# Patient Record
Sex: Female | Born: 1955
Health system: Southern US, Community
[De-identification: ages and names within clinical notes are randomized; demographics above are authoritative.]

## PROBLEM LIST (undated history)

## (undated) DIAGNOSIS — S93401D Sprain of unspecified ligament of right ankle, subsequent encounter: Secondary | ICD-10-CM

## (undated) DIAGNOSIS — I480 Paroxysmal atrial fibrillation: Secondary | ICD-10-CM

## (undated) DIAGNOSIS — K219 Gastro-esophageal reflux disease without esophagitis: Secondary | ICD-10-CM

## (undated) DIAGNOSIS — M549 Dorsalgia, unspecified: Secondary | ICD-10-CM

## (undated) DIAGNOSIS — E559 Vitamin D deficiency, unspecified: Secondary | ICD-10-CM

## (undated) DIAGNOSIS — R070 Pain in throat: Secondary | ICD-10-CM

## (undated) DIAGNOSIS — J329 Chronic sinusitis, unspecified: Secondary | ICD-10-CM

## (undated) DIAGNOSIS — J9801 Acute bronchospasm: Secondary | ICD-10-CM

## (undated) DIAGNOSIS — R030 Elevated blood-pressure reading, without diagnosis of hypertension: Secondary | ICD-10-CM

## (undated) DIAGNOSIS — R5383 Other fatigue: Secondary | ICD-10-CM

## (undated) DIAGNOSIS — Z780 Asymptomatic menopausal state: Secondary | ICD-10-CM

## (undated) DIAGNOSIS — R059 Cough, unspecified: Secondary | ICD-10-CM

## (undated) DIAGNOSIS — K589 Irritable bowel syndrome without diarrhea: Secondary | ICD-10-CM

## (undated) DIAGNOSIS — R1012 Left upper quadrant pain: Secondary | ICD-10-CM

## (undated) DIAGNOSIS — Z6823 Body mass index (BMI) 23.0-23.9, adult: Secondary | ICD-10-CM

## (undated) DIAGNOSIS — N840 Polyp of corpus uteri: Secondary | ICD-10-CM

## (undated) DIAGNOSIS — R05 Cough: Secondary | ICD-10-CM

## (undated) DIAGNOSIS — F419 Anxiety disorder, unspecified: Secondary | ICD-10-CM

## (undated) DIAGNOSIS — H9209 Otalgia, unspecified ear: Secondary | ICD-10-CM

## (undated) DIAGNOSIS — I499 Cardiac arrhythmia, unspecified: Secondary | ICD-10-CM

## (undated) DIAGNOSIS — R002 Palpitations: Secondary | ICD-10-CM

## (undated) DIAGNOSIS — J189 Pneumonia, unspecified organism: Secondary | ICD-10-CM

## (undated) DIAGNOSIS — M542 Cervicalgia: Secondary | ICD-10-CM

## (undated) DIAGNOSIS — K9049 Malabsorption due to intolerance, not elsewhere classified: Secondary | ICD-10-CM

## (undated) DIAGNOSIS — Z8719 Personal history of other diseases of the digestive system: Secondary | ICD-10-CM

## (undated) DIAGNOSIS — N340 Urethral abscess: Secondary | ICD-10-CM

## (undated) DIAGNOSIS — M940 Chondrocostal junction syndrome [Tietze]: Secondary | ICD-10-CM

## (undated) DIAGNOSIS — H269 Unspecified cataract: Secondary | ICD-10-CM

## (undated) DIAGNOSIS — K635 Polyp of colon: Secondary | ICD-10-CM

## (undated) DIAGNOSIS — R197 Diarrhea, unspecified: Secondary | ICD-10-CM

## (undated) DIAGNOSIS — N390 Urinary tract infection, site not specified: Secondary | ICD-10-CM

## (undated) HISTORY — DX: Polyp of colon: K63.5

## (undated) HISTORY — DX: Sprain of unspecified ligament of right ankle, subsequent encounter: S93.401D

## (undated) HISTORY — DX: Irritable bowel syndrome, unspecified: K58.9

## (undated) HISTORY — DX: Other fatigue: R53.83

## (undated) HISTORY — DX: Unspecified cataract: H26.9

## (undated) HISTORY — DX: Body mass index (BMI) 23.0-23.9, adult: Z68.23

## (undated) HISTORY — DX: Diarrhea, unspecified: R19.7

## (undated) HISTORY — DX: Gastro-esophageal reflux disease without esophagitis: K21.9

## (undated) HISTORY — DX: Polyp of corpus uteri: N84.0

## (undated) HISTORY — DX: Palpitations: R00.2

## (undated) HISTORY — DX: Pain in throat: R07.0

## (undated) HISTORY — DX: Urethral abscess: N34.0

## (undated) HISTORY — DX: Left upper quadrant pain: R10.12

## (undated) HISTORY — DX: Otalgia, unspecified ear: H92.09

## (undated) HISTORY — DX: Cervicalgia: M54.2

## (undated) HISTORY — DX: Urinary tract infection, site not specified: N39.0

## (undated) HISTORY — DX: Chondrocostal junction syndrome (tietze): M94.0

## (undated) HISTORY — DX: Vitamin D deficiency, unspecified: E55.9

## (undated) HISTORY — DX: Dorsalgia, unspecified: M54.9

## (undated) HISTORY — PX: COLONOSCOPY: SHX174

## (undated) HISTORY — DX: Malabsorption due to intolerance, not elsewhere classified: K90.49

## (undated) HISTORY — DX: Paroxysmal atrial fibrillation: I48.0

## (undated) HISTORY — DX: Chronic sinusitis, unspecified: J32.9

## (undated) HISTORY — DX: Asymptomatic menopausal state: Z78.0

## (undated) HISTORY — DX: Cough, unspecified: R05.9

## (undated) HISTORY — DX: Acute bronchospasm: J98.01

## (undated) HISTORY — DX: Elevated blood-pressure reading, without diagnosis of hypertension: R03.0

## (undated) HISTORY — PX: UPPER GI ENDOSCOPY: SHX6162

---
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
## 1978-10-02 HISTORY — PX: GYNECOLOGIC CRYOSURGERY: SHX857

## 2005-10-02 HISTORY — PX: OTHER SURGICAL HISTORY: SHX169

## 2007-02-11 ENCOUNTER — Encounter: Admission: RE | Admit: 2007-02-11 | Discharge: 2007-05-12 | Payer: Self-pay | Admitting: Anesthesiology

## 2007-02-12 ENCOUNTER — Ambulatory Visit: Payer: Self-pay | Admitting: Anesthesiology

## 2007-02-22 ENCOUNTER — Encounter
Admission: RE | Admit: 2007-02-22 | Discharge: 2007-05-23 | Payer: Self-pay | Admitting: Physical Medicine & Rehabilitation

## 2007-02-26 ENCOUNTER — Ambulatory Visit: Payer: Self-pay | Admitting: Physical Medicine & Rehabilitation

## 2007-02-26 ENCOUNTER — Ambulatory Visit (HOSPITAL_COMMUNITY): Admission: RE | Admit: 2007-02-26 | Discharge: 2007-02-26 | Payer: Self-pay | Admitting: Anesthesiology

## 2007-05-03 DIAGNOSIS — N840 Polyp of corpus uteri: Secondary | ICD-10-CM

## 2007-05-03 HISTORY — DX: Polyp of corpus uteri: N84.0

## 2007-05-03 HISTORY — PX: DILATION AND CURETTAGE OF UTERUS: SHX78

## 2007-05-23 ENCOUNTER — Ambulatory Visit (HOSPITAL_COMMUNITY): Admission: RE | Admit: 2007-05-23 | Discharge: 2007-05-23 | Payer: Self-pay | Admitting: Obstetrics and Gynecology

## 2007-05-23 ENCOUNTER — Encounter (INDEPENDENT_AMBULATORY_CARE_PROVIDER_SITE_OTHER): Payer: Self-pay | Admitting: Obstetrics and Gynecology

## 2007-08-02 ENCOUNTER — Emergency Department (HOSPITAL_COMMUNITY): Admission: EM | Admit: 2007-08-02 | Discharge: 2007-08-03 | Payer: Self-pay | Admitting: Emergency Medicine

## 2007-10-03 DIAGNOSIS — N340 Urethral abscess: Secondary | ICD-10-CM

## 2007-10-03 HISTORY — DX: Urethral abscess: N34.0

## 2008-03-04 ENCOUNTER — Emergency Department (HOSPITAL_COMMUNITY): Admission: EM | Admit: 2008-03-04 | Discharge: 2008-03-04 | Payer: Self-pay | Admitting: Family Medicine

## 2008-05-14 ENCOUNTER — Encounter
Admission: RE | Admit: 2008-05-14 | Discharge: 2008-05-18 | Payer: Self-pay | Admitting: Physical Medicine & Rehabilitation

## 2008-05-18 ENCOUNTER — Ambulatory Visit: Payer: Self-pay | Admitting: Physical Medicine & Rehabilitation

## 2008-06-11 ENCOUNTER — Encounter
Admission: RE | Admit: 2008-06-11 | Discharge: 2008-09-09 | Payer: Self-pay | Admitting: Physical Medicine & Rehabilitation

## 2008-07-22 ENCOUNTER — Ambulatory Visit: Payer: Self-pay | Admitting: Family Medicine

## 2008-08-25 ENCOUNTER — Ambulatory Visit: Payer: Self-pay | Admitting: Family Medicine

## 2008-08-25 DIAGNOSIS — E785 Hyperlipidemia, unspecified: Secondary | ICD-10-CM

## 2008-08-25 DIAGNOSIS — F329 Major depressive disorder, single episode, unspecified: Secondary | ICD-10-CM

## 2008-08-25 DIAGNOSIS — F32A Depression, unspecified: Secondary | ICD-10-CM | POA: Insufficient documentation

## 2008-08-25 DIAGNOSIS — F3289 Other specified depressive episodes: Secondary | ICD-10-CM

## 2008-08-25 HISTORY — DX: Hyperlipidemia, unspecified: E78.5

## 2008-08-25 HISTORY — DX: Other specified depressive episodes: F32.89

## 2008-08-25 HISTORY — DX: Major depressive disorder, single episode, unspecified: F32.9

## 2008-08-26 ENCOUNTER — Encounter (INDEPENDENT_AMBULATORY_CARE_PROVIDER_SITE_OTHER): Payer: Self-pay | Admitting: *Deleted

## 2008-08-26 LAB — CONVERTED CEMR LAB
BUN: 16 mg/dL (ref 6–23)
CO2: 30 meq/L (ref 19–32)
Calcium: 9.8 mg/dL (ref 8.4–10.5)
Chloride: 104 meq/L (ref 96–112)
Creatinine, Ser: 0.8 mg/dL (ref 0.4–1.2)
Eosinophils Relative: 2.4 % (ref 0.0–5.0)
GFR calc Af Amer: 96 mL/min
Hemoglobin: 13.5 g/dL (ref 12.0–15.0)
Lymphocytes Relative: 30.8 % (ref 12.0–46.0)
MCHC: 34.3 g/dL (ref 30.0–36.0)
Monocytes Relative: 9.1 % (ref 3.0–12.0)
Neutro Abs: 2.2 10*3/uL (ref 1.4–7.7)
Neutrophils Relative %: 56.9 % (ref 43.0–77.0)
Platelets: 290 10*3/uL (ref 150–400)
RBC: 3.98 M/uL (ref 3.87–5.11)
TSH: 0.73 microintl units/mL (ref 0.35–5.50)

## 2008-09-08 ENCOUNTER — Telehealth (INDEPENDENT_AMBULATORY_CARE_PROVIDER_SITE_OTHER): Payer: Self-pay | Admitting: *Deleted

## 2008-09-22 ENCOUNTER — Encounter (INDEPENDENT_AMBULATORY_CARE_PROVIDER_SITE_OTHER): Payer: Self-pay | Admitting: *Deleted

## 2008-09-22 LAB — CONVERTED CEMR LAB: Pap Smear: NORMAL

## 2008-10-07 ENCOUNTER — Ambulatory Visit: Payer: Self-pay | Admitting: Family Medicine

## 2008-10-07 DIAGNOSIS — D709 Neutropenia, unspecified: Secondary | ICD-10-CM | POA: Insufficient documentation

## 2008-10-07 HISTORY — DX: Neutropenia, unspecified: D70.9

## 2008-10-08 ENCOUNTER — Encounter (INDEPENDENT_AMBULATORY_CARE_PROVIDER_SITE_OTHER): Payer: Self-pay | Admitting: *Deleted

## 2008-10-08 LAB — CONVERTED CEMR LAB
Basophils Absolute: 0 10*3/uL (ref 0.0–0.1)
Eosinophils Absolute: 0.1 10*3/uL (ref 0.0–0.7)
Eosinophils Relative: 1.4 % (ref 0.0–5.0)
HCT: 37.5 % (ref 36.0–46.0)
Lymphocytes Relative: 22.4 % (ref 12.0–46.0)
MCHC: 34.7 g/dL (ref 30.0–36.0)
MCV: 97.8 fL (ref 78.0–100.0)
Monocytes Absolute: 0.4 10*3/uL (ref 0.1–1.0)
RBC: 3.83 M/uL — ABNORMAL LOW (ref 3.87–5.11)
RDW: 12 % (ref 11.5–14.6)
WBC: 6.3 10*3/uL (ref 4.5–10.5)

## 2008-10-21 ENCOUNTER — Ambulatory Visit (HOSPITAL_COMMUNITY): Payer: Self-pay | Admitting: Psychology

## 2009-03-31 ENCOUNTER — Encounter: Admission: RE | Admit: 2009-03-31 | Discharge: 2009-05-03 | Payer: Self-pay | Admitting: Orthopedic Surgery

## 2009-04-12 ENCOUNTER — Ambulatory Visit: Payer: Self-pay | Admitting: Sports Medicine

## 2009-04-12 DIAGNOSIS — M25559 Pain in unspecified hip: Secondary | ICD-10-CM

## 2009-04-12 HISTORY — DX: Pain in unspecified hip: M25.559

## 2009-05-20 ENCOUNTER — Ambulatory Visit: Payer: Self-pay | Admitting: Family Medicine

## 2009-05-20 DIAGNOSIS — J019 Acute sinusitis, unspecified: Secondary | ICD-10-CM

## 2009-05-20 HISTORY — DX: Acute sinusitis, unspecified: J01.90

## 2009-05-28 ENCOUNTER — Telehealth (INDEPENDENT_AMBULATORY_CARE_PROVIDER_SITE_OTHER): Payer: Self-pay | Admitting: *Deleted

## 2009-06-21 ENCOUNTER — Ambulatory Visit: Payer: Self-pay | Admitting: Family Medicine

## 2009-06-21 DIAGNOSIS — J309 Allergic rhinitis, unspecified: Secondary | ICD-10-CM | POA: Insufficient documentation

## 2009-06-21 HISTORY — DX: Allergic rhinitis, unspecified: J30.9

## 2009-10-05 ENCOUNTER — Encounter: Payer: Self-pay | Admitting: Internal Medicine

## 2009-11-11 ENCOUNTER — Ambulatory Visit: Payer: Self-pay | Admitting: Family Medicine

## 2009-11-19 ENCOUNTER — Encounter: Admission: RE | Admit: 2009-11-19 | Discharge: 2009-11-19 | Payer: Self-pay | Admitting: Family Medicine

## 2009-11-19 ENCOUNTER — Ambulatory Visit: Payer: Self-pay | Admitting: Family Medicine

## 2009-11-19 DIAGNOSIS — J984 Other disorders of lung: Secondary | ICD-10-CM | POA: Insufficient documentation

## 2009-11-19 DIAGNOSIS — R930 Abnormal findings on diagnostic imaging of skull and head, not elsewhere classified: Secondary | ICD-10-CM | POA: Insufficient documentation

## 2009-11-19 HISTORY — DX: Abnormal findings on diagnostic imaging of skull and head, not elsewhere classified: R93.0

## 2009-11-19 HISTORY — DX: Other disorders of lung: J98.4

## 2009-11-21 ENCOUNTER — Emergency Department (HOSPITAL_COMMUNITY): Admission: EM | Admit: 2009-11-21 | Discharge: 2009-11-21 | Payer: Self-pay | Admitting: Family Medicine

## 2009-12-06 ENCOUNTER — Ambulatory Visit: Payer: Self-pay | Admitting: Family

## 2009-12-06 LAB — CONVERTED CEMR LAB
ALT: 35 units/L (ref 0–35)
Albumin: 4.5 g/dL (ref 3.5–5.2)
BUN: 12 mg/dL (ref 6–23)
Basophils Relative: 1.3 % (ref 0.0–3.0)
Bilirubin, Direct: 0.1 mg/dL (ref 0.0–0.3)
Cholesterol: 191 mg/dL (ref 0–200)
Creatinine, Ser: 0.9 mg/dL (ref 0.4–1.2)
Eosinophils Relative: 1.2 % (ref 0.0–5.0)
GFR calc non Af Amer: 69.19 mL/min (ref >60)
Glucose, Bld: 84 mg/dL (ref 70–99)
LDL Cholesterol: 76 mg/dL (ref 0–99)
Lymphs Abs: 1.5 10*3/uL (ref 0.7–4.0)
Monocytes Relative: 5.4 % (ref 3.0–12.0)
Neutro Abs: 4.2 10*3/uL (ref 1.4–7.7)
Neutrophils Relative %: 67.7 % (ref 43.0–77.0)
RDW: 12.4 % (ref 11.5–14.6)
TSH: 0.47 microintl units/mL (ref 0.35–5.50)
Total Bilirubin: 0.6 mg/dL (ref 0.3–1.2)
Total Protein: 8 g/dL (ref 6.0–8.3)
Triglycerides: 87 mg/dL (ref 0.0–149.0)
WBC: 6.2 10*3/uL (ref 4.5–10.5)

## 2009-12-07 ENCOUNTER — Telehealth: Payer: Self-pay | Admitting: Family

## 2009-12-07 ENCOUNTER — Encounter: Payer: Self-pay | Admitting: Family Medicine

## 2009-12-07 DIAGNOSIS — R7402 Elevation of levels of lactic acid dehydrogenase (LDH): Secondary | ICD-10-CM | POA: Insufficient documentation

## 2009-12-07 DIAGNOSIS — R7401 Elevation of levels of liver transaminase levels: Secondary | ICD-10-CM | POA: Insufficient documentation

## 2009-12-07 DIAGNOSIS — R740 Nonspecific elevation of levels of transaminase and lactic acid dehydrogenase [LDH]: Secondary | ICD-10-CM

## 2009-12-07 HISTORY — DX: Elevation of levels of liver transaminase levels: R74.01

## 2010-01-11 ENCOUNTER — Ambulatory Visit: Payer: Self-pay | Admitting: Family Medicine

## 2010-07-18 ENCOUNTER — Ambulatory Visit: Payer: Self-pay | Admitting: Emergency Medicine

## 2010-07-27 ENCOUNTER — Ambulatory Visit: Payer: Self-pay | Admitting: Pediatrics

## 2010-08-29 ENCOUNTER — Telehealth (INDEPENDENT_AMBULATORY_CARE_PROVIDER_SITE_OTHER): Payer: Self-pay | Admitting: *Deleted

## 2010-10-31 ENCOUNTER — Encounter: Payer: Self-pay | Admitting: Family Medicine

## 2010-11-01 NOTE — Assessment & Plan Note (Signed)
Summary: FEVER & COUGH/KH   Vital Signs:  Patient Profile:   55 Years Old Female CC:      Cold & URI symptoms Height:     64 inches Weight:      140 pounds O2 Sat:      100 % O2 treatment:    Room Air Temp:     97.0 degrees F oral Pulse rate:   61 / minute Pulse rhythm:   regular Resp:     16 per minute BP sitting:   129 / 77  (right arm) Cuff size:   regular  Pt. in pain?   no  Vitals Entered By: Lita Mains, RN                   Updated Prior Medication List: MULTI FOR HER 50+  TABS (MULTIPLE VITAMINS-MINERALS) 1 qd CALCIUM 500/D 500-200 MG-UNIT TABS (CALCIUM CARBONATE-VITAMIN D) 1 qd B-100 COMPLEX  TABS (VITAMINS-LIPOTROPICS) 1 qd CITALOPRAM HYDROBROMIDE 10 MG TABS (CITALOPRAM HYDROBROMIDE) take one tablet daily CLARITIN 10 MG TABS (LORATADINE) once daily  Current Allergies (reviewed today): No known allergies History of Present Illness Chief Complaint: Cold & URI symptoms History of Present Illness: ONSET MONDAY WITH COLD SYMPTOMS. COUGH WORSE AT NIGHT. FEVER LOW GRADE FEVER/ COUGH IS DRY. TAKING MUCINEX AND TYLENOL. SYMPTOMS SEEM TO BE GETTING WORSE. HAS RUNNY NOSE.   REVIEW OF SYSTEMS Constitutional Symptoms       Complains of chills.     Denies fever, night sweats, weight loss, weight gain, and fatigue.  Eyes       Denies change in vision, eye pain, eye discharge, glasses, contact lenses, and eye surgery. Ear/Nose/Throat/Mouth       Complains of frequent runny nose, sinus problems, and sore throat.      Denies hearing loss/aids, change in hearing, ear pain, ear discharge, dizziness, frequent nose bleeds, hoarseness, and tooth pain or bleeding.      Comments: sinus pressure Respiratory       Complains of productive cough and shortness of breath.      Denies dry cough, wheezing, asthma, bronchitis, and emphysema/COPD.      Comments: yellow, SOB with exertion Cardiovascular       Denies murmurs, chest pain, and tires easily with exhertion.     Gastrointestinal       Denies stomach pain, nausea/vomiting, diarrhea, constipation, blood in bowel movements, and indigestion. Genitourniary       Denies painful urination, kidney stones, and loss of urinary control. Neurological       Denies paralysis, seizures, and fainting/blackouts. Musculoskeletal       Denies muscle pain, joint pain, joint stiffness, decreased range of motion, redness, swelling, muscle weakness, and gout.  Skin       Denies bruising, unusual mles/lumps or sores, and hair/skin or nail changes.  Psych       Denies mood changes, temper/anger issues, anxiety/stress, speech problems, depression, and sleep problems. Other Comments: Taken tylenol and Mucinex   Past History:  Past Medical History: Reviewed history from 08/25/2008 and no changes required. Depression GYN- Dr Arelia Sneddon, annual exams in Dec  Past Surgical History: D&C-2009 Uterine Artery Embolization  Family History: Reviewed history from 07/22/2008 and no changes required. 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 CONGESTED COUGH Heart: regular rate and  rhythm, no murmur Skin: no obvious rashes or lesions Assessment New Problems: UPPER RESPIRATORY INFECTION, ACUTE (ICD-465.9)   Plan New Medications/Changes: TESSALON 200 MG CAPS (BENZONATATE) 1 by mouth Q 12 HRS as needed COUGH  #14 x 0, 11/11/2009, Kimberly Lykins DO CHERATUSSIN AC 100-10 MG/5ML SYRP (GUAIFENESIN-CODEINE)  1-2 TSP by mouth Q 6 HRS as needed COUGH. TAKE MAILY AT NIGHT DUE TO DROWSINESS  #4 OZ x 0, 11/11/2009, Kimberly Lykins DO ZITHROMAX Z-PAK 250 MG TABS (AZITHROMYCIN) TAKE AS DIRECTED  #1 PK x 0, 11/11/2009, Marvis Moeller DO  New Orders: Est. Patient Level III [45409]   Prescriptions: TESSALON 200 MG CAPS (BENZONATATE) 1 by mouth Q 12 HRS as needed COUGH  #14 x 0   Entered and Authorized by:   Marvis Moeller DO   Signed by:   Marvis Moeller DO on 11/11/2009   Method used:   Print then Give to Patient   RxID:   [id number] CHERATUSSIN AC 100-10 MG/5ML SYRP (GUAIFENESIN-CODEINE) 1-2 TSP by mouth Q 6 HRS as needed COUGH. TAKE MAILY AT NIGHT DUE TO DROWSINESS  #4 OZ x 0   Entered and Authorized by:   Marvis Moeller DO   Signed by:   Marvis Moeller DO on 11/11/2009   Method used:   Print then Give to Patient   RxID:   [id number] ZITHROMAX Z-PAK 250 MG TABS (AZITHROMYCIN) TAKE AS DIRECTED  #1 PK x 0   Entered and Authorized by:   Marvis Moeller DO   Signed by:   Marvis Moeller DO on 11/11/2009   Method used:   Print then Give to Patient   RxID:   [id number]   Patient Instructions: 1)  TYLENOL OR MOTRIN AS NEEDED. AVOID CAFFEINE AND MILK PRODUCTS. CONT MUCINEX. FOLLOW UP WITH YOUR PCP OR RETURN IF SYMPTOMS PERIST OR WORSEN

## 2010-11-01 NOTE — Assessment & Plan Note (Signed)
Summary: ? SINUS INFECTION/RH......   Vital Signs:  Patient profile:   55 year old female Weight:      138 pounds Temp:     98.1 degrees F oral Pulse rate:   66 / minute Pulse rhythm:   regular BP sitting:   114 / 70  (left arm) Cuff size:   regular  Vitals Entered By: Army Fossa CMA (January 11, 2010 2:34 PM) CC: Pt here for head, nasal congestion since Sunday. Has not tried anything OTC., URI symptoms   History of Present Illness:       This is a 55 year old woman who presents with URI symptoms.  The symptoms began 3 days ago.  The patient complains of nasal congestion, but denies sore throat, dry cough, productive cough, and earache.  The patient denies fever, low-grade fever (<100.5 degrees), fever of 100.5-103 degrees, fever of 103.1-104 degrees, fever to >104 degrees, stiff neck, dyspnea, wheezing, rash, vomiting, diarrhea, use of an antipyretic, and response to antipyretic.  The patient also reports itchy watery eyes, itchy throat, and sneezing.  The patient denies seasonal symptoms, response to antihistamine, headache, muscle aches, and severe fatigue.  The patient denies the following risk factors for Strep sinusitis: unilateral facial pain, unilateral nasal discharge, poor response to decongestant, double sickening, tooth pain, Strep exposure, tender adenopathy, and absence of cough.    Current Medications (verified): 1)  Multi For Her 50+  Tabs (Multiple Vitamins-Minerals) .Marland Kitchen.. 1 Qd 2)  Calcium 500/d 500-200 Mg-Unit Tabs (Calcium Carbonate-Vitamin D) .Marland Kitchen.. 1 Qd 3)  B-100 Complex  Tabs (Vitamins-Lipotropics) .Marland Kitchen.. 1 Qd 4)  Citalopram Hydrobromide 20 Mg Tabs (Citalopram Hydrobromide) .... One Tablet By Mouth Daily 5)  Claritin 10 Mg Tabs (Loratadine) .... Once Daily 6)  Augmentin 875-125 Mg Tabs (Amoxicillin-Pot Clavulanate) .Marland Kitchen.. 1 By Mouth Two Times A Day  Allergies (verified): No Known Drug Allergies  Past History:  Past medical, surgical, family and social histories  (including risk factors) reviewed for relevance to current acute and chronic problems.  Past Medical History: Reviewed history from 08/25/2008 and no changes required. Depression GYN- Dr Arelia Sneddon, annual exams in Dec  Past Surgical History: Reviewed history from 11/11/2009 and no changes required. D&C-2009 Uterine Artery Embolization  Family History: Reviewed history from 07/22/2008 and no changes required. 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  Otoscopic examination reveals clear canals, tympanic membranes are intact bilaterally without bulging, retraction, inflammation or discharge. Hearing is grossly normal bilaterally. Nose:  external erythema, L frontal sinus tenderness, L maxillary sinus tenderness, R frontal sinus tenderness, and R maxillary sinus tenderness.   Mouth:  Oral mucosa and oropharynx without lesions or exudates.  Teeth in good repair. Neck:  No deformities, masses, or tenderness noted. Lungs:  Normal respiratory effort, chest expands symmetrically. Lungs are clear to auscultation, no crackles or wheezes. Heart:  normal rate and no murmur.   Skin:  Intact without suspicious lesions or rashes Cervical Nodes:  No lymphadenopathy noted Psych:  Cognition and judgment appear intact. Alert and cooperative with normal attention span and concentration. No apparent delusions, illusions, hallucinations   Impression & Recommendations:  Problem # 1:  ACUTE SINUSITIS,  UNSPECIFIED (ICD-461.9)  Her updated medication list for this problem includes:    Augmentin  875-125 Mg Tabs (Amoxicillin-pot clavulanate) .Marland Kitchen... 1 by mouth two times a day  Instructed on treatment. Call if symptoms persist or worsen.   Orders: Admin of Therapeutic Inj  intramuscular or subcutaneous (13086) Depo- Medrol 80mg  (J1040)  Complete Medication List: 1)  Multi For Her 50+ Tabs (Multiple vitamins-minerals) .Marland Kitchen.. 1 qd 2)  Calcium 500/d 500-200 Mg-unit Tabs (Calcium carbonate-vitamin d) .Marland Kitchen.. 1 qd 3)  B-100 Complex Tabs (Vitamins-lipotropics) .Marland Kitchen.. 1 qd 4)  Citalopram Hydrobromide 20 Mg Tabs (Citalopram hydrobromide) .... One tablet by mouth daily 5)  Claritin 10 Mg Tabs (Loratadine) .... Once daily 6)  Augmentin 875-125 Mg Tabs (Amoxicillin-pot clavulanate) .Marland Kitchen.. 1 by mouth two times a day Prescriptions: AUGMENTIN 875-125 MG TABS (AMOXICILLIN-POT CLAVULANATE) 1 by mouth two times a day  #20 x 0   Entered and Authorized by:   Loreen Freud DO   Signed by:   Loreen Freud DO on 01/11/2010   Method used:   Electronically to        [facility]* (retail)       [address]       [address] Shipping/mailing       [place], [place]  [address]       Ph: [phone]       Fax: [phone]   RxID:   [id number]    Medication Administration  Injection # 1:    Medication: Depo- Medrol 80mg     Diagnosis: ACUTE SINUSITIS, UNSPECIFIED (ICD-461.9)    Route: IM    Site: RUOQ gluteus    Exp Date: 08/2010    Lot #: [id number]    Mfr: [email or web address]    Patient tolerated injection without complications    Given by: Army Fossa CMA (January 11, 2010 3:10 PM)  Orders Added: 1)  Est. Patient Level III [87564] 2)  Admin of Therapeutic Inj  intramuscular or subcutaneous [96372] 3)  Depo- Medrol 80mg  [J1040]

## 2010-11-01 NOTE — Letter (Signed)
Summary: [facility]  [facility]   Imported By: Lanelle Bal 11/03/2009 10:11:37  _____________________________________________________________________  External Attachment:    Type:   Image     Comment:   External Document

## 2010-11-01 NOTE — Assessment & Plan Note (Signed)
Summary: URI? NH   Vital Signs:  Patient Profile:   55 Years Old Female CC:      Congestion, runny nose, exhaustion, sinus pressure, ears achy x 1 week Height:     64 inches Weight:      130 pounds O2 Sat:      100 % O2 treatment:    Room Air Temp:     99.1 degrees F oral Pulse rate:   69 / minute Pulse rhythm:   regular Resp:     16 per minute BP sitting:   121 / 80  (left arm) Cuff size:   regular  Vitals Entered By: Emilio Math (July 18, 2010 11:51 AM)                  Current Allergies: No known allergies History of Present Illness History from: patient Chief Complaint: Congestion, runny nose, exhaustion, sinus pressure, ears achy x 1 week History of Present Illness: Patient complains of onset of cold symptoms for 1 week.  They have been using no OTC meds  She was getting a little better, then got worse again with facial pressure.  She works around physicians & patients all day.  She is also asking for Flonase refill No sore throat No cough No pleuritic pain No wheezing + nasal congestion No post-nasal drainage + sinus pain/pressure No itchy/red eyes No earache No hemoptysis No SOB No chills/sweats No fever No nausea No vomiting No abdominal pain No diarrhea No skin rashes No fatigue No myalgias + headache   REVIEW OF SYSTEMS Constitutional Symptoms      Denies fever, chills, night sweats, weight loss, weight gain, and fatigue.  Eyes       Denies change in vision, eye pain, eye discharge, glasses, contact lenses, and eye surgery. Ear/Nose/Throat/Mouth       Complains of ear pain, frequent runny nose, sinus problems, and sore throat.      Denies hearing loss/aids, change in hearing, ear discharge, dizziness, frequent nose bleeds, hoarseness, and tooth pain or bleeding.  Respiratory       Denies dry cough, productive cough, wheezing, shortness of breath, asthma, bronchitis, and emphysema/COPD.  Cardiovascular       Denies murmurs, chest pain, and  tires easily with exhertion.    Gastrointestinal       Denies stomach pain, nausea/vomiting, diarrhea, constipation, blood in bowel movements, and indigestion. Genitourniary       Denies painful urination, kidney stones, and loss of urinary control. Neurological       Complains of headaches.      Denies paralysis, seizures, and fainting/blackouts. Musculoskeletal       Denies muscle pain, joint pain, joint stiffness, decreased range of motion, redness, swelling, muscle weakness, and gout.  Skin       Denies bruising, unusual mles/lumps or sores, and hair/skin or nail changes.  Psych       Denies mood changes, temper/anger issues, anxiety/stress, speech problems, depression, and sleep problems.  Past History:  Past Medical History: Reviewed history from 08/25/2008 and no changes required. Depression GYN- Dr Arelia Sneddon, annual exams in Dec  Past Surgical History: Reviewed history from 11/11/2009 and no changes required. D&C-2009 Uterine Artery Embolization  Family History: Reviewed history from 07/22/2008 and no changes required. 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 Regular exercise-yes Physical Exam General appearance: well developed, well nourished, no acute  distress Head: maxillary & ethmoid sinus tenderness Ears: normal, no lesions or deformities Nasal: mucosa pink, nonedematous, no septal deviation, turbinates normal Oral/Pharynx: tongue normal, posterior pharynx without erythema or exudate Neck: neck supple,  trachea midline, no masses Chest/Lungs: no rales, wheezes, or rhonchi bilateral, breath sounds equal without effort Heart: regular rate and  rhythm, no murmur Skin: no obvious rashes or lesions MSE: oriented to time, place, and person Assessment New Problems: SINUSITIS, ACUTE (ICD-461.9)   Patient  Education: Patient and/or caregiver instructed in the following: rest, fluids, Tylenol prn.  Plan New Medications/Changes: FLONASE 50 MCG/ACT SUSP (FLUTICASONE PROPIONATE) 2 sprays each notril QAM  #1 bottle x 0, 07/18/2010, Hoyt Koch MD AMOXICILLIN 875 MG TABS (AMOXICILLIN) 1 tab by mouth two times a day for 10 days  #20 x 0, 07/18/2010, Hoyt Koch MD  New Orders: Est. Patient Level II [21308] Planning Comments:   1)  Take the prescribed antibiotic as instructed. 2)  Use nasal saline solution (over the counter) at least 3 times a day. 3)  Use over the counter decongestants like Zyrtec-D every 12 hours as needed to help with congestion. 4)  Can take tylenol every 6 hours or motrin every 8 hours for pain or fever. 5)  Follow up with your primary doctor  if no improvement in 5-7 days, sooner if increasing pain, fever, or new symptoms.   The patient and/or caregiver has been counseled thoroughly with regard to medications prescribed including dosage, schedule, interactions, rationale for use, and possible side effects and they verbalize understanding.  Diagnoses and expected course of recovery discussed and will return if not improved as expected or if the condition worsens. Patient and/or caregiver verbalized understanding.  Prescriptions: FLONASE 50 MCG/ACT SUSP (FLUTICASONE PROPIONATE) 2 sprays each notril QAM  #1 bottle x 0   Entered and Authorized by:   Hoyt Koch MD   Signed by:   Hoyt Koch MD on 07/18/2010   Method used:   Print then Give to Patient   RxID:   [id number] AMOXICILLIN 875 MG TABS (AMOXICILLIN) 1 tab by mouth two times a day for 10 days  #20 x 0   Entered and Authorized by:   Hoyt Koch MD   Signed by:   Hoyt Koch MD on 07/18/2010   Method used:   Print then Give to Patient   RxID:   [id number]   Orders Added: 1)  Est. Patient Level II [34742]

## 2010-11-01 NOTE — Assessment & Plan Note (Signed)
Summary: CONGESTIN AND SINUS PRESSURE   Vital Signs:  Patient Profile:   55 Years Old Female CC:      sinus pressure, congestion  Height:     64 inches Weight:      130 pounds O2 Sat:      100 % O2 treatment:    Room Air Temp:     97.9 degrees F oral Pulse rate:   62 / minute Resp:     16 per minute BP sitting:   116 / 80  (left arm) Cuff size:   regular  Pt. in pain?   no  Vitals Entered By: Lajean Saver RN (July 27, 2010 11:39 AM)                   Updated Prior Medication List: MULTI FOR HER 50+  TABS (MULTIPLE VITAMINS-MINERALS) 1 qd CALCIUM 500/D 500-200 MG-UNIT TABS (CALCIUM CARBONATE-VITAMIN D) 1 qd B-100 COMPLEX  TABS (VITAMINS-LIPOTROPICS) 1 qd LEXAPRO 10 MG TABS (ESCITALOPRAM OXALATE)  FLONASE 50 MCG/ACT SUSP (FLUTICASONE PROPIONATE) 2 sprays each notril QAM  Current Allergies: No known allergies History of Present Illness History from: patient Chief Complaint: sinus pressure, congestion  History of Present Illness: Patient complains of onset of cold symptoms for 2-3 weeks.  She was just here last week, given Amox + Flonase and has been taking those.  Not better but not worse.   No sore throat + cough No pleuritic pain No wheezing No nasal congestion + post-nasal drainage + sinus pain/pressure No itchy/red eyes No earache No hemoptysis No SOB No chills/sweats No fever No nausea No vomiting No abdominal pain No diarrhea No skin rashes No fatigue No myalgias No headache   REVIEW OF SYSTEMS Constitutional Symptoms      Denies fever, chills, night sweats, weight loss, weight gain, and fatigue.  Eyes       Denies change in vision, eye pain, eye discharge, glasses, contact lenses, and eye surgery. Ear/Nose/Throat/Mouth       Complains of sinus problems.      Denies hearing loss/aids, change in hearing, ear pain, ear discharge, dizziness, frequent runny nose, frequent nose bleeds, sore throat, hoarseness, and tooth pain or bleeding.       Comments: congestion Respiratory       Complains of shortness of breath.      Denies dry cough, productive cough, wheezing, asthma, bronchitis, and emphysema/COPD.  Cardiovascular       Denies murmurs, chest pain, and tires easily with exhertion.    Gastrointestinal       Denies stomach pain, nausea/vomiting, diarrhea, constipation, blood in bowel movements, and indigestion. Genitourniary       Denies painful urination, kidney stones, and loss of urinary control. Neurological       Complains of headaches.      Denies paralysis, seizures, and fainting/blackouts. Musculoskeletal       Denies muscle pain, joint pain, joint stiffness, decreased range of motion, redness, swelling, muscle weakness, and gout.  Skin       Denies bruising, unusual mles/lumps or sores, and hair/skin or nail changes.  Psych       Denies mood changes, temper/anger issues, anxiety/stress, speech problems, depression, and sleep problems.  Past History:  Past Medical History: Reviewed history from 08/25/2008 and no changes required. Depression GYN- Dr Arelia Sneddon, annual exams in Dec  Past Surgical History: Reviewed history from 11/11/2009 and no changes required. D&C-2009 Uterine Artery Embolization  Family History: Reviewed history from 07/22/2008 and no changes  required. 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 Regular exercise-yes Physical Exam General appearance: well developed, well nourished, no acute distress Ears: normal, no lesions or deformities Nasal: clear discharge Oral/Pharynx: clera PND Chest/Lungs: no rales, wheezes, or rhonchi bilateral, breath sounds equal without effort Heart: regular rate and  rhythm, no murmur Skin: no obvious rashes or lesions MSE: oriented to time, place, and person Assessment New Problems: ALLERGIC  RHINITIS (ICD-477.9) UPPER RESPIRATORY INFECTION, ACUTE (ICD-465.9)  Chronic sinusitis vs Allergic rhinitis vs Continued URI  Patient Education: Patient and/or caregiver instructed in the following: rest, fluids.  Plan New Medications/Changes: PREDNISONE (PAK) 10 MG TABS (PREDNISONE) use as directed (6 day pack)  #1 x 0, 07/27/2010, Hoyt Koch MD AUGMENTIN [phone] MG TABS (AMOXICILLIN-POT CLAVULANATE) 1 tab by mouth two times a day for 10 days  #20 x 0, 07/27/2010, Hoyt Koch MD  New Orders: Est. Patient Level III [phone] Planning Comments:   1)  Take the prescribed antibiotic as instructed. 2)  Use nasal saline solution (over the counter) at least 3 times a day. 3)  Use over the counter decongestants like Zyrtec-D every 12 hours as needed to help with congestion. 4) Can take tylenol every 6 hours or motrin every 8 hours for pain or fever. 5) If not better after this treatment, please see your ENT.  Patient states that she will make an appt with [facility] today.   The patient and/or caregiver has been counseled thoroughly with regard to medications prescribed including dosage, schedule, interactions, rationale for use, and possible side effects and they verbalize understanding.  Diagnoses and expected course of recovery discussed and will return if not improved as expected or if the condition worsens. Patient and/or caregiver verbalized understanding.  Prescriptions: PREDNISONE (PAK) 10 MG TABS (PREDNISONE) use as directed (6 day pack)  #1 x 0   Entered and Authorized by:   Hoyt Koch MD   Signed by:   Hoyt Koch MD on 07/27/2010   Method used:   Print then Give to Patient   RxID:   [id number] AUGMENTIN 875-125 MG TABS (AMOXICILLIN-POT CLAVULANATE) 1 tab by mouth two times a day for 10 days  #20 x 0   Entered and Authorized by:   Hoyt Koch MD   Signed by:   Hoyt Koch MD on 07/27/2010   Method used:   Print then Give to Patient    RxID:   [id number]   Orders Added: 1)  Est. Patient Level III [36644]
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
## 2010-11-01 NOTE — Assessment & Plan Note (Signed)
Summary: cpx//pt will be fasting//lch   Vital Signs:  Patient profile:   55 year old female Weight:      140.25 pounds BMI:     24.16 Temp:     97.1 degrees F oral Pulse rate:   60 / minute Pulse rhythm:   regular Resp:     12 per minute BP sitting:   130 / 80  (left arm) Cuff size:   regular  Vitals Entered By: Mervin Kung CMA (December 06, 2009 9:57 AM) CC: room 16 annual physical Is Patient Diabetic? No   CC:  room 16 annual physical.  History of Present Illness: Kara Warren is a 55 year old female who presents today for a complete physical.  Preventative-  [facility] shed[facility].  Up to date on colo, Pap, immunizations.  Was recently treated for influenza.  Now starting to feel better.  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  Eats a healthy diet.  Depression-  uses citalopram for night sweats per gyn. Denies symptoms of depression  Preventive Screening-Counseling & Management  Alcohol-Tobacco     Smoking Status: never  Caffeine-Diet-Exercise     Caffeine use/day: 2 cups coffee daily     Does Patient Exercise: yes     Type of exercise: run/walk, pilates     Exercise (avg: min/session): 30-60     Times/week: <3  Allergies (verified): No Known Drug Allergies  Past History:  Past Medical History: Last updated: 08/25/2008 Depression GYN- Dr Arelia Sneddon, annual exams in Dec  Past Surgical History: Last updated: 11/11/2009 D&C-2009 Uterine Artery Embolization  Family History: Last updated: 07/22/2008 Mother, Alive, Healthy Father, Alive, Heart Disease  Social History: Last updated: 07/22/2008 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 Resp: Denies cough CV:  Denies Chest Pain or SOB GI:  Denies nausea or vomitting GU: Denies dysuria Lymphatic: Denies lymphadenopathy Musculoskeletal:  Denies muscle/joint pain Skin:  Denies Rashes- sees derm Q17mos for screening Psychiatric: Denies depression symptoms or anxiety Neuro: Denies numbness     Physical Exam  General:  Well-developed,well-nourished,in no acute distress; alert,appropriate and cooperative throughout examination Head:  Normocephalic and atraumatic without obvious abnormalities. No apparent alopecia or balding. Eyes:  PERRLA Ears:  External ear exam shows no significant lesions or deformities.  Otoscopic examination reveals clear canals, tympanic membranes are intact bilaterally without bulging, retraction, inflammation or discharge. Hearing is grossly normal bilaterally. Mouth:  Oral mucosa and oropharynx without lesions or exudates.  Teeth in good repair. Neck:  No deformities, masses, or tenderness noted. Breasts:  deferred to GYN Lungs:  Normal respiratory effort, chest expands symmetrically. Lungs are clear to auscultation, no crackles or wheezes. Heart:  Normal rate and regular rhythm. S1 and S2 normal without gallop, murmur, click, rub or other extra sounds. Abdomen:  Bowel sounds positive,abdomen soft and non-tender without masses, organomegaly or hernias noted. Genitalia:  deferred to GYN Msk:  No deformity or scoliosis noted of thoracic or lumbar spine.   Extremities:  No clubbing, cyanosis, edema, or deformity noted with normal full range of motion of all joints.   Neurologic:  alert & oriented X3, cranial nerves II-XII intact,  strength normal in all extremities, gait normal, and DTRs symmetrical and normal.     Impression & Recommendations:  Problem # 1:  Preventive Health Care (ICD-V70.0) Assessment Comment Only Patient exercises  regularly and eats healthy.  Pt to schedule mammo- otherwise up to date on healthcare maintenence.  Immunizations reviewed and up to date.  Will check basic lab work.  EKG sinus brady.    Problem # 2:  DEPRESSION (ICD-311) Assessment: Improved Stable- recent increase in citalopram by GYN for night sweats.   Her updated medication list for this problem includes:    Citalopram Hydrobromide 20 Mg Tabs (Citalopram hydrobromide) ..... One tablet by mouth daily  Complete Medication List: 1)  Multi For Her 50+ Tabs (Multiple vitamins-minerals) .Marland Kitchen.. 1 qd 2)  Calcium 500/d 500-200 Mg-unit Tabs (Calcium carbonate-vitamin d) .Marland Kitchen.. 1 qd 3)  B-100 Complex Tabs (Vitamins-lipotropics) .Marland Kitchen.. 1 qd 4)  Citalopram Hydrobromide 20 Mg Tabs (Citalopram hydrobromide) .... One tablet by mouth daily 5)  Claritin 10 Mg Tabs (Loratadine) .... Once daily  Other Orders: Venipuncture (60454) TLB-Lipid Panel (80061-LIPID) TLB-BMP (Basic Metabolic Panel-BMET) (80048-METABOL) TLB-CBC Platelet - w/Differential (85025-CBCD) TLB-Hepatic/Liver Function Pnl (80076-HEPATIC) TLB-TSH (Thyroid Stimulating Hormone) (09811-BJY)  Patient Instructions: 1)  Please schedule a follow-up appointment in 1 year. 2)  Sooner if problems or concerns.   Preventive Care Screening  Bone Density:    Date:  11/02/2009    Results:  normal std dev  Pap Smear:    Date:  11/02/2009    Results:  normal     Immunization History:  Influenza Immunization History:    Influenza:  historical (07/22/2009)   Current Allergies (reviewed today): No known allergies

## 2010-11-01 NOTE — Progress Notes (Signed)
Summary: lab abnormal  Phone Note Call from Patient Call back at Work Phone [phone]   Caller: Patient Summary of Call: PT states that on recent health screen her values were  Lymphocyte 46, granulocytes  42. Pt would like to know if she needs to have values repeated and if she needs to be worried....Marland KitchenMarland KitchenFelecia Deloach CMA  August 29, 2010 3:49 PM   Follow-up for Phone Call        this is nothing to worry about.  at [facility] the normal reference range for lymphocytes includes 46- so that is fine.  the slightly low granulocytes and upper end of lymphocytes likely represents a mild viral illness.  no follow up is required. Follow-up by: Neena Rhymes MD,  August 30, 2010 9:01 AM  Additional Follow-up for Phone Call Additional follow up Details #1::        left message to call office..............Marland KitchenFelecia Deloach CMA  August 30, 2010 9:43 AM   Pt aware....................Marland KitchenFelecia Deloach CMA  August 30, 2010 11:51 AM

## 2010-11-01 NOTE — Progress Notes (Signed)
Summary: lab result, needs appt.--lm  Phone Note Outgoing Call   Summary of Call: Please arrange a follow up lab draw- fasting AST/ALT in 12 weeks. Her AST is slightly elevated. Initial call taken by: Lemont Fillers FNP,  December 07, 2009 9:31 AM  Follow-up for Phone Call        Left message on home and wrk phones to return call. Follow-up by: Mervin Kung CMA,  December 07, 2009 2:46 PM  Additional Follow-up for Phone Call Additional follow up Details #1::        Pt notified of labs.  Fasting appt. scheduled for AST/ALT @ [facility] for 03/04/10 @ 8 a.m.  Pt voices understanding. Additional Follow-up by: Mervin Kung CMA,  December 07, 2009 3:14 PM  New Problems: TRANSAMINASES, SERUM, ELEVATED (ICD-790.4)   New Problems: TRANSAMINASES, SERUM, ELEVATED (ICD-790.4)

## 2010-11-01 NOTE — Letter (Signed)
   [facility] [address] [place], [place] [address] [phone]    December 06, 2009   Kinslea Crampton [address] [place], [place] [address]  RE:  LAB RESULTS  Dear  Ms. Ammons,  The following is an interpretation of your most recent lab tests.  Please take note of any instructions provided or changes to medications that have resulted from your lab work.  ELECTROLYTES:  Good - no changes needed  KIDNEY FUNCTION TESTS:  Good - no changes needed  LIVER FUNCTION TESTS:  Stable - no changes needed  LIPID PANEL:  Good - no changes needed Triglyceride: 87.0   Cholesterol: 191   LDL: 76   HDL: 97.90   Chol/HDL%:  2  THYROID STUDIES:  Thyroid studies normal TSH: 0.47     DIABETIC STUDIES:  Excellent - no changes needed Blood Glucose: 84    CBC:  Good - no changes needed One of your liver tests (AST) was ever so slightly elevated.  This should be repeated in 2-3 months to make sure that it normalizes.     Sincerely Yours,    Lemont Fillers FNP

## 2010-11-02 ENCOUNTER — Institutional Professional Consult (permissible substitution) (INDEPENDENT_AMBULATORY_CARE_PROVIDER_SITE_OTHER): Payer: Commercial Managed Care - PPO | Admitting: Internal Medicine

## 2010-11-02 ENCOUNTER — Encounter: Payer: Self-pay | Admitting: Family Medicine

## 2010-11-02 ENCOUNTER — Ambulatory Visit: Admit: 2010-11-02 | Payer: Self-pay | Admitting: Internal Medicine

## 2010-11-02 DIAGNOSIS — N951 Menopausal and female climacteric states: Secondary | ICD-10-CM

## 2010-11-02 DIAGNOSIS — R51 Headache: Secondary | ICD-10-CM

## 2010-11-09 NOTE — Miscellaneous (Signed)
  Clinical Lists Changes  Orders: Added new Referral order of Misc. Referral (Misc. Ref) - Signed 

## 2010-11-29 ENCOUNTER — Encounter (INDEPENDENT_AMBULATORY_CARE_PROVIDER_SITE_OTHER): Payer: Self-pay | Admitting: *Deleted

## 2010-11-29 NOTE — Consult Note (Signed)
Summary: Raechel Chute, M.D.  Raechel Chute, M.D.   Imported By: Maryln Gottron 11/25/2010 11:27:06  _____________________________________________________________________  External Attachment:    Type:   Image     Comment:   External Document

## 2010-12-01 ENCOUNTER — Ambulatory Visit (INDEPENDENT_AMBULATORY_CARE_PROVIDER_SITE_OTHER): Payer: Commercial Managed Care - PPO

## 2010-12-01 ENCOUNTER — Encounter: Payer: Self-pay | Admitting: Family Medicine

## 2010-12-01 DIAGNOSIS — Z23 Encounter for immunization: Secondary | ICD-10-CM

## 2010-12-01 DIAGNOSIS — Z2911 Encounter for prophylactic immunotherapy for respiratory syncytial virus (RSV): Secondary | ICD-10-CM

## 2010-12-07 ENCOUNTER — Encounter: Payer: Self-pay | Admitting: *Deleted

## 2010-12-08 NOTE — Assessment & Plan Note (Signed)
Summary: shingle shot///sph  Nurse Visit   Allergies: No Known Drug Allergies  Immunizations Administered:  Zostavax # 1:    Vaccine Type: Zostavax    Site: left SQ    Mfr: Merck    Dose: 0.70ml    Route: IM    Given by: Jeremy Johann CMA    Exp. Date: 10/15/2011    Lot #: [id number]    VIS given: 07/14/05 given December 01, 2010.  Orders Added: 1)  Zoster (Shingles) Vaccine Live [90736] 2)  Admin 1st Vaccine [phone]

## 2010-12-08 NOTE — Letter (Signed)
Summary: Pre Visit Letter Revised  [facility]  [address] [place], [place] [address]   Phone: [phone]  Fax: [phone]        11/29/2010 MRN: [id number] Kara Warren [address] [place], [place]  [address]             Procedure Date:  01/02/2011 @ 9:00   Direct colon-Dr. Christella Hartigan  Welcome to the Gastroenterology Division at [facility]    You are scheduled to see a nurse for your pre-procedure visit on 12/19/2010 at 10:30 on the 3rd floor at [facility], [address]  We ask that you try to arrive at our office 15 minutes prior to your appointment time to allow for check-in.  Please take a minute to review the attached form.  If you answer "Yes" to one or more of the questions on the first page, we ask that you call the person listed at your earliest opportunity.  If you answer "No" to all of the questions, please complete the rest of the form and bring it to your appointment.    Your nurse visit will consist of discussing your medical and surgical history, your immediate family medical history, and your medications.   If you are unable to list all of your medications on the form, please bring the medication bottles to your appointment and we will list them.  We will need to be aware of both prescribed and over the counter drugs.  We will need to know exact dosage information as well.    Please be prepared to read and sign documents such as consent forms, a financial agreement, and acknowledgement forms.  If necessary, and with your consent, a friend or relative is welcome to sit-in on the nurse visit with you.  Please bring your insurance card so that we may make a copy of it.  If your insurance requires a referral to see a specialist, please bring your referral form from your primary care physician.  No co-pay is required for this nurse visit.     If you cannot keep your appointment, please call [phone] to cancel or reschedule prior  to your appointment date.  This allows [place] the opportunity to schedule an appointment for another patient in need of care.    Thank you for choosing [facility] for your medical needs.  We appreciate the opportunity to care for you.  Please visit [place] at our website  to learn more about our practice.  Sincerely, The Gastroenterology Division

## 2010-12-16 ENCOUNTER — Encounter: Payer: Self-pay | Admitting: *Deleted

## 2010-12-19 ENCOUNTER — Encounter: Payer: Self-pay | Admitting: Gastroenterology

## 2010-12-21 ENCOUNTER — Telehealth: Payer: Self-pay | Admitting: Gastroenterology

## 2010-12-22 NOTE — Telephone Encounter (Signed)
Pt has direct colon in April there is a Colon from her previous GI on your desk for review

## 2010-12-22 NOTE — Telephone Encounter (Signed)
Not sure i know what this is about?

## 2010-12-23 ENCOUNTER — Telehealth: Payer: Self-pay

## 2010-12-23 NOTE — Telephone Encounter (Signed)
Left message on machine to call back pt needs to have office visit before colon, colon will be cx.

## 2010-12-26 NOTE — Telephone Encounter (Signed)
Pt aware colon cx and New pt appt made

## 2010-12-29 NOTE — Miscellaneous (Signed)
Summary: [facility]  Clinical Lists Changes  Medications: Added new medication of MOVIPREP 100 GM  SOLR (PEG-KCL-NACL-NASULF-NA ASC-C) As per prep instructions. - Signed Rx of MOVIPREP 100 GM  SOLR (PEG-KCL-NACL-NASULF-NA ASC-C) As per prep instructions.;  #1 x 0;  Signed;  Entered by: Ezra Sites RN;  Authorized by: Rachael Fee MD;  Method used: Electronically to [facility]*, [address], [address], [place], [place]  [address], Ph: [phone], Fax: [phone] Observations: Added new observation of NKA: T (12/19/2010 10:29)    Prescriptions: MOVIPREP 100 GM  SOLR (PEG-KCL-NACL-NASULF-NA ASC-C) As per prep instructions.  #1 x 0   Entered by:   Ezra Sites RN   Authorized by:   Rachael Fee MD   Signed by:   Ezra Sites RN on 12/19/2010   Method used:   Electronically to        [facility]* (retail)       [address]       [address]       [place], [place]  [address]       Ph: [phone]       Fax: [phone]   RxID:   [id number]

## 2010-12-29 NOTE — Letter (Signed)
Summary: [facility] Instructions  [facility]  [address] [place], [place] [address]   Phone: [phone]  Fax: [phone]       Kara Warren    1956/06/27    MRN: [id number]        Procedure Day /Date:  Monday 01/02/2011     Arrival Time: 8:00 am     Procedure Time: 9:00 am     Location of Procedure:                    _x _  [facility] (4th Floor)                        PREPARATION FOR COLONOSCOPY WITH MOVIPREP   Starting 5 days prior to your procedure Wednesday 3/28 do not eat nuts, seeds, popcorn, corn, beans, peas,  salads, or any raw vegetables.  Do not take any fiber supplements (e.g. Metamucil, Citrucel, and Benefiber).  THE DAY BEFORE YOUR PROCEDURE         DATE: Sunday 4/1  1.  Drink clear liquids the entire day-NO SOLID FOOD  2.  Do not drink anything colored red or purple.  Avoid juices with pulp.  No orange juice.  3.  Drink at least 64 oz. (8 glasses) of fluid/clear liquids during the day to prevent dehydration and help the prep work efficiently.  CLEAR LIQUIDS INCLUDE: Water Jello Ice Popsicles Tea (sugar ok, no milk/cream) Powdered fruit flavored drinks Coffee (sugar ok, no milk/cream) Gatorade Juice: apple, white grape, white cranberry  Lemonade Clear bullion, consomm, broth Carbonated beverages (any kind) Strained chicken noodle soup Hard Candy                             4.  In the morning, mix first dose of MoviPrep solution:    Empty 1 Pouch A and 1 Pouch B into the disposable container    Add lukewarm drinking water to the top line of the container. Mix to dissolve    Refrigerate (mixed solution should be used within 24 hrs)  5.  Begin drinking the prep at 5:00 p.m. The MoviPrep container is divided by 4 marks.   Every 15 minutes drink the solution down to the next mark (approximately 8 oz) until the full liter is complete.   6.  Follow completed prep with 16 oz of clear liquid of your choice (Nothing  red or purple).  Continue to drink clear liquids until bedtime.  7.  Before going to bed, mix second dose of MoviPrep solution:    Empty 1 Pouch A and 1 Pouch B into the disposable container    Add lukewarm drinking water to the top line of the container. Mix to dissolve    Refrigerate  THE DAY OF YOUR PROCEDURE      DATE: Monday 4/2  Beginning at 4:00 a.m. (5 hours before procedure):         1. Every 15 minutes, drink the solution down to the next mark (approx 8 oz) until the full liter is complete.  2. Follow completed prep with 16 oz. of clear liquid of your choice.    3. You may drink clear liquids until 7:00 am (2 HOURS BEFORE PROCEDURE).   MEDICATION INSTRUCTIONS  Unless otherwise instructed, you should take regular prescription medications with a small sip of water   as early as possible the morning of your  procedure.           OTHER INSTRUCTIONS  You will need a responsible adult at least 56 years of age to accompany you and drive you home.   This person must remain in the waiting room during your procedure.  Wear loose fitting clothing that is easily removed.  Leave jewelry and other valuables at home.  However, you may wish to bring a book to read or  an iPod/MP3 player to listen to music as you wait for your procedure to start.  Remove all body piercing jewelry and leave at home.  Total time from sign-in until discharge is approximately 2-3 hours.  You should go home directly after your procedure and rest.  You can resume normal activities the  day after your procedure.  The day of your procedure you should not:   Drive   Make legal decisions   Operate machinery   Drink alcohol   Return to work  You will receive specific instructions about eating, activities and medications before you leave.    The above instructions have been reviewed and explained to me by   Ezra Sites RN  December 19, 2010 11:16 AM     I fully understand and can  verbalize these instructions _____________________________ Date _________

## 2011-01-02 ENCOUNTER — Other Ambulatory Visit: Payer: Commercial Managed Care - PPO | Admitting: Gastroenterology

## 2011-01-05 ENCOUNTER — Encounter: Payer: Self-pay | Admitting: Gastroenterology

## 2011-02-07 ENCOUNTER — Ambulatory Visit (INDEPENDENT_AMBULATORY_CARE_PROVIDER_SITE_OTHER): Payer: Commercial Managed Care - PPO | Admitting: Gastroenterology

## 2011-02-07 ENCOUNTER — Encounter: Payer: Self-pay | Admitting: Gastroenterology

## 2011-02-07 VITALS — BP 108/66 | HR 68 | Ht 65.0 in | Wt 144.8 lb

## 2011-02-07 DIAGNOSIS — R933 Abnormal findings on diagnostic imaging of other parts of digestive tract: Secondary | ICD-10-CM

## 2011-02-07 NOTE — Patient Instructions (Addendum)
We will get in touch with Dr. Willaim Bane office in [place], to get your colonoscopy report from 10-12 years ago (also to have pathology report from any biopsies done during that colonoscopy). We already have the 2007 colonoscopy report. We will decide on timing of next colonoscopy based on review of those meds.  If no information is available about the older colonoscopy, we will repeat colonoscopy now. A copy of this information will be made available to Dr. Beverely Low.

## 2011-02-07 NOTE — Progress Notes (Signed)
HPI: This is a  very pleasant 55 yo woman   She had colonoscopy 08/2006 in [place], indications on report state for "family history of colon cancer."  She had colonoscopy done 5-6 years prior, believes that polyps were removed.  We do not have those results.  Her father has ulcerative colitis, but never colon cancer Her mother never had colon cancer Her mothers father had colon cancer in his 50s or so.  She has had no overt GI bleeding, no significant changes in her bowels. Her weight has been relatively stable.    Review of systems: Pertinent positive and negative review of systems were noted in the above HPI section.  All other review of systems was otherwise negative.   Past Medical History, Past Surgical History, Family History, Social History, Current Medications, Allergies were all reviewed with the patient via [facility] electronic medical record system.   Physical Exam: BP 108/66  Pulse 68  Ht 5\' 5"  (1.651 m)  Wt 144 lb 12.8 oz (65.681 kg)  BMI 24.10 kg/m2 Constitutional: generally well-appearing Psychiatric: alert and oriented x3 Eyes: extraocular movements intact Mouth: oral pharynx moist, no lesions Neck: supple no lymphadenopathy Cardiovascular: heart regular rate and rhythm Lungs: clear to auscultation bilaterally Abdomen: soft, nontender, nondistended, no obvious ascites, no peritoneal signs, normal bowel sounds Extremities: no lower extremity edema bilaterally Skin: no lesions on visible extremities    Assessment and plan: 55 y.o. female with previous colonoscopy around 2002, she believes that polyps were removed  We will try to track down the results from her colonoscopy 10-12 years ago, biopsy reports as well. If it is found that she had adenomatous polyps removed then we will repeat her colonoscopy at proper interval. If we cannot find the data from that colonoscopy but unlikely assuming she had a previous adenoma and at least repeat her  colonoscopy around now. Going forward however I would probably put her back into a routine risk category if no adenomatous polyps are proven.

## 2011-02-14 NOTE — Group Therapy Note (Signed)
REASON FOR CONSULTATION:  Evaluation for acupuncture and/or physical  modalities for neck pain right greater than left side going to the  shoulder.   The patient is a 55 year old female who had gradual onset of neck pain  starting a couple years ago.  She was evaluated in [place], [place] by both  orthopedist and neurosurgeon, no MRIs, plain films only.  Seen by Dr.  Stevphen Rochester Feb 12, 2007, ordered an MRI which was completed today but  results not available at this time.  She states she does not like taking  medications and is looking for other ways to manage her neck pain.  She  has had cervical trigger point injections per Dr. Stevphen Rochester Feb 12, 2007  which were helpful.  He is planning medial branch block after reviewing  the MRI.  She has tried some Herbalist which she really did not want to take  much, she took maybe 1/2 tablet once.  She really has not used the  Tramadol either.   She has had physical therapy in the past and has focused on doing  cervicothoracic stabilization exercises.  She is a part-time Hospital doctor.   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   LEISURE TIME ACTIVITIES:  Plays racquetball which caused some  exacerbation of her neck pain, considering tennis; occasional golf which  usually does not bother her neck.   CURRENT MEDICATIONS:  As noted above, has been prescribed a few  medications but really has not been taking anything on a regular basis.   Her pain is 2-3 out of 10, sleep is good.  She has no limitations in  terms of walking tolerance.  She climbs steps, she drives.   SOCIAL HISTORY:  She lives alone in [place].  No smoking or alcohol  use.   EXAMINATION:  Blood pressure 100/55, pulse 61, respirations 16, O2 sat  is 97% room air.  GENERAL:  In no acute distress, mood and affect are appropriate.  NECK:  Range of motion is good in terms of rotation and had 75% in the  lateral bending and  50% extension, and 75% forward flexion.  She has no tenderness to palpitation along the cervical paraspinals for  the parascapular musculature.  She has 5/5 strength in bilateral infraspinatus, deltoid, biceps/triceps  grip as well as hip flexor and the extensor, ankle dorsiflexor sensation  is normal, bilateral upper extremities.  Deep tendon reflexes are 3+ but  symmetric bilateral biceps/triceps, brachioradialis, knee, and ankle.  Her ambulation is normal.  She is able to heel walk and toe walk.   IMPRESSION:  Cervical spondylosis, probable syndrome with little in  terms of myofascial element at this time.  Her history sounds to be  marked by exacerbations and improvements over time.  She is looking more  towards physical modalities rather than oral medication to manage her  pain complaints.   PLAN:  1. We discussed types of activities that more likely exacerbated her      pain such as looking over head, and to that effect would not      recommend her pursuing tennis and may need to also be careful in      terms of racquetball.  Her other activities such as Pilates and      running should not have any significant effect.  2. Think she cam benefit from a trial of acupuncture.  We went over      usual  trial of 4-6 treatments spaced no longer than one week apart.      She would like to proceed with this.   I will see her back for the acupuncture treatments and will do a trial  treatment today.   ADDENDUM   Acupuncture treatment included points placed bilateral KI3, SP6, HT3;  electrostimulation between KI3 and SP6 bilaterally.  Patient tolerated  procedure well.  Two hertz, Stim time is 20 minutes.  See her back next  week for a more extensive treatment.      Erick Colace, M.D.  Electronically Signed     AEK/MedQ  D:  02/26/2007 15:47:26  T:  02/26/2007 17:07:14  Job #:  [id number]   cc:   Celene Kras, MD  Fax: [phone]

## 2011-02-14 NOTE — Op Note (Signed)
NAMEJAVEAH, Kara Warren NO.:  [id number]   MEDICAL RECORD NO.:  [id number]          PATIENT TYPE:  AMB   LOCATION:  SDC                           FACILITY:  [facility]   PHYSICIAN:  Juluis Mire, M.D.   DATE OF BIRTH:  Oct 10, 1955   DATE OF PROCEDURE:  05/23/2007  DATE OF DISCHARGE:                               OPERATIVE REPORT   PREOPERATIVE DIAGNOSIS:  Postmenopausal bleeding with endometrial polyp.   POSTOPERATIVE DIAGNOSIS:  Postmenopausal bleeding with endometrial  polyp.   PROCEDURE:  Was paracervical block.  Cervical dilation.  Hysteroscopy,  resection of polyps and multiple endometrial biopsies.  Endometrial  curettings.   SURGEON:  Juluis Mire, M.D.   ANESTHESIA:  Was general.   ESTIMATED BLOOD LOSS:  Minimal.   PACKS AND DRAINS:  None.   BLOOD REPLACED:  None.   COMPLICATIONS:  None.   INDICATIONS:  Noted in history and physical.   DESCRIPTION OF PROCEDURE:  The patient was taken to OR, placed supine  position.  After satisfactory level of general anesthesia obtained, the  patient was placed in dorsal lithotomy position using the Allen  stirrups.  The perineum and vagina prepped out with Betadine and the  patient was draped in sterile field. A speculum was placed in the  vaginal vault.  Cervix was grasped with a single tooth tenaculum.  It  was noted that there was a fullness to the right side of cervix.  It did  drain a fair amount of purulent material. We opened it up and this  looked like a large Bartholin cyst that we were draining.  We drained  that completely, we then instituted paracervical block using 1%  Nesacaine. Uterus sounded to 8 cm.  Cervix serially dilated to size 35  Pratt dilator.  Operative hysteroscope was introduced and intrauterine  cavity was distended using sorbitol.  Visualization revealed a polyp in  the anterior left uterine wall.  This was resected in total and sent for  pathological review.  We then did multiple  endometrial biopsies. At this  point the endometrial cavity was clear and otherwise unremarkable.  Hysteroscope was removed.  We did endometrial curettings.  Deficit was  minimal.  There was no signs of perforation or complications.  No  active bleeding. At this point time the single-tooth tenaculum removed.  The patient taken out of dorsal lithotomy position.  Once alert,  extubated, transferred to good condition.  Sponge, instrument and needle  count was correct by circulating nurse.      Juluis Mire, M.D.  Electronically Signed     JSM/MEDQ  D:  05/23/2007  T:  05/23/2007  Job:  [id number]

## 2011-02-14 NOTE — Procedures (Signed)
NAME:  Kara Warren, Kara Warren NO.:  [id number]   MEDICAL RECORD NO.:  [id number]          PATIENT TYPE:  REC   LOCATION:  TPC                          FACILITY:  [facility]   PHYSICIAN:  Celene Kras, MD        DATE OF BIRTH:  [birth year]   DATE OF PROCEDURE:  02/12/2007  DATE OF DISCHARGE:                               OPERATIVE REPORT   Kara Warren is a pleasant 55 year old who comes to [place] today for evaluation and  management of right neck pain.  Her pain extends in the paracervical  region, the suprascapular and levator scapular region.  No obvious  trauma, nothing we would consider as a precipitating event, this has  been a progressive and prolonged issue for her.  She was evaluated in  [place], [place], by orthopedists and neurosurgeons, but interestingly  has not had an MRI.  She brings some plain films which I review, modest  degenerative changes, but does seem to have a facetal arthritic  component.  She describes a modest myelopathy at 3, 6, 7, sometimes C5,  but it is not present today.  She does not have trouble with grip  strength or holding things this.  This is more aggravated with movement,  and particularly at sleep where she has use a cervical collar.  She has  done this for some time.  Very active, she like Pilates and she was a  runner, very fit, with no particular risk factors identified such as  cigarettes.  She has had difficulty with endurance and range of motion  activities.  We are awaiting an MRI which we had ordered.  She is a  transplant recently, and has not established.  She does not like taking  medications, and has tried typical NSAIDs and the like.  She has failed  these.   PAST MEDICAL HISTORY:  Remarkable for an otherwise healthy individual,  she states that she has had some recent female surgery, it sounds like  an interventional radiologist had treated her uterus for uterine  fibroids successfully.   FAMILY HISTORY:  Otherwise  noncontributory.   She states she is worse with most activities, particularly  flexion/extension, improved with sitting and rest.  A 14 point review of  systems, health and history form.  Medications attached to chart as are  allergies.  Reviewed.   SOCIAL HISTORY:  Nonsmoker, nondrinker.   Review of systems, family and social history otherwise noncontributory  to the pain problem.   PHYSICAL EXAM:  GENERAL APPEARANCE:  A pleasant engaging individual,  chaperoned, at no time physician left unattended.  HEENT:  Unremarkable.  CHEST:  Clear to auscultation and percussion.  CARDIAC:  Regular rate and rhythm without rub, murmur, gallop.  ABDOMEN:  Soft, nontender.  NEUROLOGIC:  She has diffuse suprascapular paracervical myofascial  comfort with positive cervical facetal compression test, right greater  than left.  This extends to the levator scapular region, lateral  rotational capacity increases pain.  She has some no obvious grip  strength deficit, intact neurologically otherwise.   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   PLAN:  1. Await MRI.  2. Trigger point injection.  She has some myofascial extension and I      think will response.  She has tried Medrol Dosepak in the past and      has had some response and this is probably going to be a more      focused intervention.  3. Soma at bedtime.  I think she is probably pretty reliance on her      cervical collar which will potentially decrease the cervical      support structures.  Will trial Soma for help with restorative      sleep capacity and the myofascial pain at bedtime.  Will also add      tramadol for breakthrough pain, not commonly relieved by Tylenol      and the like.  We reviewed this medication.  4. Will go ahead and also order a muscle stimulator.  This will be for      reeducation.  5. Lidoderm samples are given.  6. I would  like her to see Dr. Wynn Banker for possible acupuncture.      This will minimize potential interventions, and probably enhance      outcome.  7. I am anticipating that she probably has a mechanical component,      will probably intervene at the facet.  This will be at the medial      branch with RF as an option down the road.  Will see the MRI shows      and of course enlist our surgical colleagues if necessary.   Reviewed with her extensive consultation, discharge instructions given.  She is consented for trigger point injection.   Skin is prepped using 40 mg Aristocort and 5 mL lidocaine in divided  dose.  Paracervical, suprascapular and levator scapular, cautions as to  depth, to avoid dimpling.   Tolerated, no complications from our procedure.  Will see her back in a  couple of weeks.           ______________________________  Celene Kras, MD     HH/MEDQ  D:  02/12/2007 10:49:29  T:  02/12/2007 11:50:58  Job:  [id number]

## 2011-02-14 NOTE — H&P (Signed)
NAME:  Kara Warren, Kara Warren NO.:  [id number]   MEDICAL RECORD NO.:  [id number]          PATIENT TYPE:  AMB   LOCATION:  SDC                           [facility]   PHYSICIAN:  Juluis Mire, M.D.   DATE OF BIRTH:  06/17/1956   DATE OF ADMISSION:  05/23/2007  DATE OF DISCHARGE:                              HISTORY & PHYSICAL   The patient is a 55 year old nulligravida postmenopausal patient who  presents for hysteroscopy and D&C.  The patient presented to the office  in February for routine evaluation.  No abnormalities were specifically  noted at that time.  Subsequently she has had episodes of postmenopausal  bleeding, underwent saline infusion ultrasound with finding of  endometrial polyp like out-growth.  In view of this she now presents for  hysteroscopic evaluation with D&C.   ALLERGIES:  She is allergic ERYTHROMYCIN.   MEDICATION:  Include Lexapro and Zomig.   PAST MEDICAL HISTORY:  She has had usual childhood diseases without any  significant sequelae.   PAST SURGICAL EVALUATION:  She had examine under anesthesia in October  2007 finding a fibroid tumor.   FAMILY HISTORY:  Paternal grandmother with breast cancer.  Father had a  history of a stroke.  Father also has a history of ulcerated colitis.   SOCIAL HISTORY:  Reveals no tobacco or alcohol use.   REVIEW OF SYSTEMS:  Is noncontributory.   PHYSICAL EXAMINATION:  VITAL SIGNS:  The patient afebrile, stable vital  signs.  HEENT: The patient is normocephalic.  Pupils equal and react to light  and accommodation.  Extraocular were intact.  Sclerae are clear illness.  Pharynx clear.  NECK:  Without thyromegaly.  BREASTS:  Not examined.  LUNGS:  Clear.  HEART:  Regular rate without murmurs or gallops.  ABDOMEN:  Benign.  No mass, organomegaly or tenderness.  PELVIC:  Normal external genitalia.  Vaginal mucosa is clear.  Cervix  unremarkable.  Uterus normal size, shape and contour.  Adnexa free of  masses or tenderness.  EXTREMITIES:  Trace edema.  NEUROLOGY:  Grossly within normal limits.   IMPRESSION:  Postmenopausal bleeding with endometrial polyp.   PLAN:  The patient will undergo hysteroscopic evaluation with D&C.  Risks were discussed with the risk of infection.  The risk of hemorrhage  that could require transfusion with the risk of AIDS or hepatitis.  The  risk of injury to adjacent organs including bladder, bowel, ureters that  could require further exploratory surgery.  Risk of deep venous  thrombosis and pulmonary emboli.  The patient does understand  indications and risks.      Juluis Mire, M.D.  Electronically Signed     JSM/MEDQ  D:  05/23/2007  T:  05/23/2007  Job:  [id number]

## 2011-02-14 NOTE — Assessment & Plan Note (Signed)
Mr. Stambaugh returns today.  She is a 55 year old female who I saw in  initial consultation on Feb 27, 2007.  She had one acupuncture visit,  but elected not to complete.  She had a cervical trigger point injection  performed on Feb 12, 2007, which was helpful.  She had a prior cervical  MRI demonstrating cervical spondylosis causing foraminal stenosis at C5-  6.  She does note intermittent pain down through her right thumb, this  is really when her neck gets flared up.  Extension activities such as  tennis flare her pain up.  She occasionally has a catching feeling in  her neck.  Her pain does wake her up at night.  She does have some  radiation to the temporal aspect, retrobulbar.   Her average pain is about 5-6, currently 4, intermittent.  Her pain does  interfere with activity at a moderate level.  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  She does running, cycling, and Pilates.  The pain interferes with  general activity at a moderate level, enjoyment of life at a moderate  level.  Sleep is fair.  Improves with rest and pacing.  There is some  tingling in the thumb as noted above.  She has some night sweats related  to menopause.   SOCIAL HISTORY:  Divorced.  Alcohol use; occasional glass of wine.   PHYSICAL EXAMINATION:  VITAL SIGNS:  Blood pressure 115/58, pulse 67,  respiratory rate 18, and O2 sat 100% on room air.  GENERAL:  Well-developed, well-nourished female in no acute distress.  Orientation x3.  Affect is bright and alert.  Gait is normal.  MUSCULOSKELETAL:  Her neck has some 50% range with extension, 75%  flexion, 50% range with lateral bending, 50% rotation to the right, and  75-200 rotation to the left.  Motor strength 5/5 bilateral deltoid,  biceps, triceps, grip, as well as hip flexion, knee extension, and ankle  dorsiflexion.  Sensation is normal, bilateral upper and lower  extremities.  Range of motion is  normal, bilateral upper and lower  extremities.  Gait is normal.  She is able to toe walk and heel walk.  EXTREMITIES:  Without edema.  Pulses are normal.   IMPRESSION:  Cervical spondylosis.  She has a combination of cervical  facet syndrome with intermittent radicular symptomatology.  We discussed  treatment options.  She would like to stick with nonmedication  treatments.  We discussed cervical retraction as a potential treatment  modality.  I referred her to posture practice program at [facility].   I will see her back in about a month.  She will continue Tylenol or  Advil on an intermittent basis on her bad days.  She is advised to  continue her exercise, but avoid extension to the neck and she agrees  with this plan.  Over 1/2 of the 30 min visit was spent counseling and coordination of  care.      Erick Colace, M.D.  Electronically Signed     AEK/MedQ  D:  05/18/2008 17:31:16  T:  05/19/2008 07:52:29  Job #:  [id number]   cc:   Elouise Munroe  [facility]

## 2011-02-21 ENCOUNTER — Telehealth: Payer: Self-pay | Admitting: Gastroenterology

## 2011-02-21 NOTE — Telephone Encounter (Signed)
Dr Christella Hartigan the pt is having the records faxed to [place] she found the procedures dated 1990 and 2001 with the path reports.  She wants you to review those before we schedule.  I will put them on your desk as soon as they come over.

## 2011-02-21 NOTE — Telephone Encounter (Signed)
Left message on machine to call back  

## 2011-02-21 NOTE — Telephone Encounter (Signed)
Colonoscopy 08/2006 by Dr. Charlsie Quest in [place].  "family history of colorectal cancer(screening only)".  No polyps found. +hems, +divericulosis.    Please call patient, we do not have records from a colonoscopy that she thinks she had 10-12 years ago, is she certain she had one prior to her 2007 colonoscopy.  If so, let's schedule her for repeat colonoscopy now (we tried to get records from previous one where she believes she had polyp(s) removed).

## 2011-02-22 NOTE — Telephone Encounter (Signed)
Ok, great.

## 2011-02-24 ENCOUNTER — Telehealth: Payer: Self-pay

## 2011-02-24 ENCOUNTER — Telehealth: Payer: Self-pay | Admitting: Gastroenterology

## 2011-02-24 NOTE — Telephone Encounter (Signed)
Colonoscopy 07/2000 Dr. Charlsie Quest in [place]; done for FH of CRC, cramping, constipation; "melanosis and hemorrhoids... No polyps" Colonoscopy 1990 Dr/ Brand in [place]; for FOBT +stools; "small cecal polyp, hemorrhoids."  Pathology not sent   Patty can you please let her know that She does not need repeat colonoscopy (put in recall) until 08/2016 for routine screening reasons. I reviewed outside reports from 2001 and 1990 colonoscopies.   thanks

## 2011-02-24 NOTE — Telephone Encounter (Signed)
Rob Bunting, MD 02/24/2011 12:59 PM Signed  Colonoscopy 07/2000 Dr. Charlsie Quest in [place]; done for FH of CRC, cramping, constipation; "melanosis and hemorrhoids... No polyps"  Colonoscopy 1990 Dr/ Brand in [place]; for FOBT +stools; "small cecal polyp, hemorrhoids." Pathology not sent  Areyanna Figeroa can you please let her know that She does not need repeat colonoscopy (put in recall) until 08/2016 for routine screening reasons. I reviewed outside reports from 2001 and 1990 colonoscopies.  thanks  Pt aware recall in EPIC

## 2011-04-19 ENCOUNTER — Encounter: Payer: Self-pay | Admitting: Internal Medicine

## 2011-04-19 ENCOUNTER — Encounter: Payer: Self-pay | Admitting: Emergency Medicine

## 2011-04-19 ENCOUNTER — Other Ambulatory Visit: Payer: Self-pay | Admitting: Internal Medicine

## 2011-04-19 ENCOUNTER — Ambulatory Visit (INDEPENDENT_AMBULATORY_CARE_PROVIDER_SITE_OTHER): Payer: Commercial Managed Care - PPO | Admitting: Internal Medicine

## 2011-04-19 VITALS — BP 108/70 | HR 58 | Temp 97.1°F | Resp 12 | Ht 64.5 in | Wt 142.0 lb

## 2011-04-19 DIAGNOSIS — J329 Chronic sinusitis, unspecified: Secondary | ICD-10-CM

## 2011-04-19 DIAGNOSIS — J310 Chronic rhinitis: Secondary | ICD-10-CM

## 2011-04-19 LAB — CBC WITH DIFFERENTIAL/PLATELET
Hemoglobin: 13 g/dL (ref 12.0–15.0)
Lymphs Abs: 2.9 10*3/uL (ref 0.7–4.0)
MCHC: 32.7 g/dL (ref 30.0–36.0)
Monocytes Absolute: 0.4 10*3/uL (ref 0.1–1.0)
Platelets: 292 10*3/uL (ref 150–400)

## 2011-04-19 MED ORDER — AMOXICILLIN-POT CLAVULANATE 500-125 MG PO TABS: 1.0000 | ORAL_TABLET | Freq: Two times a day (BID) | ORAL | Status: AC

## 2011-04-19 MED ORDER — FLUTICASONE PROPIONATE 50 MCG/ACT NA SUSP: 2.0000 | Freq: Every day | NASAL | Status: DC

## 2011-04-20 ENCOUNTER — Encounter: Payer: Self-pay | Admitting: Internal Medicine

## 2011-04-20 DIAGNOSIS — K635 Polyp of colon: Secondary | ICD-10-CM | POA: Insufficient documentation

## 2011-04-20 DIAGNOSIS — Z780 Asymptomatic menopausal state: Secondary | ICD-10-CM | POA: Insufficient documentation

## 2011-04-20 DIAGNOSIS — N340 Urethral abscess: Secondary | ICD-10-CM | POA: Insufficient documentation

## 2011-04-20 DIAGNOSIS — G43909 Migraine, unspecified, not intractable, without status migrainosus: Secondary | ICD-10-CM | POA: Insufficient documentation

## 2011-04-20 DIAGNOSIS — N840 Polyp of corpus uteri: Secondary | ICD-10-CM | POA: Insufficient documentation

## 2011-04-20 DIAGNOSIS — J329 Chronic sinusitis, unspecified: Secondary | ICD-10-CM | POA: Insufficient documentation

## 2011-04-20 NOTE — Progress Notes (Signed)
Subjective:    Patient ID: Kara Warren, female    DOB: [birth year], 55 y.o.   MRN: [id number]  HPI  Kara Warren presents today with concern for sinus infection.  She has a history of repeated infections and has seen Dr. Allegra Grana in the past.  To her memory she was told that she has a deviated septum but no polyps.  She uses Netti pott and sinus rinses on an as needed basis.  She has been congested for a few weeks, no cough or nasal discharge but "has not felt right in the past few weeks.  There is some sinus tenderness in maxillary area and some posterior throat drainage.  No fever, no headache  No Known Allergies Past Medical History  Diagnosis Date  . Colon polyp   . Menopause   . Migraine   . Endometrial polyp 8/08    benign  . Skene's gland abscess 2009  . Sinusitis, chronic    Past Surgical History  Procedure Date  . [place] 2007  . D&c 2009  . Dilation and curettage of uterus 8/08   History   Social History  . Marital Status: Divorced    Spouse Name: N/A    Number of Children: N/A  . Years of Education: N/A   Occupational History  . RN [employer]   Social History Main Topics  . Smoking status: Former Smoker    Quit date: 10/02/1978  . Smokeless tobacco: Never Used  . Alcohol Use: 3.0 oz/week    5 Glasses of wine per week  . Drug Use: No  . Sexually Active: Not on file   Other Topics Concern  . Not on file   Social History Narrative  . No narrative on file   Family History  Problem Relation Age of Onset  . Breast cancer Paternal Grandmother   . Colon cancer Maternal Grandfather   . Colon polyps Father   . Colon polyps Mother   . Ulcerative colitis Father   . Osteoporosis Mother   . Thyroid disease Mother     hypo  . Glaucoma Mother   . Heart disease Father   . Glaucoma Father    Patient Active Problem List  Diagnoses  . HYPERLIPIDEMIA  . NEUTROPENIA UNSPECIFIED  . DEPRESSION  . Acute sinusitis, unspecified  . RHINITIS  . LUNG NODULE  . HIP  PAIN, RIGHT  . TRANSAMINASES, SERUM, ELEVATED  . Nonspecific (abnormal) findings on radiological and other examination of body structure  . NONSPCIFC ABN FINDING RAD & OTH EXAM LUNG FIELD  . Sinusitis, chronic  . Colon polyp  . Endometrial polyp  . Menopause  . Skene's gland abscess  . Migraine       Review of Systems  See HPI     Objective:   Physical Exam  Constitutional: She appears well-developed and well-nourished.  HENT:  Head: Normocephalic and atraumatic.  Right Ear: A middle ear effusion is present.  Left Ear: A middle ear effusion is present.  Nose: Mucosal edema present. Right sinus exhibits maxillary sinus tenderness. Left sinus exhibits maxillary sinus tenderness.  Mouth/Throat: Mucous membranes are normal. No oropharyngeal exudate or posterior oropharyngeal erythema.       Serous eff. bilaterally  Cardiovascular: Normal heart sounds.   Pulmonary/Chest: Breath sounds normal. She has no wheezes. She has no rales. She exhibits no tenderness.  Skin: Skin is warm and dry. No rash noted.          Assessment & Plan:  1)  Sinusitis:  Will give 10 day course of Augmentin 500 mg.  If no improvement consider imaging or re-evaluation with Dr. Darlyn Read.  Will also check cbc today  2) Rhinitis:  Empiric trial of flonase 2 sprays daily   Recheck prn if not improved, schedule CPE

## 2011-06-13 ENCOUNTER — Encounter: Payer: Self-pay | Admitting: Internal Medicine

## 2011-06-23 ENCOUNTER — Inpatient Hospital Stay (INDEPENDENT_AMBULATORY_CARE_PROVIDER_SITE_OTHER)
Admission: RE | Admit: 2011-06-23 | Discharge: 2011-06-23 | Disposition: A | Discharge status: Home / Self Care | Payer: Commercial Managed Care - PPO | Source: Ambulatory Visit | Attending: Emergency Medicine | Admitting: Emergency Medicine

## 2011-06-23 ENCOUNTER — Encounter: Payer: Self-pay | Admitting: Emergency Medicine

## 2011-06-23 DIAGNOSIS — J069 Acute upper respiratory infection, unspecified: Secondary | ICD-10-CM

## 2011-06-23 DIAGNOSIS — R059 Cough, unspecified: Secondary | ICD-10-CM

## 2011-06-23 DIAGNOSIS — R05 Cough: Secondary | ICD-10-CM

## 2011-06-23 LAB — CONVERTED CEMR LAB: Rapid Strep: NEGATIVE

## 2011-06-29 LAB — GC/CHLAMYDIA PROBE AMP, GENITAL
Chlamydia, DNA Probe: NEGATIVE
GC Probe Amp, Genital: NEGATIVE

## 2011-06-29 LAB — POCT URINALYSIS DIP (DEVICE)
Specific Gravity, Urine: 1.015
Urobilinogen, UA: 0.2
pH: 5.5

## 2011-06-29 LAB — URINALYSIS, MICROSCOPIC ONLY
Bilirubin Urine: NEGATIVE
Glucose, UA: NEGATIVE
Ketones, ur: NEGATIVE
pH: 5.5

## 2011-06-29 LAB — URINE CULTURE

## 2011-06-29 LAB — WET PREP, GENITAL
Clue Cells Wet Prep HPF POC: NONE SEEN
Trich, Wet Prep: NONE SEEN

## 2011-07-14 LAB — CBC
Hemoglobin: 13.1
MCHC: 34.8
MCV: 97.3
RBC: 3.85 — ABNORMAL LOW

## 2011-08-09 ENCOUNTER — Ambulatory Visit (INDEPENDENT_AMBULATORY_CARE_PROVIDER_SITE_OTHER): Payer: Commercial Managed Care - PPO | Admitting: Family Medicine

## 2011-08-09 ENCOUNTER — Encounter: Payer: Self-pay | Admitting: Family Medicine

## 2011-08-09 VITALS — BP 114/75 | HR 59 | Temp 97.7°F | Ht 65.0 in | Wt 130.0 lb

## 2011-08-09 DIAGNOSIS — M79671 Pain in right foot: Secondary | ICD-10-CM | POA: Insufficient documentation

## 2011-08-09 DIAGNOSIS — M79609 Pain in unspecified limb: Secondary | ICD-10-CM

## 2011-08-09 HISTORY — DX: Pain in right foot: M79.671

## 2011-08-09 NOTE — Assessment & Plan Note (Signed)
Consistent with plantar fasciitis and posterior tibialis tendinopathy.  Start home exercise program, icing, continue orthotics, use scaphoid pads in dress shoes.  Tylenol/aleve as needed for pain.  See instructions for further.  Not consistent with stress fracture based on location, exam.

## 2011-08-09 NOTE — Progress Notes (Signed)
Subjective:    Patient ID: Kara Warren, female    DOB: November 14, 1955, 55 y.o.   MRN: [id number]  PCP: Dr. Constance Goltz  HPI 55 yo F here for right foot pain.  Patient denies acute injury. Typically runs about 3 miles at a time 5-6 times a week. Also walks 30 minutes each evening. States about 6 weeks ago started to have medial plantar right foot pain. Maybe a little swelling. Has history of plantar fasciitis. No bruising. Has orthotics and uses these with running. Has not changed workouts or increased speed/distance recently (actually decreased her workouts). No prior history of stress fractures. No dorsal foot pain.  Past Medical History  Diagnosis Date  . Colon polyp   . Menopause   . Migraine   . Endometrial polyp 8/08    benign  . Skene's gland abscess 2009  . Sinusitis, chronic     Current Outpatient Prescriptions on File Prior to Visit  Medication Sig Dispense Refill  . calcium-vitamin D (CALCIUM 500+D) 500-200 MG-UNIT per tablet Take 1 tablet by mouth daily.        Marland Kitchen escitalopram (LEXAPRO) 10 MG tablet Take 5 mg by mouth daily.       Marland Kitchen loratadine (CLARITIN) 10 MG tablet Take 10 mg by mouth daily.        Marland Kitchen Lysine 500 MG TABS Take 1 tablet by mouth daily.        . Multiple Vitamins-Minerals (MULTI FOR HER 50+) TABS Take 1 tablet by mouth daily.        . Probiotic Product (PROBIOTIC PO) Take 1 capsule by mouth daily.        . Psyllium-Calcium (METAMUCIL PLUS CALCIUM) CAPS Take 1 capsule by mouth daily.          Past Surgical History  Procedure Date  . [place] 2007  . D&c 2009  . Dilation and curettage of uterus 8/08    No Known Allergies  History   Social History  . Marital Status: Divorced    Spouse Name: N/A    Number of Children: N/A  . Years of Education: N/A   Occupational History  . RN [employer]   Social History Main Topics  . Smoking status: Former Smoker    Quit date: 10/02/1978  . Smokeless tobacco: Never Used  . Alcohol Use: 3.0 oz/week      5 Glasses of wine per week  . Drug Use: No  . Sexually Active: Not on file   Other Topics Concern  . Not on file   Social History Narrative  . No narrative on file    Family History  Problem Relation Age of Onset  . Breast cancer Paternal Grandmother   . Colon cancer Maternal Grandfather   . Colon polyps Father   . Ulcerative colitis Father   . Heart disease Father   . Glaucoma Father   . Hypertension Father   . Colon polyps Mother   . Osteoporosis Mother   . Thyroid disease Mother     hypo  . Glaucoma Mother   . Sudden death Neg Hx   . Hyperlipidemia Neg Hx   . Heart attack Neg Hx   . Diabetes Neg Hx     BP 114/75  Pulse 59  Temp(Src) 97.7 F (36.5 C) (Oral)  Ht 5\' 5"  (1.651 m)  Wt 130 lb (58.968 kg)  BMI 21.63 kg/m2  Review of Systems See HPI above.    Objective:   Physical Exam Gen: NAD  R foot: Mild overpronation - otherwise well preserved arches.  Corn right plantar foot.  No other deformity, swelling, bruising. TTP proximal plantar fascia and distal aspect posterior tibialis tendon.  No TTP fibular head, malleoli, navicular, base 5th, achilles, or other areas about foot/ankle. FROM ankle with 5/5 strength - pain on resisted internal rotation.  No other pain on resisted motions. Negative ant drawer and talar tilt. Negative thompsons.     Assessment & Plan:  1. Right foot pain - Consistent with plantar fasciitis and posterior tibialis tendinopathy.  Start home exercise program, icing, continue orthotics, use scaphoid pads in dress shoes.  Tylenol/aleve as needed for pain.  See instructions for further.  Not consistent with stress fracture based on location, exam.

## 2011-08-09 NOTE — Patient Instructions (Signed)
You have plantar fasciitis and posterior tibialis tendinopathy. Take tylenol or aleve as needed for pain  Plantar fascia stretch for 20-30 seconds (do 3 of these) in morning Lowering/raise on a step exercises 3 x 10 once or twice a day - this is very important for long term recovery. Theraband internal rotation strengthening exercise 3 sets of 10 once a day. Can add heel walks, toe walks forward and backward as well Ice bucket 10-15 minutes at end of day Avoid flat shoes/barefoot walking as much as possible. Continue using your orthotics.  Use the half moon scaphoid pads in other shoes as much as you can. Steroid injection is a consideration for short term pain relief if you are struggling. Physical therapy is also an option. Follow up with me in 1 month for a recheck.

## 2011-09-04 NOTE — Progress Notes (Signed)
Summary: Severe sore throat/left ear pain/chest congestion room 5   Vital Signs:  Patient Profile:   55 Years Old Female CC:      Sore throat, left ear pain, cough x 1 day Height:     64 inches Weight:      143 pounds O2 Sat:      100 % O2 treatment:    Room Air Temp:     98.6 degrees F oral Pulse rate:   62 / minute Pulse rhythm:   regular Resp:     16 per minute BP sitting:   127 / 82  (left arm) Cuff size:   regular  Vitals Entered By: Emilio Math (June 23, 2011 10:31 AM)                  Current Allergies: No known allergies History of Present Illness Chief Complaint: Sore throat, left ear pain, cough x 1 day History of Present Illness: 55 Years Old Female complains of onset of cold symptoms for 1 days.  Kara Warren has been using no OTC meds. + sore throat + cough No pleuritic pain No wheezing + nasal congestion + post-nasal drainage + sinus pain/pressure No chest congestion No itchy/red eyes No earache No hemoptysis No SOB No chills/sweats No fever No nausea No vomiting No abdominal pain No diarrhea No skin rashes + fatigue No myalgias No headache   Current Meds MULTI FOR HER 50+  TABS (MULTIPLE VITAMINS-MINERALS) 1 qd CALCIUM 500/D 500-200 MG-UNIT TABS (CALCIUM CARBONATE-VITAMIN D) 1 qd B-100 COMPLEX  TABS (VITAMINS-LIPOTROPICS) 1 qd LEXAPRO 10 MG TABS (ESCITALOPRAM OXALATE)  PREDNISONE (PAK) 10 MG TABS (PREDNISONE) 6 day pack, use as directed  REVIEW OF SYSTEMS Constitutional Symptoms      Denies fever, chills, night sweats, weight loss, weight gain, and fatigue.  Eyes       Denies change in vision, eye pain, eye discharge, glasses, contact lenses, and eye surgery. Ear/Nose/Throat/Mouth       Complains of ear pain, sore throat, and hoarseness.      Denies hearing loss/aids, change in hearing, ear discharge, dizziness, frequent runny nose, frequent nose bleeds, sinus problems, and tooth pain or bleeding.  Respiratory       Complains of dry  cough.      Denies productive cough, wheezing, shortness of breath, asthma, bronchitis, and emphysema/COPD.  Cardiovascular       Denies murmurs, chest pain, and tires easily with exhertion.    Gastrointestinal       Denies stomach pain, nausea/vomiting, diarrhea, constipation, blood in bowel movements, and indigestion. Genitourniary       Denies painful urination, kidney stones, and loss of urinary control. Neurological       Denies paralysis, seizures, and fainting/blackouts. Musculoskeletal       Denies muscle pain, joint pain, joint stiffness, decreased range of motion, redness, swelling, muscle weakness, and gout.  Skin       Denies bruising, unusual mles/lumps or sores, and hair/skin or nail changes.  Psych       Denies mood changes, temper/anger issues, anxiety/stress, speech problems, depression, and sleep problems.  Past History:  Past Medical History: Reviewed history from 08/25/2008 and no changes required. Depression GYN- Dr Arelia Sneddon, annual exams in Dec  Past Surgical History: Reviewed history from 11/11/2009 and no changes required. D&C-2009 Uterine Artery Embolization  Family History: Reviewed history from 07/22/2008 and no changes required. Mother, Alive, Healthy Father, Alive, Heart Disease  Social History: Reviewed history from 07/22/2008 and no  changes required. 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 Regular exercise-yes Physical Exam General appearance: well developed, well nourished, no acute distress Ears: normal, no lesions or deformities Nasal: clear discharge Oral/Pharynx: tongue normal, posterior pharynx without erythema or exudate Neck: ant cerv tender Chest/Lungs: no rales, wheezes, or rhonchi bilateral, breath sounds equal without effort Heart: regular rate and  rhythm, no murmur MSE: oriented to time, place, and person Assessment New Problems: COUGH (ICD-786.2) UPPER RESPIRATORY INFECTION, ACUTE  (ICD-465.9)   Plan New Medications/Changes: PREDNISONE (PAK) 10 MG TABS (PREDNISONE) 6 day pack, use as directed  #1 x 0, 06/23/2011, Hoyt Koch MD  New Orders: Est. Patient Level III [99213] T-Culture, Throat [96295-28413] Rapid Strep [24401] Pulse Oximetry [94760] Planning Comments:   1)  No antibiotic given today, likely viral and just started yesterday.  If worsening, consider calling in cough meds or ABX.  Will start prednisone today.  Rapid strep neg, TCx pending. 2)  Use nasal saline solution (over the counter) at least 3 times a day. 3)  Use over the counter decongestants like Zyrtec-D every 12 hours as needed to help with congestion. 4)  Can take tylenol every 6 hours or motrin every 8 hours for pain or fever. 5)  Follow up with your primary doctor  if no improvement in 5-7 days, sooner if increasing pain, fever, or new symptoms.    The patient and/or caregiver has been counseled thoroughly with regard to medications prescribed including dosage, schedule, interactions, rationale for use, and possible side effects and they verbalize understanding.  Diagnoses and expected course of recovery discussed and will return if not improved as expected or if the condition worsens. Patient and/or caregiver verbalized understanding.  Prescriptions: PREDNISONE (PAK) 10 MG TABS (PREDNISONE) 6 day pack, use as directed  #1 x 0   Entered and Authorized by:   Hoyt Koch MD   Signed by:   Hoyt Koch MD on 06/23/2011   Method used:   Print then Give to Patient   RxID:   [id number]   Orders Added: 1)  Est. Patient Level III [59563] 2)  T-Culture, Throat [[id number]] 3)  Rapid Strep [18841] 4)  Pulse Oximetry [94760]    Laboratory Results  Date/Time Received: June 23, 2011 11:02 AM  Date/Time Reported: June 23, 2011 11:02 AM   Other Tests  Rapid Strep: negative

## 2011-12-20 ENCOUNTER — Encounter: Payer: Self-pay | Admitting: Internal Medicine

## 2011-12-20 ENCOUNTER — Ambulatory Visit (INDEPENDENT_AMBULATORY_CARE_PROVIDER_SITE_OTHER): Payer: Commercial Managed Care - PPO | Admitting: Internal Medicine

## 2011-12-20 ENCOUNTER — Other Ambulatory Visit (HOSPITAL_COMMUNITY): Admission: RE | Admit: 2011-12-20 | Payer: 59 | Source: Ambulatory Visit | Admitting: Internal Medicine

## 2011-12-20 VITALS — BP 110/70 | HR 59 | Temp 97.6°F | Ht 64.5 in | Wt 143.1 lb

## 2011-12-20 DIAGNOSIS — Z124 Encounter for screening for malignant neoplasm of cervix: Secondary | ICD-10-CM

## 2011-12-20 DIAGNOSIS — D126 Benign neoplasm of colon, unspecified: Secondary | ICD-10-CM

## 2011-12-20 DIAGNOSIS — Z1151 Encounter for screening for human papillomavirus (HPV): Secondary | ICD-10-CM

## 2011-12-20 DIAGNOSIS — R109 Unspecified abdominal pain: Secondary | ICD-10-CM

## 2011-12-20 DIAGNOSIS — Z780 Asymptomatic menopausal state: Secondary | ICD-10-CM

## 2011-12-20 DIAGNOSIS — N951 Menopausal and female climacteric states: Secondary | ICD-10-CM

## 2011-12-20 DIAGNOSIS — Z01419 Encounter for gynecological examination (general) (routine) without abnormal findings: Secondary | ICD-10-CM

## 2011-12-20 DIAGNOSIS — K635 Polyp of colon: Secondary | ICD-10-CM

## 2011-12-20 DIAGNOSIS — Z139 Encounter for screening, unspecified: Secondary | ICD-10-CM

## 2011-12-20 LAB — POCT URINALYSIS DIPSTICK
Bilirubin, UA: NEGATIVE
Blood, UA: NEGATIVE
Ketones, UA: NEGATIVE
Spec Grav, UA: <=1.005
pH, UA: 7

## 2011-12-20 NOTE — Patient Instructions (Signed)
Labs will be mailed to you  Lorenza Evangelist is ordered.  Schedule at first convienience

## 2011-12-20 NOTE — Progress Notes (Signed)
**Note De-Identified Kara Warren Obfuscation** Subjective:    Patient ID: Kara Warren, female    DOB: [birth year], 56 y.o.   MRN: [id number]  HPI Kara Warren is here for comprehensive eval and has a new problem of RLQ and pelvic pain for the past 2 months.    Pain described as 'achy"  Comes intermittantly.  She has a history of colon polyps.  No N/V/D no vaginal discharge.  Is not sexually active now.  No dysuria.   FH pos for colon CA in [facility].  No blood or change in color of stool  No Known Allergies Past Medical History  Diagnosis Date  . Colon polyp   . Menopause   . Migraine   . Endometrial polyp 8/08    benign  . Skene's gland abscess 2009  . Sinusitis, chronic    Past Surgical History  Procedure Date  . [place] 2007  . D&c 2009  . Dilation and curettage of uterus 8/08   History   Social History  . Marital Status: Divorced    Spouse Name: N/A    Number of Children: N/A  . 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 Smoking status: Former Smoker    Quit date: 10/02/1978  . Smokeless tobacco: Never Used  . Alcohol Use: 3.0 oz/week    5 Glasses of wine per week  . Drug Use: No  . Sexually Active: Not on file   Other Topics Concern  . Not on file   Social History Narrative  . No narrative on file   Family History  Problem Relation Age of Onset  . Breast cancer Paternal Grandmother   . Colon cancer Maternal Grandfather   . Colon polyps Father   . Ulcerative colitis Father   . Heart disease Father   . Glaucoma Father   . Hypertension Father   . Colitis Father   . Colon polyps Mother   . Osteoporosis Mother   . Thyroid disease Mother     hypo  . Glaucoma Mother   . Diverticulosis Mother   . Sudden death Neg Hx   . Hyperlipidemia Neg Hx   . Heart attack Neg Hx   . Diabetes Neg Hx    Patient Active Problem List  Diagnoses  . HYPERLIPIDEMIA  . NEUTROPENIA UNSPECIFIED  . DEPRESSION  . Acute sinusitis, unspecified  . RHINITIS  . LUNG NODULE    . HIP PAIN, RIGHT  . TRANSAMINASES, SERUM, ELEVATED  . Nonspecific (abnormal) findings on radiological and other examination of body structure  . NONSPCIFC ABN FINDING RAD & OTH EXAM LUNG FIELD  . Sinusitis, chronic  . Colon polyp  . Endometrial polyp  . Menopause  . Skene's gland abscess  . Migraine  . Right foot pain   Current Outpatient Prescriptions on File Prior to Visit  Medication Sig Dispense Refill  . calcium-vitamin D (CALCIUM 500+D) 500-200 MG-UNIT per tablet Take 1 tablet by mouth daily.        Marland Kitchen escitalopram (LEXAPRO) 10 MG tablet Take 5 mg by mouth daily.       Marland Kitchen loratadine (CLARITIN) 10 MG tablet Take 10 mg by mouth daily.        Marland Kitchen Lysine 500 MG TABS Take 1 tablet by mouth daily.        . Multiple Vitamins-Minerals (MULTI FOR HER 50+) TABS Take 1 tablet by mouth daily.        . Probiotic Product (PROBIOTIC  PO) Take 1 capsule by mouth daily.        . Psyllium-Calcium (METAMUCIL PLUS CALCIUM) CAPS Take 1 capsule by mouth daily.             Review of Systems  Constitutional: Negative.   HENT: Negative.   Eyes: Negative.   Respiratory: Negative.   Cardiovascular: Negative.   Gastrointestinal: Positive for abdominal pain. Negative for blood in stool and anal bleeding.  Genitourinary: Positive for pelvic pain.  Musculoskeletal: Negative.   Skin: Negative.   Neurological: Negative.        Objective:   Physical Exam Physical Exam  Vital signs and nursing note reviewed  Constitutional: She is oriented to person, place, and time. She appears well-developed and well-nourished. She is cooperative.  HENT:  Head: Normocephalic and atraumatic.  Right Ear: Tympanic membrane normal.  Left Ear: Tympanic membrane normal.  Nose: Nose normal.  Mouth/Throat: Oropharynx is clear and moist and mucous membranes are normal. No oropharyngeal exudate or posterior oropharyngeal erythema.  Eyes: Conjunctivae and EOM are normal. Pupils are equal, round, and reactive to light.   Neck: Neck supple. No JVD present. Carotid bruit is not present. No mass and no thyromegaly present.  Cardiovascular: Regular rhythm, normal heart sounds, intact distal pulses and normal pulses.  Exam reveals no gallop and no friction rub.   No murmur heard. Pulses:      Dorsalis pedis pulses are 2+ on the right side, and 2+ on the left side.  Pulmonary/Chest: Breath sounds normal. She has no wheezes. She has no rhonchi. She has no rales. Right breast exhibits no mass, no nipple discharge and no skin change. Left breast exhibits no mass, no nipple discharge and no skin change.  Abdominal: Soft. Bowel sounds are normal. She exhibits no distension and no mass. There is no hepatosplenomegaly. There is no tenderness. There is no CVA tenderness.  Genitourinary: Rectum normal, vagina normal and uterus normal. Rectal exam shows no mass. Guaiac negative stool. No labial fusion. There is no lesion on the right labia. There is no lesion on the left labia. Cervix exhibits no motion tenderness. Right adnexum displays no mass, no tenderness and no fullness. Left adnexum displays no mass, no tenderness and no fullness. No erythema around the vagina.  Musculoskeletal:       No active synovitis to any joint.    Lymphadenopathy:       Right cervical: No superficial cervical adenopathy present.      Left cervical: No superficial cervical adenopathy present.       Right axillary: No pectoral and no lateral adenopathy present.       Left axillary: No pectoral and no lateral adenopathy present.      Right: No inguinal adenopathy present.       Left: No inguinal adenopathy present.  Neurological: She is alert and oriented to person, place, and time. She has normal strength and normal reflexes. No cranial nerve deficit or sensory deficit. She displays a negative Romberg sign. Coordination and gait normal.  Skin: Skin is warm and dry. No abrasion, no bruising, no ecchymosis and no rash noted. No cyanosis. Nails show no  clubbing.  Psychiatric: She has a normal mood and affect. Her speech is normal and behavior is normal.          Assessment & Plan:  1)  Lower abd/pelvic pain  Will get pelvic/tvus .  Etiology unclear at this point.   2)  Health Maintenance  Will get complete labs  today.  REfer to GI for colonsoscopy 3)  MIgraine  Inactive problem now 4) Menopause:   5)  Mild hyperlipidemia  Will check today 7)  History of endometrial polyp.  S/P  ablation        Assessment & Plan:

## 2011-12-21 LAB — CBC WITH DIFFERENTIAL/PLATELET
Basophils Relative: 0 % (ref 0–1)
Eosinophils Absolute: 0.1 10*3/uL (ref 0.0–0.7)
HCT: 39.8 % (ref 36.0–46.0)
Hemoglobin: 13.2 g/dL (ref 12.0–15.0)
MCH: 32 pg (ref 26.0–34.0)
MCHC: 33.2 g/dL (ref 30.0–36.0)
MCV: 96.6 fL (ref 78.0–100.0)
Monocytes Absolute: 0.4 10*3/uL (ref 0.1–1.0)
Monocytes Relative: 8 % (ref 3–12)

## 2011-12-21 LAB — COMPREHENSIVE METABOLIC PANEL
Albumin: 4.8 g/dL (ref 3.5–5.2)
Alkaline Phosphatase: 51 U/L (ref 39–117)
BUN: 19 mg/dL (ref 6–23)
CO2: 30 mEq/L (ref 19–32)
Glucose, Bld: 90 mg/dL (ref 70–99)
Total Bilirubin: 0.6 mg/dL (ref 0.3–1.2)

## 2011-12-21 LAB — LIPID PANEL
Cholesterol: 196 mg/dL (ref 0–200)
HDL: 93 mg/dL (ref >39)
LDL Cholesterol: 86 mg/dL (ref 0–99)
Triglycerides: 85 mg/dL (ref <150)

## 2011-12-25 ENCOUNTER — Encounter: Payer: Self-pay | Admitting: Emergency Medicine

## 2011-12-26 ENCOUNTER — Encounter: Payer: Self-pay | Admitting: Emergency Medicine

## 2011-12-27 ENCOUNTER — Other Ambulatory Visit: Payer: 59 | Admitting: Emergency Medicine

## 2011-12-27 ENCOUNTER — Ambulatory Visit (HOSPITAL_BASED_OUTPATIENT_CLINIC_OR_DEPARTMENT_OTHER)
Admission: RE | Admit: 2011-12-27 | Discharge: 2011-12-27 | Disposition: A | Discharge status: Home / Self Care | Payer: 59 | Source: Ambulatory Visit | Attending: Internal Medicine | Admitting: Internal Medicine

## 2011-12-27 ENCOUNTER — Ambulatory Visit (INDEPENDENT_AMBULATORY_CARE_PROVIDER_SITE_OTHER)
Admission: RE | Admit: 2011-12-27 | Discharge: 2011-12-27 | Disposition: A | Discharge status: Home / Self Care | Payer: 59 | Source: Ambulatory Visit | Attending: Internal Medicine | Admitting: Internal Medicine

## 2011-12-27 DIAGNOSIS — N949 Unspecified condition associated with female genital organs and menstrual cycle: Secondary | ICD-10-CM

## 2011-12-27 DIAGNOSIS — R109 Unspecified abdominal pain: Secondary | ICD-10-CM | POA: Insufficient documentation

## 2011-12-27 DIAGNOSIS — Z139 Encounter for screening, unspecified: Secondary | ICD-10-CM

## 2011-12-28 ENCOUNTER — Encounter: Payer: Self-pay | Admitting: Internal Medicine

## 2011-12-28 ENCOUNTER — Telehealth: Payer: Self-pay | Admitting: Internal Medicine

## 2011-12-28 DIAGNOSIS — Z8262 Family history of osteoporosis: Secondary | ICD-10-CM | POA: Insufficient documentation

## 2011-12-28 HISTORY — DX: Family history of osteoporosis: Z82.62

## 2011-12-28 NOTE — Telephone Encounter (Signed)
Spoke with pt. And informed of U/S results.  RLQ pain comes and goes not constant and not increasing in intensity.  Will moniter for now  Counseled to call if any worsening

## 2012-01-02 ENCOUNTER — Telehealth: Payer: Self-pay | Admitting: Internal Medicine

## 2012-01-02 DIAGNOSIS — R109 Unspecified abdominal pain: Secondary | ICD-10-CM

## 2012-01-02 NOTE — Telephone Encounter (Signed)
Spoke with Kara Warren.  She states that she is concerned about continued right sided pain.  She continues to have "sticking" pain.  Pain is in the same place- to the right of her belly button, next to her hip.  Pain characteristic has not changed, but it is there everyday.  She is no better, no worse, but she is daily aware of the pain.  Nothing makes the pain better or worse- movement, eating, bowel movements, urination, ect.  She has an appointment to see Dr. Loreta Ave for colonoscopy, but she would like to know if she should consider any additional imaging before the colonoscopy such as a CT scan, ect?  She has a fear or concern of "it popping".  She states she finds herself holding her side throughout the day.  Pain has been consistently present for 2 months or so.  Would like to know what is causing the pain.  Aware I will speak to DDS and call her back with recommendations.

## 2012-01-02 NOTE — Telephone Encounter (Signed)
Left message on voice mail for Kara Warren to return call to the office to discuss symptoms further

## 2012-01-02 NOTE — Telephone Encounter (Signed)
Pt called still having right side pain.  Would like to have a CT Scan schedule. Please advise.  Pt call back phone number [phone].

## 2012-01-03 NOTE — Telephone Encounter (Signed)
Spoke with Kara Warren.  Transferred her to [facility] in imaging scheduling to schedule CT at her convenience

## 2012-01-03 NOTE — Telephone Encounter (Signed)
Per VO DDS, will order CT abd and pelvis with contrast.  LMOVM for Ethyle to call office to schedule appt

## 2012-01-05 ENCOUNTER — Ambulatory Visit (HOSPITAL_BASED_OUTPATIENT_CLINIC_OR_DEPARTMENT_OTHER)
Admission: RE | Admit: 2012-01-05 | Discharge: 2012-01-05 | Disposition: A | Discharge status: Home / Self Care | Payer: 59 | Source: Ambulatory Visit | Attending: Internal Medicine | Admitting: Internal Medicine

## 2012-01-05 DIAGNOSIS — R1031 Right lower quadrant pain: Secondary | ICD-10-CM | POA: Insufficient documentation

## 2012-01-05 DIAGNOSIS — R109 Unspecified abdominal pain: Secondary | ICD-10-CM

## 2012-01-05 MED ORDER — IOHEXOL 300 MG/ML  SOLN
100.0000 mL | Freq: Once | INTRAMUSCULAR | Status: AC | PRN
  Administered 2012-01-05: 100 mL via INTRAVENOUS

## 2012-01-09 ENCOUNTER — Encounter: Payer: Self-pay | Admitting: Internal Medicine

## 2012-01-09 ENCOUNTER — Telehealth: Payer: Self-pay | Admitting: Internal Medicine

## 2012-01-09 NOTE — Telephone Encounter (Signed)
Left message on cell phone x2 regarding CT results.  Left message that CT is normal and she can schedule OV with me if pain persists.    She did  See Dr. Loreta Ave

## 2012-01-09 NOTE — Telephone Encounter (Signed)
Left message on pt's cell phone to return call  Regarding CT results

## 2012-01-10 ENCOUNTER — Ambulatory Visit (HOSPITAL_BASED_OUTPATIENT_CLINIC_OR_DEPARTMENT_OTHER)
Admission: RE | Admit: 2012-01-10 | Discharge: 2012-01-10 | Disposition: A | Discharge status: Home / Self Care | Payer: 59 | Source: Ambulatory Visit | Attending: Internal Medicine | Admitting: Internal Medicine

## 2012-01-10 DIAGNOSIS — Z139 Encounter for screening, unspecified: Secondary | ICD-10-CM

## 2012-01-10 DIAGNOSIS — Z1231 Encounter for screening mammogram for malignant neoplasm of breast: Secondary | ICD-10-CM | POA: Insufficient documentation

## 2012-07-27 ENCOUNTER — Encounter: Payer: Self-pay | Admitting: Internal Medicine

## 2012-07-27 DIAGNOSIS — K5790 Diverticulosis of intestine, part unspecified, without perforation or abscess without bleeding: Secondary | ICD-10-CM | POA: Insufficient documentation

## 2012-07-27 HISTORY — DX: Diverticulosis of intestine, part unspecified, without perforation or abscess without bleeding: K57.90

## 2012-08-21 ENCOUNTER — Encounter: Payer: Self-pay | Admitting: *Deleted

## 2012-08-23 ENCOUNTER — Encounter: Payer: Self-pay | Admitting: *Deleted

## 2012-09-15 ENCOUNTER — Emergency Department
Admission: EM | Admit: 2012-09-15 | Discharge: 2012-09-15 | Disposition: A | Discharge status: Home / Self Care | Payer: 59 | Source: Home / Self Care | Attending: Emergency Medicine | Admitting: Emergency Medicine

## 2012-09-15 ENCOUNTER — Encounter: Payer: Self-pay | Admitting: *Deleted

## 2012-09-15 DIAGNOSIS — L03119 Cellulitis of unspecified part of limb: Secondary | ICD-10-CM

## 2012-09-15 DIAGNOSIS — L02519 Cutaneous abscess of unspecified hand: Secondary | ICD-10-CM

## 2012-09-15 DIAGNOSIS — S61409A Unspecified open wound of unspecified hand, initial encounter: Secondary | ICD-10-CM

## 2012-09-15 DIAGNOSIS — Z23 Encounter for immunization: Secondary | ICD-10-CM

## 2012-09-15 DIAGNOSIS — IMO0001 Reserved for inherently not codable concepts without codable children: Secondary | ICD-10-CM

## 2012-09-15 DIAGNOSIS — W5501XA Bitten by cat, initial encounter: Secondary | ICD-10-CM

## 2012-09-15 MED ORDER — DOXYCYCLINE HYCLATE 100 MG PO CAPS: 100.0000 mg | ORAL_CAPSULE | Freq: Two times a day (BID) | ORAL | Status: DC

## 2012-09-15 MED ORDER — TETANUS-DIPHTH-ACELL PERTUSSIS 5-2.5-18.5 LF-MCG/0.5 IM SUSP
0.5000 mL | Freq: Once | INTRAMUSCULAR | Status: AC
  Administered 2012-09-15: 0.5 mL via INTRAMUSCULAR

## 2012-09-15 NOTE — ED Provider Notes (Signed)
History     CSN: [id number]  Arrival date & time 09/15/12  1314   First MD Initiated Contact with Patient 09/15/12 1344      Chief Complaint  Patient presents with  . Animal Bite    (Consider location/radiation/quality/duration/timing/severity/associated sxs/prior treatment) HPI She was bit by her cat last night. Sleeping with cat and doesn't know if she rolled over and hit the cat or what, but was bit.  She got up and cleaned the wound.  She last had tetanus shot 6 years ago and came for an update in the shot.  No F/C, N/V.  Mild swelling and redness.  Feels constant throbbing and aggravated.  Not using any meds or modalities yet.  Past Medical History  Diagnosis Date  . Colon polyp   . Menopause   . Migraine   . Endometrial polyp 8/08    benign  . Skene's gland abscess 2009  . Sinusitis, chronic     Past Surgical History  Procedure Date  . [place] 2007  . D&c 2009  . Dilation and curettage of uterus 8/08    Family History  Problem Relation Age of Onset  . Breast cancer Paternal Grandmother   . Colon cancer Maternal Grandfather   . Colon polyps Father   . Ulcerative colitis Father   . Heart disease Father   . Glaucoma Father   . Hypertension Father   . Colitis Father   . Colon polyps Mother   . Osteoporosis Mother   . Thyroid disease Mother     hypo  . Glaucoma Mother   . Diverticulosis Mother   . Sudden death Neg Hx   . Hyperlipidemia Neg Hx   . Heart attack Neg Hx   . Diabetes Neg Hx     History  Substance Use Topics  . Smoking status: Former Smoker    Quit date: 10/02/1978  . Smokeless tobacco: Never Used  . Alcohol Use: 3.0 oz/week    5 Glasses of wine per week    OB History    Grav Para Term Preterm Abortions TAB SAB Ect Mult Living                  Review of Systems  All other systems reviewed and are negative.    Allergies  Review of patient's allergies indicates no known allergies.  Home Medications   Current Outpatient Rx    Name  Route  Sig  Dispense  Refill  . CALCIUM CARBONATE-VITAMIN D 500-200 MG-UNIT PO TABS   Oral   Take 1 tablet by mouth daily.           Marland Kitchen DOXYCYCLINE HYCLATE 100 MG PO CAPS   Oral   Take 1 capsule (100 mg total) by mouth 2 (two) times daily.   14 capsule   0   . ESCITALOPRAM OXALATE 10 MG PO TABS   Oral   Take 5 mg by mouth daily.          Marland Kitchen LORATADINE 10 MG PO TABS   Oral   Take 10 mg by mouth daily.           Marland Kitchen LYSINE 500 MG PO TABS   Oral   Take 1 tablet by mouth daily.           Malachy Mood FOR HER 50+ PO TABS   Oral   Take 1 tablet by mouth daily.           Marland Kitchen PROBIOTIC PO  Oral   Take 1 capsule by mouth daily.           Marland Kitchen METAMUCIL PLUS CALCIUM PO CAPS   Oral   Take 1 capsule by mouth daily.             BP 119/76  Pulse 67  Temp 98.2 F (36.8 C) (Oral)  Resp 16  Ht 5\' 5"  (1.651 m)  Wt 145 lb (65.772 kg)  BMI 24.13 kg/m2  SpO2 100%  Physical Exam  Nursing note and vitals reviewed. Constitutional: She is oriented to person, place, and time. She appears well-developed and well-nourished.  HENT:  Head: Normocephalic and atraumatic.  Eyes: No scleral icterus.  Neck: Neck supple.  Cardiovascular: Regular rhythm and normal heart sounds.   Pulmonary/Chest: Effort normal and breath sounds normal. No respiratory distress.  Neurological: She is alert and oriented to person, place, and time.  Skin: Skin is warm and dry.     Psychiatric: She has a normal mood and affect. Her speech is normal.    ED Course  Procedures (including critical care time)  Labs Reviewed - No data to display No results found.   1. Cat bite       MDM  Cat bite with mild cellulitis.  Will place her on Doxy x1 week and give her an updated Tetanus shot.  Can use cool compresses PRN.  If worsening symptoms, fever, pus, should follow up with PCP or here in UC.    Marlaine Hind, MD 09/15/12 [phone]

## 2012-09-15 NOTE — ED Notes (Signed)
Patient c/o cat bite occurred 3:30am this is her cat and is up to date on vaccines. Patient tetanus 6 years ago.

## 2012-11-22 ENCOUNTER — Encounter: Payer: Self-pay | Admitting: *Deleted

## 2012-11-22 ENCOUNTER — Emergency Department
Admission: EM | Admit: 2012-11-22 | Discharge: 2012-11-22 | Disposition: A | Discharge status: Home / Self Care | Payer: 59 | Source: Home / Self Care | Attending: Family Medicine | Admitting: Family Medicine

## 2012-11-22 DIAGNOSIS — J069 Acute upper respiratory infection, unspecified: Secondary | ICD-10-CM

## 2012-11-22 DIAGNOSIS — M940 Chondrocostal junction syndrome [Tietze]: Secondary | ICD-10-CM

## 2012-11-22 MED ORDER — FLUTICASONE PROPIONATE 50 MCG/ACT NA SUSP: 2.0000 | Freq: Every day | NASAL | Status: DC

## 2012-11-22 MED ORDER — BENZONATATE 100 MG PO CAPS: ORAL_CAPSULE | ORAL | Status: DC

## 2012-11-22 MED ORDER — AMOXICILLIN 875 MG PO TABS: 875.0000 mg | ORAL_TABLET | Freq: Two times a day (BID) | ORAL | Status: DC

## 2012-11-22 MED ORDER — PREDNISONE 20 MG PO TABS: 20.0000 mg | ORAL_TABLET | Freq: Two times a day (BID) | ORAL | Status: DC

## 2012-11-22 NOTE — ED Provider Notes (Signed)
History     CSN: [id number]  Arrival date & time 11/22/12  1056   First MD Initiated Contact with Patient 11/22/12 1114      Chief Complaint  Patient presents with  . Sinus Problem  . Cough      HPI Comments: Three days ago patient developed fatigue, myalgias, sinus congestion, and eye redness but no sore throat.  Last night she developed a cough and anterior chest tightness.  No fevers, chills, and sweats.  She has a past history of pneumonia.  The history is provided by the patient.    Past Medical History  Diagnosis Date  . Colon polyp   . Menopause   . Migraine   . Endometrial polyp 8/08    benign  . Skene's gland abscess 2009  . Sinusitis, chronic     Past Surgical History  Procedure Laterality Date  . [place]  2007  . D&c  2009  . Dilation and curettage of uterus  8/08    Family History  Problem Relation Age of Onset  . Breast cancer Paternal Grandmother   . Colon cancer Maternal Grandfather   . Colon polyps Father   . Ulcerative colitis Father   . Heart disease Father   . Glaucoma Father   . Hypertension Father   . Colitis Father   . Colon polyps Mother   . Osteoporosis Mother   . Thyroid disease Mother     hypo  . Glaucoma Mother   . Diverticulosis Mother   . Sudden death Neg Hx   . Hyperlipidemia Neg Hx   . Heart attack Neg Hx   . Diabetes Neg Hx     History  Substance Use Topics  . Smoking status: Former Smoker    Quit date: 10/02/1978  . Smokeless tobacco: Never Used  . Alcohol Use: 3.0 oz/week    5 Glasses of wine per week     Comment: per week    OB History   Grav Para Term Preterm Abortions TAB SAB Ect Mult Living                  Review of Systems No sore throat + cough No pleuritic pain No wheezing + nasal congestion + post-nasal drainage No sinus pain/pressure No itchy/red eyes No earache No hemoptysis No SOB No fever/chills No nausea No vomiting No abdominal pain No diarrhea No urinary symptoms No skin  rashes + fatigue + myalgias + headache Used OTC meds without relief  Allergies  Review of patient's allergies indicates no known allergies.  Home Medications   Current Outpatient Rx  Name  Route  Sig  Dispense  Refill  . amoxicillin (AMOXIL) 875 MG tablet   Oral   Take 1 tablet (875 mg total) by mouth 2 (two) times daily. (Rx void after 11/30/12)   20 tablet   0   . benzonatate (TESSALON) 100 MG capsule      Take one cap at bedtime as necessary for cough   12 capsule   0   . calcium-vitamin D (CALCIUM 500+D) 500-200 MG-UNIT per tablet   Oral   Take 1 tablet by mouth daily.           Marland Kitchen escitalopram (LEXAPRO) 10 MG tablet   Oral   Take 5 mg by mouth daily.          Marland Kitchen loratadine (CLARITIN) 10 MG tablet   Oral   Take 10 mg by mouth daily.           Marland Kitchen  Lysine 500 MG TABS   Oral   Take 1 tablet by mouth daily.           . Multiple Vitamins-Minerals (MULTI FOR HER 50+) TABS   Oral   Take 1 tablet by mouth daily.           . predniSONE (DELTASONE) 20 MG tablet   Oral   Take 1 tablet (20 mg total) by mouth 2 (two) times daily. Take with food.   10 tablet   0   . Probiotic Product (PROBIOTIC PO)   Oral   Take 1 capsule by mouth daily.           . Psyllium-Calcium (METAMUCIL PLUS CALCIUM) CAPS   Oral   Take 1 capsule by mouth daily.             BP 114/78  Pulse 65  Temp(Src) 98.2 F (36.8 C) (Oral)  Wt 148 lb (67.132 kg)  BMI 24.63 kg/m2  SpO2 100%  Physical Exam Nursing notes and Vital Signs reviewed. Appearance:  Patient appears healthy, stated age, and in no acute distress Eyes:  Pupils are equal, round, and reactive to light and accomodation.  Extraocular movement is intact.  Conjunctivae are not inflamed  Ears:  Canals normal.  Tympanic membranes normal.  Nose:  Mildly congested turbinates.  No sinus tenderness.  Pharynx:  Normal Neck:  Supple.  Slightly tender shotty posterior nodes are palpated bilaterally  Lungs:  Clear to  auscultation.  Breath sounds are equal. Chest:  Distinct tenderness to palpation over the mid-sternum.   Heart:  Regular rate and rhythm without murmurs, rubs, or gallops.  Abdomen:  Nontender without masses or hepatosplenomegaly.  Bowel sounds are present.  No CVA or flank tenderness.  Extremities:  No edema.  No calf tenderness Skin:  No rash present.   ED Course  Procedures  none      1. Acute upper respiratory infections of unspecified site; suspect early viral URI   2. Costochondritis, acute       MDM  There is no evidence of bacterial infection today.   Treat symptomatically for now:  Prescription written for Benzonatate [facility]) to take at bedtime for night-time cough.  Prednisone burst.  Begin fluticasone nasal spray. Take plain Mucinex (guaifenesin) twice daily for cough and congestion.  Increase fluid intake, rest. May use Afrin nasal spray (or generic oxymetazoline) twice daily for about 5 days.  Also recommend using saline nasal spray several times daily and saline nasal irrigation (AYR is a common brand).  Use fluticasone spray after Afrin and saline irrigation Stop all antihistamines for now, and other non-prescription cough/cold preparations. Begin Amoxicillin if not improving about 5 days or if persistent fever develops (Given a prescription to hold, with an expiration date)  Follow-up with family doctor if not improving 7 to 10 days.          Lattie Haw, MD 11/23/12 [phone]

## 2012-11-22 NOTE — ED Notes (Signed)
Ebelin c/o sinus pain and drainage with dry cough x 3 days. Taken tylenol and Mucinex OTC. Last tylenol dose @ 7am

## 2012-12-09 ENCOUNTER — Other Ambulatory Visit: Payer: Self-pay | Admitting: *Deleted

## 2012-12-09 ENCOUNTER — Other Ambulatory Visit: Payer: Self-pay | Admitting: Internal Medicine

## 2012-12-09 MED ORDER — ESCITALOPRAM OXALATE 10 MG PO TABS: 5.0000 mg | ORAL_TABLET | Freq: Every day | ORAL | Status: DC

## 2012-12-09 NOTE — Telephone Encounter (Signed)
Refill request

## 2012-12-09 NOTE — Telephone Encounter (Signed)
Pt requesting that rx now go to [facility]. Will call in RX

## 2012-12-09 NOTE — Telephone Encounter (Signed)
Pt needs refill on escitalopram (LEXAPRO) 10 MG tablet  sent down stairs [facility]... She only takes 5mg  a day... If there any questions pls call pt at [phone]

## 2013-02-04 ENCOUNTER — Other Ambulatory Visit: Payer: Self-pay | Admitting: *Deleted

## 2013-02-04 DIAGNOSIS — Z780 Asymptomatic menopausal state: Secondary | ICD-10-CM

## 2013-02-04 DIAGNOSIS — E785 Hyperlipidemia, unspecified: Secondary | ICD-10-CM

## 2013-02-04 DIAGNOSIS — J984 Other disorders of lung: Secondary | ICD-10-CM

## 2013-02-04 DIAGNOSIS — D709 Neutropenia, unspecified: Secondary | ICD-10-CM

## 2013-02-04 DIAGNOSIS — Z8262 Family history of osteoporosis: Secondary | ICD-10-CM

## 2013-02-04 DIAGNOSIS — Z139 Encounter for screening, unspecified: Secondary | ICD-10-CM

## 2013-02-12 ENCOUNTER — Ambulatory Visit (HOSPITAL_BASED_OUTPATIENT_CLINIC_OR_DEPARTMENT_OTHER)
Admission: RE | Admit: 2013-02-12 | Discharge: 2013-02-12 | Disposition: A | Discharge status: Home / Self Care | Payer: 59 | Source: Ambulatory Visit | Attending: Internal Medicine | Admitting: Internal Medicine

## 2013-02-12 DIAGNOSIS — Z1231 Encounter for screening mammogram for malignant neoplasm of breast: Secondary | ICD-10-CM | POA: Insufficient documentation

## 2013-02-13 ENCOUNTER — Telehealth: Payer: Self-pay | Admitting: *Deleted

## 2013-02-13 NOTE — Telephone Encounter (Signed)
Message copied by Gerome Apley on Thu Feb 13, 2013 12:57 PM ------      Message from: Raechel Chute D      Created: Thu Feb 13, 2013 10:26 AM       Bonita Quin              Call [place] and let her know that her Roderick Pee is negative ------

## 2013-02-13 NOTE — Telephone Encounter (Signed)
Called Kara [facility] and left a message we are calling with some information , please call clinic tomorrow

## 2013-02-17 ENCOUNTER — Telehealth: Payer: Self-pay | Admitting: *Deleted

## 2013-02-17 NOTE — Telephone Encounter (Signed)
Patient returned call; and I gave her results of her negative mammogram.

## 2013-02-18 ENCOUNTER — Encounter: Payer: 59 | Admitting: Internal Medicine

## 2013-02-26 ENCOUNTER — Encounter (HOSPITAL_COMMUNITY): Payer: Self-pay | Admitting: Emergency Medicine

## 2013-02-26 ENCOUNTER — Encounter (HOSPITAL_COMMUNITY): Payer: Self-pay | Admitting: *Deleted

## 2013-02-26 ENCOUNTER — Emergency Department (HOSPITAL_COMMUNITY)
Admission: EM | Admit: 2013-02-26 | Discharge: 2013-02-26 | Disposition: A | Discharge status: Nursing Home (Includes ICF) | Payer: 59 | Source: Home / Self Care

## 2013-02-26 ENCOUNTER — Emergency Department (HOSPITAL_COMMUNITY)
Admission: EM | Admit: 2013-02-26 | Discharge: 2013-02-26 | Disposition: A | Discharge status: Home / Self Care | Payer: 59 | Attending: Emergency Medicine | Admitting: Emergency Medicine

## 2013-02-26 ENCOUNTER — Emergency Department (HOSPITAL_COMMUNITY): Payer: 59

## 2013-02-26 DIAGNOSIS — Z8742 Personal history of other diseases of the female genital tract: Secondary | ICD-10-CM | POA: Insufficient documentation

## 2013-02-26 DIAGNOSIS — Z8719 Personal history of other diseases of the digestive system: Secondary | ICD-10-CM | POA: Insufficient documentation

## 2013-02-26 DIAGNOSIS — J329 Chronic sinusitis, unspecified: Secondary | ICD-10-CM | POA: Insufficient documentation

## 2013-02-26 DIAGNOSIS — Z79899 Other long term (current) drug therapy: Secondary | ICD-10-CM | POA: Insufficient documentation

## 2013-02-26 DIAGNOSIS — R079 Chest pain, unspecified: Secondary | ICD-10-CM | POA: Insufficient documentation

## 2013-02-26 DIAGNOSIS — N951 Menopausal and female climacteric states: Secondary | ICD-10-CM | POA: Insufficient documentation

## 2013-02-26 DIAGNOSIS — Z87891 Personal history of nicotine dependence: Secondary | ICD-10-CM | POA: Insufficient documentation

## 2013-02-26 LAB — URINALYSIS, ROUTINE W REFLEX MICROSCOPIC
Bilirubin Urine: NEGATIVE
Glucose, UA: NEGATIVE mg/dL
Hgb urine dipstick: NEGATIVE
Ketones, ur: NEGATIVE mg/dL
Leukocytes, UA: NEGATIVE
Nitrite: NEGATIVE
Protein, ur: NEGATIVE mg/dL
Specific Gravity, Urine: 1.005 (ref 1.005–1.030)
Urobilinogen, UA: 0.2 mg/dL (ref 0.0–1.0)
pH: 6 (ref 5.0–8.0)

## 2013-02-26 LAB — POCT I-STAT TROPONIN I
Troponin i, poc: 0 ng/mL (ref 0.00–0.08)
Troponin i, poc: 0 ng/mL (ref 0.00–0.08)

## 2013-02-26 MED ORDER — GI COCKTAIL ~~LOC~~
30.0000 mL | Freq: Once | ORAL | Status: AC
  Administered 2013-02-26: 30 mL via ORAL
  Filled 2013-02-26: qty 30

## 2013-02-26 MED ORDER — ASPIRIN 81 MG PO CHEW
324.0000 mg | CHEWABLE_TABLET | Freq: Once | ORAL | Status: AC
  Administered 2013-02-26: 324 mg via ORAL
  Filled 2013-02-26: qty 4

## 2013-02-26 NOTE — ED Provider Notes (Signed)
History     CSN: [id number]  Arrival date & time 02/26/13  1454   None     Chief Complaint  Patient presents with  . Chest Pain  . GI Problem    (Consider location/radiation/quality/duration/timing/severity/associated sxs/prior treatment) HPI Comments: 57 year old female nondiabetic, here complaining of left-sided chest pain intermittently since noon time today. Pain was associated with abdominal cramping and "indigestion-like" symptoms. Denies vomiting or diarrhea. No diaphoresis. Pain is intermittent but persistent. Denies shortness of breath or dizziness.    Past Medical History  Diagnosis Date  . Colon polyp   . Menopause   . Migraine   . Endometrial polyp 8/08    benign  . Skene's gland abscess 2009  . Sinusitis, chronic     Past Surgical History  Procedure Laterality Date  . [place]  2007  . D&c  2009  . Dilation and curettage of uterus  8/08    Family History  Problem Relation Age of Onset  . Breast cancer Paternal Grandmother   . Colon cancer Maternal Grandfather   . Colon polyps Father   . Ulcerative colitis Father   . Heart disease Father   . Glaucoma Father   . Hypertension Father   . Colitis Father   . Colon polyps Mother   . Osteoporosis Mother   . Thyroid disease Mother     hypo  . Glaucoma Mother   . Diverticulosis Mother   . Sudden death Neg Hx   . Hyperlipidemia Neg Hx   . Heart attack Neg Hx   . Diabetes Neg Hx     History  Substance Use Topics  . Smoking status: Former Smoker    Quit date: 10/02/1978  . Smokeless tobacco: Never Used  . Alcohol Use: 4.2 oz/week    7 Glasses of wine per week     Comment: per week    OB History   Grav Para Term Preterm Abortions TAB SAB Ect Mult Living                  Review of Systems  Constitutional: Negative for fever, chills and diaphoresis.  Cardiovascular: Positive for chest pain.  Gastrointestinal: Negative for nausea, vomiting and diarrhea.  Neurological: Negative for dizziness.  All  other systems reviewed and are negative.    Allergies  Review of patient's allergies indicates no known allergies.  Home Medications   Current Outpatient Rx  Name  Route  Sig  Dispense  Refill  . escitalopram (LEXAPRO) 10 MG tablet   Oral   Take 0.5 tablets (5 mg total) by mouth daily.   90 tablet   1   . predniSONE (DELTASONE) 20 MG tablet   Oral   Take 1 tablet (20 mg total) by mouth 2 (two) times daily. Take with food.   10 tablet   0   . Psyllium-Calcium (METAMUCIL PLUS CALCIUM) CAPS   Oral   Take 1 capsule by mouth daily.           Marland Kitchen amoxicillin (AMOXIL) 875 MG tablet   Oral   Take 1 tablet (875 mg total) by mouth 2 (two) times daily. (Rx void after 11/30/12)   20 tablet   0   . benzonatate (TESSALON) 100 MG capsule      Take one cap at bedtime as necessary for cough   12 capsule   0   . calcium-vitamin D (CALCIUM 500+D) 500-200 MG-UNIT per tablet   Oral   Take 1 tablet by mouth daily.           Marland Kitchen  fluticasone (FLONASE) 50 MCG/ACT nasal spray   Nasal   Place 2 sprays into the nose daily.   16 g   0   . loratadine (CLARITIN) 10 MG tablet   Oral   Take 10 mg by mouth daily.           Marland Kitchen Lysine 500 MG TABS   Oral   Take 1 tablet by mouth daily.           . Multiple Vitamins-Minerals (MULTI FOR HER 50+) TABS   Oral   Take 1 tablet by mouth daily.           . Probiotic Product (PROBIOTIC PO)   Oral   Take 1 capsule by mouth daily.             BP 149/92  Pulse 59  Temp(Src) 98.4 F (36.9 C) (Oral)  Resp 18  SpO2 98%  Physical Exam  Nursing note and vitals reviewed. Constitutional: She is oriented to person, place, and time. She appears well-developed and well-nourished. No distress.  Appears calmed  HENT:  Mouth/Throat: Oropharynx is clear and moist.  Eyes: No scleral icterus.  Neck: No JVD present. No thyromegaly present.  Cardiovascular: Normal rate, regular rhythm and normal heart sounds.  Exam reveals no gallop and no  friction rub.   No murmur heard. Mild bradycardia  No LEE  Pulmonary/Chest: Effort normal and breath sounds normal. No respiratory distress. She has no wheezes. She has no rales. She exhibits no tenderness.  Abdominal: Soft. She exhibits no mass. There is no rebound and no guarding.  Neurological: She is alert and oriented to person, place, and time.  Skin: She is not diaphoretic.    ED Course  Procedures (including critical care time)  Labs Reviewed - No data to display No results found.   1. Chest pain     EKG: Sinus bradycardia with a ventricular rate at 55 beats a minute no ST or other acute ischemic changes.    MDM  57 year old female nondiabetic, here c/o intermitent left-sided chest pain since noon time today.  On exam: Vital signs stable with mild bradycardia with a heart rate at 55. Blood pressure 149/92 O2 sat 98%. Patient alert and oriented. Normal cardiovascular examination, clear lungs. Abdomen is soft. Decided to transfer to the emergency department for further evaluation and management.         Sharin Grave, MD 02/26/13 1538

## 2013-02-26 NOTE — ED Provider Notes (Signed)
Presents with left-sided chest pain anterior immediately under left breast onset this afternoon while eating lunch. Pain lasts a few seconds at a time. Consult times per hour. Presently asymptomatic. Patient exercises regularly does aerobic exercise several times per week without chest discomfort. On exam no distress lungs clear auscultation heart regular rate and rhythm abdomen nondistended nontender extremities without edema  Doug Sou, MD 02/27/13 4540

## 2013-02-26 NOTE — ED Notes (Signed)
Reports intermittent midsternal slightly to left side pain onset this afternoon. Pain non radiating, non exertional. Denies SOB, n/v, diaphoresis, fever, cold, cough. States episodes of CP last only a few seconds at a time. Pt reports exercises regularly & had recently done a class at gym which required push ups. Presently denies CP. Resp e/u, no distress

## 2013-02-26 NOTE — ED Notes (Signed)
Pt c/o she has not felt well this PM, pain mid chest, upset stomach; wants to be checked out to be sure this is not her heart

## 2013-02-26 NOTE — ED Notes (Signed)
From [facility] - c/o intermittent SSCP onset this afternoon. Denies SOB, n/v, diaphoresis. IV established

## 2013-02-26 NOTE — ED Notes (Signed)
The patient is AOx4 and comfortable with her discharge instructions. 

## 2013-02-26 NOTE — ED Notes (Signed)
Spoke to first nurse at [facility], [place] informed of pts condition

## 2013-02-26 NOTE — ED Notes (Signed)
Pt c/o chest pain onset this afternoon. Thought it was indigestion due to GI symptoms. Feels a tightness and pressure in the left side of the chest. No hx of heart problems. Patient is alert and oriented.

## 2013-02-26 NOTE — ED Provider Notes (Signed)
History     CSN: [id number]  Arrival date & time 02/26/13  1609   First MD Initiated Contact with Patient 02/26/13 1619      Chief Complaint  Patient presents with  . Chest Pain    HPI 57 year old female who doesn't get vasculature presents complaining of chest pain. Onset was approximately 4 hours ago while eating lunch. She states she is eating salad when she developed chest pains her chest and radiated to the left side of her chest. She describes it as a sharp pain that is intermittent. It comes on lasts for less than 1 minute, and then it goes away completely and, but it recurs frequently, several times over a five-minute period. She denies any shortness of breath, nausea, vomiting, diaphoresis, back pain, abdominal pain, or any other significant symptoms. She stretch her pain is moderate in severity, aggravated by nothing. Is not aggravated by breathing, cough, positions, palpation. It is also been relieved by nothing. During treatment she tried his Zantac.  She was a cigarette smoker remotely, less than 5-pack-year smoking history. Her elderly father had coronary artery disease diagnosed in his 30s, however she has no other family history of heart disease. She is an avid runner and is very active, works out daily both cardiovascular and lifting weights. She states she was able to complete her normal workout in a gym yesterday and she was also able to run this morning with no discomfort whatsoever.  She went to urgent care prior to evaluation, and EKG at urgent care was reportedly nonischemic. No further workup was conducted and she was sent here for further evaluation.   Past Medical History  Diagnosis Date  . Colon polyp   . Menopause   . Migraine   . Endometrial polyp 8/08    benign  . Skene's gland abscess 2009  . Sinusitis, chronic     Past Surgical History  Procedure Laterality Date  . [place]  2007  . D&c  2009  . Dilation and curettage of uterus  8/08    Family History   Problem Relation Age of Onset  . Breast cancer Paternal Grandmother   . Colon cancer Maternal Grandfather   . Colon polyps Father   . Ulcerative colitis Father   . Heart disease Father   . Glaucoma Father   . Hypertension Father   . Colitis Father   . Colon polyps Mother   . Osteoporosis Mother   . Thyroid disease Mother     hypo  . Glaucoma Mother   . Diverticulosis Mother   . Sudden death Neg Hx   . Hyperlipidemia Neg Hx   . Heart attack Neg Hx   . Diabetes Neg Hx     History  Substance Use Topics  . Smoking status: Former Smoker    Quit date: 10/02/1978  . Smokeless tobacco: Never Used  . Alcohol Use: 4.2 oz/week    7 Glasses of wine per week     Comment: per week    OB History   Grav Para Term Preterm Abortions TAB SAB Ect Mult Living                  Review of Systems  Constitutional: Negative for fever, chills and diaphoresis.  HENT: Negative for congestion, rhinorrhea, neck pain and neck stiffness.   Respiratory: Negative for cough, shortness of breath and wheezing.   Cardiovascular: Positive for chest pain. Negative for leg swelling.  Gastrointestinal: Negative for nausea, vomiting, abdominal pain and  diarrhea.  Genitourinary: Negative for dysuria, urgency, frequency, flank pain, vaginal bleeding, vaginal discharge and difficulty urinating.  Skin: Negative for rash.  Neurological: Negative for weakness, numbness and headaches.  All other systems reviewed and are negative.    Allergies  Review of patient's allergies indicates no known allergies.  Home Medications   Current Outpatient Rx  Name  Route  Sig  Dispense  Refill  . BLACK COHOSH EXTRACT PO   Oral   Take 1 tablet by mouth.         . calcium-vitamin D (CALCIUM 500+D) 500-200 MG-UNIT per tablet   Oral   Take 1 tablet by mouth daily.           Marland Kitchen escitalopram (LEXAPRO) 10 MG tablet   Oral   Take 0.5 tablets (5 mg total) by mouth daily.   90 tablet   1   . loratadine (CLARITIN) 10  MG tablet   Oral   Take 10 mg by mouth daily.           Marland Kitchen Lysine 500 MG TABS   Oral   Take 1 tablet by mouth daily.           . Multiple Vitamins-Minerals (MULTI FOR HER 50+) TABS   Oral   Take 1 tablet by mouth daily.           . Probiotic Product (PROBIOTIC PO)   Oral   Take 1 capsule by mouth daily. Called ultra spectrum         . Psyllium-Calcium (METAMUCIL PLUS CALCIUM) CAPS   Oral   Take 1 capsule by mouth daily.             BP 130/78  Pulse 57  Temp(Src) 98.2 F (36.8 C) (Oral)  Resp 20  SpO2 97%  Physical Exam  Nursing note and vitals reviewed. Constitutional: She is oriented to person, place, and time. She appears well-developed and well-nourished. No distress.  HENT:  Head: Normocephalic and atraumatic.  Mouth/Throat: Oropharynx is clear and moist.  Eyes: Conjunctivae and EOM are normal. Pupils are equal, round, and reactive to light. No scleral icterus.  Neck: Normal range of motion. Neck supple. No JVD present.  Cardiovascular: Normal rate, regular rhythm, normal heart sounds and intact distal pulses.  Exam reveals no gallop and no friction rub.   No murmur heard. Pulmonary/Chest: Effort normal and breath sounds normal. No respiratory distress. She has no wheezes. She has no rales.  Abdominal: Soft. Bowel sounds are normal. She exhibits no distension. There is no tenderness. There is no rebound and no guarding.  Musculoskeletal: She exhibits no edema.  Neurological: She is alert and oriented to person, place, and time. No cranial nerve deficit. She exhibits normal muscle tone. Coordination normal.  Skin: Skin is warm and dry. She is not diaphoretic.    ED Course  Procedures (including critical care time)  Labs Reviewed  URINALYSIS, ROUTINE W REFLEX MICROSCOPIC  POCT I-STAT TROPONIN I  POCT I-STAT TROPONIN I   Dg Chest 2 View  02/26/2013   *RADIOLOGY REPORT*  Clinical Data: Left-sided chest tightness , cough  CHEST - 2 VIEW  Comparison:  Chest x-ray of 11/19/2009 and CT chest of the same date  Findings: No active infiltrate or effusion is seen.  Mediastinal contours are stable.  The heart is borderline enlarged.  No bony abnormality is seen.  IMPRESSION: Stable chest x-ray.  No active lung disease.   Original Report Authenticated By: Dwyane Dee, M.D.  Date: 02/27/2013  Rate: 63  Rhythm: normal sinus rhythm  QRS Axis: normal  Intervals: normal  ST/T Wave abnormalities: nonspecific T wave changes  Conduction Disutrbances:none  Narrative Interpretation:   Old EKG Reviewed: unchanged    1. Chest pain       MDM  57 year old female with no significant past medical history, no risk factors for coronary disease presents with highly atypical chest pain. She has a nonischemic EKG. She has no risk factors for PE. No family history for early coronary disease.  My impression is that this is very atypical chest pain. I do not suspect a pulmonary embolus, aortic dissection, or any other emergent cause of her chest pain.  I discussed with the patient that given her symptoms I think she is very low risk for coronary artery disease as being the cause of her chest pain. However I told her that I could not definitively rule this out with absolute certainty. Offered her discharge with close followup versus serial troponin testing to further risk stratify. She elected to undergo serial troponins X 2 after full discussion.  She understands that with 2 negative troponins, a Heart score less than 3, she is risk stratified to very low risk category for ACS.  Obtained a stat troponin and repeat troponin at 3 hours and both are undetectable.  I referred her to cardiology for further evaluation. Gave return precautions. She is to follow with her primary care physician in the interim or return here for worrisome symptoms. this chest pain is likely musculoskeletal and I advised Motrin for symptom control.         Toney Sang, MD 02/27/13  [phone]

## 2013-02-26 NOTE — ED Notes (Signed)
Patient transported to X-ray 

## 2013-02-27 ENCOUNTER — Telehealth: Payer: Self-pay | Admitting: Physician Assistant

## 2013-02-27 NOTE — Telephone Encounter (Signed)
Overnight cardiology fellow left msg on book requesting f/u for [place] for this patient for atypical CP. I left msg on our scheduler's voicemail to call pt with appt. Destan Franchini PA-C

## 2013-02-27 NOTE — ED Provider Notes (Signed)
I have personally seen and examined the patient.  I have discussed the plan of care with the resident.  I have reviewed the documentation on PMH/FH/Soc. History.  I have reviewed the documentation of the resident and agree.  Doug Sou, MD 02/27/13 1108

## 2013-02-28 ENCOUNTER — Encounter: Payer: Self-pay | Admitting: Cardiovascular Disease

## 2013-02-28 ENCOUNTER — Ambulatory Visit (INDEPENDENT_AMBULATORY_CARE_PROVIDER_SITE_OTHER): Payer: 59 | Admitting: Cardiovascular Disease

## 2013-02-28 VITALS — BP 118/78 | HR 61 | Ht 65.0 in | Wt 147.1 lb

## 2013-02-28 DIAGNOSIS — R079 Chest pain, unspecified: Secondary | ICD-10-CM

## 2013-02-28 MED ORDER — PANTOPRAZOLE SODIUM 40 MG PO TBEC: 40.0000 mg | DELAYED_RELEASE_TABLET | Freq: Every day | ORAL | Status: DC

## 2013-02-28 NOTE — Progress Notes (Signed)
History of Present Illness: 57 yo female with history of migraine headaches here today as a new patient for cardiac evaluation. She has no prior cardiac issues. She was seen in the [facility] ED 02/26/13 with c/o constant left sided chest pain. Troponin negative x 2. EKG without ischemic changes. She was discharged home with plans for close cardiology f/u. She is a runner and exercises frequently. She has had no exercise induced chest pain. Her pain in 02/26/13 started after a meal, persisted for hours, not worsened with exertion. This felt like a "spasm" in the center of her chest with pressure. She felt slight nausea. She does have a history of GERD with prior endoscopy with small hiatal hernia. She took a Zantac with no resolution of pain. Her pain continued for 24 hours. She has no chest pain today.   Primary Care Physician: Schoenhoff  Last Lipid Profile:Lipid Panel     Component Value Date/Time   CHOL 196 12/20/2011 1435   TRIG 85 12/20/2011 1435   HDL 93 12/20/2011 1435   CHOLHDL 2.1 12/20/2011 1435   VLDL 17 12/20/2011 1435   LDLCALC 86 12/20/2011 1435     Past Medical History  Diagnosis Date  . Colon polyp   . Menopause   . Migraine   . Endometrial polyp 8/08    benign  . Skene's gland abscess 2009  . Sinusitis, chronic     Past Surgical History  Procedure Laterality Date  . Uterine ablation  2007  . Dilation and curettage of uterus  8/08    Current Outpatient Prescriptions  Medication Sig Dispense Refill  . BLACK COHOSH EXTRACT PO Take 1 tablet by mouth.      . calcium-vitamin D (CALCIUM 500+D) 500-200 MG-UNIT per tablet Take 1 tablet by mouth daily.        Marland Kitchen escitalopram (LEXAPRO) 10 MG tablet Take 0.5 tablets (5 mg total) by mouth daily.  90 tablet  1  . loratadine (CLARITIN) 10 MG tablet Take 10 mg by mouth as needed.       Marland Kitchen Lysine 500 MG TABS Take 1 tablet by mouth daily.        . Multiple Vitamins-Minerals (MULTI FOR HER 50+) TABS Take 1 tablet by mouth daily.        .  Probiotic Product (PROBIOTIC PO) Take 1 capsule by mouth daily. Called ultra spectrum      . Psyllium-Calcium (METAMUCIL PLUS CALCIUM) CAPS Take 1 capsule by mouth daily.         No current facility-administered medications for this visit.    No Known Allergies  History   Social History  . Marital Status: Divorced    Spouse Name: N/A    Number of Children: 0  . Years of Education: N/A   Occupational History  . RN [employer]   Social History Main Topics  . Smoking status: Former Smoker -- 0.10 packs/day for 3 years    Types: Cigarettes    Quit date: 10/02/1978  . Smokeless tobacco: Never Used  . Alcohol Use: 4.2 oz/week    7 Glasses of wine per week     Comment: per week  . Drug Use: No  . Sexually Active: Not on file   Other Topics Concern  . Not on file   Social History Narrative  . No narrative on file    Family History  Problem Relation Age of Onset  . Breast cancer Paternal Grandmother   . Colon cancer Maternal Grandfather   .  Colon polyps Father   . Ulcerative colitis Father   . CAD Father     Stent placement at age 38  . Glaucoma Father   . Hypertension Father   . Colitis Father   . Colon polyps Mother   . Osteoporosis Mother   . Thyroid disease Mother     hypo  . Glaucoma Mother   . Diverticulosis Mother   . Sudden death Neg Hx   . Hyperlipidemia Neg Hx   . Heart attack Neg Hx   . Diabetes Neg Hx     Review of Systems:  As stated in the HPI and otherwise negative.   BP 118/78  Pulse 61  Ht 5\' 5"  (1.651 m)  Wt 147 lb 1.9 oz (66.733 kg)  BMI 24.48 kg/m2  SpO2 99%  Physical Examination: General: Well developed, well nourished, NAD HEENT: OP clear, mucus membranes moist SKIN: warm, dry. No rashes. Neuro: No focal deficits Musculoskeletal: Muscle strength 5/5 all ext Psychiatric: Mood and affect normal Neck: No JVD, no carotid bruits, no thyromegaly, no lymphadenopathy. Lungs:Clear bilaterally, no wheezes, rhonci,  crackles Cardiovascular: Regular rate and rhythm. No murmurs, gallops or rubs. Abdomen:Soft. Bowel sounds present. Non-tender.  Extremities: No lower extremity edema. Pulses are 2 + in the bilateral DP/PT.  EKG: NSR, no ischemia. No evidence of prior infarct.   Assessment and Plan:   1. Chest pain: She has atypical chest pain. Most likely related to GERD. Will arrange echo to exclude structural heart disease. Will arrange exercise treadmill stress test to exclude ischemia. Will start Protonix 40 mg po QDaily.

## 2013-02-28 NOTE — Patient Instructions (Addendum)
Your physician recommends that you schedule a follow-up appointment as needed with Dr.McAlhany  Your physician has requested that you have an exercise tolerance test. Can be done with NP or PA.  For further information please visit [email or web address] Please also follow instruction sheet, as given.   Your physician has requested that you have an echocardiogram. Echocardiography is a painless test that uses sound waves to create images of your heart. It provides your doctor with information about the size and shape of your heart and how well your heart's chambers and valves are working. This procedure takes approximately one hour. There are no restrictions for this procedure.   Your physician has recommended you make the following change in your medication:  Start Protonix 40 mg by mouth daily

## 2013-03-04 ENCOUNTER — Ambulatory Visit (HOSPITAL_COMMUNITY)
Admission: RE | Admit: 2013-03-04 | Discharge: 2013-03-04 | Disposition: A | Discharge status: Home / Self Care | Payer: 59 | Source: Ambulatory Visit | Attending: Cardiovascular Disease | Admitting: Cardiovascular Disease

## 2013-03-04 DIAGNOSIS — E785 Hyperlipidemia, unspecified: Secondary | ICD-10-CM | POA: Insufficient documentation

## 2013-03-04 DIAGNOSIS — R072 Precordial pain: Secondary | ICD-10-CM | POA: Insufficient documentation

## 2013-03-04 DIAGNOSIS — R079 Chest pain, unspecified: Secondary | ICD-10-CM

## 2013-03-04 DIAGNOSIS — I079 Rheumatic tricuspid valve disease, unspecified: Secondary | ICD-10-CM | POA: Insufficient documentation

## 2013-03-04 NOTE — Progress Notes (Signed)
[facility]   2D echo completed 03/04/2013.   Cindy Marlyne Totaro, RDCS  

## 2013-03-05 ENCOUNTER — Encounter: Payer: Self-pay | Admitting: Internal Medicine

## 2013-03-05 ENCOUNTER — Ambulatory Visit (INDEPENDENT_AMBULATORY_CARE_PROVIDER_SITE_OTHER): Payer: 59 | Admitting: Internal Medicine

## 2013-03-05 VITALS — BP 110/70 | HR 57 | Temp 97.4°F | Resp 18 | Wt 149.0 lb

## 2013-03-05 DIAGNOSIS — R0789 Other chest pain: Secondary | ICD-10-CM

## 2013-03-05 HISTORY — DX: Other chest pain: R07.89

## 2013-03-05 NOTE — Progress Notes (Signed)
Subjective:    Patient ID: Kara Warren, female    DOB: 06/10/1956, 57 y.o.   MRN: [id number]  HPI  Artelia is here for ER follow up.  She describes episode of L sided chest pain described as pressure no SOB, no radiation, non exertional.  Troponin's neg.  EKG no acute changes.  Maite ran 3 miles earlier today with no reproducible symptoms.  She is on Protonix 40 mg daily now.  She does not really describe heartburn type dyspepsia.    No dyspahagia  No further pain in the last two days  She does note increased stress caring for both parents who are in their mid 80's.   Work has also been stressful for her  No Known Allergies Past Medical History  Diagnosis Date  . Colon polyp   . Menopause   . Migraine   . Endometrial polyp 8/08    benign  . Skene's gland abscess 2009  . Sinusitis, chronic    Past Surgical History  Procedure Laterality Date  . Uterine ablation  2007  . Dilation and curettage of uterus  8/08   History   Social History  . Marital Status: Divorced    Spouse Name: N/A    Number of Children: 0  . Years of Education: N/A   Occupational History  . RN [employer]   Social History Main Topics  . Smoking status: Former Smoker -- 0.10 packs/day for 3 years    Types: Cigarettes    Quit date: 10/02/1978  . Smokeless tobacco: Never Used  . Alcohol Use: 4.2 oz/week    7 Glasses of wine per week     Comment: per week  . Drug Use: No  . Sexually Active: Yes   Other Topics Concern  . Not on file   Social History Narrative  . No narrative on file   Family History  Problem Relation Age of Onset  . Breast cancer Paternal Grandmother   . Colon cancer Maternal Grandfather   . Colon polyps Father   . Ulcerative colitis Father   . CAD Father     Stent placement at age 56  . Glaucoma Father   . Hypertension Father   . Colitis Father   . Colon polyps Mother   . Osteoporosis Mother   . Thyroid disease Mother     hypo  . Glaucoma Mother   . Diverticulosis  Mother   . Sudden death Neg Hx   . Hyperlipidemia Neg Hx   . Heart attack Neg Hx   . Diabetes Neg Hx    Patient Active Problem List   Diagnosis Date Noted  . Atypical chest pain 03/05/2013  . Diverticulosis 07/27/2012  . Family history of osteoporosis 12/28/2011  . Right foot pain 08/09/2011  . Sinusitis, chronic   . Colon polyp   . Endometrial polyp   . Menopause   . Skene's gland abscess   . Migraine   . TRANSAMINASES, SERUM, ELEVATED 12/07/2009  . LUNG NODULE 11/19/2009  . Nonspecific (abnormal) findings on radiological and other examination of body structure 11/19/2009  . NONSPCIFC ABN FINDING RAD & OTH EXAM LUNG FIELD 11/19/2009  . RHINITIS 06/21/2009  . Acute sinusitis, unspecified 05/20/2009  . HIP PAIN, RIGHT 04/12/2009  . NEUTROPENIA UNSPECIFIED 10/07/2008  . HYPERLIPIDEMIA 08/25/2008  . DEPRESSION 08/25/2008   Current Outpatient Prescriptions on File Prior to Visit  Medication Sig Dispense Refill  . BLACK COHOSH EXTRACT PO Take 1 tablet by mouth.      Marland Kitchen  calcium-vitamin D (CALCIUM 500+D) 500-200 MG-UNIT per tablet Take 1 tablet by mouth daily.        Marland Kitchen escitalopram (LEXAPRO) 10 MG tablet Take 0.5 tablets (5 mg total) by mouth daily.  90 tablet  1  . Lysine 500 MG TABS Take 1 tablet by mouth daily.        . Multiple Vitamins-Minerals (MULTI FOR HER 50+) TABS Take 1 tablet by mouth daily.        . pantoprazole (PROTONIX) 40 MG tablet Take 1 tablet (40 mg total) by mouth daily.  30 tablet  11  . Probiotic Product (PROBIOTIC PO) Take 1 capsule by mouth daily. Called ultra spectrum      . Psyllium-Calcium (METAMUCIL PLUS CALCIUM) CAPS Take 1 capsule by mouth daily.        Marland Kitchen loratadine (CLARITIN) 10 MG tablet Take 10 mg by mouth as needed.        No current facility-administered medications on file prior to visit.      Review of Systems See HPI     Objective:   Physical Exam Physical Exam  Nursing note and vitals reviewed.  Constitutional: She is oriented to  person, place, and time. She appears well-developed and well-nourished.  HENT:  Head: Normocephalic and atraumatic.  Cardiovascular: Normal rate and regular rhythm. Exam reveals no gallop and no friction rub.  No murmur heard.  Pulmonary/Chest: Breath sounds normal. She has no wheezes. She has no rales.  Neurological: She is alert and oriented to person, place, and time.  Skin: Skin is warm and dry.  Psychiatric: She has a normal mood and affect. Her behavior is normal.       Assessment & Plan:  Atypical chest pain :   Etiology unclear  ?? Esophageal spasm.   ECHO  Normal   Exercise treadmill pending.   OK to continue Protonix for now.

## 2013-03-05 NOTE — Patient Instructions (Addendum)
See me  In 4 weeks or sooner as needed

## 2013-03-11 ENCOUNTER — Other Ambulatory Visit (HOSPITAL_COMMUNITY): Payer: 59

## 2013-03-11 ENCOUNTER — Ambulatory Visit (HOSPITAL_COMMUNITY)
Admission: RE | Admit: 2013-03-11 | Discharge: 2013-03-11 | Disposition: A | Discharge status: Home / Self Care | Payer: 59 | Source: Ambulatory Visit | Attending: Cardiovascular Disease | Admitting: Cardiovascular Disease

## 2013-03-11 DIAGNOSIS — R079 Chest pain, unspecified: Secondary | ICD-10-CM

## 2013-03-25 ENCOUNTER — Encounter: Payer: 59 | Admitting: Physician Assistant

## 2013-04-02 ENCOUNTER — Ambulatory Visit: Payer: 59 | Admitting: Internal Medicine

## 2013-05-14 ENCOUNTER — Encounter: Payer: Self-pay | Admitting: Internal Medicine

## 2013-05-14 ENCOUNTER — Ambulatory Visit (INDEPENDENT_AMBULATORY_CARE_PROVIDER_SITE_OTHER): Payer: 59 | Admitting: Internal Medicine

## 2013-05-14 VITALS — BP 114/72 | HR 62 | Temp 97.2°F | Resp 18 | Wt 148.0 lb

## 2013-05-14 DIAGNOSIS — E785 Hyperlipidemia, unspecified: Secondary | ICD-10-CM

## 2013-05-14 DIAGNOSIS — Z780 Asymptomatic menopausal state: Secondary | ICD-10-CM

## 2013-05-14 DIAGNOSIS — Z0000 Encounter for general adult medical examination without abnormal findings: Secondary | ICD-10-CM

## 2013-05-14 DIAGNOSIS — K5730 Diverticulosis of large intestine without perforation or abscess without bleeding: Secondary | ICD-10-CM

## 2013-05-14 DIAGNOSIS — K5790 Diverticulosis of intestine, part unspecified, without perforation or abscess without bleeding: Secondary | ICD-10-CM

## 2013-05-14 DIAGNOSIS — N951 Menopausal and female climacteric states: Secondary | ICD-10-CM

## 2013-05-14 DIAGNOSIS — R0789 Other chest pain: Secondary | ICD-10-CM

## 2013-05-14 NOTE — Progress Notes (Signed)
Subjective:    Patient ID: Kara Warren, female    DOB: 12-23-1955, 57 y.o.   MRN: [id number]  HPI Kara Warren is here for her CPE.    She has had no further chest pain or palpitations since last visit.    She had normal exercise stress test - 10 mins  Still stress taking care of aging parents who both have fallen at home.    I do not have labs results that were ordered in May   Migraine  Adequately controlled  She sees Dr. Terri Piedra for her dematologist   No Known Allergies Past Medical History  Diagnosis Date  . Colon polyp   . Menopause   . Migraine   . Endometrial polyp 8/08    benign  . Skene's gland abscess 2009  . Sinusitis, chronic    Past Surgical History  Procedure Laterality Date  . Uterine ablation  2007  . Dilation and curettage of uterus  8/08   History   Social History  . Marital Status: Divorced    Spouse Name: N/A    Number of Children: 0  . Years of Education: N/A   Occupational History  . RN [employer]   Social History Main Topics  . Smoking status: Former Smoker -- 0.10 packs/day for 3 years    Types: Cigarettes    Quit date: 10/02/1978  . Smokeless tobacco: Never Used  . Alcohol Use: 4.2 oz/week    7 Glasses of wine per week     Comment: per week  . Drug Use: No  . Sexual Activity: Yes   Other Topics Concern  . Not on file   Social History Narrative  . No narrative on file   Family History  Problem Relation Age of Onset  . Breast cancer Paternal Grandmother   . Colon cancer Maternal Grandfather   . Colon polyps Father   . Ulcerative colitis Father   . CAD Father     Stent placement at age 64  . Glaucoma Father   . Hypertension Father   . Colitis Father   . Colon polyps Mother   . Osteoporosis Mother   . Thyroid disease Mother     hypo  . Glaucoma Mother   . Diverticulosis Mother   . Sudden death Neg Hx   . Hyperlipidemia Neg Hx   . Heart attack Neg Hx   . Diabetes Neg Hx    Patient Active Problem List   Diagnosis  Date Noted  . Atypical chest pain 03/05/2013  . Diverticulosis 07/27/2012  . Family history of osteoporosis 12/28/2011  . Right foot pain 08/09/2011  . Sinusitis, chronic   . Colon polyp   . Endometrial polyp   . Menopause   . Skene's gland abscess   . Migraine   . TRANSAMINASES, SERUM, ELEVATED 12/07/2009  . LUNG NODULE 11/19/2009  . Nonspecific (abnormal) findings on radiological and other examination of body structure 11/19/2009  . NONSPCIFC ABN FINDING RAD & OTH EXAM LUNG FIELD 11/19/2009  . RHINITIS 06/21/2009  . Acute sinusitis, unspecified 05/20/2009  . HIP PAIN, RIGHT 04/12/2009  . NEUTROPENIA UNSPECIFIED 10/07/2008  . HYPERLIPIDEMIA 08/25/2008  . DEPRESSION 08/25/2008   Current Outpatient Prescriptions on File Prior to Visit  Medication Sig Dispense Refill  . BLACK COHOSH EXTRACT PO Take 1 tablet by mouth.      . calcium-vitamin D (CALCIUM 500+D) 500-200 MG-UNIT per tablet Take 1 tablet by mouth daily.        Marland Kitchen Lysine  500 MG TABS Take 1 tablet by mouth daily.        . Multiple Vitamins-Minerals (MULTI FOR HER 50+) TABS Take 1 tablet by mouth daily.        . pantoprazole (PROTONIX) 40 MG tablet Take 1 tablet (40 mg total) by mouth daily.  30 tablet  11  . Probiotic Product (PROBIOTIC PO) Take 1 capsule by mouth daily. Called ultra spectrum      . Psyllium-Calcium (METAMUCIL PLUS CALCIUM) CAPS Take 1 capsule by mouth daily.        Marland Kitchen loratadine (CLARITIN) 10 MG tablet Take 10 mg by mouth as needed.        No current facility-administered medications on file prior to visit.       Review of Systems  All other systems reviewed and are negative.      Objective:   Physical Exam  Physical Exam  Nursing note and vitals reviewed.  Constitutional: She is oriented to person, place, and time. She appears well-developed and well-nourished.  HENT:  Head: Normocephalic and atraumatic.  Right Ear: Tympanic membrane and ear canal normal. No drainage. Tympanic membrane is not  injected and not erythematous.  Left Ear: Tympanic membrane and ear canal normal. No drainage. Tympanic membrane is not injected and not erythematous.  Nose: Nose normal. Right sinus exhibits no maxillary sinus tenderness and no frontal sinus tenderness. Left sinus exhibits no maxillary sinus tenderness and no frontal sinus tenderness.  Mouth/Throat: Oropharynx is clear and moist. No oral lesions. No oropharyngeal exudate.  Eyes: Conjunctivae and EOM are normal. Pupils are equal, round, and reactive to light.  Neck: Normal range of motion. Neck supple. No JVD present. Carotid bruit is not present. No mass and no thyromegaly present.  Cardiovascular: Normal rate, regular rhythm, S1 normal, S2 normal and intact distal pulses. Exam reveals no gallop and no friction rub.  No murmur heard.  Pulses:  Carotid pulses are 2+ on the right side, and 2+ on the left side.  Dorsalis pedis pulses are 2+ on the right side, and 2+ on the left side.  No carotid bruit. No LE edema  Pulmonary/Chest: Breath sounds normal. She has no wheezes. She has no rales. She exhibits no tenderness.  Abdominal: Soft. Bowel sounds are normal. She exhibits no distension and no mass. There is no hepatosplenomegaly. There is no tenderness. There is no CVA tenderness.  Musculoskeletal: Normal range of motion.  No active synovitis to joints.  Lymphadenopathy:  She has no cervical adenopathy.  She has no axillary adenopathy.  Right: No inguinal and no supraclavicular adenopathy present.  Left: No inguinal and no supraclavicular adenopathy present.  Neurological: She is alert and oriented to person, place, and time. She has normal strength and normal reflexes. She displays no tremor. No cranial nerve deficit or sensory deficit. Coordination and gait normal.  Skin: Skin is warm and dry. No rash noted. No cyanosis. Nails show no clubbing.  Psychiatric: She has a normal mood and affect. Her speech is normal and behavior is normal.  Cognition and memory are normal.          Assessment & Plan:  Health maintenance  Will get labs today  UtD with MM pap next year  Advised influenza vaccine  Atypical chest pain  Work up negative and no further episodes  Colon polyp  Had colonoscopy 07/2012  Diverticulosis  High fiber diet  Hyperlipidemia  Will check today

## 2013-05-14 NOTE — Patient Instructions (Addendum)
See me as needed  Activate my chart

## 2013-05-15 ENCOUNTER — Other Ambulatory Visit: Payer: Self-pay | Admitting: *Deleted

## 2013-05-15 LAB — CBC WITH DIFFERENTIAL/PLATELET
Basophils Absolute: 0 10*3/uL (ref 0.0–0.1)
Basophils Relative: 0 % (ref 0–1)
Eosinophils Absolute: 0.1 10*3/uL (ref 0.0–0.7)
Lymphs Abs: 2.4 10*3/uL (ref 0.7–4.0)
MCH: 31.8 pg (ref 26.0–34.0)
Neutrophils Relative %: 54 % (ref 43–77)
Platelets: 292 10*3/uL (ref 150–400)
RBC: 4.06 MIL/uL (ref 3.87–5.11)
RDW: 13.2 % (ref 11.5–15.5)

## 2013-05-15 LAB — COMPREHENSIVE METABOLIC PANEL
ALT: 22 U/L (ref 0–35)
AST: 26 U/L (ref 0–37)
Alkaline Phosphatase: 53 U/L (ref 39–117)
CO2: 31 mEq/L (ref 19–32)
Creat: 0.97 mg/dL (ref 0.50–1.10)
Sodium: 137 mEq/L (ref 135–145)
Total Bilirubin: 0.5 mg/dL (ref 0.3–1.2)
Total Protein: 7 g/dL (ref 6.0–8.3)

## 2013-05-15 LAB — LIPID PANEL
LDL Cholesterol: 73 mg/dL (ref 0–99)
Total CHOL/HDL Ratio: 2.1 Ratio
VLDL: 27 mg/dL (ref 0–40)

## 2013-05-15 LAB — VITAMIN D 25 HYDROXY (VIT D DEFICIENCY, FRACTURES): Vit D, 25-Hydroxy: 54 ng/mL (ref 30–89)

## 2013-05-15 LAB — TSH: TSH: 1.197 u[IU]/mL (ref 0.350–4.500)

## 2013-05-15 NOTE — Telephone Encounter (Signed)
error 

## 2013-05-16 ENCOUNTER — Encounter: Payer: Self-pay | Admitting: *Deleted

## 2013-05-19 ENCOUNTER — Encounter: Payer: Self-pay | Admitting: *Deleted

## 2013-05-19 ENCOUNTER — Telehealth: Payer: Self-pay | Admitting: *Deleted

## 2013-05-19 NOTE — Telephone Encounter (Signed)
Kara Warren called wanting lab results.  She said they are showing up on my chart.

## 2013-05-19 NOTE — Telephone Encounter (Signed)
Message copied by Mathews Robinsons on Mon May 19, 2013  1:09 PM ------      Message from: Raechel Chute D      Created: Thu May 15, 2013  4:17 PM       Ok to mail to pt ------

## 2013-05-19 NOTE — Telephone Encounter (Signed)
LVM that labs were normal also mailed a copy yo home address

## 2013-06-11 ENCOUNTER — Other Ambulatory Visit: Payer: Self-pay | Admitting: Dermatology

## 2013-07-29 ENCOUNTER — Ambulatory Visit (INDEPENDENT_AMBULATORY_CARE_PROVIDER_SITE_OTHER): Payer: 59 | Admitting: Family Medicine

## 2013-07-29 ENCOUNTER — Encounter: Payer: Self-pay | Admitting: Family Medicine

## 2013-07-29 VITALS — BP 118/77 | HR 62 | Ht 65.0 in

## 2013-07-29 DIAGNOSIS — M25552 Pain in left hip: Secondary | ICD-10-CM

## 2013-07-29 DIAGNOSIS — M25532 Pain in left wrist: Secondary | ICD-10-CM

## 2013-07-29 DIAGNOSIS — M25559 Pain in unspecified hip: Secondary | ICD-10-CM

## 2013-07-29 DIAGNOSIS — M25539 Pain in unspecified wrist: Secondary | ICD-10-CM

## 2013-07-29 NOTE — Patient Instructions (Signed)
Start aleve 2 tabs twice a day with food regularly for next 7-10 days. Wear wrist brace as often as possible including when sleeping. Icing 15 minutes at a time 3-4 times a day. If over the next 1-2 weeks pain is getting worse, come back and see me.  Your left hip pain is due to a combination of issues - glut medius weakness, hip flexor and sartorius strain with mild bursitis. Start strengthening exercises (hip side raises, hack sack, standing hip rotations) 3 sets of 10 once a day - use ankle weight (2 or 5 pounds). Icing as noted above. Activities as tolerated.

## 2013-07-30 ENCOUNTER — Encounter: Payer: Self-pay | Admitting: Family Medicine

## 2013-07-30 DIAGNOSIS — M25552 Pain in left hip: Secondary | ICD-10-CM | POA: Insufficient documentation

## 2013-07-30 DIAGNOSIS — M25532 Pain in left wrist: Secondary | ICD-10-CM | POA: Insufficient documentation

## 2013-07-30 HISTORY — DX: Pain in left wrist: M25.532

## 2013-07-30 HISTORY — DX: Pain in left hip: M25.552

## 2013-07-30 NOTE — Assessment & Plan Note (Signed)
consistent with wrist sprain, mild dequervains tenosynovitis.  Icing, nsaids, thumb spica brace.

## 2013-07-30 NOTE — Progress Notes (Signed)
Patient ID: Kara Warren, female   DOB: 1955/11/25, 57 y.o.   MRN: [id number]  PCP: Levon Hedger, MD  Subjective:   HPI: Patient is a 57 y.o. female here for left wrist pain.  Patient reports on 10/25 she was doing pilates which included wrist extension. Does not recall an injury or trauma. Next morning this started bothering her dorsal wrist. Then this morning noticed swelling. Pain more on radial side of wrist now.  Also having pain in left anterior hip from a class - felt like she overused this.  Past Medical History  Diagnosis Date  . Colon polyp   . Menopause   . Migraine   . Endometrial polyp 8/08    benign  . Skene's gland abscess 2009  . Sinusitis, chronic     Current Outpatient Prescriptions on File Prior to Visit  Medication Sig Dispense Refill  . BLACK COHOSH EXTRACT PO Take 1 tablet by mouth.      . calcium-vitamin D (CALCIUM 500+D) 500-200 MG-UNIT per tablet Take 1 tablet by mouth daily.        Marland Kitchen escitalopram (LEXAPRO) 10 MG tablet Take 10 mg by mouth daily. 20 mg tablet 1/2 daily      . loratadine (CLARITIN) 10 MG tablet Take 10 mg by mouth as needed.       Marland Kitchen Lysine 500 MG TABS Take 1 tablet by mouth daily.        . Multiple Vitamins-Minerals (MULTI FOR HER 50+) TABS Take 1 tablet by mouth daily.        . pantoprazole (PROTONIX) 40 MG tablet Take 1 tablet (40 mg total) by mouth daily.  30 tablet  11  . Probiotic Product (PROBIOTIC PO) Take 1 capsule by mouth daily. Called ultra spectrum      . Psyllium-Calcium (METAMUCIL PLUS CALCIUM) CAPS Take 1 capsule by mouth daily.         No current facility-administered medications on file prior to visit.    Past Surgical History  Procedure Laterality Date  . Uterine ablation  2007  . Dilation and curettage of uterus  8/08    No Known Allergies  History   Social History  . Marital Status: Divorced    Spouse Name: N/A    Number of Children: 0  . Years of Education: N/A   Occupational History  .  RN [employer]   Social History Main Topics  . Smoking status: Former Smoker -- 0.10 packs/day for 3 years    Types: Cigarettes    Quit date: 10/02/1978  . Smokeless tobacco: Never Used  . Alcohol Use: 4.2 oz/week    7 Glasses of wine per week     Comment: per week  . Drug Use: No  . Sexual Activity: Yes   Other Topics Concern  . Not on file   Social History Narrative  . No narrative on file    Family History  Problem Relation Age of Onset  . Breast cancer Paternal Grandmother   . Colon cancer Maternal Grandfather   . Colon polyps Father   . Ulcerative colitis Father   . CAD Father     Stent placement at age 45  . Glaucoma Father   . Hypertension Father   . Colitis Father   . Colon polyps Mother   . Osteoporosis Mother   . Thyroid disease Mother     hypo  . Glaucoma Mother   . Diverticulosis Mother   . Sudden death Neg Hx   .  Hyperlipidemia Neg Hx   . Heart attack Neg Hx   . Diabetes Neg Hx     BP 118/77  Pulse 62  Ht 5\' 5"  (1.651 m)  Review of Systems: See HPI above.    Objective:  Physical Exam:  Gen: NAD  Left wrist: Mild swelling dorsal wrist.  No other deformity, bruising. TTP mildly dorsal wrist joint, over 1st dorsal compartment.  No other wrist/hand TTP including snuffbox. FROM wrist and digits - pain with extension. Strength 5/5 with finger abduction, extension, thumb opposition. Negative finkelsteins. Negative tinels. NVI distally.  Left hip/back: No gross deformity, scoliosis. TTP within hip flexor, just inferior to sartorius, and posterior to greater trochanter, minimal trochanter. No midline or bony TTP. FROM. Strength 5-/5 hip abduction.  5/5 other muscle groups.  Pain with sartorius testing. 2+ MSRs in patellar and achilles tendons, equal bilaterally. Negative SLRs. Sensation intact to light touch bilaterally. Negative logroll bilateral hips Negative fabers and piriformis stretches.    Assessment & Plan:  1. Left wrist pain -  consistent with wrist sprain, mild dequervains tenosynovitis.  Icing, nsaids, thumb spica brace.  2. Left hip pain - combination of glut medius weakness, hip flexor and sartorius strain.  Start home exercise program, icing, nsaids.  Activities as tolerated.  Consider formal PT.

## 2013-07-30 NOTE — Assessment & Plan Note (Signed)
combination of glut medius weakness, hip flexor and sartorius strain.  Start home exercise program, icing, nsaids.  Activities as tolerated.  Consider formal PT.

## 2013-08-05 ENCOUNTER — Other Ambulatory Visit: Payer: Self-pay | Admitting: Internal Medicine

## 2013-08-05 NOTE — Telephone Encounter (Signed)
Refill request

## 2013-08-07 ENCOUNTER — Other Ambulatory Visit: Payer: Self-pay

## 2013-11-03 ENCOUNTER — Encounter: Payer: Self-pay | Admitting: Emergency Medicine

## 2013-11-03 ENCOUNTER — Emergency Department
Admission: EM | Admit: 2013-11-03 | Discharge: 2013-11-03 | Disposition: A | Discharge status: Home / Self Care | Payer: 59 | Source: Home / Self Care | Attending: Family Medicine | Admitting: Family Medicine

## 2013-11-03 DIAGNOSIS — L858 Other specified epidermal thickening: Secondary | ICD-10-CM

## 2013-11-03 DIAGNOSIS — Q828 Other specified congenital malformations of skin: Secondary | ICD-10-CM

## 2013-11-03 MED ORDER — PREDNISONE 20 MG PO TABS: 20.0000 mg | ORAL_TABLET | Freq: Two times a day (BID) | ORAL | Status: DC

## 2013-11-03 MED ORDER — MUPIROCIN 2 % EX OINT: 1.0000 "application " | TOPICAL_OINTMENT | Freq: Three times a day (TID) | CUTANEOUS | Status: DC

## 2013-11-03 NOTE — ED Provider Notes (Signed)
CSN: [id number]     Arrival date & time 11/03/13  1137 History   First MD Initiated Contact with Patient 11/03/13 1201     Chief Complaint  Patient presents with  . Rash      HPI Comments: About two weeks ago patient developed a slightly pruritic rash on her upper arms and shoulders that subsequently appeared on her legs and back.  She notes that her skin has a rough texture.  She has one lesion on her back that is mildly tender to palpation.  She feels well otherwise.  Patient is a 58 y.o. female presenting with rash. The history is provided by the patient.  Rash Pain location: arms, legs, and torso. Pain quality comment:  Itching Pain severity:  Mild Onset quality:  Gradual Duration:  2 weeks Timing:  Constant Progression:  Worsening Chronicity:  New Context comment:  No known cause Relieved by:  Nothing Worsened by:  Nothing tried Ineffective treatments: moisturizing lotions. Associated symptoms: no anorexia, no chills, no cough, no fatigue, no fever, no nausea and no sore throat     Past Medical History  Diagnosis Date  . Colon polyp   . Menopause   . Migraine   . Endometrial polyp 8/08    benign  . Skene's gland abscess 2009  . Sinusitis, chronic    Past Surgical History  Procedure Laterality Date  . Uterine ablation  2007  . Dilation and curettage of uterus  8/08   Family History  Problem Relation Age of Onset  . Breast cancer Paternal Grandmother   . Colon cancer Maternal Grandfather   . Colon polyps Father   . Ulcerative colitis Father   . CAD Father     Stent placement at age 68  . Glaucoma Father   . Hypertension Father   . Colitis Father   . Colon polyps Mother   . Osteoporosis Mother   . Thyroid disease Mother     hypo  . Glaucoma Mother   . Diverticulosis Mother   . Sudden death Neg Hx   . Hyperlipidemia Neg Hx   . Heart attack Neg Hx   . Diabetes Neg Hx    History  Substance Use Topics  . Smoking status: Former Smoker -- 0.10 packs/day for  3 years    Types: Cigarettes    Quit date: 10/02/1978  . Smokeless tobacco: Never Used  . Alcohol Use: 4.2 oz/week    7 Glasses of wine per week     Comment: per week   OB History   Grav Para Term Preterm Abortions TAB SAB Ect Mult Living                 Review of Systems  Constitutional: Negative for fever, chills and fatigue.  HENT: Negative for sore throat.   Respiratory: Negative for cough.   Gastrointestinal: Negative for nausea and anorexia.  Skin: Positive for rash.  All other systems reviewed and are negative.    Allergies  Review of patient's allergies indicates no known allergies.  Home Medications   Current Outpatient Rx  Name  Route  Sig  Dispense  Refill  . BLACK COHOSH EXTRACT PO   Oral   Take 1 tablet by mouth.         . calcium-vitamin D (CALCIUM 500+D) 500-200 MG-UNIT per tablet   Oral   Take 1 tablet by mouth daily.           . citalopram (CELEXA) 20 MG tablet  TAKE 1 TABLET BY MOUTH ONCE DAILY   90 tablet   3   . loratadine (CLARITIN) 10 MG tablet   Oral   Take 10 mg by mouth as needed.          Marland Kitchen Lysine 500 MG TABS   Oral   Take 1 tablet by mouth daily.           . Multiple Vitamins-Minerals (MULTI FOR HER 50+) TABS   Oral   Take 1 tablet by mouth daily.           . mupirocin ointment (BACTROBAN) 2 %   Topical   Apply 1 application topically 3 (three) times daily.   22 g   0   . pantoprazole (PROTONIX) 40 MG tablet   Oral   Take 1 tablet (40 mg total) by mouth daily.   30 tablet   11   . predniSONE (DELTASONE) 20 MG tablet   Oral   Take 1 tablet (20 mg total) by mouth 2 (two) times daily. Take with food.   10 tablet   0   . Probiotic Product (PROBIOTIC PO)   Oral   Take 1 capsule by mouth daily. Called ultra spectrum         . Psyllium-Calcium (METAMUCIL PLUS CALCIUM) CAPS   Oral   Take 1 capsule by mouth daily.            BP 130/81  Pulse 63  Temp(Src) 98 F (36.7 C) (Oral)  Resp 14  Wt 146  lb (66.225 kg)  SpO2 100% Physical Exam  Constitutional: She is oriented to person, place, and time. She appears well-developed and well-nourished. No distress.  HENT:  Head: Normocephalic.  Mouth/Throat: Oropharynx is clear and moist.  Eyes: Conjunctivae and EOM are normal. Pupils are equal, round, and reactive to light. Right eye exhibits no discharge. Left eye exhibits no discharge.  Neck: Neck supple.  Cardiovascular: Normal heart sounds.   Pulmonary/Chest: Breath sounds normal.  Abdominal: Soft. There is no tenderness.  Musculoskeletal: She exhibits no edema.  Lymphadenopathy:    She has no cervical adenopathy.  Neurological: She is alert and oriented to person, place, and time.  Skin: Skin is warm and dry. There is erythema.     Patient's upper arms, legs, and torso have hyperkeratotic follicles with surrounding erythema.  No tenderness to palpation.  Skin has a "sandpaper texture."  On her left midback is a 57mm dia follicle that is raised, with central eschar, and more erythematous than other areas.      ED Course  Procedures  none       MDM   1. Keratosis pilaris    Begin prednisone burst to minimize existing inflammation.  Begin Bactroban ointment on small lesion mid back for 4 to 5 days. Minimize baths/showers.  Use a mild bath soap containing oil such as unscented Dove.  Apply a moisturizing cream or lotion immediately after bathing while still wet, then towel dry (use a cream containing urea or lactic acid). Given a [employer] and treatment sheet on topic Followup with dermatologist if not improving 2 weeks.    Kandra Nicolas, MD 11/03/13 [id number]

## 2013-11-03 NOTE — Discharge Instructions (Signed)
Minimize baths/showers.  Use a mild bath soap containing oil such as unscented Dove.  Apply a moisturizing cream or lotion immediately after bathing while still wet, then towel dry (use a cream containing urea or lactic acid).

## 2013-11-03 NOTE — ED Notes (Signed)
Kara Warren c/o rash that started on both arms and has spread to legs and back. Small red, raised bumps that itch. Denies new medication, soaps, detergent, etc.

## 2014-01-07 ENCOUNTER — Other Ambulatory Visit: Payer: Self-pay | Admitting: Internal Medicine

## 2014-01-07 DIAGNOSIS — Z1231 Encounter for screening mammogram for malignant neoplasm of breast: Secondary | ICD-10-CM

## 2014-02-25 ENCOUNTER — Ambulatory Visit (HOSPITAL_BASED_OUTPATIENT_CLINIC_OR_DEPARTMENT_OTHER)
Admission: RE | Admit: 2014-02-25 | Discharge: 2014-02-25 | Disposition: A | Discharge status: Home / Self Care | Payer: 59 | Source: Ambulatory Visit | Attending: Internal Medicine | Admitting: Internal Medicine

## 2014-02-25 DIAGNOSIS — Z1231 Encounter for screening mammogram for malignant neoplasm of breast: Secondary | ICD-10-CM | POA: Insufficient documentation

## 2014-03-05 ENCOUNTER — Emergency Department
Admission: EM | Admit: 2014-03-05 | Discharge: 2014-03-05 | Disposition: A | Discharge status: Home / Self Care | Payer: 59 | Source: Home / Self Care | Attending: Emergency Medicine | Admitting: Emergency Medicine

## 2014-03-05 ENCOUNTER — Encounter: Payer: Self-pay | Admitting: Emergency Medicine

## 2014-03-05 DIAGNOSIS — J069 Acute upper respiratory infection, unspecified: Secondary | ICD-10-CM

## 2014-03-05 DIAGNOSIS — M79671 Pain in right foot: Secondary | ICD-10-CM

## 2014-03-05 DIAGNOSIS — M79609 Pain in unspecified limb: Secondary | ICD-10-CM

## 2014-03-05 MED ORDER — PREDNISONE (PAK) 10 MG PO TABS: ORAL_TABLET | Freq: Every day | ORAL | Status: DC

## 2014-03-05 NOTE — ED Provider Notes (Signed)
CSN: [id number]     Arrival date & time 03/05/14  1124 History   First MD Initiated Contact with Patient 03/05/14 1131     Chief Complaint  Patient presents with  . Facial Pain  . Foot Pain   (Consider location/radiation/quality/duration/timing/severity/associated sxs/prior Treatment) HPI Kara Warren is a 58 y.o. female who complains of onset of cold symptoms for a week.  The symptoms are constant and mild-moderate in severity. Taking Claritin which helps a little.  Occasionally gets sinus issues. No sore throat + non-productive cough No pleuritic pain No wheezing + nasal congestion + post-nasal drainage + sinus pain/pressure No chest congestion No itchy/red eyes + earache No hemoptysis No SOB No chills/sweats No fever No nausea No vomiting No abdominal pain No diarrhea No skin rashes No fatigue No myalgias + headache    She also complains of right foot pain for last month.  She runs about 3 miles a day and walks mother 2.  She has noticed it on the outside of her right foot.  She normally wears orthotics but it has been a long time since she tendon repair.  She did get a new pair of tennis shoes about 2 weeks ago but the pain preceded this.  No bruising or swelling.  Hurts while moving and with pressing on the area.  As far as she knows, there is no changes to her regimen and she normally runs on sidewalk and streets.  Past Medical History  Diagnosis Date  . Colon polyp   . Menopause   . Migraine   . Endometrial polyp 8/08    benign  . Skene's gland abscess 2009  . Sinusitis, chronic    Past Surgical History  Procedure Laterality Date  . Uterine ablation  2007  . Dilation and curettage of uterus  8/08   Family History  Problem Relation Age of Onset  . Breast cancer Paternal Grandmother   . Colon cancer Maternal Grandfather   . Colon polyps Father   . Ulcerative colitis Father   . CAD Father     Stent placement at age 51  . Glaucoma Father   . Hypertension Father    . Colitis Father   . Colon polyps Mother   . Osteoporosis Mother   . Thyroid disease Mother     hypo  . Glaucoma Mother   . Diverticulosis Mother   . Sudden death Neg Hx   . Hyperlipidemia Neg Hx   . Heart attack Neg Hx   . Diabetes Neg Hx    History  Substance Use Topics  . Smoking status: Former Smoker -- 0.10 packs/day for 3 years    Types: Cigarettes    Quit date: 10/02/1978  . Smokeless tobacco: Never Used  . Alcohol Use: 4.2 oz/week    7 Glasses of wine per week     Comment: per week   OB History   Grav Para Term Preterm Abortions TAB SAB Ect Mult Living                 Review of Systems  All other systems reviewed and are negative.   Allergies  Review of patient's allergies indicates no known allergies.  Home Medications   Prior to Admission medications   Medication Sig Start Date End Date Taking? Authorizing Provider  BLACK COHOSH EXTRACT PO Take 1 tablet by mouth.    Historical Provider, MD  calcium-vitamin D (CALCIUM 500+D) 500-200 MG-UNIT per tablet Take 1 tablet by mouth daily.  Historical Provider, MD  citalopram (CELEXA) 20 MG tablet TAKE 1 TABLET BY MOUTH ONCE DAILY 08/05/13   Lanice Shirts, MD  loratadine (CLARITIN) 10 MG tablet Take 10 mg by mouth as needed.     Historical Provider, MD  Lysine 500 MG TABS Take 1 tablet by mouth daily.      Historical Provider, MD  Multiple Vitamins-Minerals (MULTI FOR HER 50+) TABS Take 1 tablet by mouth daily.      Historical Provider, MD  mupirocin ointment (BACTROBAN) 2 % Apply 1 application topically 3 (three) times daily. 11/03/13   Kandra Nicolas, MD  pantoprazole (PROTONIX) 40 MG tablet Take 1 tablet (40 mg total) by mouth daily. 02/28/13   Burnell Blanks, MD  predniSONE (DELTASONE) 20 MG tablet Take 1 tablet (20 mg total) by mouth 2 (two) times daily. Take with food. 11/03/13   Kandra Nicolas, MD  predniSONE (STERAPRED UNI-PAK) 10 MG tablet Take by mouth daily. 6 day pack, use as directed 03/05/14    Janeann Forehand, MD  Probiotic Product (PROBIOTIC PO) Take 1 capsule by mouth daily. Called ultra spectrum    Historical Provider, MD  Psyllium-Calcium (METAMUCIL PLUS CALCIUM) CAPS Take 1 capsule by mouth daily.      Historical Provider, MD   BP 128/75  Pulse 59  Temp(Src) 98.2 F (36.8 C) (Oral)  Ht 5\' 5"  (1.651 m)  Wt 145 lb 8 oz (65.998 kg)  BMI 24.21 kg/m2  SpO2 99% Physical Exam  Nursing note and vitals reviewed. Constitutional: She is oriented to person, place, and time. She appears well-developed and well-nourished.  HENT:  Head: Normocephalic and atraumatic.  Right Ear: Tympanic membrane, external ear and ear canal normal.  Left Ear: Tympanic membrane, external ear and ear canal normal.  Nose: Mucosal edema and rhinorrhea present.  Mouth/Throat: Posterior oropharyngeal erythema present. No oropharyngeal exudate or posterior oropharyngeal edema.  Eyes: No scleral icterus.  Neck: Neck supple.  Cardiovascular: Regular rhythm and normal heart sounds.   Pulmonary/Chest: Effort normal and breath sounds normal. No respiratory distress.  Musculoskeletal:  Right foot examination demonstrates tenderness proximal to the base of the fifth metatarsal along the peroneal brevis tendon, not on the peroneal longus.  She is no tenderness at the medial or lateral malleolus, navicular, proximal fibula, or with metatarsal squeeze.  No bruising or effusion.  Normal gait, distal neurovascular status intact.  Neurological: She is alert and oriented to person, place, and time.  Skin: Skin is warm and dry.  Psychiatric: She has a normal mood and affect. Her speech is normal.    ED Course  Procedures (including critical care time) Labs Review Labs Reviewed - No data to display  Imaging Review No results found.   MDM   1. Acute upper respiratory infections of unspecified site   2. Right foot pain    1)  Likely viral.  Will Rx Prednisone first.  If not improving or if difficulty more  symptoms such as fever, will call in a prescription for an antibiotic at that time.  Use nasal saline solution (over the counter) at least 3 times a day.  Use over the counter decongestants like Zyrtec-D every 12 hours as needed to help with congestion.  If you have hypertension, do not take medicines with sudafed.  Can take tylenol every 6 hours or motrin every 8 hours for pain or fever. Follow up with your primary doctor if no improvement in 5-7 days, sooner if increasing pain, fever, or  new symptoms.     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  No x-ray was taken today as there is no tenderness at base of fifth metatarsal.  I am going to refer her to her sports medicine physician and she is going to call and make an appointment today for that.  She likely needs more orthotics or a foot evaluation.  She can continue to run as tolerated.  The prednisone will likely help temporarily for this condition.   Janeann Forehand, MD 03/05/14 1226

## 2014-03-05 NOTE — ED Notes (Addendum)
For about a week c/o headache, stuffy nose, feels ears draining, non productive cough. Has taken 1 Claritin a day.  Also has pain in lateral and dorsal surface of R. Foot.  Is a runner and is concerned re potential injury.

## 2014-03-12 ENCOUNTER — Encounter: Payer: Self-pay | Admitting: Sports Medicine

## 2014-03-12 ENCOUNTER — Ambulatory Visit (INDEPENDENT_AMBULATORY_CARE_PROVIDER_SITE_OTHER): Payer: 59 | Admitting: Sports Medicine

## 2014-03-12 VITALS — BP 113/63 | HR 61 | Ht 65.0 in | Wt 143.0 lb

## 2014-03-12 DIAGNOSIS — M7750 Other enthesopathy of unspecified foot: Secondary | ICD-10-CM

## 2014-03-12 DIAGNOSIS — M7671 Peroneal tendinitis, right leg: Secondary | ICD-10-CM | POA: Insufficient documentation

## 2014-03-12 HISTORY — DX: Peroneal tendinitis, right leg: M76.71

## 2014-03-12 MED ORDER — MELOXICAM 15 MG PO TABS: ORAL_TABLET | ORAL | Status: DC

## 2014-03-12 NOTE — Assessment & Plan Note (Signed)
Custom orthotics, Mobic, Home rehabilitation exercises. Return in 3 weeks, consider ultrasound guided injection if no better

## 2014-03-12 NOTE — Progress Notes (Signed)
   Subjective:    I'm seeing this patient as a consultation for:  Dr. Coralyn Mark and Dr. Koleen Nimrod.  CC: Right foot pain  HPI: This is a very pleasant and active 58 year-old female, she runs, rides a bike, and works out. For the past several months she's had pain that she localizes on the lateral aspect of her right foot, along the course of the peroneal tendons. Pain is moderate, persistent. No radiation, no trauma. She does desire cost orthotics. She has a pair but they are very old. She is not taking any over-the-counter NSAIDs  Past medical history, Surgical history, Family history not pertinant except as noted below, Social history, Allergies, and medications have been entered into the medical record, reviewed, and no changes needed.   Review of Systems: No headache, visual changes, nausea, vomiting, diarrhea, constipation, dizziness, abdominal pain, skin rash, fevers, chills, night sweats, weight loss, swollen lymph nodes, body aches, joint swelling, muscle aches, chest pain, shortness of breath, mood changes, visual or auditory hallucinations.   Objective:   General: Well Developed, well nourished, and in no acute distress.  Neuro/Psych: Alert and oriented x3, extra-ocular muscles intact, able to move all 4 extremities, sensation grossly intact. Skin: Warm and dry, no rashes noted.  Respiratory: Not using accessory muscles, speaking in full sentences, trachea midline.  Cardiovascular: Pulses palpable, no extremity edema. Abdomen: Does not appear distended. Right Ankle: No visible erythema or swelling. Range of motion is full in all directions. Strength is 5/5 in all directions. Stable lateral and medial ligaments; squeeze test and kleiger test unremarkable; Talar dome nontender; No pain at base of 5th MT; No tenderness over cuboid; No tenderness over N spot or navicular prominence Tender to palpation along the peroneal tendons, distally, reproduction of pain with resisted  eversion. Negative tarsal tunnel tinel's Able to walk 4 steps.  Patient was fitted for a : standard, cushioned, semi-rigid orthotic. The orthotic was heated and afterward the patient stood on the orthotic blank positioned on the orthotic stand. The patient was positioned in subtalar neutral position and 10 degrees of ankle dorsiflexion in a weight bearing stance. After completion of molding, a stable base was applied to the orthotic blank. The blank was ground to a stable position for weight bearing. Size: 8 Base: Blue EVA Additional Posting and Padding: None The patient ambulated these, and they were very comfortable.  Impression and Recommendations:   This case required medical decision making of moderate complexity.

## 2014-03-15 NOTE — Progress Notes (Signed)
Subjective:    Patient ID: Kara Warren, female    DOB: [birth year], 58 y.o.   MRN: [id number]  HPI  Poet is here for acute visit.  She described that for the past 5-6 months she will have episodes of watery "explosive " diarrhea approx 2 times per week.   Usually will occur in am after she goes running.    Does not awaken at night.  Some nausea   But no persistant abd pain in between episodes.  Only cramps prior to diarrhea.  No bloody or dark stools  She drinks almond mild and only has cow milk that she uses in her coffee.    She is on UltraFlora probiotic .    No rash no palpitations  No elevated BP  She does note she is under stress trying to find assisted living for aging parents who are close to age 49.      No Known Allergies Past Medical History  Diagnosis Date  . Colon polyp   . Menopause   . Migraine   . Endometrial polyp 8/08    benign  . Skene's gland abscess 2009  . Sinusitis, chronic    Past Surgical History  Procedure Laterality Date  . Uterine ablation  2007  . Dilation and curettage of uterus  8/08   History   Social History  . Marital Status: Divorced    Spouse Name: N/A    Number of Children: 0  . Years of Education: N/A   Occupational History  . RN [employer]   Social History Main Topics  . Smoking status: Former Smoker -- 0.10 packs/day for 3 years    Types: Cigarettes    Quit date: 10/02/1978  . Smokeless tobacco: Never Used  . Alcohol Use: 4.2 oz/week    7 Glasses of wine per week     Comment: per week  . Drug Use: No  . Sexual Activity: Yes   Other Topics Concern  . Not on file   Social History Narrative  . No narrative on file   Family History  Problem Relation Age of Onset  . Breast cancer Paternal Grandmother   . Colon cancer Maternal Grandfather   . Colon polyps Father   . Ulcerative colitis Father   . CAD Father     Stent placement at age 62  . Glaucoma Father   . Hypertension Father   . Colitis Father   .  Colon polyps Mother   . Osteoporosis Mother   . Thyroid disease Mother     hypo  . Glaucoma Mother   . Diverticulosis Mother   . Sudden death Neg Hx   . Hyperlipidemia Neg Hx   . Heart attack Neg Hx   . Diabetes Neg Hx    Patient Active Problem List   Diagnosis Date Noted  . Peroneal tendinitis of right lower extremity 03/12/2014  . Left wrist pain 07/30/2013  . Left hip pain 07/30/2013  . Atypical chest pain 03/05/2013  . Diverticulosis 07/27/2012  . Family history of osteoporosis 12/28/2011  . Right foot pain 08/09/2011  . Sinusitis, chronic   . Colon polyp   . Endometrial polyp   . Menopause   . Skene's gland abscess   . Migraine   . TRANSAMINASES, SERUM, ELEVATED 12/07/2009  . LUNG NODULE 11/19/2009  . Nonspecific (abnormal) findings on radiological and other examination of body structure 11/19/2009  . [place] LUNG FIELD 11/19/2009  .  RHINITIS 06/21/2009  . Acute sinusitis, unspecified 05/20/2009  . HIP PAIN, RIGHT 04/12/2009  . NEUTROPENIA UNSPECIFIED 10/07/2008  . HYPERLIPIDEMIA 08/25/2008  . DEPRESSION 08/25/2008   Current Outpatient Prescriptions on File Prior to Visit  Medication Sig Dispense Refill  . BLACK COHOSH EXTRACT PO Take 1 tablet by mouth.      . calcium-vitamin D (CALCIUM 500+D) 500-200 MG-UNIT per tablet Take 1 tablet by mouth daily.        . citalopram (CELEXA) 20 MG tablet TAKE 1 TABLET BY MOUTH ONCE DAILY  90 tablet  3  . loratadine (CLARITIN) 10 MG tablet Take 10 mg by mouth as needed.       Marland Kitchen Lysine 500 MG TABS Take 1 tablet by mouth daily.        . meloxicam (MOBIC) 15 MG tablet One tab PO qAM with breakfast for 2 weeks, then daily prn pain.  30 tablet  3  . Multiple Vitamins-Minerals (MULTI FOR HER 50+) TABS Take 1 tablet by mouth daily.        . pantoprazole (PROTONIX) 40 MG tablet Take 1 tablet (40 mg total) by mouth daily.  30 tablet  11  . predniSONE (STERAPRED UNI-PAK) 10 MG tablet Take by mouth daily. 6 day  pack, use as directed  21 tablet  0  . Probiotic Product (PROBIOTIC PO) Take 1 capsule by mouth daily. Called ultra spectrum      . Psyllium-Calcium (METAMUCIL PLUS CALCIUM) CAPS Take 1 capsule by mouth daily.         No current facility-administered medications on file prior to visit.      Review of Systems See HPI    Objective:   Physical Exam  Physical Exam  Nursing note and vitals reviewed.  Constitutional: She is oriented to person, place, and time. She appears well-developed and well-nourished.  HENT:  Head: Normocephalic and atraumatic.  Cardiovascular: Normal rate and regular rhythm. Exam reveals no gallop and no friction rub.  No murmur heard.  Pulmonary/Chest: Breath sounds normal. She has no wheezes. She has no rales. Abd:  Soft BS +  Non tender all quaddrants  No rebound  No peritoneal signs Neurological: She is alert and oriented to person, place, and time.  Skin: Skin is warm and dry.  Psychiatric: She has a normal mood and affect. Her behavior is normal.          Assessment & Plan:  Diarrhea  Etiology unclear .  Does not happen daily  ?IBS,,  Doubt lactose intolerance as she does not drink cows milk.    Will check Thyroid and celiac panel.  Advised to make appt with her GI Dr. Collene Mares  Situational stress    Will get all labs today

## 2014-03-16 ENCOUNTER — Other Ambulatory Visit: Payer: Self-pay | Admitting: Cardiovascular Disease

## 2014-03-16 ENCOUNTER — Other Ambulatory Visit: Payer: Self-pay | Admitting: Internal Medicine

## 2014-03-16 ENCOUNTER — Encounter: Payer: Self-pay | Admitting: Internal Medicine

## 2014-03-16 ENCOUNTER — Ambulatory Visit (INDEPENDENT_AMBULATORY_CARE_PROVIDER_SITE_OTHER): Payer: 59 | Admitting: Internal Medicine

## 2014-03-16 VITALS — BP 122/78 | HR 67 | Resp 16 | Ht 65.0 in | Wt 144.0 lb

## 2014-03-16 DIAGNOSIS — R197 Diarrhea, unspecified: Secondary | ICD-10-CM

## 2014-03-16 LAB — CBC WITH DIFFERENTIAL/PLATELET
BASOS ABS: 0 10*3/uL (ref 0.0–0.1)
BASOS PCT: 0 % (ref 0–1)
Eosinophils Absolute: 0.1 10*3/uL (ref 0.0–0.7)
Eosinophils Relative: 1 % (ref 0–5)
HEMATOCRIT: 39.5 % (ref 36.0–46.0)
Hemoglobin: 13.8 g/dL (ref 12.0–15.0)
LYMPHS PCT: 24 % (ref 12–46)
Lymphs Abs: 1.4 10*3/uL (ref 0.7–4.0)
MCH: 32.6 pg (ref 26.0–34.0)
MCHC: 34.9 g/dL (ref 30.0–36.0)
MCV: 93.4 fL (ref 78.0–100.0)
Monocytes Absolute: 0.4 10*3/uL (ref 0.1–1.0)
Monocytes Relative: 7 % (ref 3–12)
NEUTROS ABS: 4.1 10*3/uL (ref 1.7–7.7)
Neutrophils Relative %: 68 % (ref 43–77)
PLATELETS: 314 10*3/uL (ref 150–400)
RBC: 4.23 MIL/uL (ref 3.87–5.11)
RDW: 13.2 % (ref 11.5–15.5)
WBC: 6 10*3/uL (ref 4.0–10.5)

## 2014-03-16 LAB — T3, FREE: T3, Free: 2.2 pg/mL — ABNORMAL LOW (ref 2.3–4.2)

## 2014-03-16 LAB — COMPREHENSIVE METABOLIC PANEL
ALK PHOS: 49 U/L (ref 39–117)
ALT: 27 U/L (ref 0–35)
AST: 34 U/L (ref 0–37)
Albumin: 5.1 g/dL (ref 3.5–5.2)
BUN: 18 mg/dL (ref 6–23)
CHLORIDE: 96 meq/L (ref 96–112)
CO2: 24 mEq/L (ref 19–32)
Calcium: 10.4 mg/dL (ref 8.4–10.5)
Creat: 0.88 mg/dL (ref 0.50–1.10)
Glucose, Bld: 89 mg/dL (ref 70–99)
Potassium: 4.2 mEq/L (ref 3.5–5.3)
SODIUM: 136 meq/L (ref 135–145)
TOTAL PROTEIN: 7.7 g/dL (ref 6.0–8.3)
Total Bilirubin: 0.6 mg/dL (ref 0.2–1.2)

## 2014-03-16 LAB — LIPID PANEL
Cholesterol: 205 mg/dL — ABNORMAL HIGH (ref 0–200)
HDL: 103 mg/dL (ref >39)
LDL CALC: 80 mg/dL (ref 0–99)
Total CHOL/HDL Ratio: 2 Ratio
Triglycerides: 108 mg/dL (ref <150)
VLDL: 22 mg/dL (ref 0–40)

## 2014-03-16 LAB — T4, FREE: FREE T4: 1.06 ng/dL (ref 0.80–1.80)

## 2014-03-16 LAB — TSH: TSH: 0.857 u[IU]/mL (ref 0.350–4.500)

## 2014-03-16 NOTE — Patient Instructions (Signed)
PAtient to make her own appointmnet with GI  To lab today

## 2014-03-17 LAB — VITAMIN D 25 HYDROXY (VIT D DEFICIENCY, FRACTURES): Vit D, 25-Hydroxy: 53 ng/mL (ref 30–89)

## 2014-03-18 ENCOUNTER — Encounter: Payer: Self-pay | Admitting: Internal Medicine

## 2014-03-19 ENCOUNTER — Telehealth: Payer: Self-pay | Admitting: Internal Medicine

## 2014-03-19 ENCOUNTER — Encounter: Payer: Self-pay | Admitting: Internal Medicine

## 2014-03-19 NOTE — Telephone Encounter (Signed)
Left message regarding laba results

## 2014-03-24 ENCOUNTER — Encounter: Payer: Self-pay | Admitting: Internal Medicine

## 2014-03-24 LAB — CELIAC PANEL 10
Endomysial Screen: NEGATIVE
GLIADIN IGA: 12 U/mL (ref <20)
GLIADIN IGG: 4.6 U/mL (ref <20)
IgA: 142 mg/dL (ref 69–380)
Tissue Transglut Ab: 7.6 U/mL (ref <20)
Tissue Transglutaminase Ab, IgA: 2.9 U/mL (ref <20)

## 2014-03-25 ENCOUNTER — Telehealth: Payer: Self-pay | Admitting: *Deleted

## 2014-03-25 NOTE — Telephone Encounter (Signed)
Message copied by Baldomero Lamy on Wed Mar 25, 2014 10:34 AM ------      Message from: Emi Belfast D      Created: Sun Mar 22, 2014  9:46 PM       [facility]             Call [place] and give her a 30 min  Ov with me in 3 months.  I left her a My chart message that I need to see her and recheck her thyroid blood test then            Message back with appt date ------

## 2014-03-25 NOTE — Telephone Encounter (Signed)
Nisa made an appt for 06/15/14 at 12:15.

## 2014-04-02 ENCOUNTER — Encounter: Payer: Self-pay | Admitting: Sports Medicine

## 2014-04-02 ENCOUNTER — Ambulatory Visit (INDEPENDENT_AMBULATORY_CARE_PROVIDER_SITE_OTHER): Payer: 59 | Admitting: Sports Medicine

## 2014-04-02 VITALS — BP 124/75 | HR 61 | Ht 65.0 in | Wt 145.0 lb

## 2014-04-02 DIAGNOSIS — M7671 Peroneal tendinitis, right leg: Secondary | ICD-10-CM

## 2014-04-02 DIAGNOSIS — B070 Plantar wart: Secondary | ICD-10-CM

## 2014-04-02 DIAGNOSIS — M7750 Other enthesopathy of unspecified foot: Secondary | ICD-10-CM

## 2014-04-02 HISTORY — DX: Plantar wart: B07.0

## 2014-04-02 MED ORDER — IMIQUIMOD 5 % EX CREA: TOPICAL_CREAM | CUTANEOUS | Status: DC

## 2014-04-02 NOTE — Patient Instructions (Signed)
Plantar Wart Warts are benign (noncancerous) growths of the outer skin layer. They can occur at any time in life but are most common during childhood and the teen years. Warts can occur on many skin surfaces of the body. When they occur on the underside (sole) of your foot they are called plantar warts. They often emerge in groups with several small warts encircling a larger growth. CAUSES  Human papillomavirus (HPV) is the cause of plantar warts. HPV attacks a break in the skin of the foot. Walking barefoot can lead to exposure to the wart virus. Plantar warts tend to develop over areas of pressure such as the heel and ball of the foot. Plantar warts often grow into the deeper layers of skin. They may spread to other areas of the sole but cannot spread to other areas of the body. SYMPTOMS  You may also notice a growth on the undersurface of your foot. The wart may grow directly into the sole of the foot, or rise above the surface of the skin on the sole of the foot, or both. They are most often flat from pressure. Warts generally do not cause itching but may cause pain in the area of the wart when you put weight on your foot. DIAGNOSIS  Diagnosis is made by physical examination. This means your caregiver discovers it while examining your foot.  TREATMENT  There are many ways to treat plantar warts. However, warts are very tough. Sometimes it is difficult to treat them so that they go away completely and do not grow back. Any treatment must be done regularly to work. If left untreated, most plantar warts will eventually disappear over a period of one to two years. Treatments you can do at home include:  Putting duct tape over the top of the wart (occlusion), has been found to be effective over several months. The duct tape should be removed each night and reapplied until the wart has disappeared.  Placing over-the-counter medications on top of the wart to help kill the wart virus and remove the wart  tissue (salicylic acid, cantharidin, and dichloroacetic acid ) are useful. These are called keratolytic agents. These medications make the skin soft and gradually layers will shed away. Theses compounds are usually placed on the wart each night and then covered with a band-aid. They are also available in pre-medicated band-aid form. Avoid surrounding skin when applying these liquids as these medications can burn healthy skin. The treatment may take several months of nightly use to be effective.  Cryotherapy to freeze the wart has recently become available over-the-counter for children 4 years and older. This system makes use of a soft narrow applicator connected to a bottle of compressed cold liquid that is applied directly to the wart. This medication can burn health skin and should be used with caution.  As with all over-the-counter medications, read the directions carefully before use. Treatments generally done in your caregiver's office include:  Some aggressive treatments may cause discomfort, discoloration and scaring of the surrounding skin. The risks and benefits of treatment should be discussed with your caregiver.  Freezing the wart with liquid nitrogen (cryotherapy, see above).  Burning the wart with use of very high heat (cautery).  Injecting medication into the wart.  Surgically removing or laser treatment of the wart.  Your caregiver may refer you to a dermatologist for difficult to treat, large sized or large numbers of warts. HOME CARE INSTRUCTIONS   Soak the affected area in warm water. Dry   the area completely when you are done. Remove the top layer of softened skin, then apply the chosen topical medication and reapply a bandage.  Remove the bandage daily and file excess wart tissue (pumice stone works well for this purpose). Repeat the entire process daily or every other day for weeks until the plantar wart disappears.  Several brands of salicylic acid pads are available as  over-the-counter remedies.  Pain can be relieved by wearing a doughnut bandage. This is a bandage with a hole in it. The bandage is put on with the hole over the wart. This helps take the pressure off the wart and gives pain relief. To help prevent plantar warts:  Wear shoes and socks and change them daily.  Keep feet clean and dry.  Check your feet and your children's feet regularly.  Avoid direct contact with warts on other people.  Have growths, or changes on your skin checked by your caregiver. Document Released: 12/09/2003 Document Revised: 12/11/2011 Document Reviewed: 05/19/2009 [facility] Patient Information 2015 [place], [place]. This information is not intended to replace advice given to you by your health care provider. Make sure you discuss any questions you have with your health care provider.

## 2014-04-02 NOTE — Progress Notes (Signed)
  Subjective:    CC: Followup  HPI: Right peroneal tendinitis: Resolved with custom orthotics, home rehabilitation, and Mobic.  Plantar wart: Improved significantly with cryotherapy, still present, does not desire repeat cryotherapy.  Past medical history, Surgical history, Family history not pertinant except as noted below, Social history, Allergies, and medications have been entered into the medical record, reviewed, and no changes needed.   Review of Systems: No fevers, chills, night sweats, weight loss, chest pain, or shortness of breath.   Objective:    General: Well Developed, well nourished, and in no acute distress.  Neuro: Alert and oriented x3, extra-ocular muscles intact, sensation grossly intact.  HEENT: Normocephalic, atraumatic, pupils equal round reactive to light, neck supple, no masses, no lymphadenopathy, thyroid nonpalpable.  Skin: Warm and dry, no rashes. Cardiac: Regular rate and rhythm, no murmurs rubs or gallops, no lower extremity edema.  Respiratory: Clear to auscultation bilaterally. Not using accessory muscles, speaking in full sentences. Right Ankle: No visible erythema or swelling. Range of motion is full in all directions. Strength is 5/5 in all directions. Stable lateral and medial ligaments; squeeze test and kleiger test unremarkable; Talar dome nontender; No pain at base of 5th MT; No tenderness over cuboid; No tenderness over N spot or navicular prominence No tenderness on posterior aspects of lateral and medial malleolus No sign of peroneal tendon subluxations or tenderness to palpation Negative tarsal tunnel tinel's Able to walk 4 steps.  Impression and Recommendations:

## 2014-04-02 NOTE — Assessment & Plan Note (Signed)
Completely resolved with custom orthotics and home rehabilitation. As needed, if pain recurs she would be a candidate for an ultrasound-guided peroneal sheath injection.

## 2014-04-02 NOTE — Assessment & Plan Note (Signed)
Does not desire additional cryotherapy today. Adding a prescription of Aldara

## 2014-04-09 ENCOUNTER — Other Ambulatory Visit: Payer: Self-pay | Admitting: Gastroenterology

## 2014-04-09 DIAGNOSIS — R1013 Epigastric pain: Secondary | ICD-10-CM

## 2014-04-09 DIAGNOSIS — R110 Nausea: Secondary | ICD-10-CM

## 2014-04-10 ENCOUNTER — Ambulatory Visit (HOSPITAL_COMMUNITY)
Admission: RE | Admit: 2014-04-10 | Discharge: 2014-04-10 | Disposition: A | Discharge status: Home / Self Care | Payer: 59 | Source: Ambulatory Visit | Attending: Gastroenterology | Admitting: Gastroenterology

## 2014-04-10 DIAGNOSIS — R1013 Epigastric pain: Secondary | ICD-10-CM | POA: Insufficient documentation

## 2014-04-10 DIAGNOSIS — R110 Nausea: Secondary | ICD-10-CM | POA: Insufficient documentation

## 2014-04-16 ENCOUNTER — Ambulatory Visit (HOSPITAL_COMMUNITY)
Admission: RE | Admit: 2014-04-16 | Discharge: 2014-04-16 | Disposition: A | Discharge status: Home / Self Care | Payer: 59 | Source: Ambulatory Visit | Attending: Gastroenterology | Admitting: Gastroenterology

## 2014-04-16 DIAGNOSIS — R110 Nausea: Secondary | ICD-10-CM | POA: Insufficient documentation

## 2014-04-16 DIAGNOSIS — R1013 Epigastric pain: Secondary | ICD-10-CM | POA: Insufficient documentation

## 2014-04-16 MED ORDER — STERILE WATER FOR INJECTION IJ SOLN
INTRAMUSCULAR | Status: AC
  Filled 2014-04-16: qty 10

## 2014-04-16 MED ORDER — SINCALIDE 5 MCG IJ SOLR: 0.0200 ug/kg | Freq: Once | INTRAMUSCULAR | Status: DC

## 2014-04-16 MED ORDER — SINCALIDE 5 MCG IJ SOLR
INTRAMUSCULAR | Status: AC
  Filled 2014-04-16: qty 5

## 2014-04-16 MED ORDER — TECHNETIUM TC 99M MEBROFENIN IV KIT
5.0000 | PACK | Freq: Once | INTRAVENOUS | Status: AC | PRN
  Administered 2014-04-16: 5 via INTRAVENOUS

## 2014-04-23 ENCOUNTER — Encounter (INDEPENDENT_AMBULATORY_CARE_PROVIDER_SITE_OTHER): Payer: Self-pay | Admitting: Surgery

## 2014-04-24 ENCOUNTER — Encounter (INDEPENDENT_AMBULATORY_CARE_PROVIDER_SITE_OTHER): Payer: Self-pay | Admitting: Surgery

## 2014-04-24 ENCOUNTER — Ambulatory Visit (INDEPENDENT_AMBULATORY_CARE_PROVIDER_SITE_OTHER): Payer: Commercial Managed Care - PPO | Admitting: Surgery

## 2014-04-24 ENCOUNTER — Telehealth (INDEPENDENT_AMBULATORY_CARE_PROVIDER_SITE_OTHER): Payer: Self-pay

## 2014-04-24 VITALS — BP 112/72 | HR 71 | Temp 98.6°F | Resp 16 | Ht 65.0 in | Wt 143.0 lb

## 2014-04-24 DIAGNOSIS — R197 Diarrhea, unspecified: Secondary | ICD-10-CM

## 2014-04-24 NOTE — Patient Instructions (Signed)
Try some additions to your diet that may include fats to see if this causes diarrhea.

## 2014-04-24 NOTE — Telephone Encounter (Signed)
Called and left message to call our office. RE: Dr. Hassell Done can see Kara Warren today at 4:00pm, arrive at 3:45pm if she would like to cone in.

## 2014-04-24 NOTE — Progress Notes (Signed)
Chief Complaint:  Intermittent upper abdominal pain  History of Present Illness:  Kara Warren is an 58 y.o. female referred with biliary dyskinesia (<3% ejection fraction on HIDA) but no pain with CCK stimulation.  She is set for upper endo with Dr. Collene Mares on Monday.  Hopefully this will include a H pylori test.  She may need a colonoscopy to look for colitis.    Past Medical History  Diagnosis Date  . Colon polyp   . Menopause   . Migraine   . Endometrial polyp 8/08    benign  . Skene's gland abscess 2009  . Sinusitis, chronic     Past Surgical History  Procedure Laterality Date  . Uterine ablation  2007  . Dilation and curettage of uterus  8/08    Current Outpatient Prescriptions  Medication Sig Dispense Refill  . BLACK COHOSH EXTRACT PO Take 1 tablet by mouth.      . calcium-vitamin D (CALCIUM 500+D) 500-200 MG-UNIT per tablet Take 1 tablet by mouth daily.        . citalopram (CELEXA) 20 MG tablet TAKE 1 TABLET BY MOUTH ONCE DAILY  90 tablet  3  . imiquimod (ALDARA) 5 % cream Apply topically 3 (three) times a week.  24 each  0  . loratadine (CLARITIN) 10 MG tablet Take 10 mg by mouth as needed.       Marland Kitchen Lysine 500 MG TABS Take 1 tablet by mouth daily.        . meloxicam (MOBIC) 15 MG tablet One tab PO qAM with breakfast for 2 weeks, then daily prn pain.  30 tablet  3  . Multiple Vitamins-Minerals (MULTI FOR HER 50+) TABS Take 1 tablet by mouth daily.        . pantoprazole (PROTONIX) 40 MG tablet TAKE 1 TABLET BY MOUTH DAILY.  30 tablet  11  . Probiotic Product (PROBIOTIC PO) Take 1 capsule by mouth daily. Called ultra spectrum      . Psyllium-Calcium (METAMUCIL PLUS CALCIUM) CAPS Take 1 capsule by mouth daily.         No current facility-administered medications for this visit.   Review of patient's allergies indicates no known allergies. Family History  Problem Relation Age of Onset  . Breast cancer Paternal Grandmother   . Colon cancer Maternal Grandfather   . Colon  polyps Father   . Ulcerative colitis Father   . CAD Father     Stent placement at age 16  . Glaucoma Father   . Hypertension Father   . Colitis Father   . Colon polyps Mother   . Osteoporosis Mother   . Thyroid disease Mother     hypo  . Glaucoma Mother   . Diverticulosis Mother   . Sudden death Neg Hx   . Hyperlipidemia Neg Hx   . Heart attack Neg Hx   . Diabetes Neg Hx    Social History:   reports that she quit smoking about 35 years ago. Her smoking use included Cigarettes. She has a .3 pack-year smoking history. She has never used smokeless tobacco. She reports that she drinks about 4.2 ounces of alcohol per week. She reports that she does not use illicit drugs.   REVIEW OF SYSTEMS : No back pain, no lower abdominal swelling, father with colitis  Physical Exam:   Blood pressure 112/72, pulse 71, temperature 98.6 F (37 C), temperature source Temporal, resp. rate 16, height 5\' 5"  (1.651 m), weight 143 lb (64.864 kg). Body  mass index is 23.8 kg/(m^2).  Gen:  WDWN WF NAD  Neurological: Alert and oriented to person, place, and time. Motor and sensory function is grossly intact  LABORATORY RESULTS: No results found for this or any previous visit (from the past 48 hour(s)).   RADIOLOGY RESULTS: No results found.  Problem List: Patient Active Problem List   Diagnosis Date Noted  . Plantar wart 04/02/2014  . Peroneal tendinitis of right lower extremity 03/12/2014  . Left wrist pain 07/30/2013  . Left hip pain 07/30/2013  . Atypical chest pain 03/05/2013  . Diverticulosis 07/27/2012  . Family history of osteoporosis 12/28/2011  . Right foot pain 08/09/2011  . Sinusitis, chronic   . Colon polyp   . Endometrial polyp   . Menopause   . Skene's gland abscess   . Migraine   . TRANSAMINASES, SERUM, ELEVATED 12/07/2009  . LUNG NODULE 11/19/2009  . Nonspecific (abnormal) findings on radiological and other examination of body structure 11/19/2009  . [place] LUNG FIELD 11/19/2009  . RHINITIS 06/21/2009  . Acute sinusitis, unspecified 05/20/2009  . HIP PAIN, RIGHT 04/12/2009  . NEUTROPENIA UNSPECIFIED 10/07/2008  . HYPERLIPIDEMIA 08/25/2008  . DEPRESSION 08/25/2008    Assessment & Plan: Would rule out H Pylori and colitis.  May need lap chole for biliary dyskinesia.  Will see back in 2 weeks.  Will discuss with Dr. Collene Mares after upper endo.     Matt B. Hassell Done, MD, [facility] [phone] beeper [phone]  04/24/2014 5:29 PM

## 2014-05-18 ENCOUNTER — Ambulatory Visit (INDEPENDENT_AMBULATORY_CARE_PROVIDER_SITE_OTHER): Payer: Commercial Managed Care - PPO | Admitting: Surgery

## 2014-05-21 ENCOUNTER — Telehealth (INDEPENDENT_AMBULATORY_CARE_PROVIDER_SITE_OTHER): Payer: Self-pay | Admitting: *Deleted

## 2014-05-21 ENCOUNTER — Encounter (INDEPENDENT_AMBULATORY_CARE_PROVIDER_SITE_OTHER): Payer: Self-pay | Admitting: Surgery

## 2014-05-21 ENCOUNTER — Ambulatory Visit (INDEPENDENT_AMBULATORY_CARE_PROVIDER_SITE_OTHER): Payer: Commercial Managed Care - PPO | Admitting: Surgery

## 2014-05-21 VITALS — BP 100/60 | Resp 16 | Ht 65.0 in | Wt 140.0 lb

## 2014-05-21 DIAGNOSIS — R1013 Epigastric pain: Secondary | ICD-10-CM

## 2014-05-21 DIAGNOSIS — G8929 Other chronic pain: Secondary | ICD-10-CM

## 2014-05-21 NOTE — Telephone Encounter (Signed)
LMOM for pt to return my call regarding her CT abd/pelvis.  Advise pt it has been scheduled at [facility], for 05-28-14, arriving at 9:15.  Please make sure pt has contrast and advise her to drink the 1st bottle at 7:30 and 2nd bottle at 8:30.  Advise pt no solid foods after 5:30 that morning.  If pt does not have contrast, she can either go to [place] and get it before the day of the test or come to the office.  If she prefers to come to the office, it will have to get ready.  Thanks!  Anderson Malta

## 2014-05-21 NOTE — Progress Notes (Signed)
Kara Warren 58 y.o.  Body mass index is 23.3 kg/(m^2).  Patient Active Problem List   Diagnosis Date Noted  . Plantar wart 04/02/2014  . Peroneal tendinitis of right lower extremity 03/12/2014  . Left wrist pain 07/30/2013  . Left hip pain 07/30/2013  . Atypical chest pain 03/05/2013  . Diverticulosis 07/27/2012  . Family history of osteoporosis 12/28/2011  . Right foot pain 08/09/2011  . Sinusitis, chronic   . Colon polyp   . Endometrial polyp   . Menopause   . Skene's gland abscess   . Migraine   . TRANSAMINASES, SERUM, ELEVATED 12/07/2009  . LUNG NODULE 11/19/2009  . Nonspecific (abnormal) findings on radiological and other examination of body structure 11/19/2009  . [place] LUNG FIELD 11/19/2009  . RHINITIS 06/21/2009  . Acute sinusitis, unspecified 05/20/2009  . HIP PAIN, RIGHT 04/12/2009  . NEUTROPENIA UNSPECIFIED 10/07/2008  . HYPERLIPIDEMIA 08/25/2008  . DEPRESSION 08/25/2008    No Known Allergies    Past Surgical History  Procedure Laterality Date  . Uterine ablation  2007  . Dilation and curettage of uterus  8/08   SCHOENHOFF,DEBBIE, MD No diagnosis found.  Abdominal pain has been better but still there. There are concerned because this is some upper abdominal discomfort. I think we will get a CT scan abdomen and pelvis to rule out any occult neoplasm.  If her pain persists it may be that we'll be doing a laparoscopic cholecystectomy however I tried to wait and see if things don't abate. I'll see her in a couple 3 weeks after the CT scan abdomen and pelvis. Matt B. Hassell Done, MD, [facility] [phone] beeper [phone]  05/21/2014 1:33 PM

## 2014-05-22 NOTE — Telephone Encounter (Signed)
Informed pt of message below.  Pt will need a 3 week f/u appt with Dr Hassell Done, didn't see anything open will have his assistant look at his schedule and we will contact pt with a time and date. Pt verbalized understanding.

## 2014-05-28 ENCOUNTER — Ambulatory Visit
Admission: RE | Admit: 2014-05-28 | Discharge: 2014-05-28 | Disposition: A | Discharge status: Home / Self Care | Payer: 59 | Source: Ambulatory Visit | Attending: Surgery | Admitting: Surgery

## 2014-05-28 DIAGNOSIS — G8929 Other chronic pain: Secondary | ICD-10-CM

## 2014-05-28 DIAGNOSIS — R1013 Epigastric pain: Principal | ICD-10-CM

## 2014-05-28 MED ORDER — IOHEXOL 300 MG/ML  SOLN
100.0000 mL | Freq: Once | INTRAMUSCULAR | Status: AC | PRN
  Administered 2014-05-28: 100 mL via INTRAVENOUS

## 2014-05-29 ENCOUNTER — Telehealth (INDEPENDENT_AMBULATORY_CARE_PROVIDER_SITE_OTHER): Payer: Self-pay | Admitting: *Deleted

## 2014-05-29 NOTE — Telephone Encounter (Signed)
Called and spoke to patient to give CT results (No acute findings in the abdomen or pelvis)  Patient aware that Dr. Hassell Done will review imaging and we will call patient with appointment once we have opening on Dr. Carlye Grippe schedule.

## 2014-05-29 NOTE — Telephone Encounter (Signed)
Pt called and wants to know if her CT results are in yet.  She also stated that she has called several times for a f/u appt wth Dr. Hassell Done, but hasn't received a return call back as of yet.  Please advise and I will be glad to call the pt back with a f/u appt.  Thannks!  Anderson Malta

## 2014-06-15 ENCOUNTER — Encounter: Payer: Self-pay | Admitting: Internal Medicine

## 2014-06-15 ENCOUNTER — Ambulatory Visit (INDEPENDENT_AMBULATORY_CARE_PROVIDER_SITE_OTHER): Payer: 59 | Admitting: Internal Medicine

## 2014-06-15 VITALS — BP 129/79 | HR 60 | Temp 97.9°F | Resp 15 | Ht 65.0 in | Wt 140.0 lb

## 2014-06-15 DIAGNOSIS — R5383 Other fatigue: Principal | ICD-10-CM

## 2014-06-15 DIAGNOSIS — R5381 Other malaise: Secondary | ICD-10-CM

## 2014-06-15 DIAGNOSIS — R1013 Epigastric pain: Secondary | ICD-10-CM

## 2014-06-15 LAB — TSH: TSH: 0.926 u[IU]/mL (ref 0.350–4.500)

## 2014-06-15 NOTE — Progress Notes (Signed)
Subjective:    Patient ID: Kara Warren, female    DOB: 1955-12-28, 58 y.o.   MRN: [id number]  HPI  Kara Warren is here to follow up on her epigastric discomfort.    She has seen both Dr. Collene Mares and Dr. Hassell Done.  HIDA scan showed some evidence of biliary dyskinesia but pt says her symptoms are 80% improved with 40 mg Protonix dose  Still ongoing stress with 77+ year old parents living at home.   See TSH it has been trending downward   No Known Allergies Past Medical History  Diagnosis Date  . Colon polyp   . Menopause   . Migraine   . Endometrial polyp 8/08    benign  . Skene's gland abscess 2009  . Sinusitis, chronic    Past Surgical History  Procedure Laterality Date  . Uterine ablation  2007  . Dilation and curettage of uterus  8/08   History   Social History  . Marital Status: Single    Spouse Name: N/A    Number of Children: 0  . Years of Education: N/A   Occupational History  . RN [employer]   Social History Main Topics  . Smoking status: Former Smoker -- 0.10 packs/day for 3 years    Types: Cigarettes    Quit date: 10/02/1978  . Smokeless tobacco: Never Used  . Alcohol Use: 4.2 oz/week    7 Glasses of wine per week     Comment: per week  . Drug Use: No  . Sexual Activity: Yes   Other Topics Concern  . Not on file   Social History Narrative  . No narrative on file   Family History  Problem Relation Age of Onset  . Breast cancer Paternal Grandmother   . Colon cancer Maternal Grandfather   . Colon polyps Father   . Ulcerative colitis Father   . CAD Father     Stent placement at age 70  . Glaucoma Father   . Hypertension Father   . Colitis Father   . Colon polyps Mother   . Osteoporosis Mother   . Thyroid disease Mother     hypo  . Glaucoma Mother   . Diverticulosis Mother   . Sudden death Neg Hx   . Hyperlipidemia Neg Hx   . Heart attack Neg Hx   . Diabetes Neg Hx    Patient Active Problem List   Diagnosis Date Noted  . Plantar wart  04/02/2014  . Peroneal tendinitis of right lower extremity 03/12/2014  . Left wrist pain 07/30/2013  . Left hip pain 07/30/2013  . Atypical chest pain 03/05/2013  . Diverticulosis 07/27/2012  . Family history of osteoporosis 12/28/2011  . Right foot pain 08/09/2011  . Sinusitis, chronic   . Colon polyp   . Endometrial polyp   . Menopause   . Skene's gland abscess   . Migraine   . TRANSAMINASES, SERUM, ELEVATED 12/07/2009  . LUNG NODULE 11/19/2009  . Nonspecific (abnormal) findings on radiological and other examination of body structure 11/19/2009  . [place] LUNG FIELD 11/19/2009  . RHINITIS 06/21/2009  . Acute sinusitis, unspecified 05/20/2009  . HIP PAIN, RIGHT 04/12/2009  . NEUTROPENIA UNSPECIFIED 10/07/2008  . HYPERLIPIDEMIA 08/25/2008  . DEPRESSION 08/25/2008   Current Outpatient Prescriptions on File Prior to Visit  Medication Sig Dispense Refill  . calcium-vitamin D (CALCIUM 500+D) 500-200 MG-UNIT per tablet Take 1 tablet by mouth daily.        Marland Kitchen  citalopram (CELEXA) 20 MG tablet TAKE 1 TABLET BY MOUTH ONCE DAILY  90 tablet  3  . loratadine (CLARITIN) 10 MG tablet Take 10 mg by mouth as needed.       Marland Kitchen Lysine 500 MG TABS Take 1 tablet by mouth daily.        . Multiple Vitamins-Minerals (MULTI FOR HER 50+) TABS Take 1 tablet by mouth daily.        . pantoprazole (PROTONIX) 40 MG tablet TAKE 1 TABLET BY MOUTH DAILY.  30 tablet  11  . Probiotic Product (PROBIOTIC PO) Take 1 capsule by mouth daily. Called ultra spectrum       No current facility-administered medications on file prior to visit.      Review of Systems See HPI    Objective:   Physical Exam Physical Exam  Nursing note and vitals reviewed.  Constitutional: She is oriented to person, place, and time. She appears well-developed and well-nourished.  HENT:  Head: Normocephalic and atraumatic.  Cardiovascular: Normal rate and regular rhythm. Exam reveals no gallop and no friction  rub.  No murmur heard.  Pulmonary/Chest: Breath sounds normal. She has no wheezes. She has no rales.  Neurological: She is alert and oriented to person, place, and time.  Skin: Skin is warm and dry.  Psychiatric: She has a normal mood and affect. Her behavior is normal.         Assessment & Plan:  TSH  Will recheck today   Epigasric / abd pain improved   Continue Protonix   Biliary dyskinesia on HIDA

## 2014-06-16 ENCOUNTER — Encounter: Payer: Self-pay | Admitting: Internal Medicine

## 2014-07-15 ENCOUNTER — Emergency Department (INDEPENDENT_AMBULATORY_CARE_PROVIDER_SITE_OTHER): Payer: 59

## 2014-07-15 ENCOUNTER — Emergency Department
Admission: EM | Admit: 2014-07-15 | Discharge: 2014-07-15 | Disposition: A | Discharge status: Home / Self Care | Payer: 59 | Source: Home / Self Care | Attending: Family Medicine | Admitting: Family Medicine

## 2014-07-15 ENCOUNTER — Encounter: Payer: Self-pay | Admitting: Emergency Medicine

## 2014-07-15 DIAGNOSIS — R059 Cough, unspecified: Secondary | ICD-10-CM

## 2014-07-15 DIAGNOSIS — R509 Fever, unspecified: Secondary | ICD-10-CM

## 2014-07-15 DIAGNOSIS — R05 Cough: Secondary | ICD-10-CM

## 2014-07-15 DIAGNOSIS — R0602 Shortness of breath: Secondary | ICD-10-CM

## 2014-07-15 MED ORDER — PREDNISONE 10 MG PO TABS: 30.0000 mg | ORAL_TABLET | Freq: Every day | ORAL | Status: DC

## 2014-07-15 MED ORDER — IPRATROPIUM BROMIDE 0.06 % NA SOLN: 2.0000 | Freq: Four times a day (QID) | NASAL | Status: DC

## 2014-07-15 MED ORDER — GUAIFENESIN-CODEINE 100-10 MG/5ML PO SOLN: 5.0000 mL | Freq: Every evening | ORAL | Status: DC | PRN

## 2014-07-15 MED ORDER — IPRATROPIUM-ALBUTEROL 0.5-2.5 (3) MG/3ML IN SOLN
3.0000 mL | Freq: Once | RESPIRATORY_TRACT | Status: AC
  Administered 2014-07-15: 3 mL via RESPIRATORY_TRACT

## 2014-07-15 NOTE — ED Provider Notes (Signed)
Kara Warren is a 58 y.o. female who presents to Urgent Care today for cough. Patient has a five-day history of cough congestion shortness of breath and chest tightness. The cough is nonproductive. No chest pains or palpitations. Patient additionally notes a mild change in her voice. She denies any history of asthma. She's tried some leftover prednisone 10 mg 30 mg and 10 mg of the past 3 days which has not helped very much. No nausea vomiting or diarrhea.   Past Medical History  Diagnosis Date  . Colon polyp   . Menopause   . Migraine   . Endometrial polyp 8/08    benign  . Skene's gland abscess 2009  . Sinusitis, chronic    History  Substance Use Topics  . Smoking status: Former Smoker -- 0.10 packs/day for 3 years    Types: Cigarettes    Quit date: 10/02/1978  . Smokeless tobacco: Never Used  . Alcohol Use: 4.2 oz/week    7 Glasses of wine per week     Comment: per week   ROS as above Medications: No current facility-administered medications for this encounter.   Current Outpatient Prescriptions  Medication Sig Dispense Refill  . calcium-vitamin D (CALCIUM 500+D) 500-200 MG-UNIT per tablet Take 1 tablet by mouth daily.        . citalopram (CELEXA) 20 MG tablet TAKE 1 TABLET BY MOUTH ONCE DAILY  90 tablet  3  . guaiFENesin-codeine 100-10 MG/5ML syrup Take 5 mLs by mouth at bedtime as needed for cough.  120 mL  0  . ipratropium (ATROVENT) 0.06 % nasal spray Place 2 sprays into both nostrils 4 (four) times daily.  15 mL  1  . loratadine (CLARITIN) 10 MG tablet Take 10 mg by mouth as needed.       Marland Kitchen Lysine 500 MG TABS Take 1 tablet by mouth daily.        . Multiple Vitamins-Minerals (MULTI FOR HER 50+) TABS Take 1 tablet by mouth daily.        . pantoprazole (PROTONIX) 40 MG tablet TAKE 1 TABLET BY MOUTH DAILY.  30 tablet  11  . predniSONE (DELTASONE) 10 MG tablet Take 3 tablets (30 mg total) by mouth daily.  15 tablet  0  . Probiotic Product (PROBIOTIC PO) Take 1 capsule by  mouth daily. Called ultra spectrum        Exam:  BP 131/84  Pulse 61  Temp(Src) 97.8 F (36.6 C) (Oral)  Ht 5\' 5"  (1.651 m)  Wt 140 lb (63.504 kg)  BMI 23.30 kg/m2  SpO2 100% Gen: Well NAD HEENT: EOMI,  MMM posterior pharynx with cobblestoning. Normal tympanic membranes bilaterally. Lungs: Normal work of breathing. CTABL Heart: RRR no MRG Abd: NABS, Soft. Nondistended, Nontender Exts: Brisk capillary refill, warm and well perfused.   Patient was given a 2.5 mg/0.5 mg DuoNeb nebulizer treatment, and did not have much improvement.  No results found for this or any previous visit (from the past 24 hour(s)). Dg Chest 2 View  07/15/2014   CLINICAL DATA:  Cough and fever  EXAM: CHEST  2 VIEW  COMPARISON:  02/26/2013  FINDINGS: The heart size and mediastinal contours are within normal limits. Both lungs are clear. The visualized skeletal structures are unremarkable.  IMPRESSION: No active cardiopulmonary disease.   Electronically Signed   By: Franchot Gallo M.D.   On: 07/15/2014 14:31    Assessment and Plan: 58 y.o. female with Viral URI with cough. Perhaps some bronchitis component.  Plan to treat with prednisone Atrovent nasal spray and codeine containing cough medication. Return to clinic as needed. Followup as needed.  Discussed warning signs or symptoms. Please see discharge instructions. Patient expresses understanding.     Gregor Hams, MD 07/15/14 [phone]

## 2014-07-15 NOTE — ED Notes (Signed)
Dry cough, congestion, SOB x 5 days

## 2014-07-15 NOTE — Discharge Instructions (Signed)
Thank you for coming in today. Call or go to the emergency room if you get worse, have trouble breathing, have chest pains, or palpitations.  Do not drive after taking codeine.    Cough, Adult  A cough is a reflex that helps clear your throat and airways. It can help heal the body or may be a reaction to an irritated airway. A cough may only last 2 or 3 weeks (acute) or may last more than 8 weeks (chronic).  CAUSES Acute cough:  Viral or bacterial infections. Chronic cough:  Infections.  Allergies.  Asthma.  Post-nasal drip.  Smoking.  Heartburn or acid reflux.  Some medicines.  Chronic lung problems (COPD).  Cancer. SYMPTOMS   Cough.  Fever.  Chest pain.  Increased breathing rate.  High-pitched whistling sound when breathing (wheezing).  Colored mucus that you cough up (sputum). TREATMENT   A bacterial cough may be treated with antibiotic medicine.  A viral cough must run its course and will not respond to antibiotics.  Your caregiver may recommend other treatments if you have a chronic cough. HOME CARE INSTRUCTIONS   Only take over-the-counter or prescription medicines for pain, discomfort, or fever as directed by your caregiver. Use cough suppressants only as directed by your caregiver.  Use a cold steam vaporizer or humidifier in your bedroom or home to help loosen secretions.  Sleep in a semi-upright position if your cough is worse at night.  Rest as needed.  Stop smoking if you smoke. SEEK IMMEDIATE MEDICAL CARE IF:   You have pus in your sputum.  Your cough starts to worsen.  You cannot control your cough with suppressants and are losing sleep.  You begin coughing up blood.  You have difficulty breathing.  You develop pain which is getting worse or is uncontrolled with medicine.  You have a fever. MAKE SURE YOU:   Understand these instructions.  Will watch your condition.  Will get help right away if you are not doing well or  get worse. Document Released: 03/17/2011 Document Revised: 12/11/2011 Document Reviewed: 03/17/2011 [facility] Patient Information 2015 [place], [place]. This information is not intended to replace advice given to you by your health care provider. Make sure you discuss any questions you have with your health care provider.

## 2014-07-16 ENCOUNTER — Telehealth: Payer: Self-pay | Admitting: Internal Medicine

## 2014-07-16 ENCOUNTER — Telehealth: Payer: Self-pay

## 2014-07-16 MED ORDER — ALBUTEROL SULFATE HFA 108 (90 BASE) MCG/ACT IN AERS: INHALATION_SPRAY | RESPIRATORY_TRACT | Status: DC

## 2014-07-16 MED ORDER — AZITHROMYCIN 250 MG PO TABS: ORAL_TABLET | ORAL | Status: DC

## 2014-07-16 NOTE — Telephone Encounter (Signed)
Danaja Lasota [phone]   Clarabelle called and she has continually got worst since this past Saturday, with SOB, Coughing, Congestion, feeling bad. She went to Urgent Care yesterday, and they did chest Xray and gave her  guaiFENesin-codeine 100-10 MG/5ML syrup, ipratropium (ATROVENT) 0.06 % nasal spray and predniSONE (DELTASONE) 10 MG tablet. She is wondering if there is something else she should be taking or using like a nebulizer. She does not feel like she is any better today.

## 2014-07-16 NOTE — Telephone Encounter (Signed)
Dr. Coralyn Mark,  There was no room to add pt on the schedule for today. Do you have any suggestions as to what this pt could do to start feeling better? I can call her and tell her to continue meds as written to see if she starts to feel better in a few days if she has not taken meds long enough just yet.  Thanks MH

## 2014-07-16 NOTE — Telephone Encounter (Signed)
Spoke with pt .  NOt feeling better,  Cough symptoms primarily in chest    No fever no SOB  CXR neg at UC   wil give Z-pak can start if not better and Pro-air MDI     If any worsening over weekend she is to go to ER or UC  Pt voices understanding

## 2014-07-22 ENCOUNTER — Encounter: Payer: Self-pay | Admitting: Family Medicine

## 2014-07-22 ENCOUNTER — Ambulatory Visit (INDEPENDENT_AMBULATORY_CARE_PROVIDER_SITE_OTHER): Payer: 59 | Admitting: Family Medicine

## 2014-07-22 ENCOUNTER — Ambulatory Visit (INDEPENDENT_AMBULATORY_CARE_PROVIDER_SITE_OTHER): Payer: 59 | Admitting: Internal Medicine

## 2014-07-22 ENCOUNTER — Encounter: Payer: Self-pay | Admitting: Internal Medicine

## 2014-07-22 VITALS — BP 115/72 | HR 62 | Temp 97.1°F | Resp 16 | Ht 65.0 in | Wt 143.0 lb

## 2014-07-22 VITALS — BP 123/82 | HR 59 | Ht 65.0 in

## 2014-07-22 DIAGNOSIS — R059 Cough, unspecified: Secondary | ICD-10-CM

## 2014-07-22 DIAGNOSIS — J0110 Acute frontal sinusitis, unspecified: Secondary | ICD-10-CM

## 2014-07-22 DIAGNOSIS — S76312A Strain of muscle, fascia and tendon of the posterior muscle group at thigh level, left thigh, initial encounter: Secondary | ICD-10-CM

## 2014-07-22 DIAGNOSIS — J9801 Acute bronchospasm: Secondary | ICD-10-CM

## 2014-07-22 DIAGNOSIS — R05 Cough: Secondary | ICD-10-CM

## 2014-07-22 MED ORDER — CEFTRIAXONE SODIUM 1 G IJ SOLR
500.0000 mg | Freq: Once | INTRAMUSCULAR | Status: AC
  Administered 2014-07-22: 500 mg via INTRAMUSCULAR

## 2014-07-22 MED ORDER — ALBUTEROL SULFATE (2.5 MG/3ML) 0.083% IN NEBU
2.5000 mg | INHALATION_SOLUTION | Freq: Once | RESPIRATORY_TRACT | Status: AC
  Administered 2014-07-22: 2.5 mg via RESPIRATORY_TRACT

## 2014-07-22 MED ORDER — METHYLPREDNISOLONE ACETATE 80 MG/ML IJ SUSP
160.0000 mg | Freq: Once | INTRAMUSCULAR | Status: AC
  Administered 2014-07-22: 160 mg via INTRAMUSCULAR

## 2014-07-22 NOTE — Patient Instructions (Signed)
You have a hamstring strain. Compression sleeve as often as possible the next 6 weeks. Ibuprofen or aleve with food if needed for pain. Heat 15 minutes at a time 3-4 times a day (icing after exercising). In general, would avoid a lot of stairs, running, deep squats. Start physical therapy and do home exercises on days you don't go to therapy. Follow up with me in 6 weeks.

## 2014-07-22 NOTE — Progress Notes (Signed)
Subjective:    Patient ID: Kara Warren, female    DOB: 1956/05/30, 58 y.o.   MRN: [id number]  HPI  Kara Warren is here for acute visit  Was seen at [facility] with URI  CXR neg was given  HHN and nose spray but she is not using the  nose spray.    Still has yellow nasal discharge  .Marland Kitchen She did complete Z-pak that I gave her    Some DOE  Not on File Past Medical History  Diagnosis Date  . Colon polyp   . Menopause   . Migraine   . Endometrial polyp 8/08    benign  . Skene's gland abscess 2009  . Sinusitis, chronic    Past Surgical History  Procedure Laterality Date  . Uterine ablation  2007  . Dilation and curettage of uterus  8/08   History   Social History  . Marital Status: Single    Spouse Name: N/A    Number of Children: 0  . Years of Education: N/A   Occupational History  . RN [employer]   Social History Main Topics  . Smoking status: Former Smoker -- 0.10 packs/day for 3 years    Types: Cigarettes    Quit date: 10/02/1978  . Smokeless tobacco: Never Used  . Alcohol Use: 4.2 oz/week    7 Glasses of wine per week     Comment: per week  . Drug Use: No  . Sexual Activity: Yes   Other Topics Concern  . Not on file   Social History Narrative  . No narrative on file   Family History  Problem Relation Age of Onset  . Breast cancer Paternal Grandmother   . Colon cancer Maternal Grandfather   . Colon polyps Father   . Ulcerative colitis Father   . CAD Father     Stent placement at age 56  . Glaucoma Father   . Hypertension Father   . Colitis Father   . Colon polyps Mother   . Osteoporosis Mother   . Thyroid disease Mother     hypo  . Glaucoma Mother   . Diverticulosis Mother   . Sudden death Neg Hx   . Hyperlipidemia Neg Hx   . Heart attack Neg Hx   . Diabetes Neg Hx    Patient Active Problem List   Diagnosis Date Noted  . Plantar wart 04/02/2014  . Peroneal tendinitis of right lower extremity 03/12/2014  . Left wrist pain 07/30/2013  . Left hip  pain 07/30/2013  . Atypical chest pain 03/05/2013  . Diverticulosis 07/27/2012  . Family history of osteoporosis 12/28/2011  . Right foot pain 08/09/2011  . Sinusitis, chronic   . Colon polyp   . Endometrial polyp   . Menopause   . Skene's gland abscess   . Migraine   . TRANSAMINASES, SERUM, ELEVATED 12/07/2009  . LUNG NODULE 11/19/2009  . Nonspecific (abnormal) findings on radiological and other examination of body structure 11/19/2009  . [place] LUNG FIELD 11/19/2009  . RHINITIS 06/21/2009  . Acute sinusitis, unspecified 05/20/2009  . HIP PAIN, RIGHT 04/12/2009  . NEUTROPENIA UNSPECIFIED 10/07/2008  . HYPERLIPIDEMIA 08/25/2008  . DEPRESSION 08/25/2008   Current Outpatient Prescriptions on File Prior to Visit  Medication Sig Dispense Refill  . albuterol (PROVENTIL HFA;VENTOLIN HFA) 108 (90 BASE) MCG/ACT inhaler 2 inhaltions into lungs q8h prn wheezing or shortness of breath  1 Inhaler  0  . calcium-vitamin D (CALCIUM 500+D)  500-200 MG-UNIT per tablet Take 1 tablet by mouth daily.        . citalopram (CELEXA) 20 MG tablet TAKE 1 TABLET BY MOUTH ONCE DAILY  90 tablet  3  . ipratropium (ATROVENT) 0.06 % nasal spray Place 2 sprays into both nostrils 4 (four) times daily.  15 mL  1  . loratadine (CLARITIN) 10 MG tablet Take 10 mg by mouth as needed.       Marland Kitchen Lysine 500 MG TABS Take 1 tablet by mouth daily.        . Multiple Vitamins-Minerals (MULTI FOR HER 50+) TABS Take 1 tablet by mouth daily.        . pantoprazole (PROTONIX) 40 MG tablet TAKE 1 TABLET BY MOUTH DAILY.  30 tablet  11  . Probiotic Product (PROBIOTIC PO) Take 1 capsule by mouth daily. Called ultra spectrum      . guaiFENesin-codeine 100-10 MG/5ML syrup Take 5 mLs by mouth at bedtime as needed for cough.  120 mL  0   No current facility-administered medications on file prior to visit.      Review of Systems  see HPI    Objective:   Physical Exam Physical Exam  Constitutional: She is  oriented to person, place, and time. She appears well-developed and well-nourished. She is cooperative.  HENT:  Head: Normocephalic and atraumatic.  Right Ear: A middle ear effusion is present.  Left Ear: A middle ear effusion is present.  Nose: Mucosal edema present. Right sinus exhibits maxillary sinus tenderness. Left sinus exhibits maxillary sinus tenderness.  Mouth/Throat: Posterior oropharyngeal erythema present.  Serous effusion bilaterally  Eyes: Conjunctivae and EOM are normal. Pupils are equal, round, and reactive to light.  Neck: Neck supple. Carotid bruit is not present. No mass present.  Cardiovascular: Regular rhythm, normal heart sounds, intact distal pulses and normal pulses. Exam reveals no gallop and no friction rub.  No murmur heard.  Pulmonary/Chest: Breath sounds normal. She has a few end expiratory  wheezing . She has no rhonchi. She has no rales.  Neurological: She is alert and oriented to person, place, and time.  Skin: Skin is warm and dry. No abrasion, no bruising, no ecchymosis and no rash noted. No cyanosis. Nails show no clubbing.  Psychiatric: She has a normal mood and affect. Her speech is normal and behavior is normal.        Assessment & Plan:   Bronchospasm  Will give albuterol HHN in office  Depomedrol 160 mg in office.  Albuterol MDI tid at home   Sinusitis:  Will give Rocephin 500 mg today  Nasal congestion  Afrin nasal spray for next 3 days.

## 2014-07-22 NOTE — Patient Instructions (Signed)
Use Afrin nasal spray one spray q12h for the next 3 days   Take Prednisone as directed

## 2014-07-27 ENCOUNTER — Ambulatory Visit: Payer: 59 | Attending: Family Medicine | Admitting: Physical Therapy

## 2014-07-27 ENCOUNTER — Encounter: Payer: Self-pay | Admitting: Family Medicine

## 2014-07-27 DIAGNOSIS — R5381 Other malaise: Secondary | ICD-10-CM | POA: Insufficient documentation

## 2014-07-27 DIAGNOSIS — S76312D Strain of muscle, fascia and tendon of the posterior muscle group at thigh level, left thigh, subsequent encounter: Secondary | ICD-10-CM | POA: Insufficient documentation

## 2014-07-27 DIAGNOSIS — Z5189 Encounter for other specified aftercare: Secondary | ICD-10-CM | POA: Diagnosis present

## 2014-07-27 DIAGNOSIS — R293 Abnormal posture: Secondary | ICD-10-CM | POA: Diagnosis not present

## 2014-07-27 DIAGNOSIS — R269 Unspecified abnormalities of gait and mobility: Secondary | ICD-10-CM | POA: Diagnosis not present

## 2014-07-27 DIAGNOSIS — S76312A Strain of muscle, fascia and tendon of the posterior muscle group at thigh level, left thigh, initial encounter: Secondary | ICD-10-CM | POA: Insufficient documentation

## 2014-07-27 NOTE — Assessment & Plan Note (Signed)
due to overuse.  Compression sleeve, nsaids.  Heat for spasms.  Start physical therapy and home exercise program.  F/u in 6 weeks.

## 2014-07-27 NOTE — Progress Notes (Signed)
Patient ID: Kara Warren, female   DOB: 08-31-1956, 58 y.o.   MRN: [id number]  PCP: Kelton Pillar, MD  Subjective:   HPI: Patient is a 58 y.o. female here for left hamstring pain.  Patient denies known injury or trauma. She had been running but has been unable to do so without increase of pain in left hamstring. Mainly in midportion of hamstring, mid to medial. No swelling or bruising. Worse with prolonged sitting. Has not tried any rehab exercises for this or a sleeve.  Past Medical History  Diagnosis Date  . Colon polyp   . Menopause   . Migraine   . Endometrial polyp 8/08    benign  . Skene's gland abscess 2009  . Sinusitis, chronic     Current Outpatient Prescriptions on File Prior to Visit  Medication Sig Dispense Refill  . albuterol (PROVENTIL HFA;VENTOLIN HFA) 108 (90 BASE) MCG/ACT inhaler 2 inhaltions into lungs q8h prn wheezing or shortness of breath  1 Inhaler  0  . calcium-vitamin D (CALCIUM 500+D) 500-200 MG-UNIT per tablet Take 1 tablet by mouth daily.        . citalopram (CELEXA) 20 MG tablet TAKE 1 TABLET BY MOUTH ONCE DAILY  90 tablet  3  . guaiFENesin-codeine 100-10 MG/5ML syrup Take 5 mLs by mouth at bedtime as needed for cough.  120 mL  0  . ipratropium (ATROVENT) 0.06 % nasal spray Place 2 sprays into both nostrils 4 (four) times daily.  15 mL  1  . loratadine (CLARITIN) 10 MG tablet Take 10 mg by mouth as needed.       Marland Kitchen Lysine 500 MG TABS Take 1 tablet by mouth daily.        . Multiple Vitamins-Minerals (MULTI FOR HER 50+) TABS Take 1 tablet by mouth daily.        . pantoprazole (PROTONIX) 40 MG tablet TAKE 1 TABLET BY MOUTH DAILY.  30 tablet  11  . Probiotic Product (PROBIOTIC PO) Take 1 capsule by mouth daily. Called ultra spectrum       No current facility-administered medications on file prior to visit.    Past Surgical History  Procedure Laterality Date  . Uterine ablation  2007  . Dilation and curettage of uterus  8/08    No Known  Allergies  History   Social History  . Marital Status: Single    Spouse Name: N/A    Number of Children: 0  . Years of Education: N/A   Occupational History  . RN [employer]   Social History Main Topics  . Smoking status: Former Smoker -- 0.10 packs/day for 3 years    Types: Cigarettes    Quit date: 10/02/1978  . Smokeless tobacco: Never Used  . Alcohol Use: 4.2 oz/week    7 Glasses of wine per week     Comment: per week  . Drug Use: No  . Sexual Activity: Yes   Other Topics Concern  . Not on file   Social History Narrative  . No narrative on file    Family History  Problem Relation Age of Onset  . Breast cancer Paternal Grandmother   . Colon cancer Maternal Grandfather   . Colon polyps Father   . Ulcerative colitis Father   . CAD Father     Stent placement at age 29  . Glaucoma Father   . Hypertension Father   . Colitis Father   . Colon polyps Mother   . Osteoporosis Mother   .  Thyroid disease Mother     hypo  . Glaucoma Mother   . Diverticulosis Mother   . Sudden death Neg Hx   . Hyperlipidemia Neg Hx   . Heart attack Neg Hx   . Diabetes Neg Hx     BP 123/82  Pulse 59  Ht 5\' 5"  (1.651 m)  Review of Systems: See HPI above.    Objective:  Physical Exam:  Gen: NAD  Left leg: No gross deformity, swelling, bruising, muscle defect. TTP medial mid left hamstring.  No other tenderness. FROM knee with pain and 4/5 strength on resisted knee flexion at 30 and 90 degrees. NVI distally.    Assessment & Plan:  1. Left hamstring strain - due to overuse.  Compression sleeve, nsaids.  Heat for spasms.  Start physical therapy and home exercise program.  F/u in 6 weeks.

## 2014-07-29 ENCOUNTER — Ambulatory Visit (INDEPENDENT_AMBULATORY_CARE_PROVIDER_SITE_OTHER): Payer: 59 | Admitting: *Deleted

## 2014-07-29 DIAGNOSIS — Z23 Encounter for immunization: Secondary | ICD-10-CM

## 2014-08-06 ENCOUNTER — Ambulatory Visit: Payer: 59 | Attending: Family Medicine | Admitting: Physical Therapy

## 2014-08-06 DIAGNOSIS — S76312D Strain of muscle, fascia and tendon of the posterior muscle group at thigh level, left thigh, subsequent encounter: Secondary | ICD-10-CM | POA: Insufficient documentation

## 2014-08-06 DIAGNOSIS — R269 Unspecified abnormalities of gait and mobility: Secondary | ICD-10-CM | POA: Diagnosis not present

## 2014-08-06 DIAGNOSIS — R293 Abnormal posture: Secondary | ICD-10-CM | POA: Diagnosis not present

## 2014-08-06 DIAGNOSIS — R5381 Other malaise: Secondary | ICD-10-CM | POA: Diagnosis not present

## 2014-08-06 DIAGNOSIS — Z5189 Encounter for other specified aftercare: Secondary | ICD-10-CM | POA: Insufficient documentation

## 2014-08-06 NOTE — Therapy (Signed)
Physical Therapy Treatment  Patient Details  Name: Kara Warren MRN: [id number] Date of Birth: 1956-04-16  Encounter Date: 08/06/2014      PT End of Session - 08/06/14 1221    Visit Number 2   Number of Visits 12   Date for PT Re-Evaluation 09/10/14   PT Start Time 1105   PT Stop Time 1210   PT Time Calculation (min) 65 min   Activity Tolerance Patient tolerated treatment well  Pt with decreased pain to palpation 2-3/10 after treatment. Pt did not want to have trigger point dry needling today due to excessive soreness.  Pt able to perform Phase ! Protocol  exercise as in treatment      Past Medical History  Diagnosis Date  . Colon polyp   . Menopause   . Migraine   . Endometrial polyp 8/08    benign  . Skene's gland abscess 2009  . Sinusitis, chronic     Past Surgical History  Procedure Laterality Date  . Uterine ablation  2007  . Dilation and curettage of uterus  8/08    There were no vitals taken for this visit.  Visit Diagnosis:  No diagnosis found.          [facility] Adult PT Treatment/Exercise - 08/06/14 1215    High Level Balance   High Level Balance Activities Side stepping;Braiding;Other (comment)   High Level Balance Comments sidestepping using Green T-band resistiance 32feet for 1 minute, braiding 6 ft for 1 minute, Step forward and backward over tape line while moving sideways 3 feet for 1 minute   Exercises   Exercises Lumbar   Lumbar Exercises: Supine   Bridge 5 reps;Other (comment)   Bridge Limitations 20 sec x4   Lumbar Exercises: Quadruped   Plank frontal and side plank each 4 x 20 each   Modalities   Modalities Iontophoresis   Iontophoresis   Type of Iontophoresis Dexamethasone   Location mid left laterall hamstring and L anterior ankle   Manual Therapy   Manual Therapy Myofascial release   Manual Therapy   Other Manual Therapy Left hamstring, piriformis, gastroc and soleus          Education - 08/06/14 1220    Education  provided Yes   Education Details Iontophoresis patch to be removed in 6 hours   Education Details Patient   Methods Explanation;Demonstration   Comprehension Verbalized understanding            PT Long Term Goals - 08/06/14 1308    PT LONG TERM GOAL #1   Title "Demonstrate and verbalize techniques to reduce the risk of re-injury including: lifting, posture, body mechanics.    PT LONG TERM GOAL #2   Title Pt will be independent with advanced HEP LE flexibility and mobility   Time 6   Period Weeks   Status On-going   PT LONG TERM GOAL #3   Title improve LEFS to 15% or better   Time 6   Period Weeks   Status On-going   PT LONG TERM GOAL #4   Title report pain decrease to 2/10 or better in order to return to recreational activities running without exacerbating pain   Time 6   Status On-going   PT LONG TERM GOAL #5   Title tolerate sitting for one hour without exacerbating pain in L hip/LE   Time 6   Period Weeks   Status On-going          Plan - 08/06/14 1223  Clinical Impression Statement (p) Pt was very sore from Trigger point dry needling and would like to wait on further dry needling. Pt has twisted L ankle on Saturday and had full AROM but soreness as in inversion spriain   PT Plan (p) Continue Hamstring Rehabiliation Protocol Phase !        Problem List Patient Active Problem List   Diagnosis Date Noted  . Left hamstring muscle strain 07/27/2014  . Plantar wart 04/02/2014  . Peroneal tendinitis of right lower extremity 03/12/2014  . Left wrist pain 07/30/2013  . Left hip pain 07/30/2013  . Atypical chest pain 03/05/2013  . Diverticulosis 07/27/2012  . Family history of osteoporosis 12/28/2011  . Right foot pain 08/09/2011  . Sinusitis, chronic   . Colon polyp   . Endometrial polyp   . Menopause   . Skene's gland abscess   . Migraine   . TRANSAMINASES, SERUM, ELEVATED 12/07/2009  . LUNG NODULE 11/19/2009  . Nonspecific (abnormal) findings on  radiological and other examination of body structure 11/19/2009  . [place] LUNG FIELD 11/19/2009  . RHINITIS 06/21/2009  . Acute sinusitis, unspecified 05/20/2009  . HIP PAIN, RIGHT 04/12/2009  . NEUTROPENIA UNSPECIFIED 10/07/2008  . HYPERLIPIDEMIA 08/25/2008  . DEPRESSION 08/25/2008                                            Dorothea Ogle 08/06/2014, 1:24 PM

## 2014-08-06 NOTE — Patient Instructions (Addendum)
Stretching: Hamstring (Sitting)   With right leg straight, tuck other foot near groin. Reach down until stretch is felt in back of thigh. Keep back straight. Hold __30-60__ seconds. Repeat __2-3__ times per set. Do ___1_ sets per session. Do __1-2__ sessions per day. 1-2 http://orth.exer.us/661   Copyright  VHI. All rights reserved.   Achilles / Soleus, Standing   Stand, right foot behind, heel on floor and turned slightly out. Lower hips and bend knees. Hold __20-30_ seconds. Repeat __2-3_ times per session. Do _1-2__ sessions per day.  Copyright  VHI. All rights reserved.  Achilles / Gastroc, Standing   Stand, right foot behind, heel on floor and turned slightly out, leg straight, forward leg bent. Move hips forward. Hold _30__ seconds. Repeat __2-3_ times per session. Do __1-2_ sessions per day.  Copyright  VHI. All rights reserved.  Piriformis Stretch - Supine   Pull uninvolved knee across body toward opposite shoulder. Hold slight stretch for __30_ seconds. Repeat with involved leg. Repeat _2-3__ times. Do 1-2___ times per day.  IONTOPHORESIS PATIENT PRECAUTIONS & CONTRAINDICATIONS:  . Redness under one or both electrodes can occur.  This characterized by a uniform redness that usually disappears within 12 hours of treatment. . Small pinhead size blisters may result in response to the drug.  Contact your physician if the problem persists more than 24 hours. . On rare occasions, iontophoresis therapy can result in temporary skin reactions such as rash, inflammation, irritation or burns.  The skin reactions may be the result of individual sensitivity to the ionic solution used, the condition of the skin at the start of treatment, reaction to the materials in the electrodes, allergies or sensitivity to dexamethasone, or a poor connection between the patch and your skin.  Discontinue using iontophoresis if you have any of these reactions and report to your therapist. . Remove  the Patch or electrodes if you have any undue sensation of pain or burning during the treatment and report discomfort to your therapist. . Tell your Therapist if you have had known adverse reactions to the application of electrical current. . If using the Patch, the LED light will turn off when treatment is complete and the patch can be removed.  Approximate treatment time is 1-3 hours.  Remove the patch when light goes off or after 6 hours. . The Patch can be worn during normal activity, however excessive motion where the electrodes have been placed can cause poor contact between the skin and the electrode or uneven electrical current resulting in greater risk of skin irritation. Marland Kitchen Keep out of the reach of children.   . DO NOT use if you have a cardiac pacemaker or any other electrically sensitive implanted device. . DO NOT use if you have a known sensitivity to dexamethasone. . DO NOT use during Magnetic Resonance Imaging (MRI). . DO NOT use over broken or compromised skin (e.g. sunburn, cuts, or acne) due to the increased risk of skin reaction. . DO NOT SHAVE over the area to be treated:  To establish good contact between the Patch and the skin, excessive hair may be clipped. . DO NOT place the Patch or electrodes on or over your eyes, directly over your heart, or brain. DO NOT reuse the Patch or electrodes as this may cause burns to occur.   Copyright  VHI. All rights reserved.

## 2014-08-11 ENCOUNTER — Encounter: Payer: 59 | Admitting: Physical Therapy

## 2014-08-13 ENCOUNTER — Ambulatory Visit: Payer: 59 | Admitting: Rehabilitation

## 2014-08-13 DIAGNOSIS — S76312D Strain of muscle, fascia and tendon of the posterior muscle group at thigh level, left thigh, subsequent encounter: Secondary | ICD-10-CM

## 2014-08-13 DIAGNOSIS — Z5189 Encounter for other specified aftercare: Secondary | ICD-10-CM | POA: Diagnosis not present

## 2014-08-13 NOTE — Therapy (Signed)
Physical Therapy Treatment  Patient Details  Name: Kara Warren MRN: [id number] Date of Birth: 06/03/1956  Encounter Date: 08/13/2014      PT End of Session - 08/13/14 1506    Visit Number 3   Number of Visits 12   Date for PT Re-Evaluation 09/10/14   PT Start Time 0300   PT Stop Time 0338   PT Time Calculation (min) 38 min      Past Medical History  Diagnosis Date  . Colon polyp   . Menopause   . Migraine   . Endometrial polyp 8/08    benign  . Skene's gland abscess 2009  . Sinusitis, chronic     Past Surgical History  Procedure Laterality Date  . Uterine ablation  2007  . Dilation and curettage of uterus  8/08    There were no vitals taken for this visit.  Visit Diagnosis:  Hamstring strain, left, subsequent encounter      Subjective Assessment - 08/13/14 1504    Currently in Pain? Yes   Pain Score 4    Pain Location --  hamstring, piriformis   Pain Orientation Left   Aggravating Factors  catching self prior to trip/fall            [facility] Adult PT Treatment/Exercise - 08/13/14 1513    High Level Balance   High Level Balance Activities --  fast feet and side stepping with green band per HEP, minimal   Lumbar Exercises: Stretches   Active Hamstring Stretch 3 reps;30 seconds   ITB Stretch 3 reps;30 seconds   Piriformis Stretch 3 reps;30 seconds   Lumbar Exercises: Supine   Bridge 10 reps;Other (comment)   Other Supine Lumbar Exercises --  Bridge with clam 10 clams, Bridge with Knee extensions x10,   Other Supine Lumbar Exercises --  Single leg bridge x 10 each side   Lumbar Exercises: Quadruped   Plank --  push up stabilization with trunk rotation x 5 each, min pain   Modalities   Modalities Iontophoresis   Iontophoresis   Type of Iontophoresis Dexamethasone   Location --  left hamstring   Dose --  4mg /ml action patch   Time --  pt to remove in 6 hours or if bothersaome          PT Education - 08/13/14 1553    Education  provided Yes   Education Details Ionto patch wear time and removal   Person(s) Educated Patient   Methods Explanation;Demonstration   Comprehension Verbalized understanding            PT Long Term Goals - 08/13/14 1510    PT LONG TERM GOAL #1   Title "Demonstrate and verbalize techniques to reduce the risk of re-injury including: lifting, posture, body mechanics.    Status On-going   PT LONG TERM GOAL #2   Title Pt will be independent with advanced HEP LE flexibility and mobility   Status On-going   PT LONG TERM GOAL #3   Title improve LEFS to 15% or better   Status On-going   PT LONG TERM GOAL #4   Title report pain decrease to 2/10 or better in order to return to recreational activities running without exacerbating pain   Status On-going   PT LONG TERM GOAL #5   Title tolerate sitting for one hour without exacerbating pain in L hip/LE   Status Achieved          Plan - 08/13/14 1506    Clinical  Impression Statement Pt reports pain has decreased enough to return to running 3 times for 3 miles each, however this morning she caught her self prior to trip/fall during run and her hamstring/piriformis tigjtened back up.   PT Plan continue stretch, ionto, check pain, may need more appts if pain unresolved.        Problem List Patient Active Problem List   Diagnosis Date Noted  . Left hamstring muscle strain 07/27/2014  . Plantar wart 04/02/2014  . Peroneal tendinitis of right lower extremity 03/12/2014  . Left wrist pain 07/30/2013  . Left hip pain 07/30/2013  . Atypical chest pain 03/05/2013  . Diverticulosis 07/27/2012  . Family history of osteoporosis 12/28/2011  . Right foot pain 08/09/2011  . Sinusitis, chronic   . Colon polyp   . Endometrial polyp   . Menopause   . Skene's gland abscess   . Migraine   . TRANSAMINASES, SERUM, ELEVATED 12/07/2009  . LUNG NODULE 11/19/2009  . Nonspecific (abnormal) findings on radiological and other examination of body  structure 11/19/2009  . [employer] LUNG FIELD 11/19/2009  . RHINITIS 06/21/2009  . Acute sinusitis, unspecified 05/20/2009  . HIP PAIN, RIGHT 04/12/2009  . NEUTROPENIA UNSPECIFIED 10/07/2008  . HYPERLIPIDEMIA 08/25/2008  . DEPRESSION 08/25/2008                                              Dorene Ar, PTA 08/13/2014, 3:54 PM

## 2014-08-18 ENCOUNTER — Encounter: Payer: 59 | Admitting: Physical Therapy

## 2014-08-20 ENCOUNTER — Telehealth: Payer: Self-pay | Admitting: *Deleted

## 2014-08-20 ENCOUNTER — Ambulatory Visit: Payer: 59 | Admitting: Physical Therapy

## 2014-08-20 ENCOUNTER — Encounter: Payer: 59 | Admitting: Physical Therapy

## 2014-08-20 DIAGNOSIS — S76312D Strain of muscle, fascia and tendon of the posterior muscle group at thigh level, left thigh, subsequent encounter: Secondary | ICD-10-CM

## 2014-08-20 DIAGNOSIS — Z5189 Encounter for other specified aftercare: Secondary | ICD-10-CM | POA: Diagnosis not present

## 2014-08-20 NOTE — Telephone Encounter (Signed)
Pt only wanted to schedule 3 appts for now....td

## 2014-08-21 ENCOUNTER — Encounter: Payer: Self-pay | Admitting: Physical Therapy

## 2014-08-21 NOTE — Therapy (Signed)
Physical Therapy Treatment  Patient Details  Name: Kara Warren MRN: [id number] Date of Birth: 05-19-1956  Encounter Date: 08/20/2014      PT End of Session - 08/21/14 0741    Visit Number 4   Number of Visits 12   Date for PT Re-Evaluation 09/10/14   PT Start Time 1330   PT Stop Time 1430   PT Time Calculation (min) 60 min   Activity Tolerance Patient tolerated treatment well  Good relief with e-stim/heat      Past Medical History  Diagnosis Date  . Colon polyp   . Menopause   . Migraine   . Endometrial polyp 8/08    benign  . Skene's gland abscess 2009  . Sinusitis, chronic     Past Surgical History  Procedure Laterality Date  . Uterine ablation  2007  . Dilation and curettage of uterus  8/08    There were no vitals taken for this visit.  Visit Diagnosis:  No diagnosis found.      Subjective Assessment - 08/20/14 1333    Symptoms Left proximal HS region and left piriformis and left lower leg;  worsened with driving to [place];  2-70 min can flare up;  trying to stretch and do exercises;  did Body Bump class today   How long can you sit comfortably? 5-10 min   How long can you stand comfortably? typically OK   Pain Location Buttocks   Pain Orientation Left   Aggravating Factors  sitting            [facility] Adult PT Treatment/Exercise - 08/21/14 0001    Modalities   Modalities Electrical Stimulation;Moist Heat   Moist Heat Therapy   Number Minutes Moist Heat --  15    Moist Heat Location --  Left buttock, HS   Electrical Stimulation   Electrical Stimulation Location --  Left buttock, proximal HS   Electrical Stimulation Parameters --  Hi-Volt 85 V 15 min 4 electrodes, sidelying   Electrical Stimulation Goals Pain   Manual Therapy   Manual Therapy Joint mobilization   Joint Mobilization --  Left hip distraction, inferior, A-P hip mobs;  Prone P-A   Manual Therapy   Myofascial Release --  Left HS and piriformis contract-relax with soft  tissue mob     Mobs grade 3/4 3x 30 sec each;             Plan - 08/21/14 0742    Clinical Impression Statement Patient reports sitting is the most aggravating activity.  Point tender left piriformis and proximal HS indicating myofascial pain however patient felt dry needling was too painful.  Improved mobility following hip joint mobs and soft tissue work although pain slightly increased following.  Relieved with e-stim and moist heat.  Continue 3-4 weeks for further pain reduction interventions.   Rehab Potential Good   PT Next Visit Plan ?resume ionto; manual techniques hip joint mobs and soft tissue mob left piriformis and proximal HS; hi-volt e-stim, moist heat   PT Plan Continue with plan of care        Problem List Patient Active Problem List   Diagnosis Date Noted  . Left hamstring muscle strain 07/27/2014  . Plantar wart 04/02/2014  . Peroneal tendinitis of right lower extremity 03/12/2014  . Left wrist pain 07/30/2013  . Left hip pain 07/30/2013  . Atypical chest pain 03/05/2013  . Diverticulosis 07/27/2012  . Family history of osteoporosis 12/28/2011  . Right foot pain 08/09/2011  .  Sinusitis, chronic   . Colon polyp   . Endometrial polyp   . Menopause   . Skene's gland abscess   . Migraine   . TRANSAMINASES, SERUM, ELEVATED 12/07/2009  . LUNG NODULE 11/19/2009  . Nonspecific (abnormal) findings on radiological and other examination of body structure 11/19/2009  . [place] LUNG FIELD 11/19/2009  . RHINITIS 06/21/2009  . Acute sinusitis, unspecified 05/20/2009  . HIP PAIN, RIGHT 04/12/2009  . NEUTROPENIA UNSPECIFIED 10/07/2008  . HYPERLIPIDEMIA 08/25/2008  . DEPRESSION 08/25/2008                                              Ruben Im C 08/21/2014, 7:51 AM   Ruben Im, PT 08/21/2014 7:52 AM Phone: [phone] Fax: [phone]

## 2014-09-01 ENCOUNTER — Ambulatory Visit: Payer: 59 | Attending: Family Medicine | Admitting: Physical Therapy

## 2014-09-01 DIAGNOSIS — R269 Unspecified abnormalities of gait and mobility: Secondary | ICD-10-CM | POA: Diagnosis not present

## 2014-09-01 DIAGNOSIS — S76312D Strain of muscle, fascia and tendon of the posterior muscle group at thigh level, left thigh, subsequent encounter: Secondary | ICD-10-CM | POA: Diagnosis not present

## 2014-09-01 DIAGNOSIS — Z5189 Encounter for other specified aftercare: Secondary | ICD-10-CM | POA: Diagnosis present

## 2014-09-01 DIAGNOSIS — R293 Abnormal posture: Secondary | ICD-10-CM | POA: Insufficient documentation

## 2014-09-01 DIAGNOSIS — R5381 Other malaise: Secondary | ICD-10-CM | POA: Diagnosis not present

## 2014-09-01 NOTE — Therapy (Addendum)
Outpatient Rehabilitation [facility] [address] [place], [place], [address] Phone: [phone]   Fax:  [phone]  Physical Therapy Treatment/Discharge Summary  Patient Details  Name: Kara Warren MRN: [id number] Date of Birth: Sep 05, 1956  Encounter Date: 09/01/2014      PT End of Session - 09/01/14 1748    Visit Number 5   Number of Visits 12   Date for PT Re-Evaluation 09/10/14   PT Start Time 1500   PT Stop Time 1555   PT Time Calculation (min) 55 min   Activity Tolerance Patient tolerated treatment well      Past Medical History  Diagnosis Date  . Colon polyp   . Menopause   . Migraine   . Endometrial polyp 8/08    benign  . Skene's gland abscess 2009  . Sinusitis, chronic     Past Surgical History  Procedure Laterality Date  . Uterine ablation  2007  . Dilation and curettage of uterus  8/08    There were no vitals taken for this visit.  Visit Diagnosis:  Hamstring strain, left, subsequent encounter      Subjective Assessment - 09/01/14 1501    Symptoms It's starting to get better.  After last time, I've been stretching.  Able to run 35 minutes with minimal difficulty, no pain.  Decreased pain with sitting.  Did body pump class at lunch.   How long can you walk comfortably? Walking is fine.   Pain Score 2    Pain Location Hip   Pain Orientation Left   Aggravating Factors  not as bad with sitting now            [facility] Adult PT Treatment/Exercise - 09/01/14 1743    Exercises   Exercises --  Sidelying clams; hip abduction,single leg standing    Moist Heat Therapy   Number Minutes Moist Heat 15 Minutes   Moist Heat Location --  left buttock   Electrical Stimulation   Electrical Stimulation Location --  Left buttock, left posterior thigh   Electrical Stimulation Action --  Hi-Volt   Electrical Stimulation Parameters --  60 V 2 channels 15 min   Electrical Stimulation Goals Pain   Manual Therapy   Manual Therapy Joint  mobilization;Myofascial release   Joint Mobilization --  Left hip distraction, inferior, A-P in IR, prone P-A grade 3   Myofascial Release --  piriformis and HS   Manual Therapy Joint mobilization   Manual Therapy   Joint Mobilization --  Sidelying neutral gapping and distracting grade 3 5x          PT Education - 09/01/14 1747    Education provided Yes   Education Details glut medius strengthening   Person(s) Educated Patient   Methods Explanation;Demonstration   Comprehension Verbalized understanding;Returned demonstration            PT Long Term Goals - 09/01/14 1753    PT LONG TERM GOAL #1   Title "Demonstrate and verbalize techniques to reduce the risk of re-injury including: lifting, posture, body mechanics.    Time 6   Period Weeks   Status On-going   PT LONG TERM GOAL #2   Title Pt will be independent with advanced HEP LE flexibility and mobility   Time 6   Period Weeks   Status On-going   PT LONG TERM GOAL #3   Title improve LEFS to 15% or better   Time 6   Period Weeks   Status On-going   PT  LONG TERM GOAL #4   Title report pain decrease to 2/10 or better in order to return to recreational activities running without exacerbating pain   Status Achieved   PT LONG TERM GOAL #5   Title tolerate sitting for one hour without exacerbating pain in L hip/LE   Status Achieved          Plan - 09/01/14 1749    Clinical Impression Statement Patient reports she is slowly improving and is back to running 35 minutes and doing her Body Pump class.  Still with myofascial tenderness in left piriformis but patient is unable to tolerate the intensity of dry needling.  Making progress toward goals with left hip joint mobs and lumbar neutral gapping.  Should get long-term benefit from left glut medius strengthening.  Should meet remaining goals in 1-2 visits.     PT Next Visit Plan Recheck LEFS;  Left hip joint mobs, muscle energy;  e-stim/heat; assess response and progress  gluteus medius strengthening in HEP; ?resume ionto if needed          PHYSICAL THERAPY DISCHARGE SUMMARY  Visits from Start of Care: 5  Current functional level related to goals / functional outcomes: The patient cancelled her remaining PT appointments for unknown reason.     Remaining deficits: As above   Education / Equipment: HEP Plan: Patient agrees to discharge.  Patient goals were partially met. Patient is being discharged due to not returning since the last visit.  ?????                          Problem List Patient Active Problem List   Diagnosis Date Noted  . Left hamstring muscle strain 07/27/2014  . Plantar wart 04/02/2014  . Peroneal tendinitis of right lower extremity 03/12/2014  . Left wrist pain 07/30/2013  . Left hip pain 07/30/2013  . Atypical chest pain 03/05/2013  . Diverticulosis 07/27/2012  . Family history of osteoporosis 12/28/2011  . Right foot pain 08/09/2011  . Sinusitis, chronic   . Colon polyp   . Endometrial polyp   . Menopause   . Skene's gland abscess   . Migraine   . TRANSAMINASES, SERUM, ELEVATED 12/07/2009  . LUNG NODULE 11/19/2009  . Nonspecific (abnormal) findings on radiological and other examination of body structure 11/19/2009  . NONSPCIFC ABN FINDING RAD & OTH EXAM LUNG FIELD 11/19/2009  . RHINITIS 06/21/2009  . Acute sinusitis, unspecified 05/20/2009  . HIP PAIN, RIGHT 04/12/2009  . NEUTROPENIA UNSPECIFIED 10/07/2008  . HYPERLIPIDEMIA 08/25/2008  . DEPRESSION 08/25/2008    Simpson, Stacy C 09/01/2014, 5:55 PM  Stacy Simpson, PT 09/01/2014 5:55 PM Phone: [phone] Fax: [phone]   

## 2014-09-01 NOTE — Patient Instructions (Signed)
Sidelying clams, hip abduction and single leg standing for attention to avoid compensatory movements.  10-15 reps each.

## 2014-09-02 ENCOUNTER — Ambulatory Visit: Payer: 59 | Admitting: Family Medicine

## 2014-09-04 ENCOUNTER — Ambulatory Visit: Payer: 59 | Admitting: Family Medicine

## 2014-09-07 ENCOUNTER — Ambulatory Visit: Payer: 59 | Admitting: Rehabilitation

## 2014-09-09 ENCOUNTER — Other Ambulatory Visit: Payer: Self-pay | Admitting: Internal Medicine

## 2014-09-09 NOTE — Telephone Encounter (Signed)
Refill request

## 2014-09-10 ENCOUNTER — Encounter: Payer: 59 | Admitting: Rehabilitation

## 2014-09-17 ENCOUNTER — Encounter: Payer: 59 | Admitting: Physical Therapy

## 2014-11-12 ENCOUNTER — Encounter: Payer: Self-pay | Admitting: Internal Medicine

## 2014-11-12 ENCOUNTER — Ambulatory Visit (INDEPENDENT_AMBULATORY_CARE_PROVIDER_SITE_OTHER): Payer: 59 | Admitting: Internal Medicine

## 2014-11-12 VITALS — BP 123/73 | HR 61 | Temp 98.6°F | Resp 16 | Ht 65.0 in | Wt 143.0 lb

## 2014-11-12 DIAGNOSIS — M940 Chondrocostal junction syndrome [Tietze]: Secondary | ICD-10-CM

## 2014-11-12 DIAGNOSIS — J0100 Acute maxillary sinusitis, unspecified: Secondary | ICD-10-CM

## 2014-11-12 MED ORDER — AMOXICILLIN-POT CLAVULANATE 500-125 MG PO TABS: ORAL_TABLET | ORAL | Status: DC

## 2014-11-12 MED ORDER — IBUPROFEN 800 MG PO TABS: ORAL_TABLET | ORAL | Status: DC

## 2014-11-12 NOTE — Progress Notes (Signed)
Subjective:    Patient ID: Kara Warren, female    DOB: 13-Mar-1956, 59 y.o.   MRN: [id number]  HPI  07/2014 note Assessment & Plan:  Bronchospasm Will give albuterol HHN in office Depomedrol 160 mg in office. Albuterol MDI tid at home   Sinusitis: Will give Rocephin 500 mg today  Nasal congestion Afrin nasal spray for next 3 days.          TODAY  Kara Warren is here for acute visit   2 weeks  Of left sided pleuritic chest pain  Beginning in Left axilla and near sternum.  Feels like a muscle pull when she does body pump  Temp 99 at home   1 week of head and nasal congestion   No cough no sore throat no wheezing  No Known Allergies Past Medical History  Diagnosis Date  . Colon polyp   . Menopause   . Migraine   . Endometrial polyp 8/08    benign  . Skene's gland abscess 2009  . Sinusitis, chronic    Past Surgical History  Procedure Laterality Date  . Uterine ablation  2007  . Dilation and curettage of uterus  8/08   History   Social History  . Marital Status: Single    Spouse Name: N/A  . Number of Children: 0  . Years of Education: N/A   Occupational History  . RN [employer]   Social History Main Topics  . Smoking status: Former Smoker -- 0.10 packs/day for 3 years    Types: Cigarettes    Quit date: 10/02/1978  . Smokeless tobacco: Never Used  . Alcohol Use: 4.2 oz/week    7 Glasses of wine per week     Comment: per week  . Drug Use: No  . Sexual Activity: Yes   Other Topics Concern  . Not on file   Social History Narrative   Family History  Problem Relation Age of Onset  . Breast cancer Paternal Grandmother   . Colon cancer Maternal Grandfather   . Colon polyps Father   . Ulcerative colitis Father   . CAD Father     Stent placement at age 64  . Glaucoma Father   . Hypertension Father   . Colitis Father   . Colon polyps Mother   . Osteoporosis Mother   . Thyroid disease Mother     hypo  . Glaucoma Mother   . Diverticulosis  Mother   . Sudden death Neg Hx   . Hyperlipidemia Neg Hx   . Heart attack Neg Hx   . Diabetes Neg Hx    Patient Active Problem List   Diagnosis Date Noted  . Left hamstring muscle strain 07/27/2014  . Plantar wart 04/02/2014  . Peroneal tendinitis of right lower extremity 03/12/2014  . Left wrist pain 07/30/2013  . Left hip pain 07/30/2013  . Atypical chest pain 03/05/2013  . Diverticulosis 07/27/2012  . Family history of osteoporosis 12/28/2011  . Right foot pain 08/09/2011  . Sinusitis, chronic   . Colon polyp   . Endometrial polyp   . Menopause   . Skene's gland abscess   . Migraine   . TRANSAMINASES, SERUM, ELEVATED 12/07/2009  . LUNG NODULE 11/19/2009  . Nonspecific (abnormal) findings on radiological and other examination of body structure 11/19/2009  . [place] LUNG FIELD 11/19/2009  . RHINITIS 06/21/2009  . Acute sinusitis, unspecified 05/20/2009  . HIP PAIN, RIGHT 04/12/2009  . NEUTROPENIA UNSPECIFIED  10/07/2008  . HYPERLIPIDEMIA 08/25/2008  . DEPRESSION 08/25/2008   Current Outpatient Prescriptions on File Prior to Visit  Medication Sig Dispense Refill  . albuterol (PROVENTIL HFA;VENTOLIN HFA) 108 (90 BASE) MCG/ACT inhaler 2 inhaltions into lungs q8h prn wheezing or shortness of breath 1 Inhaler 0  . calcium-vitamin D (CALCIUM 500+D) 500-200 MG-UNIT per tablet Take 1 tablet by mouth daily.      . citalopram (CELEXA) 20 MG tablet TAKE 1 TABLET BY MOUTH ONCE DAILY 90 tablet 3  . guaiFENesin-codeine 100-10 MG/5ML syrup Take 5 mLs by mouth at bedtime as needed for cough. 120 mL 0  . ipratropium (ATROVENT) 0.06 % nasal spray Place 2 sprays into both nostrils 4 (four) times daily. 15 mL 1  . loratadine (CLARITIN) 10 MG tablet Take 10 mg by mouth as needed.     Marland Kitchen Lysine 500 MG TABS Take 1 tablet by mouth daily.      . Multiple Vitamins-Minerals (MULTI FOR HER 50+) TABS Take 1 tablet by mouth daily.      . pantoprazole (PROTONIX) 40 MG tablet  TAKE 1 TABLET BY MOUTH DAILY. 30 tablet 11  . Probiotic Product (PROBIOTIC PO) Take 1 capsule by mouth daily. Called ultra spectrum     No current facility-administered medications on file prior to visit.      Review of Systems See hPI    Objective:   Physical Exam Physical Exam  Constitutional: She is oriented to person, place, and time. She appears well-developed and well-nourished. She is cooperative.  HENT:  Head: Normocephalic and atraumatic.  Right Ear: A middle ear effusion is present.  Left Ear: A middle ear effusion is present.  Nose: Mucosal edema present. Right sinus exhibits maxillary sinus tenderness. Left sinus exhibits maxillary sinus tenderness.  Mouth/Throat: Mild  Posterior oropharyngeal erythema present.  Serous effusion bilaterally  Eyes: Conjunctivae and EOM are normal. Pupils are equal, round, and reactive to light.  Neck: Neck supple. Carotid bruit is not present. No mass present.  Cardiovascular: Regular rhythm, normal heart sounds, intact distal pulses and normal pulses. Exam reveals no gallop and no friction rub.  No murmur heard.  Pulmonary/Chest: Breath sounds normal. She has no wheezes. She has no rhonchi. She has no rales.  Point tenderness along L lateral ribs and left side of sternum Neurological: She is alert and oriented to person, place, and time.  Skin: Skin is warm and dry. No abrasion, no bruising, no ecchymosis and no rash noted. No cyanosis. Nails show no clubbing.  Psychiatric: She has a normal mood and affect. Her speech is normal and behavior is normal.       Assessment & Plan:  Viral URI  OTC .  Likely viral  Given Augmentin 500 but advised not to take unless worsening over next 3-4 days   Pleuritic chest pain  Likely costcochondritis   Ibuprofen 800 mb bid for 7 days  See me if not better

## 2014-11-19 ENCOUNTER — Telehealth: Payer: Self-pay | Admitting: *Deleted

## 2014-11-19 MED ORDER — AZITHROMYCIN 250 MG PO TABS: ORAL_TABLET | ORAL | Status: DC

## 2014-11-19 NOTE — Telephone Encounter (Signed)
Kara Warren called and said the Amox. is not working for her at all. She is asking that we change it to a z-pak.

## 2014-11-19 NOTE — Telephone Encounter (Signed)
Left a VM with Tamzin letting her know the z-pak has been sent in -eh

## 2015-02-16 ENCOUNTER — Other Ambulatory Visit (HOSPITAL_BASED_OUTPATIENT_CLINIC_OR_DEPARTMENT_OTHER): Payer: Self-pay | Admitting: Family Medicine

## 2015-02-16 DIAGNOSIS — Z1231 Encounter for screening mammogram for malignant neoplasm of breast: Secondary | ICD-10-CM

## 2015-03-02 ENCOUNTER — Other Ambulatory Visit: Payer: Self-pay | Admitting: Dermatology

## 2015-03-05 ENCOUNTER — Ambulatory Visit (HOSPITAL_BASED_OUTPATIENT_CLINIC_OR_DEPARTMENT_OTHER): Payer: 59

## 2015-03-12 ENCOUNTER — Ambulatory Visit (HOSPITAL_BASED_OUTPATIENT_CLINIC_OR_DEPARTMENT_OTHER): Payer: 59

## 2015-03-15 ENCOUNTER — Ambulatory Visit (HOSPITAL_BASED_OUTPATIENT_CLINIC_OR_DEPARTMENT_OTHER)
Admission: RE | Admit: 2015-03-15 | Discharge: 2015-03-15 | Disposition: A | Discharge status: Home / Self Care | Payer: 59 | Source: Ambulatory Visit | Attending: Family Medicine | Admitting: Family Medicine

## 2015-03-15 DIAGNOSIS — Z1231 Encounter for screening mammogram for malignant neoplasm of breast: Secondary | ICD-10-CM | POA: Insufficient documentation

## 2015-10-12 MED FILL — XIFAXAN 550 MG TABLET: 550 | 14 days supply | Qty: 42 | Fill #1

## 2015-11-08 MED FILL — XIFAXAN 550 MG TABLET: 550 | 14 days supply | Qty: 42 | Fill #2

## 2015-11-22 MED FILL — CITALOPRAM HBR 20 MG TABLET: 20 | 90 days supply | Qty: 45 | Fill #1

## 2015-12-28 MED FILL — XIFAXAN 550 MG TABLET: 550 | 14 days supply | Qty: 42 | Fill #3

## 2016-01-17 DIAGNOSIS — H5213 Myopia, bilateral: Secondary | ICD-10-CM | POA: Diagnosis not present

## 2016-02-08 DIAGNOSIS — K589 Irritable bowel syndrome without diarrhea: Secondary | ICD-10-CM | POA: Diagnosis not present

## 2016-02-08 DIAGNOSIS — F419 Anxiety disorder, unspecified: Secondary | ICD-10-CM | POA: Diagnosis not present

## 2016-02-08 DIAGNOSIS — J309 Allergic rhinitis, unspecified: Secondary | ICD-10-CM | POA: Diagnosis not present

## 2016-02-08 DIAGNOSIS — R070 Pain in throat: Secondary | ICD-10-CM | POA: Diagnosis not present

## 2016-02-08 MED FILL — MONTELUKAST SOD 10 MG TAB: 10 | 90 days supply | Qty: 90 | Fill #0

## 2016-02-08 MED FILL — AZELASTINE HCL 137 MCG SPRY: 0.1 | 25 days supply | Qty: 30 | Fill #0

## 2016-02-11 ENCOUNTER — Other Ambulatory Visit (HOSPITAL_BASED_OUTPATIENT_CLINIC_OR_DEPARTMENT_OTHER): Payer: Self-pay | Admitting: Family Medicine

## 2016-02-11 DIAGNOSIS — Z1231 Encounter for screening mammogram for malignant neoplasm of breast: Secondary | ICD-10-CM

## 2016-03-10 MED FILL — CITALOPRAM HBR 20 MG TABLET: 20 | 90 days supply | Qty: 45 | Fill #2

## 2016-03-21 ENCOUNTER — Ambulatory Visit (HOSPITAL_BASED_OUTPATIENT_CLINIC_OR_DEPARTMENT_OTHER): Payer: 59

## 2016-03-30 ENCOUNTER — Ambulatory Visit (HOSPITAL_BASED_OUTPATIENT_CLINIC_OR_DEPARTMENT_OTHER)
Admission: RE | Admit: 2016-03-30 | Discharge: 2016-03-30 | Disposition: A | Discharge status: Home / Self Care | Payer: 59 | Source: Ambulatory Visit | Attending: Family Medicine | Admitting: Family Medicine

## 2016-03-30 DIAGNOSIS — Z1231 Encounter for screening mammogram for malignant neoplasm of breast: Secondary | ICD-10-CM | POA: Diagnosis not present

## 2016-05-22 MED FILL — CITALOPRAM HBR 20 MG TABLET: 20 | 90 days supply | Qty: 45 | Fill #3

## 2016-06-28 DIAGNOSIS — R05 Cough: Secondary | ICD-10-CM | POA: Diagnosis not present

## 2016-06-28 DIAGNOSIS — M549 Dorsalgia, unspecified: Secondary | ICD-10-CM | POA: Diagnosis not present

## 2016-06-28 DIAGNOSIS — R109 Unspecified abdominal pain: Secondary | ICD-10-CM | POA: Diagnosis not present

## 2016-06-28 DIAGNOSIS — N390 Urinary tract infection, site not specified: Secondary | ICD-10-CM | POA: Diagnosis not present

## 2016-06-28 DIAGNOSIS — J309 Allergic rhinitis, unspecified: Secondary | ICD-10-CM | POA: Diagnosis not present

## 2016-06-28 DIAGNOSIS — J329 Chronic sinusitis, unspecified: Secondary | ICD-10-CM | POA: Diagnosis not present

## 2016-06-28 MED FILL — CIPROFLOXACIN HCL 500 MG TA: 500 | 3 days supply | Qty: 6 | Fill #0

## 2016-06-29 MED FILL — AZELASTINE HCL 137 MCG SPRY: 0.1 | 25 days supply | Qty: 30 | Fill #1

## 2016-06-29 MED FILL — MONTELUKAST SOD 10 MG TAB: 10 | 90 days supply | Qty: 90 | Fill #0

## 2016-07-04 DIAGNOSIS — J329 Chronic sinusitis, unspecified: Secondary | ICD-10-CM | POA: Diagnosis not present

## 2016-07-04 DIAGNOSIS — N390 Urinary tract infection, site not specified: Secondary | ICD-10-CM | POA: Diagnosis not present

## 2016-07-04 DIAGNOSIS — J309 Allergic rhinitis, unspecified: Secondary | ICD-10-CM | POA: Diagnosis not present

## 2016-07-04 MED FILL — CIPROFLOXACIN HCL 500 MG TA: 500 | 10 days supply | Qty: 20 | Fill #0

## 2016-07-13 ENCOUNTER — Encounter: Payer: Self-pay | Admitting: Gastroenterology

## 2016-07-14 DIAGNOSIS — J329 Chronic sinusitis, unspecified: Secondary | ICD-10-CM | POA: Diagnosis not present

## 2016-07-14 DIAGNOSIS — N390 Urinary tract infection, site not specified: Secondary | ICD-10-CM | POA: Diagnosis not present

## 2016-07-14 DIAGNOSIS — J309 Allergic rhinitis, unspecified: Secondary | ICD-10-CM | POA: Diagnosis not present

## 2016-07-14 DIAGNOSIS — R05 Cough: Secondary | ICD-10-CM | POA: Diagnosis not present

## 2016-07-14 MED FILL — AZITHROMYCIN 250 MG TABLET: 250 | 5 days supply | Qty: 6 | Fill #0

## 2016-07-26 DIAGNOSIS — D225 Melanocytic nevi of trunk: Secondary | ICD-10-CM | POA: Diagnosis not present

## 2016-07-26 DIAGNOSIS — L821 Other seborrheic keratosis: Secondary | ICD-10-CM | POA: Diagnosis not present

## 2016-07-26 DIAGNOSIS — D1801 Hemangioma of skin and subcutaneous tissue: Secondary | ICD-10-CM | POA: Diagnosis not present

## 2016-07-26 DIAGNOSIS — L814 Other melanin hyperpigmentation: Secondary | ICD-10-CM | POA: Diagnosis not present

## 2016-08-17 MED FILL — CITALOPRAM HBR 20 MG TABLET: 20 | 90 days supply | Qty: 45 | Fill #4

## 2016-08-30 DIAGNOSIS — Z6824 Body mass index (BMI) 24.0-24.9, adult: Secondary | ICD-10-CM | POA: Diagnosis not present

## 2016-08-30 DIAGNOSIS — R05 Cough: Secondary | ICD-10-CM | POA: Diagnosis not present

## 2016-08-30 DIAGNOSIS — R5383 Other fatigue: Secondary | ICD-10-CM | POA: Diagnosis not present

## 2016-08-30 MED FILL — FLUTICASONE PROP 50 MCG SPR: 50 | 30 days supply | Qty: 16 | Fill #0

## 2016-08-30 MED FILL — BENZONATATE 200 MG CAPSULE: 200 | 15 days supply | Qty: 45 | Fill #0

## 2016-09-08 MED FILL — AMOX-CLAV 875-125 MG TABLET: 875-125 | 10 days supply | Qty: 20 | Fill #0

## 2016-09-11 MED FILL — MONTELUKAST SOD 10 MG TAB: 10 | 90 days supply | Qty: 90 | Fill #1

## 2016-10-10 DIAGNOSIS — Z0000 Encounter for general adult medical examination without abnormal findings: Secondary | ICD-10-CM | POA: Diagnosis not present

## 2016-10-12 DIAGNOSIS — Z0000 Encounter for general adult medical examination without abnormal findings: Secondary | ICD-10-CM | POA: Diagnosis not present

## 2016-10-12 DIAGNOSIS — Z124 Encounter for screening for malignant neoplasm of cervix: Secondary | ICD-10-CM | POA: Diagnosis not present

## 2016-10-12 DIAGNOSIS — Z1211 Encounter for screening for malignant neoplasm of colon: Secondary | ICD-10-CM | POA: Diagnosis not present

## 2016-10-12 DIAGNOSIS — Z01419 Encounter for gynecological examination (general) (routine) without abnormal findings: Secondary | ICD-10-CM | POA: Diagnosis not present

## 2016-10-12 DIAGNOSIS — Z6824 Body mass index (BMI) 24.0-24.9, adult: Secondary | ICD-10-CM | POA: Diagnosis not present

## 2016-10-12 MED FILL — OMEPRAZOLE DR 40 MG CAPSULE: 40 | 90 days supply | Qty: 90 | Fill #0

## 2016-10-17 ENCOUNTER — Other Ambulatory Visit: Payer: Self-pay | Admitting: Gastroenterology

## 2016-10-17 DIAGNOSIS — K219 Gastro-esophageal reflux disease without esophagitis: Secondary | ICD-10-CM | POA: Diagnosis not present

## 2016-10-17 DIAGNOSIS — K580 Irritable bowel syndrome with diarrhea: Secondary | ICD-10-CM | POA: Diagnosis not present

## 2016-10-17 DIAGNOSIS — R140 Abdominal distension (gaseous): Secondary | ICD-10-CM

## 2016-11-01 ENCOUNTER — Other Ambulatory Visit: Payer: 59

## 2016-11-06 ENCOUNTER — Other Ambulatory Visit: Payer: 59

## 2016-11-10 ENCOUNTER — Ambulatory Visit
Admission: RE | Admit: 2016-11-10 | Discharge: 2016-11-10 | Disposition: A | Discharge status: Home / Self Care | Payer: 59 | Source: Ambulatory Visit | Attending: Gastroenterology | Admitting: Gastroenterology

## 2016-11-10 DIAGNOSIS — R140 Abdominal distension (gaseous): Secondary | ICD-10-CM | POA: Diagnosis not present

## 2016-11-14 DIAGNOSIS — Z6824 Body mass index (BMI) 24.0-24.9, adult: Secondary | ICD-10-CM | POA: Diagnosis not present

## 2016-11-14 DIAGNOSIS — K589 Irritable bowel syndrome without diarrhea: Secondary | ICD-10-CM | POA: Diagnosis not present

## 2016-11-14 DIAGNOSIS — F411 Generalized anxiety disorder: Secondary | ICD-10-CM | POA: Diagnosis not present

## 2016-11-14 DIAGNOSIS — K219 Gastro-esophageal reflux disease without esophagitis: Secondary | ICD-10-CM | POA: Diagnosis not present

## 2016-11-14 MED FILL — XIFAXAN 550 MG TABLET: 550 | 14 days supply | Qty: 42 | Fill #0

## 2016-11-14 MED FILL — CITALOPRAM HBR 20 MG TABLET: 20 | 90 days supply | Qty: 90 | Fill #0

## 2016-11-15 ENCOUNTER — Other Ambulatory Visit: Payer: Self-pay | Admitting: Family Medicine

## 2016-11-15 DIAGNOSIS — R935 Abnormal findings on diagnostic imaging of other abdominal regions, including retroperitoneum: Secondary | ICD-10-CM

## 2016-12-08 ENCOUNTER — Encounter: Payer: Self-pay | Admitting: Obstetrics & Gynecology

## 2016-12-08 ENCOUNTER — Ambulatory Visit (INDEPENDENT_AMBULATORY_CARE_PROVIDER_SITE_OTHER): Payer: 59 | Admitting: Obstetrics & Gynecology

## 2016-12-08 VITALS — BP 124/70 | HR 68 | Resp 16 | Ht 64.5 in | Wt 145.0 lb

## 2016-12-08 DIAGNOSIS — R935 Abnormal findings on diagnostic imaging of other abdominal regions, including retroperitoneum: Secondary | ICD-10-CM | POA: Diagnosis not present

## 2016-12-08 DIAGNOSIS — N882 Stricture and stenosis of cervix uteri: Secondary | ICD-10-CM | POA: Diagnosis not present

## 2016-12-08 DIAGNOSIS — K589 Irritable bowel syndrome without diarrhea: Secondary | ICD-10-CM

## 2016-12-08 DIAGNOSIS — R140 Abdominal distension (gaseous): Secondary | ICD-10-CM | POA: Diagnosis not present

## 2016-12-08 NOTE — Progress Notes (Addendum)
61 y.o. G0P0000 SingleCaucasianF here for new patient exam and in referral from Dr. Collene Mares.  Pt has hx of IBS and reports increased stressors due to family issues which she thinks has really caused her IBS to worsen recently.  She was evaluated by Dr. Collene Mares and with this evaluation a pelvic ultrasound was obtained.  Pt denies vaginal bleeding or discharge.  However, her endometrium was 80mm and irregular.  Dr. Collene Mares asked my opinion and I felt an endometrial biopsy was prudent.  She is here for this today.  Pt has no other complaints beyond GI issues.  She did have a pap smear 2/18 with Dr. Ernie Hew.  She reports this was normal but is unsure if HR HPV testing was done.  She does not recall ever being informed of this.  Does have intermittent abdominal and pelvic pain but this is typically when she is having more bloating and gas issues so has attributed it to this cause.  Pt does have hx of endometrial ablation during years when she cycled.  Ablation helped her bleeding but it did not stop until she went into menopause.  PCP:  Dr. Ernie Hew.  Pap smear done 2/18 and was normal.    Patient's last menstrual period was 10/02/2006 (approximate).          Sexually active: No.  The current method of family planning is post menopausal status.    Exercising: Yes.    run, walking, spin, body pumping, pilates, yoga Smoker:  Former   Health Maintenance: Pap:  11/2016 normal  History of abnormal Pap:  yes MMG:  03/30/16 BIRADS1:neg  Colonoscopy: ~4 years ago with Dr. Collene Mares - f/u 10 years.  BMD:   11/18/09  TDaP: 09/2012  Pneumonia vaccine(s):  Done Zostavax:   12/2010 Hep C testing: Unsure Screening Labs: PCP   reports that she quit smoking about 38 years ago. Her smoking use included Cigarettes. She has a 0.30 pack-year smoking history. She has never used smokeless tobacco. She reports that she drinks about 4.2 oz of alcohol per week . She reports that she does not use drugs.  Past Medical History:  Diagnosis Date   . Colon polyp   . Endometrial polyp 8/08   benign  . IBS (irritable bowel syndrome)   . Menopause   . Migraine   . Sinusitis, chronic   . Skene's gland abscess 2009    Past Surgical History:  Procedure Laterality Date  . DILATION AND CURETTAGE OF UTERUS  8/08  . GYNECOLOGIC CRYOSURGERY  1980  . uterine ablation  2007    Current Outpatient Prescriptions  Medication Sig Dispense Refill  . calcium-vitamin D (CALCIUM 500+D) 500-200 MG-UNIT per tablet Take 1 tablet by mouth daily.      . citalopram (CELEXA) 20 MG tablet TAKE 1 TABLET BY MOUTH ONCE DAILY 90 tablet 3  . montelukast (SINGULAIR) 10 MG tablet Take 1 tablet by mouth daily.  3  . Multiple Vitamins-Minerals (MULTI FOR HER 50+) TABS Take 1 tablet by mouth daily.      Marland Kitchen omeprazole (PRILOSEC) 40 MG capsule daily.  1  . Probiotic Product (PROBIOTIC PO) Take 1 capsule by mouth daily. Called ultra spectrum    . ipratropium (ATROVENT) 0.06 % nasal spray Place 2 sprays into both nostrils 4 (four) times daily. (Patient not taking: Reported on 12/08/2016) 15 mL 1   No current facility-administered medications for this visit.     Family History  Problem Relation Age of Onset  . Colon  polyps Father   . Ulcerative colitis Father   . CAD Father     Stent placement at age 6  . Glaucoma Father   . Hypertension Father   . Colitis Father   . Breast cancer Paternal Grandmother   . Colon cancer Maternal Grandfather   . Colon polyps Mother   . Osteoporosis Mother   . Thyroid disease Mother     hypo  . Glaucoma Mother   . Diverticulosis Mother   . Sudden death Neg Hx   . Hyperlipidemia Neg Hx   . Heart attack Neg Hx   . Diabetes Neg Hx     ROS:  Pertinent items are noted in HPI.  Otherwise, a comprehensive ROS was negative.  Exam:   BP 124/70 (BP Location: Right Arm, Patient Position: Sitting, Cuff Size: Normal)   Pulse 68   Resp 16   Ht 5' 4.5" (1.638 m)   Wt 145 lb (65.8 kg)   LMP 10/02/2006 (Approximate)   BMI 24.50  kg/m    Height: 5' 4.5" (163.8 cm)  Ht Readings from Last 3 Encounters:  12/08/16 5' 4.5" (1.638 m)  11/12/14 5\' 5"  (1.651 m)  07/22/14 5\' 5"  (1.651 m)   General appearance: alert, cooperative and appears stated age Abdomen: soft, non-tender; bowel sounds normal; no masses,  no organomegaly Extremities: extremities normal, atraumatic, no cyanosis or edema Skin: Skin color, texture, turgor normal. No rashes or lesions Lymph nodes: Cervical, supraclavicular, and axillary nodes normal. No abnormal inguinal nodes palpated Neurologic: Grossly normal  Pelvic: External genitalia:  no lesions              Urethra:  normal appearing urethra with no masses, tenderness or lesions              Bartholins and Skenes: normal                 Vagina: normal appearing vagina with normal color and discharge, no lesions              Cervix: no lesions but stenotic os noted              Pap taken: yes, to hold until outside records come Bimanual Exam:  Uterus:  normal size, contour, position, consistency, mobility, non-tender              Adnexa: normal adnexa and no mass, fullness, tenderness               Rectovaginal: Confirms               Anus:  normal sphincter tone, no lesions  Endometrial biopsy recommended.  Discussed with patient.  Verbal and written consent obtained.   Procedure:  Speculum placed.  Cervix visualized and cleansed with betadine prep.  A single toothed tenaculum was applied to the anterior lip of the cervix.  Several attempts made to dilate cervix due to stenotic os.  After multiple attempts and as pt become more uncomfortable, felt procedure should be ended.  Biopsy was not obtained.  Tenculum removed.  Minimal bleeding noted.  Patient tolerated procedure well.  Chaperone was present for exam.  A:  62mm, irregular endometrium H/O IBS with bloating, normal ovaries on ultrasound H/O endometrial polyp and endometrial ablation Stenotic cervical os  P:   Attempted endometrial  biopsy today.  Procedure stopped due to pt discomfort and stenotic cervical os D/w pt monitoring endometrium by ultrasound.  If images show change or vascularity, will proceed with  evaluation in OR.  If stable and no bleeding, will watch for the next year, every 4 months.  Pt comfortable with plan.  Will communicate this with Dr. Collene Mares as well. Will obtain outside records regarding pap and see if HR HPV has been done as well.  Pap will be held until that time. Pt will check with Dr. Ernie Hew about whether Hep C testing has been completed.

## 2016-12-08 NOTE — Patient Instructions (Signed)
Just check about having Hep C testing with Dr. Ernie Hew.

## 2016-12-14 ENCOUNTER — Telehealth: Payer: Self-pay | Admitting: *Deleted

## 2016-12-14 NOTE — Telephone Encounter (Signed)
Left message per DPR letting patient know that Dr. Sabra Heck received her records and pap smear was negative and HR HPV testing was negative also. Advised patient Dr. Sabra Heck would not be sending pap smear from office visit on 12/08/16 for evaluation. Instructed patient to return call if she had any questions.   Routing to provider for final review. Patient agreeable to disposition. Will close encounter.

## 2016-12-14 NOTE — Telephone Encounter (Signed)
Patient calling to ask for an update about ultrasound scheduling. Patient states when she was here for her appointment on 12/08/16, Dr. Sabra Heck stated she was going to discuss ultrasound scheduling with Dr. Collene Mares. Patient calling to see if she needs to keep both the April appointment at [facility], and June appointment in our office, or if she needs to cancel one. RN advised this message would be sent to Dr. Sabra Heck for review and our office would return call with additional recommendations.   Routing to provider for review.

## 2016-12-15 NOTE — Telephone Encounter (Signed)
She should cancel April appt and keep appt here for follow up in four months.  I did talk with Dr. Collene Mares and she was comfortable with this plan.  Thanks.

## 2016-12-15 NOTE — Telephone Encounter (Signed)
Call to patient. Message given to patient as seen below from Dr. Sabra Heck. Patient verbalized understanding.   Routing to provider for final review. Patient agreeable to disposition. Will close encounter.

## 2016-12-25 DIAGNOSIS — R5383 Other fatigue: Secondary | ICD-10-CM | POA: Diagnosis not present

## 2016-12-27 DIAGNOSIS — K219 Gastro-esophageal reflux disease without esophagitis: Secondary | ICD-10-CM | POA: Diagnosis not present

## 2016-12-27 DIAGNOSIS — Z6824 Body mass index (BMI) 24.0-24.9, adult: Secondary | ICD-10-CM | POA: Diagnosis not present

## 2016-12-27 DIAGNOSIS — K589 Irritable bowel syndrome without diarrhea: Secondary | ICD-10-CM | POA: Diagnosis not present

## 2016-12-27 DIAGNOSIS — E559 Vitamin D deficiency, unspecified: Secondary | ICD-10-CM | POA: Diagnosis not present

## 2016-12-27 MED FILL — MONTELUKAST SOD 10 MG TAB: 10 | 90 days supply | Qty: 90 | Fill #2

## 2017-01-02 ENCOUNTER — Other Ambulatory Visit: Payer: 59

## 2017-01-10 MED FILL — OMEPRAZOLE DR 40 MG CAPSULE: 40 | 90 days supply | Qty: 90 | Fill #1

## 2017-01-17 DIAGNOSIS — H5213 Myopia, bilateral: Secondary | ICD-10-CM | POA: Diagnosis not present

## 2017-01-17 DIAGNOSIS — H25043 Posterior subcapsular polar age-related cataract, bilateral: Secondary | ICD-10-CM | POA: Diagnosis not present

## 2017-01-17 DIAGNOSIS — H25013 Cortical age-related cataract, bilateral: Secondary | ICD-10-CM | POA: Diagnosis not present

## 2017-01-23 DIAGNOSIS — K219 Gastro-esophageal reflux disease without esophagitis: Secondary | ICD-10-CM | POA: Diagnosis not present

## 2017-01-23 DIAGNOSIS — Z800 Family history of malignant neoplasm of digestive organs: Secondary | ICD-10-CM | POA: Diagnosis not present

## 2017-01-23 DIAGNOSIS — K580 Irritable bowel syndrome with diarrhea: Secondary | ICD-10-CM | POA: Diagnosis not present

## 2017-01-23 DIAGNOSIS — R1012 Left upper quadrant pain: Secondary | ICD-10-CM | POA: Diagnosis not present

## 2017-01-23 MED FILL — raNITIdine HCL 300 MG TABS: 300 | 30 days supply | Qty: 30 | Fill #0

## 2017-02-09 ENCOUNTER — Other Ambulatory Visit: Payer: Self-pay | Admitting: Family Medicine

## 2017-02-09 ENCOUNTER — Encounter: Payer: Self-pay | Admitting: Physician Assistant

## 2017-02-09 ENCOUNTER — Ambulatory Visit
Admission: RE | Admit: 2017-02-09 | Discharge: 2017-02-09 | Disposition: A | Discharge status: Home / Self Care | Payer: 59 | Source: Ambulatory Visit | Attending: Family Medicine | Admitting: Family Medicine

## 2017-02-09 DIAGNOSIS — J984 Other disorders of lung: Secondary | ICD-10-CM | POA: Diagnosis not present

## 2017-02-09 DIAGNOSIS — R197 Diarrhea, unspecified: Secondary | ICD-10-CM | POA: Diagnosis not present

## 2017-02-09 DIAGNOSIS — R0781 Pleurodynia: Secondary | ICD-10-CM

## 2017-02-09 DIAGNOSIS — J309 Allergic rhinitis, unspecified: Secondary | ICD-10-CM | POA: Diagnosis not present

## 2017-02-09 DIAGNOSIS — M940 Chondrocostal junction syndrome [Tietze]: Secondary | ICD-10-CM | POA: Diagnosis not present

## 2017-02-09 DIAGNOSIS — K589 Irritable bowel syndrome without diarrhea: Secondary | ICD-10-CM | POA: Diagnosis not present

## 2017-02-09 DIAGNOSIS — K9049 Malabsorption due to intolerance, not elsewhere classified: Secondary | ICD-10-CM | POA: Diagnosis not present

## 2017-02-12 MED FILL — CITALOPRAM HBR 20 MG TABLET: 20 | 90 days supply | Qty: 90 | Fill #1

## 2017-02-15 DIAGNOSIS — R197 Diarrhea, unspecified: Secondary | ICD-10-CM | POA: Diagnosis not present

## 2017-02-15 DIAGNOSIS — R1012 Left upper quadrant pain: Secondary | ICD-10-CM | POA: Diagnosis not present

## 2017-02-15 DIAGNOSIS — J309 Allergic rhinitis, unspecified: Secondary | ICD-10-CM | POA: Diagnosis not present

## 2017-02-15 DIAGNOSIS — F411 Generalized anxiety disorder: Secondary | ICD-10-CM | POA: Diagnosis not present

## 2017-02-20 ENCOUNTER — Other Ambulatory Visit: Payer: Self-pay | Admitting: Gastroenterology

## 2017-02-20 ENCOUNTER — Ambulatory Visit: Payer: 59 | Admitting: Physician Assistant

## 2017-02-20 DIAGNOSIS — K580 Irritable bowel syndrome with diarrhea: Secondary | ICD-10-CM | POA: Diagnosis not present

## 2017-02-20 DIAGNOSIS — R1012 Left upper quadrant pain: Secondary | ICD-10-CM | POA: Diagnosis not present

## 2017-02-20 DIAGNOSIS — R1032 Left lower quadrant pain: Secondary | ICD-10-CM | POA: Diagnosis not present

## 2017-02-20 DIAGNOSIS — Z800 Family history of malignant neoplasm of digestive organs: Secondary | ICD-10-CM | POA: Diagnosis not present

## 2017-02-20 DIAGNOSIS — K219 Gastro-esophageal reflux disease without esophagitis: Secondary | ICD-10-CM | POA: Diagnosis not present

## 2017-02-21 ENCOUNTER — Encounter (HOSPITAL_COMMUNITY): Payer: Self-pay

## 2017-02-21 ENCOUNTER — Ambulatory Visit (HOSPITAL_COMMUNITY)
Admission: RE | Admit: 2017-02-21 | Discharge: 2017-02-21 | Disposition: A | Discharge status: Home / Self Care | Payer: 59 | Source: Ambulatory Visit | Attending: Gastroenterology | Admitting: Gastroenterology

## 2017-02-21 DIAGNOSIS — R1032 Left lower quadrant pain: Secondary | ICD-10-CM | POA: Diagnosis not present

## 2017-02-21 DIAGNOSIS — R1012 Left upper quadrant pain: Secondary | ICD-10-CM | POA: Diagnosis not present

## 2017-02-21 LAB — POCT I-STAT CREATININE: Creatinine, Ser: 0.8 mg/dL (ref 0.44–1.00)

## 2017-02-21 MED ORDER — IOPAMIDOL (ISOVUE-300) INJECTION 61%
INTRAVENOUS | Status: AC
  Filled 2017-02-21: qty 100

## 2017-02-21 MED ORDER — IOPAMIDOL (ISOVUE-300) INJECTION 61%
100.0000 mL | Freq: Once | INTRAVENOUS | Status: AC | PRN
  Administered 2017-02-21: 100 mL via INTRAVENOUS

## 2017-02-21 MED FILL — predniSONE 10 MG TABS: 10 | 25 days supply | Qty: 60 | Fill #0

## 2017-03-05 ENCOUNTER — Telehealth: Payer: Self-pay | Admitting: Obstetrics & Gynecology

## 2017-03-05 NOTE — Telephone Encounter (Signed)
Routing to provider for final review. Patient agreeable to disposition. Will close encounter.     

## 2017-03-05 NOTE — Telephone Encounter (Signed)
Spoke with patient in regards to ultrasound appointment on 03/06/17. Patient is agreeable to rescheduling appointment. Patient had been rescheduled for 03/15/17 with Dr Sabra Heck. Patient is aware of date, time and cancellation policy. Patient did not have any questions.   Routing to Dr Sabra Heck  cc: Lamont Snowball

## 2017-03-06 ENCOUNTER — Other Ambulatory Visit: Payer: 59 | Admitting: Obstetrics & Gynecology

## 2017-03-06 ENCOUNTER — Other Ambulatory Visit: Payer: 59

## 2017-03-07 DIAGNOSIS — R1012 Left upper quadrant pain: Secondary | ICD-10-CM | POA: Diagnosis not present

## 2017-03-07 DIAGNOSIS — K219 Gastro-esophageal reflux disease without esophagitis: Secondary | ICD-10-CM | POA: Diagnosis not present

## 2017-03-07 DIAGNOSIS — K2970 Gastritis, unspecified, without bleeding: Secondary | ICD-10-CM | POA: Diagnosis not present

## 2017-03-07 DIAGNOSIS — K449 Diaphragmatic hernia without obstruction or gangrene: Secondary | ICD-10-CM | POA: Diagnosis not present

## 2017-03-07 DIAGNOSIS — K3189 Other diseases of stomach and duodenum: Secondary | ICD-10-CM | POA: Diagnosis not present

## 2017-03-07 DIAGNOSIS — K317 Polyp of stomach and duodenum: Secondary | ICD-10-CM | POA: Diagnosis not present

## 2017-03-08 ENCOUNTER — Other Ambulatory Visit (HOSPITAL_BASED_OUTPATIENT_CLINIC_OR_DEPARTMENT_OTHER): Payer: Self-pay | Admitting: Family Medicine

## 2017-03-08 DIAGNOSIS — Z1231 Encounter for screening mammogram for malignant neoplasm of breast: Secondary | ICD-10-CM

## 2017-03-14 ENCOUNTER — Other Ambulatory Visit: Payer: Self-pay | Admitting: *Deleted

## 2017-03-14 DIAGNOSIS — R935 Abnormal findings on diagnostic imaging of other abdominal regions, including retroperitoneum: Secondary | ICD-10-CM

## 2017-03-15 ENCOUNTER — Ambulatory Visit (INDEPENDENT_AMBULATORY_CARE_PROVIDER_SITE_OTHER): Payer: 59 | Admitting: Obstetrics & Gynecology

## 2017-03-15 ENCOUNTER — Ambulatory Visit (INDEPENDENT_AMBULATORY_CARE_PROVIDER_SITE_OTHER): Payer: 59

## 2017-03-15 VITALS — BP 130/90 | HR 60 | Resp 16 | Ht 64.5 in | Wt 145.0 lb

## 2017-03-15 DIAGNOSIS — N882 Stricture and stenosis of cervix uteri: Secondary | ICD-10-CM | POA: Diagnosis not present

## 2017-03-15 DIAGNOSIS — N949 Unspecified condition associated with female genital organs and menstrual cycle: Secondary | ICD-10-CM | POA: Diagnosis not present

## 2017-03-15 DIAGNOSIS — R140 Abdominal distension (gaseous): Secondary | ICD-10-CM

## 2017-03-15 DIAGNOSIS — R935 Abnormal findings on diagnostic imaging of other abdominal regions, including retroperitoneum: Secondary | ICD-10-CM | POA: Diagnosis not present

## 2017-03-15 DIAGNOSIS — R9389 Abnormal findings on diagnostic imaging of other specified body structures: Secondary | ICD-10-CM

## 2017-03-15 DIAGNOSIS — N859 Noninflammatory disorder of uterus, unspecified: Secondary | ICD-10-CM

## 2017-03-15 DIAGNOSIS — R938 Abnormal findings on diagnostic imaging of other specified body structures: Secondary | ICD-10-CM

## 2017-03-15 NOTE — Progress Notes (Signed)
61 y.o. G0 Single Caucasian female here for a pelvic ultrasound with possible sonohystogram due to irregular appearing endometrium noted on ultrasound.  Ultrasound was ordered as pt was experiencing more issues with bloating and IBS.  She reports significant stressors and felt this was contributing.  Has not had vaginal bleeding.  Patient's last menstrual period was 10/02/2006 (approximate).  Contraception:  PMP  Technique:  Both transabdominal and transvaginal ultrasound examinations of the pelvis were performed. Transabdominal technique was performed for global imaging of the pelvis including uterus, ovaries, adnexal regions, and pelvic cul-de-sac.  It was necessary to proceed with endovaginal exam following the abdominal ultrasound transabdominal exam to visualize the endometrium and adnexa.  Color and duplex Doppler ultrasound was utilized to evaluate blood flow to the ovaries.    FINDINGS: Uterus: 5.4 x 3.2 x 2.0cm Endometrium: 5.2cm, echogenic with cystic spaces--appears to be a polyp Adnexa:  Left: 2.5 x 1.6 x 1.0cm     Right:  2.4 x 1.2 x 1.1cm Cul de sac: no free fluid  SHSG:  After obtaining appropriate verbal consent from patient, the cervix was visualized using a speculum, and prepped with betadine.  A tenaculum  was applied to the cervix.  Cervical stenosis noted.  Several attempts at dilation of the cervix were attempted.  Due to discomfort, procedure was ended.    Discussion:  Due to findings on ultrasound and likely polyp, feel attempt at hysteroscopic removal is warranted.  With stenosis, pt is less likely to have bleeding until there is a significant amount.  She is aware that she does have risks with this procedure but I feel that conservative attempt at this point is the best recommendation.  Procedure discussed with patient.  Recovery and pain management discussed.  Risks discussed including but not limited to bleeding, rare risk of transfusion, infection, typical 1% risk of  uterine perforation (which is likely higher in her case) with risks of fluid deficit causing cardiac arrythmia, cerebral swelling and/or need to stop procedure early.  Fluid emboli and rare risk of death discussed.  DVT/PE, rare risk of risk of bowel/bladder/ureteral/vascular injury.  Patient aware if pathology abnormal she may need additional treatment.  All questions answered.    Physical Exam  Constitutional: She is oriented to person, place, and time. She appears well-developed and well-nourished.  Cardiovascular: Normal rate and regular rhythm.   Respiratory: Effort normal and breath sounds normal.  Neurological: She is alert and oriented to person, place, and time.  Psychiatric: She has a normal mood and affect.   Assessment: Thickened and irregular endometrium that appears to be an endometrial polyp today  Plan:   Hysteroscopic polyp resection with D&C.  Will plan with ultrasound guidance.  If cannot dilate cervix in OR due to scarring from prior ablation, will stop procedure and consider hysterectomy.  Pt comfortable with plan at this time.  ~25 minutes spent with patient >50% of time was in face to face discussion of above.

## 2017-03-16 ENCOUNTER — Telehealth: Payer: Self-pay | Admitting: *Deleted

## 2017-03-16 NOTE — Telephone Encounter (Signed)
Return call from patient. Questions answered regarding surgical options as reviewed with Dr Sabra Heck. Advised she will see Dr Sabra Heck in OR prior to sedation and can call back as needed for additional concerns.  Routing to provider for final review. Patient agreeable to disposition. Will close encounter.

## 2017-03-16 NOTE — Telephone Encounter (Signed)
Email message received from patient on [facility] email address.    Hi sally, It was a pleasure meeting you yesterday and thank you for your help in scheduling my surgery. Would it be recommended in my situation to have a Hysterectomy as well? I just have a polyp and my ovaries are fine. At one point I think I remember something like this may have been recommendation to woman my age. I was curious so I thought I would ask.  Thanks for your time and have a good weekend. Kara Warren   Reviewed question with Dr Sabra Heck.  Recommends proceed hysteroscopy first as need to have initial pathology prior to planning hysterectomy. It is an option, just need to be fully evaluated and know all risks and benefits. Will need consult to discuss.   Call to patient to discuss. Left message to call back.

## 2017-03-18 ENCOUNTER — Other Ambulatory Visit: Payer: Self-pay | Admitting: Obstetrics & Gynecology

## 2017-03-18 ENCOUNTER — Encounter: Payer: Self-pay | Admitting: Obstetrics & Gynecology

## 2017-03-18 DIAGNOSIS — N882 Stricture and stenosis of cervix uteri: Secondary | ICD-10-CM

## 2017-03-19 ENCOUNTER — Telehealth: Payer: Self-pay | Admitting: Obstetrics & Gynecology

## 2017-03-19 NOTE — Telephone Encounter (Signed)
Patient is returning a call to [place]. Patient is aware Deloris Ping is not in the office will call back tomorrow.

## 2017-03-19 NOTE — Telephone Encounter (Signed)
Routing to [employer] for return call upon return to office.

## 2017-03-19 NOTE — Telephone Encounter (Signed)
Call placed to patient to review benefits for a recommended surgrical procedure. Left voicemail requesting a return call.

## 2017-03-20 NOTE — Telephone Encounter (Signed)
Spoke with patient in regards to scheduled surgical procedure. Patient understood and agreeable. Patient has confirmed surgery requirements. Patient aware this is professional benefit only. Patient aware will be contacted by hospital for separate benefits. Patient has been scheduled for 03/30/17.

## 2017-03-23 NOTE — Patient Instructions (Signed)
Your procedure is scheduled on:  Friday, March 30, 2017  Enter through the Main Entrance of [facility] at:  6:00 AM  Pick up the phone at the desk and dial [phone].  Call this number if you have problems the morning of surgery: [phone].  Remember: Do NOT eat food or drink after:  Midnight Thursday  Take these medicines the morning of surgery with a SIP OF WATER:  Omeprazole  Stop ALL herbal medications and Vitamin E at this time  Do NOT smoke the day of surgery.  Do NOT wear jewelry (body piercing), metal hair clips/bobby pins, make-up, artifical eyelashes or nail polish. Do NOT wear lotions, powders, or perfumes.  You may wear deodorant. Do NOT shave for 48 hours prior to surgery. Do NOT bring valuables to the hospital. Contacts, dentures, or bridgework may not be worn into surgery.  Have a responsible adult drive you home and stay with you for 24 hours after your procedure  Bring a copy of your healthcare power of attorney and living will documents.

## 2017-03-26 ENCOUNTER — Encounter (HOSPITAL_COMMUNITY): Payer: Self-pay

## 2017-03-26 ENCOUNTER — Encounter (HOSPITAL_COMMUNITY)
Admission: RE | Admit: 2017-03-26 | Discharge: 2017-03-26 | Disposition: A | Discharge status: Home / Self Care | Payer: 59 | Source: Ambulatory Visit | Attending: Obstetrics & Gynecology | Admitting: Obstetrics & Gynecology

## 2017-03-26 ENCOUNTER — Other Ambulatory Visit: Payer: Self-pay

## 2017-03-26 DIAGNOSIS — Z01812 Encounter for preprocedural laboratory examination: Secondary | ICD-10-CM | POA: Insufficient documentation

## 2017-03-26 DIAGNOSIS — J019 Acute sinusitis, unspecified: Secondary | ICD-10-CM | POA: Diagnosis not present

## 2017-03-26 DIAGNOSIS — D709 Neutropenia, unspecified: Secondary | ICD-10-CM | POA: Insufficient documentation

## 2017-03-26 DIAGNOSIS — N840 Polyp of corpus uteri: Secondary | ICD-10-CM | POA: Diagnosis not present

## 2017-03-26 DIAGNOSIS — E785 Hyperlipidemia, unspecified: Secondary | ICD-10-CM | POA: Diagnosis not present

## 2017-03-26 DIAGNOSIS — Z01810 Encounter for preprocedural cardiovascular examination: Secondary | ICD-10-CM | POA: Insufficient documentation

## 2017-03-26 DIAGNOSIS — K635 Polyp of colon: Secondary | ICD-10-CM | POA: Diagnosis not present

## 2017-03-26 DIAGNOSIS — F329 Major depressive disorder, single episode, unspecified: Secondary | ICD-10-CM | POA: Insufficient documentation

## 2017-03-26 DIAGNOSIS — M25551 Pain in right hip: Secondary | ICD-10-CM | POA: Insufficient documentation

## 2017-03-26 DIAGNOSIS — R930 Abnormal findings on diagnostic imaging of skull and head, not elsewhere classified: Secondary | ICD-10-CM | POA: Insufficient documentation

## 2017-03-26 DIAGNOSIS — R918 Other nonspecific abnormal finding of lung field: Secondary | ICD-10-CM | POA: Diagnosis not present

## 2017-03-26 DIAGNOSIS — N340 Urethral abscess: Secondary | ICD-10-CM | POA: Insufficient documentation

## 2017-03-26 HISTORY — DX: Pneumonia, unspecified organism: J18.9

## 2017-03-26 HISTORY — DX: Personal history of other diseases of the digestive system: Z87.19

## 2017-03-26 LAB — BASIC METABOLIC PANEL
ANION GAP: 6 (ref 5–15)
BUN: 18 mg/dL (ref 6–20)
CALCIUM: 9.5 mg/dL (ref 8.9–10.3)
CO2: 28 mmol/L (ref 22–32)
Chloride: 101 mmol/L (ref 101–111)
Creatinine, Ser: 0.78 mg/dL (ref 0.44–1.00)
GLUCOSE: 119 mg/dL — AB (ref 65–99)
Potassium: 3.9 mmol/L (ref 3.5–5.1)
Sodium: 135 mmol/L (ref 135–145)

## 2017-03-26 LAB — CBC
HCT: 39.7 % (ref 36.0–46.0)
HEMOGLOBIN: 13.3 g/dL (ref 12.0–15.0)
MCH: 32.4 pg (ref 26.0–34.0)
MCHC: 33.5 g/dL (ref 30.0–36.0)
MCV: 96.8 fL (ref 78.0–100.0)
Platelets: 323 10*3/uL (ref 150–400)
RBC: 4.1 MIL/uL (ref 3.87–5.11)
RDW: 13.4 % (ref 11.5–15.5)
WBC: 6.3 10*3/uL (ref 4.0–10.5)

## 2017-03-29 NOTE — Anesthesia Preprocedure Evaluation (Addendum)
Anesthesia Evaluation  Patient identified by MRN, date of birth, ID band Patient awake    Reviewed: Allergy & Precautions, NPO status , Patient's Chart, lab work & pertinent test results  History of Anesthesia Complications Negative for: history of anesthetic complications  Airway Mallampati: II  TM Distance: >3 FB Neck ROM: Full    Dental no notable dental hx. (+) Dental Advisory Given   Pulmonary neg pulmonary ROS, former smoker,    Pulmonary exam normal        Cardiovascular negative cardio ROS Normal cardiovascular exam     Neuro/Psych  Headaches, PSYCHIATRIC DISORDERS Depression    GI/Hepatic Neg liver ROS, hiatal hernia,   Endo/Other  negative endocrine ROS  Renal/GU negative Renal ROS     Musculoskeletal negative musculoskeletal ROS (+)   Abdominal   Peds  Hematology negative hematology ROS (+)   Anesthesia Other Findings Day of surgery medications reviewed with the patient.  Reproductive/Obstetrics                            Anesthesia Physical Anesthesia Plan  ASA: II  Anesthesia Plan: General   Post-op Pain Management:    Induction: Intravenous  PONV Risk Score and Plan: 4 or greater and Ondansetron, Dexamethasone, Scopolamine patch - Pre-op and Diphenhydramine  Airway Management Planned: LMA  Additional Equipment:   Intra-op Plan:   Post-operative Plan: Extubation in OR  Informed Consent: I have reviewed the patients History and Physical, chart, labs and discussed the procedure including the risks, benefits and alternatives for the proposed anesthesia with the patient or authorized representative who has indicated his/her understanding and acceptance.   Dental advisory given  Plan Discussed with: CRNA and Anesthesiologist  Anesthesia Plan Comments:        Anesthesia Quick Evaluation

## 2017-03-30 ENCOUNTER — Encounter (HOSPITAL_COMMUNITY): Payer: Self-pay

## 2017-03-30 ENCOUNTER — Ambulatory Visit (HOSPITAL_COMMUNITY): Payer: 59 | Admitting: Anesthesiology

## 2017-03-30 ENCOUNTER — Ambulatory Visit (HOSPITAL_COMMUNITY): Payer: 59

## 2017-03-30 ENCOUNTER — Encounter (HOSPITAL_COMMUNITY)
Admission: RE | Disposition: A | Discharge status: Home / Self Care | Payer: Self-pay | Source: Ambulatory Visit | Attending: Obstetrics & Gynecology

## 2017-03-30 ENCOUNTER — Ambulatory Visit (HOSPITAL_COMMUNITY)
Admission: RE | Admit: 2017-03-30 | Discharge: 2017-03-30 | Disposition: A | Discharge status: Home / Self Care | Payer: 59 | Source: Ambulatory Visit | Attending: Obstetrics & Gynecology | Admitting: Obstetrics & Gynecology

## 2017-03-30 DIAGNOSIS — Z8349 Family history of other endocrine, nutritional and metabolic diseases: Secondary | ICD-10-CM | POA: Insufficient documentation

## 2017-03-30 DIAGNOSIS — Z833 Family history of diabetes mellitus: Secondary | ICD-10-CM | POA: Diagnosis not present

## 2017-03-30 DIAGNOSIS — R938 Abnormal findings on diagnostic imaging of other specified body structures: Secondary | ICD-10-CM | POA: Diagnosis not present

## 2017-03-30 DIAGNOSIS — N840 Polyp of corpus uteri: Secondary | ICD-10-CM | POA: Diagnosis not present

## 2017-03-30 DIAGNOSIS — Z8379 Family history of other diseases of the digestive system: Secondary | ICD-10-CM | POA: Insufficient documentation

## 2017-03-30 DIAGNOSIS — Z803 Family history of malignant neoplasm of breast: Secondary | ICD-10-CM | POA: Diagnosis not present

## 2017-03-30 DIAGNOSIS — Z8262 Family history of osteoporosis: Secondary | ICD-10-CM | POA: Insufficient documentation

## 2017-03-30 DIAGNOSIS — F17210 Nicotine dependence, cigarettes, uncomplicated: Secondary | ICD-10-CM | POA: Insufficient documentation

## 2017-03-30 DIAGNOSIS — K449 Diaphragmatic hernia without obstruction or gangrene: Secondary | ICD-10-CM | POA: Insufficient documentation

## 2017-03-30 DIAGNOSIS — Z79899 Other long term (current) drug therapy: Secondary | ICD-10-CM | POA: Insufficient documentation

## 2017-03-30 DIAGNOSIS — Z83511 Family history of glaucoma: Secondary | ICD-10-CM | POA: Diagnosis not present

## 2017-03-30 DIAGNOSIS — K589 Irritable bowel syndrome without diarrhea: Secondary | ICD-10-CM | POA: Diagnosis not present

## 2017-03-30 DIAGNOSIS — Z8371 Family history of colonic polyps: Secondary | ICD-10-CM | POA: Diagnosis not present

## 2017-03-30 DIAGNOSIS — Z800 Family history of malignant neoplasm of digestive organs: Secondary | ICD-10-CM | POA: Diagnosis not present

## 2017-03-30 DIAGNOSIS — Z86010 Personal history of colonic polyps: Secondary | ICD-10-CM | POA: Diagnosis not present

## 2017-03-30 DIAGNOSIS — N888 Other specified noninflammatory disorders of cervix uteri: Secondary | ICD-10-CM | POA: Diagnosis not present

## 2017-03-30 DIAGNOSIS — N882 Stricture and stenosis of cervix uteri: Secondary | ICD-10-CM

## 2017-03-30 DIAGNOSIS — Z8249 Family history of ischemic heart disease and other diseases of the circulatory system: Secondary | ICD-10-CM | POA: Diagnosis not present

## 2017-03-30 DIAGNOSIS — G43909 Migraine, unspecified, not intractable, without status migrainosus: Secondary | ICD-10-CM | POA: Insufficient documentation

## 2017-03-30 DIAGNOSIS — J329 Chronic sinusitis, unspecified: Secondary | ICD-10-CM | POA: Insufficient documentation

## 2017-03-30 DIAGNOSIS — N8500 Endometrial hyperplasia, unspecified: Secondary | ICD-10-CM | POA: Diagnosis not present

## 2017-03-30 HISTORY — PX: HYSTEROSCOPY WITH D & C: SHX1775

## 2017-03-30 SURGERY — DILATATION AND CURETTAGE /HYSTEROSCOPY
Anesthesia: General

## 2017-03-30 MED ORDER — DEXAMETHASONE SODIUM PHOSPHATE 10 MG/ML IJ SOLN
INTRAMUSCULAR | Status: DC | PRN
  Administered 2017-03-30: 4 mg via INTRAVENOUS

## 2017-03-30 MED ORDER — GLYCOPYRROLATE 0.2 MG/ML IJ SOLN
INTRAMUSCULAR | Status: AC
  Filled 2017-03-30: qty 1

## 2017-03-30 MED ORDER — ATROPINE SULFATE 0.4 MG/ML IJ SOLN
INTRAMUSCULAR | Status: AC
  Filled 2017-03-30: qty 1

## 2017-03-30 MED ORDER — HYDROCODONE-ACETAMINOPHEN 5-325 MG PO TABS: 1.0000 | ORAL_TABLET | Freq: Four times a day (QID) | ORAL | 0 refills | Status: DC | PRN

## 2017-03-30 MED ORDER — SODIUM CHLORIDE 0.9 % IR SOLN
Status: DC | PRN
  Administered 2017-03-30: 3000 mL

## 2017-03-30 MED ORDER — ONDANSETRON HCL 4 MG/2ML IJ SOLN
INTRAMUSCULAR | Status: AC
  Filled 2017-03-30: qty 2

## 2017-03-30 MED ORDER — LIDOCAINE-EPINEPHRINE 1 %-1:100000 IJ SOLN
INTRAMUSCULAR | Status: DC | PRN
  Administered 2017-03-30: 10 mL

## 2017-03-30 MED ORDER — HYDROMORPHONE HCL 1 MG/ML IJ SOLN
0.2500 mg | INTRAMUSCULAR | Status: DC | PRN
  Administered 2017-03-30: 0.5 mg via INTRAVENOUS

## 2017-03-30 MED ORDER — LIDOCAINE HCL 1 % IJ SOLN
INTRAMUSCULAR | Status: AC
  Filled 2017-03-30: qty 20

## 2017-03-30 MED ORDER — MIDAZOLAM HCL 2 MG/2ML IJ SOLN
INTRAMUSCULAR | Status: AC
  Filled 2017-03-30: qty 2

## 2017-03-30 MED ORDER — LACTATED RINGERS IV SOLN
INTRAVENOUS | Status: DC
  Administered 2017-03-30: 08:00:00 via INTRAVENOUS
  Administered 2017-03-30: 125 mL/h via INTRAVENOUS

## 2017-03-30 MED ORDER — LIDOCAINE HCL (CARDIAC) 20 MG/ML IV SOLN
INTRAVENOUS | Status: DC | PRN
  Administered 2017-03-30: 60 mg via INTRAVENOUS

## 2017-03-30 MED ORDER — LIDOCAINE-EPINEPHRINE 1 %-1:100000 IJ SOLN
INTRAMUSCULAR | Status: AC
  Filled 2017-03-30: qty 1

## 2017-03-30 MED ORDER — HYDROMORPHONE HCL 1 MG/ML IJ SOLN
INTRAMUSCULAR | Status: AC
  Filled 2017-03-30: qty 0.5

## 2017-03-30 MED ORDER — PROPOFOL 10 MG/ML IV BOLUS
INTRAVENOUS | Status: DC | PRN
  Administered 2017-03-30: 200 mg via INTRAVENOUS

## 2017-03-30 MED ORDER — FENTANYL CITRATE (PF) 100 MCG/2ML IJ SOLN
INTRAMUSCULAR | Status: DC | PRN
  Administered 2017-03-30 (×2): 50 ug via INTRAVENOUS

## 2017-03-30 MED ORDER — ATROPINE SULFATE 0.4 MG/ML IJ SOLN
INTRAMUSCULAR | Status: DC | PRN
  Administered 2017-03-30: 0.2 mg via INTRAVENOUS

## 2017-03-30 MED ORDER — LIDOCAINE HCL (CARDIAC) 20 MG/ML IV SOLN
INTRAVENOUS | Status: AC
  Filled 2017-03-30: qty 5

## 2017-03-30 MED ORDER — PROPOFOL 10 MG/ML IV BOLUS
INTRAVENOUS | Status: AC
  Filled 2017-03-30: qty 20

## 2017-03-30 MED ORDER — KETOROLAC TROMETHAMINE 30 MG/ML IJ SOLN
INTRAMUSCULAR | Status: DC | PRN
  Administered 2017-03-30: 30 mg via INTRAVENOUS

## 2017-03-30 MED ORDER — ONDANSETRON HCL 4 MG/2ML IJ SOLN
INTRAMUSCULAR | Status: DC | PRN
  Administered 2017-03-30: 4 mg via INTRAVENOUS

## 2017-03-30 MED ORDER — DEXAMETHASONE SODIUM PHOSPHATE 4 MG/ML IJ SOLN
INTRAMUSCULAR | Status: AC
  Filled 2017-03-30: qty 1

## 2017-03-30 MED ORDER — IBUPROFEN 800 MG PO TABS: 800.0000 mg | ORAL_TABLET | Freq: Three times a day (TID) | ORAL | 0 refills | Status: DC | PRN

## 2017-03-30 MED ORDER — GLYCOPYRROLATE 0.2 MG/ML IJ SOLN
INTRAMUSCULAR | Status: DC | PRN
  Administered 2017-03-30: 0.2 mg via INTRAVENOUS

## 2017-03-30 MED ORDER — MIDAZOLAM HCL 2 MG/2ML IJ SOLN
INTRAMUSCULAR | Status: DC | PRN
  Administered 2017-03-30: 2 mg via INTRAVENOUS

## 2017-03-30 MED ORDER — KETOROLAC TROMETHAMINE 30 MG/ML IJ SOLN
INTRAMUSCULAR | Status: AC
  Filled 2017-03-30: qty 1

## 2017-03-30 MED ORDER — PROMETHAZINE HCL 25 MG/ML IJ SOLN: 6.2500 mg | INTRAMUSCULAR | Status: DC | PRN

## 2017-03-30 MED ORDER — FENTANYL CITRATE (PF) 100 MCG/2ML IJ SOLN
INTRAMUSCULAR | Status: AC
  Filled 2017-03-30: qty 2

## 2017-03-30 MED FILL — HYDROCODON-APAP 5-325: 5-325 | 2 days supply | Qty: 12 | Fill #0

## 2017-03-30 MED FILL — IBUPROFEN 800 MG TABLET: 800 | 7 days supply | Qty: 20 | Fill #0

## 2017-03-30 SURGICAL SUPPLY — 18 items
CANISTER SUCT 3000ML PPV (MISCELLANEOUS) ×2 IMPLANT
CATH ROBINSON RED A/P 16FR (CATHETERS) ×2 IMPLANT
CLOTH BEACON ORANGE TIMEOUT ST (SAFETY) ×2 IMPLANT
CONTAINER PREFILL 10% NBF 60ML (FORM) ×4 IMPLANT
DEVICE MYOSURE LITE (MISCELLANEOUS) IMPLANT
DEVICE MYOSURE REACH (MISCELLANEOUS) IMPLANT
DILATOR CANAL MILEX (MISCELLANEOUS) ×2 IMPLANT
FILTER ARTHROSCOPY CONVERTOR (FILTER) ×2 IMPLANT
GLOVE BIOGEL PI IND STRL 7.0 (GLOVE) ×2 IMPLANT
GLOVE BIOGEL PI INDICATOR 7.0 (GLOVE) ×2
GLOVE ECLIPSE 6.5 STRL STRAW (GLOVE) ×2 IMPLANT
GOWN STRL REUS W/TWL LRG LVL3 (GOWN DISPOSABLE) ×4 IMPLANT
PACK VAGINAL MINOR WOMEN LF (CUSTOM PROCEDURE TRAY) ×2 IMPLANT
PAD OB MATERNITY 4.3X12.25 (PERSONAL CARE ITEMS) ×2 IMPLANT
SEAL ROD LENS SCOPE MYOSURE (ABLATOR) ×2 IMPLANT
TOWEL OR 17X24 6PK STRL BLUE (TOWEL DISPOSABLE) ×4 IMPLANT
TUBING AQUILEX INFLOW (TUBING) ×2 IMPLANT
TUBING AQUILEX OUTFLOW (TUBING) ×2 IMPLANT

## 2017-03-30 NOTE — Op Note (Addendum)
03/30/2017  8:03 AM  PATIENT:  Kara Warren  61 y.o. female  PRE-OPERATIVE DIAGNOSIS:  Irregular appearing endometrium and possible endometrial polyp, stenotic cervix with inability to dilate the cervix in the office, h/o endometrial ablation  POST-OPERATIVE DIAGNOSIS:  stenotic cervix, endometrial scar from prior endometrial ablation  PROCEDURE:  Procedure(s): DILATATION & CURETTAGE/HYSTEROSCOPY WITH ULTRASOUND GUIDANCE  SURGEON:  Harace Mccluney SUZANNE  ASSISTANTS: OR staff   ANESTHESIA:   general  ESTIMATED BLOOD LOSS: 10cc  BLOOD ADMINISTERED:none   FLUIDS: 1000cc LR  UOP: 50cc clear UOP drained with I&O caath  SPECIMEN:  Endometrial curetting  DISPOSITION OF SPECIMEN:  PATHOLOGY  FINDINGS: thickened scar within endometrial cavity, otherwise normal findings  DESCRIPTION OF OPERATION: Patient was taken to the operating room.  She is placed in the supine position. SCDs were on her lower extremities and functioning properly. General anesthesia with an LMA was administered without difficulty.  Legs were then placed in the [place] in the low lithotomy position. The legs were lifted to the high lithotomy position and the Betadine prep was used on the inner thighs perineum and vagina x3. Patient was draped in a normal standard fashion. The anterior lip of the cervix was grasped with single-tooth tenaculum.  A paracervical block of 1% lidocaine mixed one-to-one with epinephrine (1:100,000 units).  10 cc was used total. Ultrasound was in the OR at this point.  Due to her cerivcal stenosis, ultrasound guidance was used as the the cervix is dilated up to #7 Hagar dilators.  This was done without difficulty. The endometrial cavity sounded to 6.5cm.  The bladder was not drained to help in visualization of the uterus.  A Myosure diagnostic hysteroscope was obtained. NS was used as a hysteroscopic fluid. The hysteroscope was advanced through the endocervical canal into the  endometrial cavity.  There was a narrow cavity with significant scarring present.  The hysteroscope was removed and using a #1 toothed curette, the cavity was curetted until a rough gritty texture was noted in all quadrants.  Then the hysteroscope was passed again.  The cavity appeared much more normal.  Tubal ostium were visualized at this point.  A piece of whitened thickened tissue c/w scar was noted.  The hysteroscope was removed.  With additional curettage, this piece of tissue was removed and sent to pathology with the remainder of the curettage.  Before procedure ended, ultrasound was used again to visualize the endometrium.  It appeared thin and regular at this point.  Measurement was 2.71mm.  At this point the procedure was ended.  The hysteroscope was removed. The fluid deficit was 35 cc. The tenaculum was removed from the anterior lip of the cervix. The Sims retracotr was removed from the vagina. An in and out catheterization with a red rubber Foley catheter was performed. Approximately 50 cc of clear but concentrated urine was noted.  Sponge, lap, needle, initially counts were correct x2. Patient was awakened from anesthesia without difficulty and taken to recovery in stable condition.  COUNTS:  YES  PLAN OF CARE: Transfer to PACU

## 2017-03-30 NOTE — Anesthesia Postprocedure Evaluation (Signed)
Anesthesia Post Note  Patient: Kara Warren  Procedure(s) Performed: Procedure(s) (LRB): DILATATION & CURETTAGE/HYSTEROSCOPY WITH ULTRASOUND GUIDANCE (N/A)     Patient location during evaluation: PACU Anesthesia Type: General Level of consciousness: sedated Pain management: pain level controlled Vital Signs Assessment: post-procedure vital signs reviewed and stable Respiratory status: spontaneous breathing and respiratory function stable Cardiovascular status: stable Anesthetic complications: no    Last Vitals:  Vitals:   03/30/17 0845 03/30/17 0900  BP: 123/75 124/74  Pulse: 60 61  Resp: 12 16  Temp:  36.7 C    Last Pain:  Vitals:   03/30/17 0900  TempSrc:   PainSc: 3    Pain Goal: Patients Stated Pain Goal: 4 (03/30/17 0900)               Deldrick Linch DANIEL

## 2017-03-30 NOTE — Transfer of Care (Signed)
Immediate Anesthesia Transfer of Care Note  Patient: Kara Warren  Procedure(s) Performed: Procedure(s): DILATATION & CURETTAGE/HYSTEROSCOPY WITH ULTRASOUND GUIDANCE (N/A)  Patient Location: PACU  Anesthesia Type:General  Level of Consciousness: awake, alert  and oriented  Airway & Oxygen Therapy: Patient Spontanous Breathing and Patient connected to nasal cannula oxygen  Post-op Assessment: Report given to RN and Post -op Vital signs reviewed and stable  Post vital signs: Reviewed and stable  Last Vitals:  Vitals:   03/30/17 0608  BP: 140/88  Pulse: (!) 57  Resp: 16  Temp: 37.1 C    Last Pain:  Vitals:   03/30/17 0608  TempSrc: Oral         Complications: No apparent anesthesia complications

## 2017-03-30 NOTE — Anesthesia Procedure Notes (Signed)
Procedure Name: LMA Insertion Date/Time: 03/30/2017 7:23 AM Performed by: Jonna Munro Pre-anesthesia Checklist: Patient identified, Emergency Drugs available, Suction available, Timeout performed and Patient being monitored Patient Re-evaluated:Patient Re-evaluated prior to inductionOxygen Delivery Method: Circle system utilized Preoxygenation: Pre-oxygenation with 100% oxygen Intubation Type: IV induction LMA: LMA inserted LMA Size: 4.0 Number of attempts: 1 Placement Confirmation: positive ETCO2 and breath sounds checked- equal and bilateral Tube secured with: Tape Dental Injury: Teeth and Oropharynx as per pre-operative assessment

## 2017-03-30 NOTE — H&P (Signed)
Kara Warren is an 61 y.o. female  G0 SWF here for hysteroscopy and polyp resection due to thickened endometrium noted on imaging done due to IBS/abdominal bloating complaints.  She does have a stenotic os and I have been unable to dilate the cervix in the office due to h/o prior endometrial ablation.  She is aware that it is possible I may not be able to dilate the cervix and she is comfortable with proceeding with hysterectomy later if I am unsuccessful today.  Ultrasound guidance has been scheduled for today as well.  Risks, alternatives, benefits all reviewed in the office.  Pt has no new questions today.  She is here and ready to proceed.  Pertinent Gynecological History: Menses: post-menopausal Bleeding: none Contraception: post menopausal status DES exposure: denies Blood transfusions: none Sexually transmitted diseases: no past history Previous GYN Procedures: endometrial ablation  Last mammogram: normal Date: 03/30/16 Last pap: normal Date: 1/18 neg with neg HR HPV OB History: G0, P0   Menstrual History: Patient's last menstrual period was 10/02/2006 (approximate).    Past Medical History:  Diagnosis Date  . Colon polyp   . Endometrial polyp 8/08   benign  . History of hiatal hernia   . IBS (irritable bowel syndrome)   . Menopause   . Migraine   . Pneumonia   . Sinusitis, chronic   . 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 COLONOSCOPY    . DILATION AND CURETTAGE OF UTERUS  8/08  . GYNECOLOGIC CRYOSURGERY  1980  . UPPER GI ENDOSCOPY    . uterine ablation  2007    Family History  Problem Relation Age of Onset  . Colon polyps Father   . Ulcerative colitis Father   . CAD Father        Stent placement at age 58  . Glaucoma Father   . Hypertension Father   . Colitis Father   . Breast cancer Paternal Grandmother   . Colon cancer Maternal Grandfather   . Colon polyps Mother   . Osteoporosis  Mother   . Thyroid disease Mother        hypo  . Glaucoma Mother   . Diverticulosis Mother   . Sudden death Neg Hx   . Hyperlipidemia Neg Hx   . Heart attack Neg Hx   . Diabetes Neg Hx     Social History:  reports that she quit smoking about 38 years ago. Her smoking use included Cigarettes. She has a 0.30 pack-year smoking history. She has never used smokeless tobacco. She reports that she drinks about 4.2 oz of alcohol per week . She reports that she does not use drugs.  Allergies: No Known Allergies  Prescriptions Prior to Admission  Medication Sig Dispense Refill Last Dose  . acetaminophen (TYLENOL) 500 MG tablet Take 500 mg by mouth every 8 (eight) hours as needed for mild pain.   Past Week at Unknown time  . Black Cohosh 540 MG CAPS Take 540 mg by mouth daily.   Past Month at Unknown time  . citalopram (CELEXA) 20 MG tablet TAKE 1 TABLET BY MOUTH ONCE DAILY (Patient taking differently: TAKE 1 TABLET BY MOUTH ONCE IN THE EVENING) 90 tablet 3 03/29/2017 at Unknown time  . montelukast (SINGULAIR) 10 MG tablet Take 1 tablet by mouth daily.  3 Past Month at Unknown time  . Multiple Vitamins-Minerals (MULTI FOR HER 50+) TABS Take 1 tablet by  mouth daily.     03/29/2017 at Unknown time  . omeprazole (PRILOSEC) 40 MG capsule Take 40 mg by mouth daily before breakfast.   1 03/30/2017 at 0500  . predniSONE (DELTASONE) 10 MG tablet TAKE AS DIRECTED: 4 DAY X 5 DAYS,THEN 3 DAY X 5 DAYS,THEN 2 DAY X 5 DAYS,THEN 1 DAY X 5 DAYS, THEN 1 2 DAY X 5 DAYS  0 03/29/2017 at Unknown time  . Probiotic Product (PROBIOTIC PO) Take 1 capsule by mouth daily. Called ultra spectrum   03/29/2017 at Unknown time  . vitamin E 400 UNIT capsule Take 400 Units by mouth daily.   Past Month at Unknown time  . Zinc 50 MG CAPS Take 50 mg by mouth daily.   Past Month at Unknown time  . ipratropium (ATROVENT) 0.06 % nasal spray Place 2 sprays into both nostrils 4 (four) times daily. (Patient not taking: Reported on 12/08/2016) 15 mL  1 Not Taking    Review of Systems  All other systems reviewed and are negative.   Blood pressure 140/88, pulse (!) 57, temperature 98.7 F (37.1 C), temperature source Oral, resp. rate 16, last menstrual period 10/02/2006, SpO2 100 %. Physical Exam  Constitutional: She is oriented to person, place, and time. She appears well-developed and well-nourished.  Cardiovascular: Normal rate and regular rhythm.   Respiratory: Effort normal and breath sounds normal.  Neurological: She is alert and oriented to person, place, and time.  Psychiatric: She has a normal mood and affect.    No results found for this or any previous visit (from the past 24 hour(s)).  No results found.  Assessment/Plan: 61 yo G0 MWF here for hysteroscopy, ultrasound guidance of this, with polyp resection, D&C.  All questions answered.  Pt ready to proceed.  Hale Bogus SUZANNE 03/30/2017, 7:02 AM

## 2017-03-30 NOTE — Discharge Instructions (Addendum)
Post-surgical Instructions, Outpatient Surgery  You may expect to feel dizzy, weak, and drowsy for as long as 24 hours after receiving the medicine that made you sleep (anesthetic). For the first 24 hours after your surgery:   Do not drive a car, ride a bicycle, participate in physical activities, or take public transportation until you are done taking narcotic pain medicines or as directed by Dr. Miller.  Do not drink alcohol or take tranquilizers.  Do not take medicine that has not been prescribed by your physicians.  Do not sign important papers or make important decisions while on narcotic pain medicines.  Have a responsible person with you.   PAIN MANAGEMENT Motrin 800mg.  (This is the same as 4-200mg over the counter tablets of Motrin or ibuprofen.)  You may take this every eight hours or as needed for cramping.   Vicodin 5/325mg.  For more severe pain, take one or two tablets every four to six hours as needed for pain control.  (Remember that narcotic pain medications increase your risk of constipation.  If this becomes a problem, you may take an over the counter stool softener like Colace 100mg up to four times a day.)  DO'S AND DON'T'S Do not take a tub bath for one week.  You may shower on the first day after your surgery Do not do any heavy lifting for one to two weeks.  This increases the chance of bleeding. Do move around as you feel able.  Stairs are fine.  You may begin to exercise again as you feel able.  Do not lift any weights for two weeks. Do not put anything in the vagina for two weeks--no tampons, intercourse, or douching.    REGULAR MEDIATIONS/VITAMINS: You may restart all of your regular medications as prescribed. You may restart all of your vitamins as you normally take them.    PLEASE CALL OR SEEK MEDICAL CARE IF: You have persistent nausea and vomiting.  You have trouble eating or drinking.  You have an oral temperature above 100.5.  You have constipation that is  not helped by adjusting diet or increasing fluid intake. Pain medicines are a common cause of constipation.  You have heavy vaginal bleeding   Post Anesthesia Home Care Instructions  Activity: Get plenty of rest for the remainder of the day. A responsible individual must stay with you for 24 hours following the procedure.  For the next 24 hours, DO NOT: -Drive a car -Operate machinery -Drink alcoholic beverages -Take any medication unless instructed by your physician -Make any legal decisions or sign important papers.  Meals: Start with liquid foods such as gelatin or soup. Progress to regular foods as tolerated. Avoid greasy, spicy, heavy foods. If nausea and/or vomiting occur, drink only clear liquids until the nausea and/or vomiting subsides. Call your physician if vomiting continues.  Special Instructions/Symptoms: Your throat may feel dry or sore from the anesthesia or the breathing tube placed in your throat during surgery. If this causes discomfort, gargle with warm salt water. The discomfort should disappear within 24 hours.  If you had a scopolamine patch placed behind your ear for the management of post- operative nausea and/or vomiting:  1. The medication in the patch is effective for 72 hours, after which it should be removed.  Wrap patch in a tissue and discard in the trash. Wash hands thoroughly with soap and water. 2. You may remove the patch earlier than 72 hours if you experience unpleasant side effects which may   include dry mouth, dizziness or visual disturbances. 3. Avoid touching the patch. Wash your hands with soap and water after contact with the patch.      

## 2017-03-31 ENCOUNTER — Encounter (HOSPITAL_COMMUNITY): Payer: Self-pay | Admitting: Obstetrics & Gynecology

## 2017-04-02 ENCOUNTER — Encounter (HOSPITAL_BASED_OUTPATIENT_CLINIC_OR_DEPARTMENT_OTHER): Payer: Self-pay

## 2017-04-02 ENCOUNTER — Ambulatory Visit (HOSPITAL_BASED_OUTPATIENT_CLINIC_OR_DEPARTMENT_OTHER)
Admission: RE | Admit: 2017-04-02 | Discharge: 2017-04-02 | Disposition: A | Discharge status: Home / Self Care | Payer: 59 | Source: Ambulatory Visit | Attending: Family Medicine | Admitting: Family Medicine

## 2017-04-02 DIAGNOSIS — Z1231 Encounter for screening mammogram for malignant neoplasm of breast: Secondary | ICD-10-CM | POA: Diagnosis not present

## 2017-04-03 ENCOUNTER — Telehealth: Payer: Self-pay

## 2017-04-03 NOTE — Telephone Encounter (Signed)
Left message to call Kaitlyn at [phone]. 

## 2017-04-03 NOTE — Telephone Encounter (Signed)
-----   Message from Megan Salon, MD sent at 04/02/2017  3:01 PM EDT ----- Please call pt and see how she is doing.  Also let her know her pathology showed atrophic endometrium--which is what it should be.  No abnormal cells were noted.  Thanks.

## 2017-04-05 NOTE — Telephone Encounter (Signed)
Returning call to Kaitlyn. °

## 2017-04-05 NOTE — Telephone Encounter (Signed)
Gym is fine.  Swimming should not be done for two weeks or until bleeding stops (whichever is sooner).  Thanks.

## 2017-04-05 NOTE — Telephone Encounter (Signed)
Spoke with patient. Advised of message as seen below from [place]. Patient verbalizes understanding.  Routing to provider for final review. Patient agreeable to disposition. Will close encounter.

## 2017-04-05 NOTE — Telephone Encounter (Signed)
Spoke with patient. Advised of results as seen below from [place]. Patient verbalizes understanding. States she is feeling well following her D&C on 03/30/2017. States she is having light spotting. Denies and pain or heavy bleeding. Patient would like to go swimming and perform heavy lifting at the gym. Asking if this is okay or if Dr.Miller would like her to wait until her 2 week recheck.

## 2017-04-09 DIAGNOSIS — Z6823 Body mass index (BMI) 23.0-23.9, adult: Secondary | ICD-10-CM | POA: Diagnosis not present

## 2017-04-09 DIAGNOSIS — K589 Irritable bowel syndrome without diarrhea: Secondary | ICD-10-CM | POA: Diagnosis not present

## 2017-04-09 DIAGNOSIS — J309 Allergic rhinitis, unspecified: Secondary | ICD-10-CM | POA: Diagnosis not present

## 2017-04-09 DIAGNOSIS — F411 Generalized anxiety disorder: Secondary | ICD-10-CM | POA: Diagnosis not present

## 2017-04-13 ENCOUNTER — Ambulatory Visit (INDEPENDENT_AMBULATORY_CARE_PROVIDER_SITE_OTHER): Payer: 59 | Admitting: Obstetrics & Gynecology

## 2017-04-13 ENCOUNTER — Encounter: Payer: Self-pay | Admitting: Obstetrics & Gynecology

## 2017-04-13 VITALS — BP 102/64 | HR 60 | Resp 14 | Ht 65.0 in | Wt 145.0 lb

## 2017-04-13 DIAGNOSIS — R935 Abnormal findings on diagnostic imaging of other abdominal regions, including retroperitoneum: Secondary | ICD-10-CM

## 2017-04-13 NOTE — Progress Notes (Signed)
Post Operative Visit  Procedure: DILATATION & CURETTAGE/HYSTEROSCOPY WITH ULTRASOUND GUIDANCE Days Post-op:  14 days  Subjective: Doing well.  Had some spotting about six days after procedure.  Pt reports she did not have much pain and only took Motrin for this.  Very happy the hysteroscopy was able to be done. Pathology reviewed showing atrophic endometrium only.  Abnormal appearance on ultrasound likely due to hx of endometrial ablation.   On a different topic, pt would like me to have copy of recent GI evlauation done with Dr. Collene Mares so records can be in EP[facility].  Objective: BP 102/64 (BP Location: Right Arm, Patient Position: Sitting, Cuff Size: Normal)   Pulse 60   Resp 14   Ht 5\' 5"  (1.651 m)   Wt 145 lb (65.8 kg)   LMP 10/02/2006 (Approximate)   BMI 24.13 kg/m   EXAM General: alert and cooperative  GI: soft, non-tender; bowel sounds normal; no masses,  no organomegaly Extremities: extremities normal, atraumatic, no cyanosis or edema Vaginal Bleeding: none  GYN:  NAEFG, vaginal without lesions, cervix closed, no bleeding, no CMT  Assessment: s/p hysteroscopy with D&C due to irregular endometrium.  Final pathology showed atrophic endometrium  Plan: Pt knows to call with any future bleeding. Release of records from Dr. Collene Mares for scanning into EPIC Return for AEX 1 year or follow-up prn

## 2017-05-07 MED FILL — MONTELUKAST SOD 10 MG TAB: 10 | 90 days supply | Qty: 90 | Fill #1

## 2017-05-07 MED FILL — CITALOPRAM HBR 20 MG TABLET: 20 | 90 days supply | Qty: 90 | Fill #2

## 2017-05-07 MED FILL — OMEPRAZOLE DR 40 MG CAPSULE: 40 | 90 days supply | Qty: 90 | Fill #0

## 2017-06-11 DIAGNOSIS — J309 Allergic rhinitis, unspecified: Secondary | ICD-10-CM | POA: Diagnosis not present

## 2017-06-11 DIAGNOSIS — F419 Anxiety disorder, unspecified: Secondary | ICD-10-CM | POA: Diagnosis not present

## 2017-06-11 DIAGNOSIS — K589 Irritable bowel syndrome without diarrhea: Secondary | ICD-10-CM | POA: Diagnosis not present

## 2017-06-11 DIAGNOSIS — K219 Gastro-esophageal reflux disease without esophagitis: Secondary | ICD-10-CM | POA: Diagnosis not present

## 2017-06-11 MED FILL — AZELASTINE HCL 137 MCG SPRY: 0.1 | 30 days supply | Qty: 30 | Fill #0

## 2017-06-11 MED FILL — FLUTICASONE PROP 50 MCG SPR: 50 | 30 days supply | Qty: 16 | Fill #0

## 2017-07-05 DIAGNOSIS — J069 Acute upper respiratory infection, unspecified: Secondary | ICD-10-CM | POA: Diagnosis not present

## 2017-07-31 MED FILL — CITALOPRAM HBR 20 MG TABLET: 20 | 90 days supply | Qty: 90 | Fill #3

## 2017-07-31 MED FILL — OMEPRAZOLE DR 40 MG CAPSULE: 40 | 90 days supply | Qty: 90 | Fill #1

## 2017-08-29 DIAGNOSIS — J329 Chronic sinusitis, unspecified: Secondary | ICD-10-CM | POA: Diagnosis not present

## 2017-08-29 MED FILL — AZITHROMYCIN 250 MG TABLET: 250 | 5 days supply | Qty: 6 | Fill #0

## 2017-09-17 DIAGNOSIS — L821 Other seborrheic keratosis: Secondary | ICD-10-CM | POA: Diagnosis not present

## 2017-09-17 DIAGNOSIS — D225 Melanocytic nevi of trunk: Secondary | ICD-10-CM | POA: Diagnosis not present

## 2017-09-17 DIAGNOSIS — L814 Other melanin hyperpigmentation: Secondary | ICD-10-CM | POA: Diagnosis not present

## 2017-09-17 DIAGNOSIS — L820 Inflamed seborrheic keratosis: Secondary | ICD-10-CM | POA: Diagnosis not present

## 2017-10-15 DIAGNOSIS — Z0000 Encounter for general adult medical examination without abnormal findings: Secondary | ICD-10-CM | POA: Diagnosis not present

## 2017-10-15 DIAGNOSIS — Z139 Encounter for screening, unspecified: Secondary | ICD-10-CM | POA: Diagnosis not present

## 2017-10-17 ENCOUNTER — Other Ambulatory Visit: Payer: Self-pay | Admitting: Family Medicine

## 2017-10-17 DIAGNOSIS — E2839 Other primary ovarian failure: Secondary | ICD-10-CM

## 2017-10-17 DIAGNOSIS — K219 Gastro-esophageal reflux disease without esophagitis: Secondary | ICD-10-CM | POA: Diagnosis not present

## 2017-10-17 DIAGNOSIS — Z6824 Body mass index (BMI) 24.0-24.9, adult: Secondary | ICD-10-CM | POA: Diagnosis not present

## 2017-10-17 DIAGNOSIS — Z0000 Encounter for general adult medical examination without abnormal findings: Secondary | ICD-10-CM | POA: Diagnosis not present

## 2017-10-17 DIAGNOSIS — Z1211 Encounter for screening for malignant neoplasm of colon: Secondary | ICD-10-CM | POA: Diagnosis not present

## 2017-10-17 DIAGNOSIS — F419 Anxiety disorder, unspecified: Secondary | ICD-10-CM | POA: Diagnosis not present

## 2017-10-17 MED FILL — CITALOPRAM HBR 20 MG TABLET: 20 | 90 days supply | Qty: 90 | Fill #0

## 2017-10-17 MED FILL — OMEPRAZOLE 20 MG CAP: 20 | 90 days supply | Qty: 90 | Fill #0

## 2017-10-22 MED FILL — MONTELUKAST SOD 10 MG TAB: 10 | 30 days supply | Qty: 30 | Fill #0

## 2017-11-02 ENCOUNTER — Ambulatory Visit
Admission: RE | Admit: 2017-11-02 | Discharge: 2017-11-02 | Disposition: A | Discharge status: Home / Self Care | Payer: 59 | Source: Ambulatory Visit | Attending: Family Medicine | Admitting: Family Medicine

## 2017-11-02 DIAGNOSIS — M85852 Other specified disorders of bone density and structure, left thigh: Secondary | ICD-10-CM | POA: Diagnosis not present

## 2017-11-02 DIAGNOSIS — Z780 Asymptomatic menopausal state: Secondary | ICD-10-CM | POA: Diagnosis not present

## 2017-11-02 DIAGNOSIS — E2839 Other primary ovarian failure: Secondary | ICD-10-CM

## 2017-11-05 MED FILL — AZELASTINE HCL 137 MCG SPRY: 0.1 | 25 days supply | Qty: 30 | Fill #1

## 2017-11-06 DIAGNOSIS — Z6824 Body mass index (BMI) 24.0-24.9, adult: Secondary | ICD-10-CM | POA: Diagnosis not present

## 2017-11-06 DIAGNOSIS — J069 Acute upper respiratory infection, unspecified: Secondary | ICD-10-CM | POA: Diagnosis not present

## 2017-11-06 DIAGNOSIS — E559 Vitamin D deficiency, unspecified: Secondary | ICD-10-CM | POA: Diagnosis not present

## 2017-11-06 DIAGNOSIS — J9801 Acute bronchospasm: Secondary | ICD-10-CM | POA: Diagnosis not present

## 2017-11-06 MED FILL — VENTOLIN HFA 90 MCG INHALER: 108 (90 BAS | 16 days supply | Qty: 18 | Fill #0

## 2017-11-06 MED FILL — BENZONATATE 200 MG CAPSULE: 200 | 10 days supply | Qty: 30 | Fill #0

## 2017-11-08 MED FILL — predniSONE 10 MG TABS: 10 | 4 days supply | Qty: 10 | Fill #0

## 2017-12-19 DIAGNOSIS — J309 Allergic rhinitis, unspecified: Secondary | ICD-10-CM | POA: Diagnosis not present

## 2017-12-19 DIAGNOSIS — K219 Gastro-esophageal reflux disease without esophagitis: Secondary | ICD-10-CM | POA: Diagnosis not present

## 2017-12-19 DIAGNOSIS — Z6824 Body mass index (BMI) 24.0-24.9, adult: Secondary | ICD-10-CM | POA: Diagnosis not present

## 2017-12-19 DIAGNOSIS — F411 Generalized anxiety disorder: Secondary | ICD-10-CM | POA: Diagnosis not present

## 2017-12-19 MED FILL — MONTELUKAST SOD 10 MG TAB: 10 | 90 days supply | Qty: 90 | Fill #0

## 2017-12-19 MED FILL — raNITIdine HCL 150 MG TABS: 150 | 90 days supply | Qty: 180 | Fill #0

## 2017-12-19 MED FILL — OMEPRAZOLE 20 MG CAP: 20 | 45 days supply | Qty: 90 | Fill #0

## 2017-12-25 DIAGNOSIS — Z6824 Body mass index (BMI) 24.0-24.9, adult: Secondary | ICD-10-CM | POA: Diagnosis not present

## 2017-12-25 DIAGNOSIS — J069 Acute upper respiratory infection, unspecified: Secondary | ICD-10-CM | POA: Diagnosis not present

## 2017-12-25 MED FILL — IPRATROPIUM 0.06% SPRAY: 0.06 | 30 days supply | Qty: 15 | Fill #0

## 2017-12-25 MED FILL — VENTOLIN HFA 90 MCG INHALER: 108 (90 BAS | 17 days supply | Qty: 18 | Fill #0

## 2017-12-27 DIAGNOSIS — Z6824 Body mass index (BMI) 24.0-24.9, adult: Secondary | ICD-10-CM | POA: Diagnosis not present

## 2017-12-27 DIAGNOSIS — J101 Influenza due to other identified influenza virus with other respiratory manifestations: Secondary | ICD-10-CM | POA: Diagnosis not present

## 2017-12-27 DIAGNOSIS — J069 Acute upper respiratory infection, unspecified: Secondary | ICD-10-CM | POA: Diagnosis not present

## 2017-12-27 DIAGNOSIS — J189 Pneumonia, unspecified organism: Secondary | ICD-10-CM | POA: Diagnosis not present

## 2017-12-27 MED FILL — OSELTAMIVIR PHOSPHATE 75 MG: 75 | 5 days supply | Qty: 10 | Fill #0

## 2017-12-27 MED FILL — AZITHROMYCIN 250 MG TABLET: 250 | 5 days supply | Qty: 6 | Fill #0

## 2017-12-31 DIAGNOSIS — J101 Influenza due to other identified influenza virus with other respiratory manifestations: Secondary | ICD-10-CM | POA: Diagnosis not present

## 2017-12-31 DIAGNOSIS — J9801 Acute bronchospasm: Secondary | ICD-10-CM | POA: Diagnosis not present

## 2017-12-31 DIAGNOSIS — J189 Pneumonia, unspecified organism: Secondary | ICD-10-CM | POA: Diagnosis not present

## 2018-01-01 DIAGNOSIS — S93401A Sprain of unspecified ligament of right ankle, initial encounter: Secondary | ICD-10-CM | POA: Diagnosis not present

## 2018-01-07 DIAGNOSIS — S93401D Sprain of unspecified ligament of right ankle, subsequent encounter: Secondary | ICD-10-CM | POA: Diagnosis not present

## 2018-01-07 DIAGNOSIS — Z6825 Body mass index (BMI) 25.0-25.9, adult: Secondary | ICD-10-CM | POA: Diagnosis not present

## 2018-01-07 DIAGNOSIS — R5383 Other fatigue: Secondary | ICD-10-CM | POA: Diagnosis not present

## 2018-01-07 DIAGNOSIS — J309 Allergic rhinitis, unspecified: Secondary | ICD-10-CM | POA: Diagnosis not present

## 2018-01-07 DIAGNOSIS — J101 Influenza due to other identified influenza virus with other respiratory manifestations: Secondary | ICD-10-CM | POA: Diagnosis not present

## 2018-01-07 MED FILL — AZELASTINE HCL 137 MCG SPRY: 0.1 | 25 days supply | Qty: 30 | Fill #0

## 2018-01-07 MED FILL — DICLOFENAC SODIUM 1% GEL: 1 | 19 days supply | Qty: 300 | Fill #0

## 2018-01-09 ENCOUNTER — Ambulatory Visit: Payer: 59 | Admitting: Family Medicine

## 2018-01-09 DIAGNOSIS — T148XXA Other injury of unspecified body region, initial encounter: Secondary | ICD-10-CM | POA: Diagnosis not present

## 2018-01-09 DIAGNOSIS — S99911A Unspecified injury of right ankle, initial encounter: Secondary | ICD-10-CM | POA: Diagnosis not present

## 2018-01-09 NOTE — Patient Instructions (Signed)
You have a hairline 5th metatarsal avulsion fracture and an ankle sprain Ice the area for 15 minutes at a time, 3-4 times a day Aleve 2 tabs twice a day with food OR ibuprofen 3 tabs three times a day with food for pain and inflammation as needed. Elevate above the level of your heart when possible Crutches if needed to help with walking. Bear weight when tolerated Use cam walker when up and walking around to help with stability while you recover from this injury. Come out of the boot at least twice a day to do Up/down and alphabet exercises 2-3 sets of each. Ok to remove this to wash area, ice, sleep, and drive. Expect this to take about 6 weeks to heal. Follow up in 3 weeks - bring your laceup brace with you at that time.

## 2018-01-11 ENCOUNTER — Encounter: Payer: Self-pay | Admitting: Family Medicine

## 2018-01-11 DIAGNOSIS — S99911D Unspecified injury of right ankle, subsequent encounter: Secondary | ICD-10-CM

## 2018-01-11 HISTORY — DX: Unspecified injury of right ankle, subsequent encounter: S99.911D

## 2018-01-11 NOTE — Assessment & Plan Note (Signed)
consistent with 5th metatarsal avulsion fracture and lateral ankle sprain.  Icing, aleve or ibuprofen.  Cam walker.  Crutches if needed.  F/u in 3 weeks.  Expect 6 total weeks for this to heal.

## 2018-01-11 NOTE — Progress Notes (Signed)
PCP: Fanny Bien, MD  Subjective:   HPI: Patient is a 62 y.o. female here for right ankle injury.  Patient reports on 4/2 she was stepping off a curb and badly inverted her right ankle. Immediate pain, swelling laterally but could bear weight. Has prior sprain years ago and possible fracture. Has been icing, using ASO, voltaren gel, taking tylenol. Pain is 1-2/10 currently lateral ankle and foot, sharp. Worse with walking. + bruising but no other skin changes. No numbness.  Past Medical History:  Diagnosis Date  . Colon polyp   . Endometrial polyp 8/08   benign  . History of hiatal hernia   . IBS (irritable bowel syndrome)   . Menopause   . Migraine   . Pneumonia   . Sinusitis, chronic   . Skene's gland abscess 2009   patient unaware    Current Outpatient Medications on File Prior to Visit  Medication Sig Dispense Refill  . acetaminophen (TYLENOL) 500 MG tablet Take 500 mg by mouth every 8 (eight) hours as needed for mild pain.    Marland Kitchen azelastine (ASTELIN) 0.1 % nasal spray Use 2 sprays in each nostril 2 times per day as needed  11  . citalopram (CELEXA) 20 MG tablet TAKE 1 TABLET BY MOUTH ONCE DAILY (Patient taking differently: TAKE 1 TABLET BY MOUTH ONCE IN THE EVENING) 90 tablet 3  . diclofenac sodium (VOLTAREN) 1 % GEL APPLY 2 TO 4 GRAMS TOPICALLY TO AFFECTED AREA 4 TIMES PER DAY WHEN NEEDED  3  . montelukast (SINGULAIR) 10 MG tablet Take 1 tablet by mouth daily.  3  . Multiple Vitamins-Minerals (MULTI FOR HER 50+) TABS Take 1 tablet by mouth daily.      Marland Kitchen omeprazole (PRILOSEC) 40 MG capsule Take 20 mg by mouth daily before breakfast.   1  . Probiotic Product (PROBIOTIC PO) Take 1 capsule by mouth daily. Called ultra spectrum    . ranitidine (ZANTAC) 150 MG tablet     . vitamin E 400 UNIT capsule Take 400 Units by mouth daily.    . Zinc 50 MG CAPS Take 50 mg by mouth daily.     No current facility-administered medications on file prior to visit.     Past  Surgical History:  Procedure Laterality Date  . COLONOSCOPY    . DILATION AND CURETTAGE OF UTERUS  8/08  . GYNECOLOGIC CRYOSURGERY  1980  . HYSTEROSCOPY W/D&C N/A 03/30/2017   Procedure: DILATATION & CURETTAGE/HYSTEROSCOPY WITH ULTRASOUND GUIDANCE;  Surgeon: Megan Salon, MD;  Location: [facility];  Service: Gynecology;  Laterality: N/A;  . UPPER GI ENDOSCOPY    . uterine ablation  2007    No Known Allergies  Social History   Socioeconomic History  . Marital status: Single    Spouse name: Not on file  . Number of children: 0  . Years of education: Not on file  . Highest education level: Not on file  Occupational History  . Occupation: Programmer, multimedia: [employer]  . Financial resource strain: Not on file  . Food insecurity:    Worry: Not on file    Inability: Not on file  . Transportation needs:    Medical: Not on file    Non-medical: Not on file  Tobacco Use  . Smoking status: Former Smoker    Packs/day: 0.10    Years: 3.00    Pack years: 0.30    Types: Cigarettes    Last attempt to quit:  10/02/1978    Years since quitting: 39.3  . Smokeless tobacco: Never Used  Substance and Sexual Activity  . Alcohol use: Yes    Alcohol/week: 4.2 oz    Types: 7 Glasses of wine per week    Comment: per week  . Drug use: No  . Sexual activity: Not Currently    Birth control/protection: Post-menopausal  Lifestyle  . Physical activity:    Days per week: Not on file    Minutes per session: Not on file  . Stress: Not on file  Relationships  . Social connections:    Talks on phone: Not on file    Gets together: Not on file    Attends religious service: Not on file    Active member of club or organization: Not on file    Attends meetings of clubs or organizations: Not on file    Relationship status: Not on file  . Intimate partner violence:    Fear of current or ex partner: Not on file    Emotionally abused: Not on file    Physically abused: Not on file    Forced  sexual activity: Not on file  Other Topics Concern  . Not on file  Social History Narrative  . Not on file    Family History  Problem Relation Age of Onset  . Colon polyps Father   . Ulcerative colitis Father   . CAD Father        Stent placement at age 30  . Glaucoma Father   . Hypertension Father   . Colitis Father   . Breast cancer Paternal Grandmother   . Colon cancer Maternal Grandfather   . Colon polyps Mother   . Osteoporosis Mother   . Thyroid disease Mother        hypo  . Glaucoma Mother   . Diverticulosis Mother   . Sudden death Neg Hx   . Hyperlipidemia Neg Hx   . Heart attack Neg Hx   . Diabetes Neg Hx     BP 112/78   Ht 5\' 5"  (1.651 m)   Wt 148 lb (67.1 kg)   LMP 10/02/2006 (Approximate)   BMI 24.63 kg/m   Review of Systems: See HPI above.     Objective:  Physical Exam:  Gen: NAD, comfortable in exam room  Right ankle: Mild swelling but bruising noted throughout lateral ankle into dorsal and lateral foot.  No other deformity. Mod limitation ROM all directions - pain primarily with ER. TTP greatest over 5th metatarsal, less over ATFL and over bruised area. 2+ ant drawer, 1+ talar tilt. Negative syndesmotic compression. Thompsons test negative. NV intact distally.  Left ankle: No deformity. FROM with 5/5 strength. No tenderness to palpation. NVI distally.   MSK u/s right ankle/foot:  Peroneal tendons intact without abnormalities.  Small irregularity of distal fibula but similar appearance left ankle.  Effusion noted.  Cortical irregularity with edema overlying cortex of base 5th metatarsal consistent with avulsion fracture, nondisplaced.  Assessment & Plan:  1. Right foot/ankle injury - consistent with 5th metatarsal avulsion fracture and lateral ankle sprain.  Icing, aleve or ibuprofen.  Cam walker.  Crutches if needed.  F/u in 3 weeks.  Expect 6 total weeks for this to heal.

## 2018-01-30 ENCOUNTER — Ambulatory Visit: Payer: 59 | Admitting: Family Medicine

## 2018-01-30 DIAGNOSIS — S99911D Unspecified injury of right ankle, subsequent encounter: Secondary | ICD-10-CM | POA: Diagnosis not present

## 2018-01-30 NOTE — Patient Instructions (Signed)
You're doing great! Continue with the boot, your current treatment for 2 more weeks. Then switch to tennis/running shoes when up and walking around. In 2 weeks start the theraband strengthening also. I would wait 4 weeks before returning to running. Follow up with me in 4 weeks for reevaluation.

## 2018-01-31 ENCOUNTER — Encounter: Payer: Self-pay | Admitting: Family Medicine

## 2018-01-31 NOTE — Assessment & Plan Note (Signed)
healing 5th metatarsal avulsion fracture and lateral ankle sprain.  Continue boot for 2 more weeks then switch to supportive shoes.  Start theraband strengthening then.  Wait about 4 weeks before return to running - follow up then.  Tylenol motrin if needed.

## 2018-01-31 NOTE — Progress Notes (Signed)
PCP: Fanny Bien, MD  Subjective:   HPI: Patient is a 63 y.o. female here for right ankle injury.  4/10: Patient reports on 4/2 she was stepping off a curb and badly inverted her right ankle. Immediate pain, swelling laterally but could bear weight. Has prior sprain years ago and possible fracture. Has been icing, using ASO, voltaren gel, taking tylenol. Pain is 1-2/10 currently lateral ankle and foot, sharp. Worse with walking. + bruising but no other skin changes. No numbness.  5/1: Patient reports she's doing well. She has been icing, wearing boot except when driving. Wearing ASO under the boot. Minimal soreness lateral foot. No skin changes, numbness.  Past Medical History:  Diagnosis Date  . Colon polyp   . Endometrial polyp 8/08   benign  . History of hiatal hernia   . IBS (irritable bowel syndrome)   . Menopause   . Migraine   . Pneumonia   . Sinusitis, chronic   . Skene's gland abscess 2009   patient unaware    Current Outpatient Medications on File Prior to Visit  Medication Sig Dispense Refill  . acetaminophen (TYLENOL) 500 MG tablet Take 500 mg by mouth every 8 (eight) hours as needed for mild pain.    Marland Kitchen azelastine (ASTELIN) 0.1 % nasal spray Use 2 sprays in each nostril 2 times per day as needed  11  . citalopram (CELEXA) 20 MG tablet TAKE 1 TABLET BY MOUTH ONCE DAILY (Patient taking differently: TAKE 1 TABLET BY MOUTH ONCE IN THE EVENING) 90 tablet 3  . diclofenac sodium (VOLTAREN) 1 % GEL APPLY 2 TO 4 GRAMS TOPICALLY TO AFFECTED AREA 4 TIMES PER DAY WHEN NEEDED  3  . montelukast (SINGULAIR) 10 MG tablet Take 1 tablet by mouth daily.  3  . Multiple Vitamins-Minerals (MULTI FOR HER 50+) TABS Take 1 tablet by mouth daily.      Marland Kitchen omeprazole (PRILOSEC) 40 MG capsule Take 20 mg by mouth daily before breakfast.   1  . Probiotic Product (PROBIOTIC PO) Take 1 capsule by mouth daily. Called ultra spectrum    . ranitidine (ZANTAC) 150 MG tablet     .  vitamin E 400 UNIT capsule Take 400 Units by mouth daily.    . Zinc 50 MG CAPS Take 50 mg by mouth daily.     No current facility-administered medications on file prior to visit.     Past Surgical History:  Procedure Laterality Date  . COLONOSCOPY    . DILATION AND CURETTAGE OF UTERUS  8/08  . GYNECOLOGIC CRYOSURGERY  1980  . HYSTEROSCOPY W/D&C N/A 03/30/2017   Procedure: DILATATION & CURETTAGE/HYSTEROSCOPY WITH ULTRASOUND GUIDANCE;  Surgeon: Megan Salon, MD;  Location: [facility];  Service: Gynecology;  Laterality: N/A;  . UPPER GI ENDOSCOPY    . uterine ablation  2007    No Known Allergies  Social History   Socioeconomic History  . Marital status: Single    Spouse name: Not on file  . Number of children: 0  . Years of education: Not on file  . Highest education level: Not on file  Occupational History  . Occupation: Programmer, multimedia: [employer]  . Financial resource strain: Not on file  . Food insecurity:    Worry: Not on file    Inability: Not on file  . Transportation needs:    Medical: Not on file    Non-medical: Not on file  Tobacco Use  . Smoking status:  Former Smoker    Packs/day: 0.10    Years: 3.00    Pack years: 0.30    Types: Cigarettes    Last attempt to quit: 10/02/1978    Years since quitting: 39.3  . Smokeless tobacco: Never Used  Substance and Sexual Activity  . Alcohol use: Yes    Alcohol/week: 4.2 oz    Types: 7 Glasses of wine per week    Comment: per week  . Drug use: No  . Sexual activity: Not Currently    Birth control/protection: Post-menopausal  Lifestyle  . Physical activity:    Days per week: Not on file    Minutes per session: Not on file  . Stress: Not on file  Relationships  . Social connections:    Talks on phone: Not on file    Gets together: Not on file    Attends religious service: Not on file    Active member of club or organization: Not on file    Attends meetings of clubs or organizations: Not on file     Relationship status: Not on file  . Intimate partner violence:    Fear of current or ex partner: Not on file    Emotionally abused: Not on file    Physically abused: Not on file    Forced sexual activity: Not on file  Other Topics Concern  . Not on file  Social History Narrative  . Not on file    Family History  Problem Relation Age of Onset  . Colon polyps Father   . Ulcerative colitis Father   . CAD Father        Stent placement at age 48  . Glaucoma Father   . Hypertension Father   . Colitis Father   . Breast cancer Paternal Grandmother   . Colon cancer Maternal Grandfather   . Colon polyps Mother   . Osteoporosis Mother   . Thyroid disease Mother        hypo  . Glaucoma Mother   . Diverticulosis Mother   . Sudden death Neg Hx   . Hyperlipidemia Neg Hx   . Heart attack Neg Hx   . Diabetes Neg Hx     BP 120/82   Ht 5\' 5"  (1.651 m)   Wt 145 lb (65.8 kg)   LMP 10/02/2006 (Approximate)   BMI 24.13 kg/m   Review of Systems: See HPI above.     Objective:  Physical Exam:  Gen: NAD, comfortable in exam room  Right ankle: No gross deformity, swelling, ecchymoses FROM with mild pain ER, plantarflexion Minimal TTP over 5th metatarsal base, ATFL.  No other tenderness. 1+ ant drawer and talar tilt.   Negative syndesmotic compression. Thompsons test negative. NV intact distally.  MSK u/s right foot:  Cortical irregularity base 5th metatarsal, less edema and vascularity.  Some bridging.  Assessment & Plan:  1. Right foot/ankle injury - healing 5th metatarsal avulsion fracture and lateral ankle sprain.  Continue boot for 2 more weeks then switch to supportive shoes.  Start theraband strengthening then.  Wait about 4 weeks before return to running - follow up then.  Tylenol motrin if needed.

## 2018-02-04 DIAGNOSIS — F411 Generalized anxiety disorder: Secondary | ICD-10-CM | POA: Diagnosis not present

## 2018-02-04 DIAGNOSIS — Z1159 Encounter for screening for other viral diseases: Secondary | ICD-10-CM | POA: Diagnosis not present

## 2018-02-04 DIAGNOSIS — E559 Vitamin D deficiency, unspecified: Secondary | ICD-10-CM | POA: Diagnosis not present

## 2018-02-04 DIAGNOSIS — Z23 Encounter for immunization: Secondary | ICD-10-CM | POA: Diagnosis not present

## 2018-02-04 DIAGNOSIS — K589 Irritable bowel syndrome without diarrhea: Secondary | ICD-10-CM | POA: Diagnosis not present

## 2018-02-04 DIAGNOSIS — F331 Major depressive disorder, recurrent, moderate: Secondary | ICD-10-CM | POA: Diagnosis not present

## 2018-02-04 DIAGNOSIS — J309 Allergic rhinitis, unspecified: Secondary | ICD-10-CM | POA: Diagnosis not present

## 2018-02-04 DIAGNOSIS — K219 Gastro-esophageal reflux disease without esophagitis: Secondary | ICD-10-CM | POA: Diagnosis not present

## 2018-02-04 DIAGNOSIS — Z6824 Body mass index (BMI) 24.0-24.9, adult: Secondary | ICD-10-CM | POA: Diagnosis not present

## 2018-02-05 MED FILL — CITALOPRAM HBR 20 MG TABLET: 20 | 90 days supply | Qty: 90 | Fill #1

## 2018-02-27 ENCOUNTER — Encounter: Payer: Self-pay | Admitting: Family Medicine

## 2018-02-27 ENCOUNTER — Ambulatory Visit: Payer: 59 | Admitting: Family Medicine

## 2018-02-27 DIAGNOSIS — S99911D Unspecified injury of right ankle, subsequent encounter: Secondary | ICD-10-CM

## 2018-02-27 NOTE — Patient Instructions (Signed)
Advance to using the red theraband with your strengthening exercises. Calf raises as we discussed as well.  All exercises 3 sets of 10 once a day. Wear supportive shoes for 2 more weeks. When you return to running over the next 1-2 weeks do the walk: jog progression we discussed (I.e. Every other day increasing; 1:1 jog: walk for 10 minutes then 2:1 for 15 minutes, 3:1 for 20 minutes, etc until you meet your goal). Ok to cross train with cycling, swimming, walking on off days. I think your orthotics will last you another 9-12 months. Follow up with me as needed but call me if you have any problems or concerns.

## 2018-03-04 ENCOUNTER — Encounter: Payer: Self-pay | Admitting: Family Medicine

## 2018-03-04 DIAGNOSIS — Z23 Encounter for immunization: Secondary | ICD-10-CM | POA: Diagnosis not present

## 2018-03-04 NOTE — Assessment & Plan Note (Signed)
2/2 healing 5th metatarsal avulsion fracture, lateral ankle sprain.  Much improved.  Advance strengthening - shown how to do so.  Supportive shoes for 2 more weeks.  Reviewed return to running program to start in next 1-2 weeks.  Cross train on off days.  Orthotics.  F/u prn.

## 2018-03-04 NOTE — Progress Notes (Signed)
PCP: Fanny Bien, MD  Subjective:   HPI: Patient is a 62 y.o. female here for right ankle injury.  4/10: Patient reports on 4/2 she was stepping off a curb and badly inverted her right ankle. Immediate pain, swelling laterally but could bear weight. Has prior sprain years ago and possible fracture. Has been icing, using ASO, voltaren gel, taking tylenol. Pain is 1-2/10 currently lateral ankle and foot, sharp. Worse with walking. + bruising but no other skin changes. No numbness.  5/1: Patient reports she's doing well. She has been icing, wearing boot except when driving. Wearing ASO under the boot. Minimal soreness lateral foot. No skin changes, numbness.  5/29: Patient reports she's doing well. Doing home exercises with yellow theraband. Was achy and swollen on Sunday. Wearing ASO some. Not yet running but is walking. Tylenol, motrin if needed. No skin changes, numbness. She brought in her custom orthotics - they look like they have another 9-12 months life left in them  Past Medical History:  Diagnosis Date  . Colon polyp   . Endometrial polyp 8/08   benign  . History of hiatal hernia   . IBS (irritable bowel syndrome)   . Menopause   . Migraine   . Pneumonia   . Sinusitis, chronic   . Skene's gland abscess 2009   patient unaware    Current Outpatient Medications on File Prior to Visit  Medication Sig Dispense Refill  . acetaminophen (TYLENOL) 500 MG tablet Take 500 mg by mouth every 8 (eight) hours as needed for mild pain.    . citalopram (CELEXA) 20 MG tablet TAKE 1 TABLET BY MOUTH ONCE DAILY (Patient taking differently: TAKE 1 TABLET BY MOUTH ONCE IN THE EVENING) 90 tablet 3  . diclofenac sodium (VOLTAREN) 1 % GEL APPLY 2 TO 4 GRAMS TOPICALLY TO AFFECTED AREA 4 TIMES PER DAY WHEN NEEDED  3  . Multiple Vitamins-Minerals (MULTI FOR HER 50+) TABS Take 1 tablet by mouth daily.      . Probiotic Product (PROBIOTIC PO) Take 1 capsule by mouth daily. Called  ultra spectrum    . ranitidine (ZANTAC) 150 MG tablet     . vitamin E 400 UNIT capsule Take 400 Units by mouth daily.    Marland Kitchen omeprazole (PRILOSEC) 20 MG capsule TAKE 1 TABLET (20 MG) BY MOUTH ONCE DAILY, TWICE A DAY IF NEEDED  2   No current facility-administered medications on file prior to visit.     Past Surgical History:  Procedure Laterality Date  . COLONOSCOPY    . DILATION AND CURETTAGE OF UTERUS  8/08  . GYNECOLOGIC CRYOSURGERY  1980  . HYSTEROSCOPY W/D&C N/A 03/30/2017   Procedure: DILATATION & CURETTAGE/HYSTEROSCOPY WITH ULTRASOUND GUIDANCE;  Surgeon: Megan Salon, MD;  Location: [facility];  Service: Gynecology;  Laterality: N/A;  . UPPER GI ENDOSCOPY    . uterine ablation  2007    No Known Allergies  Social History   Socioeconomic History  . Marital status: Single    Spouse name: Not on file  . Number of children: 0  . Years of education: Not on file  . Highest education level: Not on file  Occupational History  . Occupation: Programmer, multimedia: [employer]  . Financial resource strain: Not on file  . Food insecurity:    Worry: Not on file    Inability: Not on file  . Transportation needs:    Medical: Not on file    Non-medical: Not  on file  Tobacco Use  . Smoking status: Former Smoker    Packs/day: 0.10    Years: 3.00    Pack years: 0.30    Types: Cigarettes    Last attempt to quit: 10/02/1978    Years since quitting: 39.4  . Smokeless tobacco: Never Used  Substance and Sexual Activity  . Alcohol use: Yes    Alcohol/week: 4.2 oz    Types: 7 Glasses of wine per week    Comment: per week  . Drug use: No  . Sexual activity: Not Currently    Birth control/protection: Post-menopausal  Lifestyle  . Physical activity:    Days per week: Not on file    Minutes per session: Not on file  . Stress: Not on file  Relationships  . Social connections:    Talks on phone: Not on file    Gets together: Not on file    Attends religious service: Not on  file    Active member of club or organization: Not on file    Attends meetings of clubs or organizations: Not on file    Relationship status: Not on file  . Intimate partner violence:    Fear of current or ex partner: Not on file    Emotionally abused: Not on file    Physically abused: Not on file    Forced sexual activity: Not on file  Other Topics Concern  . Not on file  Social History Narrative  . Not on file    Family History  Problem Relation Age of Onset  . Colon polyps Father   . Ulcerative colitis Father   . CAD Father        Stent placement at age 45  . Glaucoma Father   . Hypertension Father   . Colitis Father   . Breast cancer Paternal Grandmother   . Colon cancer Maternal Grandfather   . Colon polyps Mother   . Osteoporosis Mother   . Thyroid disease Mother        hypo  . Glaucoma Mother   . Diverticulosis Mother   . Sudden death Neg Hx   . Hyperlipidemia Neg Hx   . Heart attack Neg Hx   . Diabetes Neg Hx     BP 116/82   Ht 5\' 5"  (1.651 m)   Wt 145 lb (65.8 kg)   LMP 10/02/2006 (Approximate)   BMI 24.13 kg/m   Review of Systems: See HPI above.     Objective:  Physical Exam:  Gen: NAD, comfortable in exam room  Right ankle: No gross deformity, swelling, ecchymoses FROM with mild pain ER only. Minimal TTP over 5th metatarsal base.  No ATFL tenderness. Trace ant drawer and talar tilt.   Negative syndesmotic compression. Thompsons test negative. NV intact distally.  MSK u/s right foot:  Healing 5th metatarsal avulsion fracture, good bony bridging at site without neovascularity.  Assessment & Plan:  1. Right foot/ankle injury - 2/2 healing 5th metatarsal avulsion fracture, lateral ankle sprain.  Much improved.  Advance strengthening - shown how to do so.  Supportive shoes for 2 more weeks.  Reviewed return to running program to start in next 1-2 weeks.  Cross train on off days.  Orthotics.  F/u prn.

## 2018-03-18 MED FILL — raNITIdine HCL 150 MG TABS: 150 | 90 days supply | Qty: 180 | Fill #1

## 2018-04-02 ENCOUNTER — Other Ambulatory Visit (HOSPITAL_BASED_OUTPATIENT_CLINIC_OR_DEPARTMENT_OTHER): Payer: Self-pay | Admitting: Family Medicine

## 2018-04-02 DIAGNOSIS — Z1231 Encounter for screening mammogram for malignant neoplasm of breast: Secondary | ICD-10-CM

## 2018-04-16 ENCOUNTER — Ambulatory Visit (HOSPITAL_BASED_OUTPATIENT_CLINIC_OR_DEPARTMENT_OTHER)
Admission: RE | Admit: 2018-04-16 | Discharge: 2018-04-16 | Disposition: A | Discharge status: Home / Self Care | Payer: 59 | Source: Ambulatory Visit | Attending: Family Medicine | Admitting: Family Medicine

## 2018-04-16 DIAGNOSIS — Z1231 Encounter for screening mammogram for malignant neoplasm of breast: Secondary | ICD-10-CM | POA: Diagnosis not present

## 2018-04-29 MED FILL — CITALOPRAM HBR 20 MG TABLET: 20 | 90 days supply | Qty: 90 | Fill #0

## 2018-05-09 ENCOUNTER — Ambulatory Visit: Payer: 59 | Admitting: Family Medicine

## 2018-05-09 ENCOUNTER — Encounter: Payer: Self-pay | Admitting: Family Medicine

## 2018-05-09 VITALS — BP 116/68 | HR 56 | Ht 65.0 in | Wt 140.0 lb

## 2018-05-09 DIAGNOSIS — S99911D Unspecified injury of right ankle, subsequent encounter: Secondary | ICD-10-CM

## 2018-05-09 DIAGNOSIS — M7651 Patellar tendinitis, right knee: Secondary | ICD-10-CM | POA: Diagnosis not present

## 2018-05-09 DIAGNOSIS — M7652 Patellar tendinitis, left knee: Secondary | ICD-10-CM | POA: Diagnosis not present

## 2018-05-09 NOTE — Patient Instructions (Signed)
Continue theraband strengthening 3 sets of 10 once a day. Start physical therapy for 1 visit (you can go longer if you want) to learn more extensive home program for your bilateral patellar tendinitis  Activities as tolerated. When you return to running over the next 1-2 weeks do the walk: jog progression we discussed (I.e. Every other day increasing; 1:1 jog: walk for 10 minutes then 2:1 for 15 minutes, 3:1 for 20 minutes, etc until you meet your goal). Ok to cross train with cycling, swimming, walking on off days. I think your orthotics will last you another 9-12 months. Follow up with me as needed but call me if you have any problems or concerns.

## 2018-05-09 NOTE — Progress Notes (Signed)
PCP: Fanny Bien, MD  Subjective:   HPI: Patient is a 62 y.o. female here for right ankle injury.  4/10: Patient reports on 4/2 she was stepping off a curb and badly inverted her right ankle. Immediate pain, swelling laterally but could bear weight. Has prior sprain years ago and possible fracture. Has been icing, using ASO, voltaren gel, taking tylenol. Pain is 1-2/10 currently lateral ankle and foot, sharp. Worse with walking. + bruising but no other skin changes. No numbness.  5/1: Patient reports she's doing well. She has been icing, wearing boot except when driving. Wearing ASO under the boot. Minimal soreness lateral foot. No skin changes, numbness.  5/29: Patient reports she's doing well. Doing home exercises with yellow theraband. Was achy and swollen on Sunday. Wearing ASO some. Not yet running but is walking. Tylenol, motrin if needed. No skin changes, numbness. She brought in her custom orthotics - they look like they have another 9-12 months life left in them  8/8: Patient reports she's overall doing well. She's returned to cycling, bodypump, pilates. She has stiffness lateral ankle in the morning. A little soreness with bodypump class Walking about 1 to 1 1/4 hours without a problem. Has been doing some walk/jog but not a lot and doing ok. Pain currently 0/10. Not taking anything for this. No skin changes, new injuries.  Past Medical History:  Diagnosis Date  . Colon polyp   . Endometrial polyp 8/08   benign  . History of hiatal hernia   . IBS (irritable bowel syndrome)   . Menopause   . Migraine   . Pneumonia   . Sinusitis, chronic   . Skene's gland abscess 2009   patient unaware    Current Outpatient Medications on File Prior to Visit  Medication Sig Dispense Refill  . acetaminophen (TYLENOL) 500 MG tablet Take 500 mg by mouth every 8 (eight) hours as needed for mild pain.    . citalopram (CELEXA) 20 MG tablet TAKE 1 TABLET BY MOUTH  ONCE DAILY (Patient taking differently: TAKE 1 TABLET BY MOUTH ONCE IN THE EVENING) 90 tablet 3  . diclofenac sodium (VOLTAREN) 1 % GEL APPLY 2 TO 4 GRAMS TOPICALLY TO AFFECTED AREA 4 TIMES PER DAY WHEN NEEDED  3  . Multiple Vitamins-Minerals (MULTI FOR HER 50+) TABS Take 1 tablet by mouth daily.      Marland Kitchen omeprazole (PRILOSEC) 20 MG capsule TAKE 1 TABLET (20 MG) BY MOUTH ONCE DAILY, TWICE A DAY IF NEEDED  2  . Probiotic Product (PROBIOTIC PO) Take 1 capsule by mouth daily. Called ultra spectrum    . ranitidine (ZANTAC) 150 MG tablet     . vitamin E 400 UNIT capsule Take 400 Units by mouth daily.     No current facility-administered medications on file prior to visit.     Past Surgical History:  Procedure Laterality Date  . COLONOSCOPY    . DILATION AND CURETTAGE OF UTERUS  8/08  . GYNECOLOGIC CRYOSURGERY  1980  . HYSTEROSCOPY W/D&C N/A 03/30/2017   Procedure: DILATATION & CURETTAGE/HYSTEROSCOPY WITH ULTRASOUND GUIDANCE;  Surgeon: Megan Salon, MD;  Location: [facility];  Service: Gynecology;  Laterality: N/A;  . UPPER GI ENDOSCOPY    . uterine ablation  2007    No Known Allergies  Social History   Socioeconomic History  . Marital status: Single    Spouse name: Not on file  . Number of children: 0  . Years of education: Not on file  . Highest  education level: Not on file  Occupational History  . Occupation: Programmer, multimedia: [employer]  . Financial resource strain: Not on file  . Food insecurity:    Worry: Not on file    Inability: Not on file  . Transportation needs:    Medical: Not on file    Non-medical: Not on file  Tobacco Use  . Smoking status: Former Smoker    Packs/day: 0.10    Years: 3.00    Pack years: 0.30    Types: Cigarettes    Last attempt to quit: 10/02/1978    Years since quitting: 39.6  . Smokeless tobacco: Never Used  Substance and Sexual Activity  . Alcohol use: Yes    Alcohol/week: 7.0 standard drinks    Types: 7 Glasses of wine per week     Comment: per week  . Drug use: No  . Sexual activity: Not Currently    Birth control/protection: Post-menopausal  Lifestyle  . Physical activity:    Days per week: Not on file    Minutes per session: Not on file  . Stress: Not on file  Relationships  . Social connections:    Talks on phone: Not on file    Gets together: Not on file    Attends religious service: Not on file    Active member of club or organization: Not on file    Attends meetings of clubs or organizations: Not on file    Relationship status: Not on file  . Intimate partner violence:    Fear of current or ex partner: Not on file    Emotionally abused: Not on file    Physically abused: Not on file    Forced sexual activity: Not on file  Other Topics Concern  . Not on file  Social History Narrative  . Not on file    Family History  Problem Relation Age of Onset  . Colon polyps Father   . Ulcerative colitis Father   . CAD Father        Stent placement at age 50  . Glaucoma Father   . Hypertension Father   . Colitis Father   . Breast cancer Paternal Grandmother   . Colon cancer Maternal Grandfather   . Colon polyps Mother   . Osteoporosis Mother   . Thyroid disease Mother        hypo  . Glaucoma Mother   . Diverticulosis Mother   . Sudden death Neg Hx   . Hyperlipidemia Neg Hx   . Heart attack Neg Hx   . Diabetes Neg Hx     BP 116/68   Pulse (!) 56   Ht 5\' 5"  (1.651 m)   Wt 140 lb (63.5 kg)   LMP 10/02/2006 (Approximate)   BMI 23.30 kg/m   Review of Systems: See HPI above.     Objective:  Physical Exam:  Gen: NAD, comfortable in exam room  Right ankle/foot: No gross deformity, swelling, ecchymoses FROM without pain. TTP minimally over peroneal tendons and base 5th metatarsal. Negative ant drawer and talar tilt.   Negative syndesmotic compression. Thompsons test negative. NV intact distally.  MSK u/s right foot:  Still able to visualize a fracture line base 5th  metatarsal.  Assessment & Plan:  1. Right foot/ankle injury - 4 months out from her injury.  Overall she's doing well.  Fracture line still visible of 5th metatarsal avulsion fracture but minimal symptoms - consistent with fibrous nonunion.  Continue strengthening.  She will start physical therapy to get a more extensive home program for this and bilateral patellar tendinitis (mentioned this anterior knee pain also).  Discuss return to running program.  F/u prn.  Tylenol or ibuprofen if needed.

## 2018-05-22 ENCOUNTER — Other Ambulatory Visit: Payer: Self-pay

## 2018-05-22 ENCOUNTER — Encounter: Payer: Self-pay | Admitting: Physical Therapy

## 2018-05-22 ENCOUNTER — Ambulatory Visit: Payer: 59 | Attending: Family Medicine | Admitting: Physical Therapy

## 2018-05-22 DIAGNOSIS — G8929 Other chronic pain: Secondary | ICD-10-CM | POA: Diagnosis not present

## 2018-05-22 DIAGNOSIS — M25561 Pain in right knee: Secondary | ICD-10-CM | POA: Diagnosis not present

## 2018-05-22 DIAGNOSIS — M25562 Pain in left knee: Secondary | ICD-10-CM | POA: Diagnosis not present

## 2018-05-22 NOTE — Therapy (Addendum)
[place], [place], [address] Phone: [phone]   Fax:  [phone]  Physical Therapy Evaluation/Discharge   Patient Details  Name: Kara Warren MRN: [id number] Date of Birth: 1956-02-05 Referring Provider: Dr Karlton Lemon    Encounter Date: 05/22/2018  PT End of Session - 05/22/18 1435    Visit Number  1    Number of Visits  12    Date for PT Re-Evaluation  07/03/18    Authorization Type  [facility]     PT Start Time  9381    PT Stop Time  1500    PT Time Calculation (min)  45 min    Activity Tolerance  Patient tolerated treatment well    Behavior During Therapy  [facility] for tasks assessed/performed       Past Medical History:  Diagnosis Date  . Colon polyp   . Endometrial polyp 8/08   benign  . History of hiatal hernia   . IBS (irritable bowel syndrome)   . Menopause   . Migraine   . Pneumonia   . Sinusitis, chronic   . 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 COLONOSCOPY    . DILATION AND CURETTAGE OF UTERUS  8/08  . GYNECOLOGIC CRYOSURGERY  1980  . HYSTEROSCOPY W/D&C N/A 03/30/2017   Procedure: DILATATION & CURETTAGE/HYSTEROSCOPY WITH ULTRASOUND GUIDANCE;  Surgeon: Megan Salon, MD;  Location: [facility];  Service: Gynecology;  Laterality: N/A;  . UPPER GI ENDOSCOPY    . uterine ablation  2007    There were no vitals filed for this visit.   Subjective Assessment - 05/22/18 1423    Subjective  Patient reports that a few months ago she began having knee pain with activity. She had a prior ankle sprain a few months before that. She is very active. She has more pain with activity.     Pertinent History  R ankle sprain     Limitations  --   classes    Currently in Pain?  Yes    Pain Score  3     Pain Orientation  Right    Pain Descriptors / Indicators  Aching    Pain Type  Chronic pain    Pain Frequency  Intermittent    Aggravating Factors   going up and down stairs and exercise classes     Multiple Pain Sites  Yes    Pain Score  3    Pain Orientation  Left    Pain Descriptors / Indicators  Aching    Pain Type  Acute pain    Pain Onset  More than a month ago    Pain Frequency  Intermittent    Aggravating Factors   goin up and down stairs; classes          [facility] PT Assessment - 05/22/18 0001      Assessment   Medical Diagnosis  Bilateral Knee Pain     Referring Provider  Dr Karlton Lemon     Onset Date/Surgical Date  --   3 months    Hand Dominance  Right    Next MD Visit  Nothing Scheduled     Prior Therapy  Nothing       Precautions   Precautions  None      Restrictions   Weight Bearing Restrictions  No      Balance Screen   Has  the patient fallen in the past 6 months  No    Has the patient had a decrease in activity level because of a fear of falling?   No    Is the patient reluctant to leave their home because of a fear of falling?   No      Home Film/video editor residence    Additional Comments  3 flights of stairs in her house       Prior Function   Level of Independence  Independent    Vocation  Full time employment    Vocation Requirements  Conservation officer, historic buildings for [employer]     Leisure  Does Pilates and other exercise classes       Cognition   Overall Cognitive Status  Within Functional Limits for tasks assessed    Attention  Focused    Focused Attention  Appears intact    Memory  Appears intact    Awareness  Appears intact    Problem Solving  Appears intact      Sensation   Light Touch  Appears Intact    Additional Comments  denies parathesias       Coordination   Gross Motor Movements are Fluid and Coordinated  Yes    Fine Motor Movements are Fluid and Coordinated  Yes      Functional Tests   Functional tests  Squat;Step up;Sit to Stand;Single leg stance      Squat   Comments  good squat technique       Step Up   Comments  pain on the  right side wih step up       Single Leg Stance   Comments  normal left single leg stability       Sit to Stand   Comments  fair right leg stability       ROM / Strength   AROM / PROM / Strength  AROM;PROM;Strength      AROM   Overall AROM Comments  full pain free AROM       Strength   Overall Strength Comments  5/5 gross bilateral lower extremity strength                 Objective measurements completed on examination: See above findings.      [facility] PT Treatment/Exercise - 05/22/18 0001      Knee/Hip Exercises: Standing   Other Standing Knee Exercises  lateral band walk x5 each way       Knee/Hip Exercises: Supine   Bridges Limitations  2x10     Straight Leg Raises Limitations  x10 bilateral     Other Supine Knee/Hip Exercises  supine clamshell 2x10              PT Education - 05/22/18 1435    Education Details  Symptom mangement     Person(s) Educated  Patient    Methods  Explanation;Tactile cues;Demonstration;Verbal cues    Comprehension  Verbalized understanding;Returned demonstration;Verbal cues required;Tactile cues required;Need further instruction       PT Short Term Goals - 05/22/18 1605      PT SHORT TERM GOAL #1   Title  Patient will demsotrate a 30 sec single leg stance time bilateral     Time  3    Period  Weeks    Status  New    Target Date  06/12/18      PT SHORT TERM GOAL #2   Title  Patient will  demsotrate full right ankle DF without pain     Time  3    Period  Weeks    Status  New    Target Date  06/12/18      PT SHORT TERM GOAL #3   Title  Patient will be independnet with initial HEP     Time  3    Period  Weeks    Status  New    Target Date  06/12/18        PT Long Term Goals - 05/22/18 1606      PT LONG TERM GOAL #1   Title  Patient will return to all exercise classes without pain     Time  6    Period  Weeks    Status  New    Target Date  07/03/18      PT LONG TERM GOAL #2   Title  Patient will go  up/down 8 stairs without pain     Time  8    Period  Weeks    Status  New    Target Date  07/03/18             Plan - 05/22/18 1559    Clinical Impression Statement  Patient is a 62 year old female with bilateral patellar tendon pain when she does activity R> L. She presents with a slight decrease in right single leg stability compred to the left. She has good strength. She is lacking some active right ankle dorsi flexion from a previous right ankle fracture which may be contributing to her pain. She would benefit from skilled therapy to work on bilateral stability and ankle mobility.     History and Personal Factors relevant to plan of care:  right ankle fx     Clinical Presentation  Stable    Clinical Presentation due to:  pain that comes and goes depending on activity     Clinical Decision Making  Low    PT Home Exercise Plan  Review pilaties reformer exercises; review higher level single leg stability exercises. modalities if needed     Consulted and Agree with Plan of Care  Patient       Patient will benefit from skilled therapeutic intervention in order to improve the following deficits and impairments:     Visit Diagnosis: Chronic pain of right knee - Plan: PT plan of care cert/re-cert  Chronic pain of left knee - Plan: PT plan of care cert/re-cert     Problem List Patient Active Problem List   Diagnosis Date Noted  . Right ankle injury, subsequent encounter 01/11/2018  . Plantar wart 04/02/2014  . Peroneal tendinitis of right lower extremity 03/12/2014  . Left wrist pain 07/30/2013  . Left hip pain 07/30/2013  . Atypical chest pain 03/05/2013  . Diverticulosis 07/27/2012  . Family history of osteoporosis 12/28/2011  . Right foot pain 08/09/2011  . Sinusitis, chronic   . Colon polyp   . Menopause   . Migraine   . TRANSAMINASES, SERUM, ELEVATED 12/07/2009  . LUNG NODULE 11/19/2009  . Nonspecific (abnormal) findings on radiological and other examination of  body structure 11/19/2009  . [place] LUNG FIELD 11/19/2009  . RHINITIS 06/21/2009  . Acute sinusitis, unspecified 05/20/2009  . HIP PAIN, RIGHT 04/12/2009  . NEUTROPENIA UNSPECIFIED 10/07/2008  . HYPERLIPIDEMIA 08/25/2008  . DEPRESSION 08/25/2008  PHYSICAL THERAPY DISCHARGE SUMMARY  Visits from Start of Care1  Current functional level related to  goals / functional outcomes: Unknown    Remaining deficits: Unknown    Education / Equipment: Unknown    Plan: Patient agrees to discharge.  Patient goals were not met. Patient is being discharged due to not returning since the last visit.  ?????    =  Carney Living PT DPT  05/22/2018, 4:18 PM  [facility] [address] [place], [place], [address] Phone: [phone]   Fax:  [phone]  Name: Kara Warren MRN: [id number] Date of Birth: [birth year]

## 2018-05-30 DIAGNOSIS — H52203 Unspecified astigmatism, bilateral: Secondary | ICD-10-CM | POA: Diagnosis not present

## 2018-05-30 DIAGNOSIS — H5213 Myopia, bilateral: Secondary | ICD-10-CM | POA: Diagnosis not present

## 2018-05-31 ENCOUNTER — Encounter

## 2018-06-05 ENCOUNTER — Ambulatory Visit: Payer: 59 | Admitting: Physical Therapy

## 2018-06-06 MED FILL — raNITIdine HCL 150 MG TABS: 150 | 90 days supply | Qty: 180 | Fill #2

## 2018-06-14 DIAGNOSIS — Z23 Encounter for immunization: Secondary | ICD-10-CM | POA: Diagnosis not present

## 2018-06-17 DIAGNOSIS — B349 Viral infection, unspecified: Secondary | ICD-10-CM | POA: Diagnosis not present

## 2018-06-19 DIAGNOSIS — J069 Acute upper respiratory infection, unspecified: Secondary | ICD-10-CM | POA: Diagnosis not present

## 2018-06-19 DIAGNOSIS — Z6825 Body mass index (BMI) 25.0-25.9, adult: Secondary | ICD-10-CM | POA: Diagnosis not present

## 2018-07-23 MED FILL — CITALOPRAM HBR 20 MG TABLET: 20 | 90 days supply | Qty: 90 | Fill #1

## 2018-08-12 DIAGNOSIS — Z23 Encounter for immunization: Secondary | ICD-10-CM | POA: Diagnosis not present

## 2018-09-05 DIAGNOSIS — L821 Other seborrheic keratosis: Secondary | ICD-10-CM | POA: Diagnosis not present

## 2018-09-05 DIAGNOSIS — L309 Dermatitis, unspecified: Secondary | ICD-10-CM | POA: Diagnosis not present

## 2018-09-05 DIAGNOSIS — L819 Disorder of pigmentation, unspecified: Secondary | ICD-10-CM | POA: Diagnosis not present

## 2018-09-05 DIAGNOSIS — L814 Other melanin hyperpigmentation: Secondary | ICD-10-CM | POA: Diagnosis not present

## 2018-09-05 DIAGNOSIS — D229 Melanocytic nevi, unspecified: Secondary | ICD-10-CM | POA: Diagnosis not present

## 2018-09-05 MED FILL — HYDROCORTISONE BUTY 0.1% CR: 0.1 | 5 days supply | Qty: 15 | Fill #0

## 2018-09-20 MED FILL — FAMOTIDINE 20 MG TABLET: 20 | 30 days supply | Qty: 120 | Fill #0

## 2018-10-16 DIAGNOSIS — E559 Vitamin D deficiency, unspecified: Secondary | ICD-10-CM | POA: Diagnosis not present

## 2018-10-16 DIAGNOSIS — Z0000 Encounter for general adult medical examination without abnormal findings: Secondary | ICD-10-CM | POA: Diagnosis not present

## 2018-10-21 DIAGNOSIS — Z6825 Body mass index (BMI) 25.0-25.9, adult: Secondary | ICD-10-CM | POA: Diagnosis not present

## 2018-10-21 DIAGNOSIS — Z1211 Encounter for screening for malignant neoplasm of colon: Secondary | ICD-10-CM | POA: Diagnosis not present

## 2018-10-21 DIAGNOSIS — Z0000 Encounter for general adult medical examination without abnormal findings: Secondary | ICD-10-CM | POA: Diagnosis not present

## 2018-10-24 MED FILL — CITALOPRAM HBR 20 MG TABLET: 20 | 90 days supply | Qty: 90 | Fill #2

## 2018-10-29 DIAGNOSIS — R002 Palpitations: Secondary | ICD-10-CM | POA: Diagnosis not present

## 2018-10-29 DIAGNOSIS — F411 Generalized anxiety disorder: Secondary | ICD-10-CM | POA: Diagnosis not present

## 2018-10-29 DIAGNOSIS — J069 Acute upper respiratory infection, unspecified: Secondary | ICD-10-CM | POA: Diagnosis not present

## 2018-10-29 DIAGNOSIS — Z6824 Body mass index (BMI) 24.0-24.9, adult: Secondary | ICD-10-CM | POA: Diagnosis not present

## 2018-11-06 DIAGNOSIS — R002 Palpitations: Secondary | ICD-10-CM | POA: Diagnosis not present

## 2018-11-06 DIAGNOSIS — Z6825 Body mass index (BMI) 25.0-25.9, adult: Secondary | ICD-10-CM | POA: Diagnosis not present

## 2018-11-06 DIAGNOSIS — J069 Acute upper respiratory infection, unspecified: Secondary | ICD-10-CM | POA: Diagnosis not present

## 2018-11-06 DIAGNOSIS — J309 Allergic rhinitis, unspecified: Secondary | ICD-10-CM | POA: Diagnosis not present

## 2018-11-06 MED FILL — MONTELUKAST SOD 10 MG TAB: 10 | 90 days supply | Qty: 90 | Fill #0

## 2018-12-10 ENCOUNTER — Ambulatory Visit: Payer: 59 | Admitting: Family Medicine

## 2018-12-10 VITALS — BP 122/80 | Ht 65.0 in | Wt 140.0 lb

## 2018-12-10 DIAGNOSIS — S39012A Strain of muscle, fascia and tendon of lower back, initial encounter: Secondary | ICD-10-CM | POA: Diagnosis not present

## 2018-12-10 NOTE — Patient Instructions (Signed)
You have a lumbar strain. Ok to take tylenol for baseline pain relief (1-2 extra strength tabs 3x/day) Topical salon pas patches, aspercreme may help as well. Take ibuprofen or aleve only if needed. Stay as active as possible. Do home exercises and stretches as directed - hold each for 20-30 seconds and do each one three times. Start physical therapy and do home exercises on days you don't go to therapy. Strengthening of low back muscles, abdominal musculature are key for long term pain relief. Follow up with me in 1 month or as needed if you're doing well.

## 2018-12-10 NOTE — Progress Notes (Signed)
   Subjective:    Patient ID: Kara Warren, female    DOB: 10-20-1955, 63 y.o.   MRN: [id number]  Patient here after experiencing low back for 2.5 weeks. States there was no inciting trauma, she woke with it one morning. The pain was initially in the middle of her back but has radiated out bilaterally on either side of her low back. She has continued to do her walking and pilates and yoga, but has stopped cycling as she was worried it may exacerbate her symptoms. She has tried tylenol and ibuprofen, as well as massage therapy and suing a foam roller. Overall reports her pain has improved since it started a few weeks ago. Denies any numbness, tingling or bowel/bladder incontinence.   ROS per HPI     Objective:   Physical Exam  Gen: NAD, comfortable in exam room   Back: No gross deformity, scoliosis. No TTP back or hip.  No midline or bony TTP. FROM with pain on hip flexion Strength LEs 5/5 all muscle groups.   Negative SLRs. Sensation intact to light touch bilaterally. Negative straight leg test  Bilateral hips: No deformity. FROM with 5/5 strength. No tenderness to palpation. NVI distally. Negative fabers, fadir, piriformis stretches.    Assessment & Plan:  Lumbar strain  Patient with likely strain to erector spinae muscles in lower back. No red flag symptoms. She is improving with conservative measures. Will refer to physical therapy for exercises and stretches to help with pain. She may use tylenol, naproxen or ibuprofen as needed for pain. She is to return in 4 weeks if she is continuing to have pain.   [place] Everleigh Colclasure, DO PGY-2, [facility]

## 2018-12-11 ENCOUNTER — Encounter: Payer: Self-pay | Admitting: Family Medicine

## 2018-12-11 MED FILL — FAMOTIDINE 20 MG TABLET: 20 | 30 days supply | Qty: 120 | Fill #1

## 2018-12-25 ENCOUNTER — Ambulatory Visit: Payer: 59 | Admitting: Physical Therapy

## 2019-01-02 DIAGNOSIS — R002 Palpitations: Secondary | ICD-10-CM | POA: Diagnosis not present

## 2019-01-02 DIAGNOSIS — F331 Major depressive disorder, recurrent, moderate: Secondary | ICD-10-CM | POA: Diagnosis not present

## 2019-01-02 DIAGNOSIS — F411 Generalized anxiety disorder: Secondary | ICD-10-CM | POA: Diagnosis not present

## 2019-01-02 DIAGNOSIS — K219 Gastro-esophageal reflux disease without esophagitis: Secondary | ICD-10-CM | POA: Diagnosis not present

## 2019-01-02 DIAGNOSIS — J309 Allergic rhinitis, unspecified: Secondary | ICD-10-CM | POA: Diagnosis not present

## 2019-01-13 MED FILL — CITALOPRAM HBR 20 MG TABLET: 20 | 90 days supply | Qty: 90 | Fill #0

## 2019-02-04 MED FILL — AZELASTINE HCL 137 MCG SPRY: 0.1 | 30 days supply | Qty: 30 | Fill #0

## 2019-03-03 MED FILL — HYDROCORTISONE BUTY 0.1% CR: 0.1 | 5 days supply | Qty: 15 | Fill #1

## 2019-03-13 ENCOUNTER — Other Ambulatory Visit (HOSPITAL_BASED_OUTPATIENT_CLINIC_OR_DEPARTMENT_OTHER): Payer: Self-pay | Admitting: Family Medicine

## 2019-03-13 DIAGNOSIS — Z1231 Encounter for screening mammogram for malignant neoplasm of breast: Secondary | ICD-10-CM

## 2019-03-27 ENCOUNTER — Other Ambulatory Visit: Payer: Self-pay

## 2019-03-27 ENCOUNTER — Ambulatory Visit: Payer: 59 | Admitting: Family Medicine

## 2019-03-27 VITALS — BP 130/78 | Ht 65.0 in | Wt 140.0 lb

## 2019-03-27 DIAGNOSIS — M79671 Pain in right foot: Secondary | ICD-10-CM

## 2019-03-27 NOTE — Patient Instructions (Signed)
You have plantar fasciitis with an element of achilles tendinopathy. Take tylenol and/or aleve as needed for pain  Plantar fascia stretch for 20-30 seconds (do 3 of these) in morning Lowering/raise on a step exercises 3 x 10 once or twice a day - this is very important for long term recovery. Can add heel walks, toe walks forward and backward as well Ice heel for 15 minutes as needed. Avoid flat shoes/barefoot walking as much as possible. Wear the orthotics when up and walking around. Physical therapy is also an option. Follow up with me in 6 weeks or as needed if you're doing well.

## 2019-03-28 ENCOUNTER — Encounter: Payer: Self-pay | Admitting: Family Medicine

## 2019-03-28 NOTE — Progress Notes (Signed)
PCP: Fanny Bien, MD  Subjective:   HPI: Patient is a 63 y.o. female here for right heel pain.  Patient returns for new pair of orthotics but also having right heel pain past week. Pain plantar but has progressed to include achilles and into calf. No acute injury or trauma. Pain is sharp, worse with walking. No skin changes, numbness.  Past Medical History:  Diagnosis Date  . Colon polyp   . Endometrial polyp 8/08   benign  . History of hiatal hernia   . IBS (irritable bowel syndrome)   . Menopause   . Migraine   . Pneumonia   . Sinusitis, chronic   . Skene's gland abscess 2009   patient unaware    Current Outpatient Medications on File Prior to Visit  Medication Sig Dispense Refill  . acetaminophen (TYLENOL) 500 MG tablet Take 500 mg by mouth every 8 (eight) hours as needed for mild pain.    Marland Kitchen azelastine (ASTELIN) 0.1 % nasal spray     . citalopram (CELEXA) 20 MG tablet TAKE 1 TABLET BY MOUTH ONCE DAILY (Patient taking differently: TAKE 1 TABLET BY MOUTH ONCE IN THE EVENING) 90 tablet 3  . diclofenac sodium (VOLTAREN) 1 % GEL APPLY 2 TO 4 GRAMS TOPICALLY TO AFFECTED AREA 4 TIMES PER DAY WHEN NEEDED  3  . famotidine (PEPCID) 20 MG tablet     . hydrocortisone butyrate (LUCOID) 0.1 % CREA cream     . montelukast (SINGULAIR) 10 MG tablet     . Multiple Vitamins-Minerals (MULTI FOR HER 50+) TABS Take 1 tablet by mouth daily.      Marland Kitchen omeprazole (PRILOSEC) 20 MG capsule TAKE 1 TABLET (20 MG) BY MOUTH ONCE DAILY, TWICE A DAY IF NEEDED  2  . Probiotic Product (PROBIOTIC PO) Take 1 capsule by mouth daily. Called ultra spectrum    . ranitidine (ZANTAC) 150 MG tablet     . vitamin E 400 UNIT capsule Take 400 Units by mouth daily.     No current facility-administered medications on file prior to visit.     Past Surgical History:  Procedure Laterality Date  . COLONOSCOPY    . DILATION AND CURETTAGE OF UTERUS  8/08  . GYNECOLOGIC CRYOSURGERY  1980  . HYSTEROSCOPY W/D&C N/A  03/30/2017   Procedure: DILATATION & CURETTAGE/HYSTEROSCOPY WITH ULTRASOUND GUIDANCE;  Surgeon: Megan Salon, MD;  Location: [facility];  Service: Gynecology;  Laterality: N/A;  . UPPER GI ENDOSCOPY    . uterine ablation  2007    No Known Allergies  Social History   Socioeconomic History  . Marital status: Single    Spouse name: Not on file  . Number of children: 0  . Years of education: Not on file  . Highest education level: Not on file  Occupational History  . Occupation: Programmer, multimedia: [employer]  . Financial resource strain: Not on file  . Food insecurity    Worry: Not on file    Inability: Not on file  . Transportation needs    Medical: Not on file    Non-medical: Not on file  Tobacco Use  . Smoking status: Former Smoker    Packs/day: 0.10    Years: 3.00    Pack years: 0.30    Types: Cigarettes    Quit date: 10/02/1978    Years since quitting: 40.5  . Smokeless tobacco: Never Used  Substance and Sexual Activity  . Alcohol use: Yes  Alcohol/week: 7.0 standard drinks    Types: 7 Glasses of wine per week    Comment: per week  . Drug use: No  . Sexual activity: Not Currently    Birth control/protection: Post-menopausal  Lifestyle  . Physical activity    Days per week: Not on file    Minutes per session: Not on file  . Stress: Not on file  Relationships  . Social Herbalist on phone: Not on file    Gets together: Not on file    Attends religious service: Not on file    Active member of club or organization: Not on file    Attends meetings of clubs or organizations: Not on file    Relationship status: Not on file  . Intimate partner violence    Fear of current or ex partner: Not on file    Emotionally abused: Not on file    Physically abused: Not on file    Forced sexual activity: Not on file  Other Topics Concern  . Not on file  Social History Narrative  . Not on file    Family History  Problem Relation Age of Onset  .  Colon polyps Father   . Ulcerative colitis Father   . CAD Father        Stent placement at age 53  . Glaucoma Father   . Hypertension Father   . Colitis Father   . Breast cancer Paternal Grandmother   . Colon cancer Maternal Grandfather   . Colon polyps Mother   . Osteoporosis Mother   . Thyroid disease Mother        hypo  . Glaucoma Mother   . Diverticulosis Mother   . Sudden death Neg Hx   . Hyperlipidemia Neg Hx   . Heart attack Neg Hx   . Diabetes Neg Hx     BP 130/78   Ht 5\' 5"  (1.651 m)   Wt 140 lb (63.5 kg)   LMP 10/02/2006 (Approximate)   BMI 23.30 kg/m   Review of Systems: See HPI above.     Objective:  Physical Exam:  Gen: NAD, comfortable in exam room  Right foot/ankle: Mod pronation.  No gross deformity, swelling, ecchymoses FROM with 5/5 strength all directions. TTP proximal plantar fascia and distal achilles.  No other tenderness. Negative calcaneal squeeze. Negative ant drawer and talar tilt.   Thompsons test negative. NV intact distally.  Left foot/ankle: Mod pronation.  No deformity. FROM with 5/5 strength. No tenderness to palpation. NVI distally. Leg lengths equal. No abnormalities of gait.  Assessment & Plan:  1. Right heel pain - 2/2 plantar fasciitis with achilles tendinopathy.  Shown home exercises and stretches to do daily.  Tylenol and/or aleve.  New orthotics made today.  Consider physical therapy.  F/u in 6 weeks or as needed.  Patient was fitted for a : standard, cushioned, semi-rigid orthotic. The orthotic was heated and afterward the patient stood on the orthotic blank positioned on the orthotic stand. The patient was positioned in subtalar neutral position and 10 degrees of ankle dorsiflexion in a weight bearing stance. After completion of molding, a stable base was applied to the orthotic blank. The blank was ground to a stable position for weight bearing. Size: 8 red cambray Base: blue med density eva Posting:  none Additional orthotic padding: none Total visit time 35 minutes - > 50% of which spent on counseling, answering questions.

## 2019-04-21 ENCOUNTER — Other Ambulatory Visit: Payer: Self-pay

## 2019-04-21 ENCOUNTER — Ambulatory Visit (HOSPITAL_BASED_OUTPATIENT_CLINIC_OR_DEPARTMENT_OTHER)
Admission: RE | Admit: 2019-04-21 | Discharge: 2019-04-21 | Disposition: A | Discharge status: Home / Self Care | Payer: 59 | Source: Ambulatory Visit | Attending: Family Medicine | Admitting: Family Medicine

## 2019-04-21 DIAGNOSIS — Z1231 Encounter for screening mammogram for malignant neoplasm of breast: Secondary | ICD-10-CM | POA: Insufficient documentation

## 2019-04-21 MED FILL — CITALOPRAM HBR 20 MG TABLET: 20 | 90 days supply | Qty: 90 | Fill #0

## 2019-04-21 MED FILL — FAMOTIDINE 40 MG TABLET: 40 | 90 days supply | Qty: 180 | Fill #0

## 2019-06-05 MED FILL — AMOXICILLIN 500 MG CAPSULE: 500 | 7 days supply | Qty: 21 | Fill #0

## 2019-07-02 ENCOUNTER — Other Ambulatory Visit: Payer: Self-pay

## 2019-07-02 ENCOUNTER — Ambulatory Visit (INDEPENDENT_AMBULATORY_CARE_PROVIDER_SITE_OTHER): Payer: 59 | Admitting: Family Medicine

## 2019-07-02 ENCOUNTER — Encounter: Payer: Self-pay | Admitting: Family Medicine

## 2019-07-02 VITALS — BP 128/80 | Ht 65.0 in | Wt 140.0 lb

## 2019-07-02 DIAGNOSIS — M62838 Other muscle spasm: Secondary | ICD-10-CM | POA: Diagnosis not present

## 2019-07-02 DIAGNOSIS — M542 Cervicalgia: Secondary | ICD-10-CM | POA: Diagnosis not present

## 2019-07-02 HISTORY — DX: Cervicalgia: M54.2

## 2019-07-02 MED ORDER — PREDNISONE 10 MG PO TABS: ORAL_TABLET | ORAL | 0 refills | Status: DC

## 2019-07-02 MED FILL — predniSONE 10 MG TABS: 10 | 6 days supply | Qty: 21 | Fill #0

## 2019-07-02 NOTE — Progress Notes (Signed)
PCP: Fanny Bien, MD  Subjective:   CC: Patient is a 63 y.o. female here for new onset right shoulder and neck pain.  HPI: Ms. Hohlt is a 63 year old female presenting with about 1 month of new onset right shoulder and neck pain.  Patient denies having any initial injury or activity change that may have precipitated the issue.  The pain is described as a throbbing achy pain which involves the entire shoulder and has radiation to the upper arm and the jaw today.  Patient has tried massaging and stretching which has not improved the pain.  She denies having any numbness, weakness, tingling of her right arm, skin changes.  The pain is exacerbated by looking down, turning her head to the right or pulling of her arm.  Patient has not altered her activity level since the pain began but does note that she had decreased her activity level early in the pandemic season due to gyms being closed.  Patient denies pain waking her up from sleep and denies discomfort with sleeping positions.  ROS: See HPI above. I have personally reviewed pertinent past medical history, surgical, family, and social history as appropriate.     Objective:  BP 128/80   Ht 5\' 5"  (1.651 m)   Wt 140 lb (63.5 kg)   LMP 10/02/2006 (Approximate)   BMI 23.30 kg/m   Physical Exam: Gen: NAD, comfortable in exam room Right shoulder: Observation: Negative for deformity, erythema, edema, instability Palpation: Positive for tenderness on anterior shoulder, negative for tenderness elsewhere. ROM: FROM. Strength: Patient has good strength of rotator cuff musculature and biceps and triceps.  Patient had some ulnar distribution weakness of the left hand. Sensation: DTRs were symmetrical bilaterally and WNL Special tests: Negative empty can test, negative full can test  Cervical spine: Observation: Negative for erythema, malrotation, deformity, instability. Palpation: Negative for tenderness along spinous process or paraspinal  musculature, negative for crepitus or step-offs ROM: Decreased range of motion with right side bending and right rotation by approximately 10% worse than left Strength: Has good strength with neck range of motion, bilateral upper extremities. Sensation: Intact Special tests: Negative Spurling on right   Assessment & Plan:  Acute neck pain Patient has acute neck pain likely secondary to muscle strain with possible nerve impingement component but would be abnormal presentation. -6-day steroid pack -Use additional Tylenol as needed for pain on top of the steroids and can use anti-inflammatories after the steroid course. -Attempt to avoid exacerbating activities and positions -Provided with prescription for formal physical therapy -Can consider muscle relaxants if initial measures are not improving the discomfort -Return in 4 weeks for reassessment  I independently examined pertinent imaging in relation to cc.  Doristine Mango, [facility] PGY-2

## 2019-07-02 NOTE — Patient Instructions (Signed)
You have strain/spasms of your trapezius, less likely cervical spine nerve irritation. Prednisone 6 day dose pack to relieve irritation/inflammation of the nerve. Aleve 2 tabs twice a day with food for pain and inflammation - start day AFTER finishing prednisone. Consider baclofen three times a day as needed for muscle spasms. Consider cervical collar if severely painful. Simple range of motion exercises within limits of pain to prevent further stiffness. Start physical therapy for stretching, exercises, traction, and modalities. Heat 15 minutes at a time 3-4 times a day to help with spasms. Watch head position when on computers, texting, when sleeping in bed - should in line with back to prevent further spasms. Consider home traction unit if you get benefit with this in physical therapy. If not improving we will consider an MRI. Follow up with me in 1 month.

## 2019-07-02 NOTE — Assessment & Plan Note (Signed)
Patient has acute neck pain likely secondary to muscle strain with possible nerve impingement component but would be abnormal presentation. -6-day steroid pack -Use additional Tylenol as needed for pain on top of the steroids and can use anti-inflammatories after the steroid course. -Attempt to avoid exacerbating activities and positions -Provided with prescription for formal physical therapy -Can consider muscle relaxants if initial measures are not improving the discomfort -Return in 4 weeks for reassessment

## 2019-07-08 DIAGNOSIS — M542 Cervicalgia: Secondary | ICD-10-CM | POA: Diagnosis not present

## 2019-07-08 DIAGNOSIS — F411 Generalized anxiety disorder: Secondary | ICD-10-CM | POA: Diagnosis not present

## 2019-07-08 DIAGNOSIS — R002 Palpitations: Secondary | ICD-10-CM | POA: Diagnosis not present

## 2019-07-14 ENCOUNTER — Encounter: Payer: Self-pay | Admitting: Physical Therapy

## 2019-07-14 ENCOUNTER — Ambulatory Visit: Payer: 59 | Attending: Family Medicine | Admitting: Physical Therapy

## 2019-07-14 ENCOUNTER — Other Ambulatory Visit: Payer: Self-pay

## 2019-07-14 DIAGNOSIS — M62838 Other muscle spasm: Secondary | ICD-10-CM | POA: Diagnosis not present

## 2019-07-14 DIAGNOSIS — M545 Low back pain: Secondary | ICD-10-CM | POA: Diagnosis not present

## 2019-07-14 DIAGNOSIS — M542 Cervicalgia: Secondary | ICD-10-CM | POA: Insufficient documentation

## 2019-07-14 NOTE — Patient Instructions (Signed)

## 2019-07-14 NOTE — Therapy (Signed)
[place], [place], [address] Phone: [phone]   Fax:  [phone]  Physical Therapy Evaluation  Patient Details  Name: Kara Warren MRN: [id number] Date of Birth: 1956-08-09 Referring Provider (PT): Karlton Lemon, MD   Encounter Date: 07/14/2019  PT End of Session - 07/14/19 1257    Visit Number  1    Number of Visits  10    Date for PT Re-Evaluation  08/25/19    Authorization Type  [facility] [employer]    PT Start Time  [id number]    PT Stop Time  1240    PT Time Calculation (min)  55 min    Activity Tolerance  Patient tolerated treatment well    Behavior During Therapy  [facility] for tasks assessed/performed       Past Medical History:  Diagnosis Date  . Colon polyp   . Endometrial polyp 8/08   benign  . History of hiatal hernia   . IBS (irritable bowel syndrome)   . Menopause   . Migraine   . Pneumonia   . Sinusitis, chronic   . 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 COLONOSCOPY    . DILATION AND CURETTAGE OF UTERUS  8/08  . GYNECOLOGIC CRYOSURGERY  1980  . HYSTEROSCOPY W/D&C N/A 03/30/2017   Procedure: DILATATION & CURETTAGE/HYSTEROSCOPY WITH ULTRASOUND GUIDANCE;  Surgeon: Megan Salon, MD;  Location: [facility];  Service: Gynecology;  Laterality: N/A;  . UPPER GI ENDOSCOPY    . uterine ablation  2007    There were no vitals filed for this visit.   Subjective Assessment - 07/14/19 1148    Subjective  Pt. with history of chronic neck muscle region tension with history DDD reports onset right upper trapezius region pain about 3 weeks ago. No mechanism of injury noted. Pain extends down toward shoulder and elbow region. No UE parasthesias noted or further distal pain/symptoms noted. She was prescribed dosepak but reports stopped taper early due to side effects with heart palpitations-she reports followed up with PCP and has been taking muscle  relaxer which has helped.    Pertinent History  per pt. history chronic neck tension otherwise no significant related PMH    Limitations  Sitting;Lifting;House hold activities    Diagnostic tests  no current imaging    Patient Stated Goals  improve pain    Currently in Pain?  Yes    Pain Score  2     Pain Location  Neck    Pain Orientation  Right    Pain Descriptors / Indicators  Tightness;Aching    Pain Type  Acute pain    Pain Onset  1 to 4 weeks ago    Pain Frequency  Intermittent    Aggravating Factors   sitting, carrying/lifing    Pain Relieving Factors  medication    Effect of Pain on Daily Activities  limits positional tolerance and ability for IADLs involving carrying and lifting activities         [facility] PT Assessment - 07/14/19 0001      Assessment   Medical Diagnosis  Trapezius muscle spasm    Referring Provider (PT)  Karlton Lemon, MD    Onset Date/Surgical Date  06/23/19    Hand Dominance  Right    Prior Therapy  none for current condition/episode      Precautions   Precautions  None  Restrictions   Weight Bearing Restrictions  No      Balance Screen   Has the patient fallen in the past 6 months  No      Prior Function   Level of Independence  Independent with basic ADLs      Cognition   Overall Cognitive Status  Within Functional Limits for tasks assessed      Observation/Other Assessments   Focus on Therapeutic Outcomes (FOTO)   43% limited      Sensation   Light Touch  Appears Intact   C5-T1 dermatomes     Posture/Postural Control   Posture Comments  Mildly rounded shoulders      ROM / Strength   AROM / PROM / Strength  AROM;Strength      AROM   Overall AROM Comments  Bilat. shoulder AROM grossly [facility]    AROM Assessment Site  Cervical    Cervical Flexion  45    Cervical Extension  40    Cervical - Right Side Bend  29    Cervical - Left Side Bend  40    Cervical - Right Rotation  68    Cervical - Left Rotation  80      Strength    Overall Strength Comments  Bilat. UE MMTs grossly 5/5      Palpation   Palpation comment  Tender to palpation with trigger points right upper trapezius, infraspinatus and teres minor, mild rhomboid tension      Special Tests    Special Tests  Cervical    Cervical Tests  Spurling's      Spurling's   Findings  Negative    Side  Right                Objective measurements completed on examination: See above findings.      [facility] Adult PT Treatment/Exercise - 07/14/19 0001      Exercises   Exercises  Neck      Neck Exercises: Stretches   Other Neck Stretches  HEP instruction right upper trapezius and posterior shoulder as well as corner pec stretch       Trigger Point Dry Needling - 07/14/19 0001    Consent Given?  Yes    Education Handout Provided  Yes    Muscles Treated Head and Neck  Upper trapezius    Muscles Treated Upper Quadrant  Infraspinatus;Teres minor    Dry Needling Comments  needled in prone with 30 gauge 40 mm needles left in situ with estim    Electrical Stimulation Performed with Dry Needling  Yes    E-stim with Dry Needling Details  TENS 20 pps x 10 min to right upper trapezius and infraspinatus 1 channel    Upper Trapezius Response  Twitch reponse elicited    Infraspinatus Response  Twitch response elicited           PT Education - 07/14/19 1256    Education Details  exam findings, trigger point etiology, dry needling, POC, HEP    Person(s) Educated  Patient    Methods  Explanation;Demonstration;Verbal cues;Handout    Comprehension  Returned demonstration;Verbalized understanding          PT Long Term Goals - 07/14/19 1307      PT LONG TERM GOAL #1   Title  Independent with HEP    Baseline  needs HEP    Time  6    Period  Weeks    Status  New    Target  Date  08/25/19      PT LONG TERM GOAL #2   Title  Improve FOTO oucome measure score to 30% or less impairment    Baseline  43% limited    Time  6    Period  Weeks    Status   New    Target Date  08/25/19      PT LONG TERM GOAL #3   Title  Increase right cervical rotation AROM at least 10 deg to improve ability to turn head while driving    Baseline  68 deg    Time  6    Period  Weeks    Status  New    Target Date  08/25/19      PT LONG TERM GOAL #4   Title  Tolerate sitting for computer work at least 40 min or greater with pain 2/10 or less    Baseline  4/10    Time  6    Period  Weeks    Status  New    Target Date  08/25/19             Plan - 07/14/19 1258    Clinical Impression Statement  Pt. presents with right upper trapezius and posterior scapular region pain with exam findings and clinical presentation consistent with myofascial etiology with trigger points. Proximal arm symptoms reproduced with infraspinatus and teres trigger point palpation so given this and (-) Spurling's, lack of other radicular symptoms suspect arm pain is referred myofascial pain rather than radicular in etiology. Pt. would benefit from PT to help relieve pain and address current associated functional limitations.    Personal Factors and Comorbidities  --   history chronic cervical tension   Examination-Activity Limitations  Lift;Carry;Sit    Examination-Participation Restrictions  Laundry;Cleaning   sitting for computer work   Stability/Clinical Decision Making  Stable/Uncomplicated    Clinical Decision Making  Low    Rehab Potential  Good    PT Frequency  --   1-2x/week   PT Duration  6 weeks    PT Treatment/Interventions  Spinal Manipulations;Taping;ADLs/Self Care Home Management;Electrical Stimulation;Moist Heat;Traction;Ultrasound;Cryotherapy;Neuromuscular re-education;Therapeutic activities;Therapeutic exercise;Patient/family education;Manual techniques;Dry needling    PT Next Visit Plan  check response dry needling and continue as found beneficial, manual work to right upper trapezius, cervical spine and posterior scapular region, stretches for upper trap,  levator, pecs, posterior shoulder, add postural stabilization as tolerated, modalties prn    PT Home Exercise Plan  upper trapezius and posterior shoulder stretches, corner pec stretch    Consulted and Agree with Plan of Care  Patient       Patient will benefit from skilled therapeutic intervention in order to improve the following deficits and impairments:  Increased muscle spasms, Pain, Postural dysfunction, Increased fascial restricitons  Visit Diagnosis: Cervicalgia  Other muscle spasm     Problem List Patient Active Problem List   Diagnosis Date Noted  . Acute neck pain 07/02/2019  . Right ankle injury, subsequent encounter 01/11/2018  . Plantar wart 04/02/2014  . Peroneal tendinitis of right lower extremity 03/12/2014  . Left wrist pain 07/30/2013  . Left hip pain 07/30/2013  . Atypical chest pain 03/05/2013  . Diverticulosis 07/27/2012  . Family history of osteoporosis 12/28/2011  . Right foot pain 08/09/2011  . Sinusitis, chronic   . Colon polyp   . Menopause   . Migraine   . TRANSAMINASES, SERUM, ELEVATED 12/07/2009  . LUNG NODULE 11/19/2009  . Nonspecific (abnormal) findings on radiological  and other examination of body structure 11/19/2009  . [place] LUNG FIELD 11/19/2009  . RHINITIS 06/21/2009  . Acute sinusitis, unspecified 05/20/2009  . HIP PAIN, RIGHT 04/12/2009  . NEUTROPENIA UNSPECIFIED 10/07/2008  . HYPERLIPIDEMIA 08/25/2008  . DEPRESSION 08/25/2008    Beaulah Dinning, PT, DPT 07/14/19 1:13 PM  [facility] [address] [place], [place], [address] Phone: [phone]   Fax:  [phone]  Name: Kara Warren MRN: [id number] Date of Birth: 01-25-1956

## 2019-07-16 DIAGNOSIS — M542 Cervicalgia: Secondary | ICD-10-CM | POA: Diagnosis not present

## 2019-07-16 DIAGNOSIS — R002 Palpitations: Secondary | ICD-10-CM | POA: Diagnosis not present

## 2019-07-16 DIAGNOSIS — M549 Dorsalgia, unspecified: Secondary | ICD-10-CM | POA: Diagnosis not present

## 2019-07-16 MED FILL — CYCLOBENZAPRINE 5 MG TABLET: 5 | 5 days supply | Qty: 30 | Fill #0

## 2019-07-17 MED FILL — CITALOPRAM HBR 20 MG TABLET: 20 | 90 days supply | Qty: 90 | Fill #1

## 2019-07-24 ENCOUNTER — Ambulatory Visit: Payer: 59 | Admitting: Physical Therapy

## 2019-07-24 ENCOUNTER — Encounter: Payer: Self-pay | Admitting: Physical Therapy

## 2019-07-24 ENCOUNTER — Other Ambulatory Visit: Payer: Self-pay

## 2019-07-24 DIAGNOSIS — M62838 Other muscle spasm: Secondary | ICD-10-CM

## 2019-07-24 DIAGNOSIS — M542 Cervicalgia: Secondary | ICD-10-CM | POA: Diagnosis not present

## 2019-07-24 DIAGNOSIS — M545 Low back pain: Secondary | ICD-10-CM | POA: Diagnosis not present

## 2019-07-24 NOTE — Therapy (Signed)
[place], [place], [address] Phone: [phone]   Fax:  [phone]  Physical Therapy Treatment  Patient Details  Name: Kara Warren MRN: [id number] Date of Birth: 25-Nov-1955 Referring Provider (PT): Karlton Lemon, MD   Encounter Date: 07/24/2019  PT End of Session - 07/24/19 1522    Visit Number  2    Number of Visits  10    Date for PT Re-Evaluation  08/25/19    Authorization Type  [facility]    PT Start Time  [id number]    PT Stop Time  1503    PT Time Calculation (min)  47 min    Activity Tolerance  Patient tolerated treatment well    Behavior During Therapy  [facility] for tasks assessed/performed       Past Medical History:  Diagnosis Date  . Colon polyp   . Endometrial polyp 8/08   benign  . History of hiatal hernia   . IBS (irritable bowel syndrome)   . Menopause   . Migraine   . Pneumonia   . Sinusitis, chronic   . 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 COLONOSCOPY    . DILATION AND CURETTAGE OF UTERUS  8/08  . GYNECOLOGIC CRYOSURGERY  1980  . HYSTEROSCOPY W/D&C N/A 03/30/2017   Procedure: DILATATION & CURETTAGE/HYSTEROSCOPY WITH ULTRASOUND GUIDANCE;  Surgeon: Megan Salon, MD;  Location: [facility];  Service: Gynecology;  Laterality: N/A;  . UPPER GI ENDOSCOPY    . uterine ablation  2007    There were no vitals filed for this visit.  Subjective Assessment - 07/24/19 1516    Subjective  Pt. returns reporting some initial soreness after dry needling but then states that it helped with pain. She also received a massage earlier this week which helped as well. No pain pre-tx. but had some soreness in right upper trapezius region yesterday.    Currently in Pain?  No/denies                       [facility] Adult PT Treatment/Exercise - 07/24/19 0001      Neck Exercises: Theraband   Other Theraband Exercises  HEP  instruction with handout review Theraband row, extension, supine horizontal abduction (issued green and red Theraband)      Manual Therapy   Manual Therapy  Soft tissue mobilization;Myofascial release;Manual Traction    Manual therapy comments  skilled palpation and monitoring with dry needling    Soft tissue mobilization  STM in sitting right upper trapezius, levator andrhomboid region, also performed supine manual trigger point release right upper trapezius    Myofascial Release  suboccipital release    Manual Traction  gentle cervical manual traction      Neck Exercises: Stretches   Upper Trapezius Stretch  Right;3 reps;30 seconds   supine manual stretch   Levator Stretch  Right;3 reps;30 seconds   supine manual stretch      Trigger Point Dry Needling - 07/24/19 0001    Consent Given?  Yes    Muscles Treated Head and Neck  Upper trapezius;Levator scapulae   incl. middle trapezius   Muscles Treated Upper Quadrant  Rhomboids    Dry Needling Comments  rhomboid needled in prone hammerlock position toward scapula, other muscles needled in prone with needles left in situ during estim    Electrical Stimulation Performed with Dry Needling  Yes  E-stim with Dry Needling Details  TENS 20 pps x 10 min to right upper/middle trapezius and levator     Upper Trapezius Response  Twitch reponse elicited           PT Education - 07/24/19 1521    Education Details  HEP updates, POC    Person(s) Educated  Patient    Methods  Explanation;Demonstration;Verbal cues;Handout    Comprehension  Verbalized understanding;Returned demonstration          PT Long Term Goals - 07/14/19 1307      PT LONG TERM GOAL #1   Title  Independent with HEP    Baseline  needs HEP    Time  6    Period  Weeks    Status  New    Target Date  08/25/19      PT LONG TERM GOAL #2   Title  Improve FOTO oucome measure score to 30% or less impairment    Baseline  43% limited    Time  6    Period  Weeks     Status  New    Target Date  08/25/19      PT LONG TERM GOAL #3   Title  Increase right cervical rotation AROM at least 10 deg to improve ability to turn head while driving    Baseline  68 deg    Time  6    Period  Weeks    Status  New    Target Date  08/25/19      PT LONG TERM GOAL #4   Title  Tolerate sitting for computer work at least 40 min or greater with pain 2/10 or less    Baseline  4/10    Time  6    Period  Weeks    Status  New    Target Date  08/25/19            Plan - 07/24/19 1522    Clinical Impression Statement  Pt. returns for 2nd therapy visit so far responding well to treatment with decreased right upper trapezius and periscapular region myofascial pain and subsequent functional gains for positional tolerance for seated computer work. Still with palpable trigger points in right upper and middle trapezius, levator and rhomboid region addressed today with manual treatment as well as continued dry needling-still some apprehension with dry needling but tolerated gentle technique as previously with estim use.    Examination-Activity Limitations  Lift;Carry;Sit    Examination-Participation Restrictions  Laundry;Cleaning    Stability/Clinical Decision Making  Stable/Uncomplicated    Clinical Decision Making  Low    Rehab Potential  Good    PT Frequency  --   1-2x/week   PT Duration  6 weeks    PT Treatment/Interventions  Spinal Manipulations;Taping;ADLs/Self Care Home Management;Electrical Stimulation;Moist Heat;Traction;Ultrasound;Cryotherapy;Neuromuscular re-education;Therapeutic activities;Therapeutic exercise;Patient/family education;Manual techniques;Dry needling    PT Next Visit Plan  continue dry needling with estim as found beneficial, postural strengthening and stretches, manual therapy and modalities as needed    PT Home Exercise Plan  upper trapezius and posterior shoulder stretches, corner pec stretch, Theraband rows, extension and supine horizontal  abduction    Consulted and Agree with Plan of Care  Patient       Patient will benefit from skilled therapeutic intervention in order to improve the following deficits and impairments:  Increased muscle spasms, Pain, Postural dysfunction, Increased fascial restricitons  Visit Diagnosis: Cervicalgia  Other muscle spasm     Problem List Patient Active  Problem List   Diagnosis Date Noted  . Acute neck pain 07/02/2019  . Right ankle injury, subsequent encounter 01/11/2018  . Plantar wart 04/02/2014  . Peroneal tendinitis of right lower extremity 03/12/2014  . Left wrist pain 07/30/2013  . Left hip pain 07/30/2013  . Atypical chest pain 03/05/2013  . Diverticulosis 07/27/2012  . Family history of osteoporosis 12/28/2011  . Right foot pain 08/09/2011  . Sinusitis, chronic   . Colon polyp   . Menopause   . Migraine   . TRANSAMINASES, SERUM, ELEVATED 12/07/2009  . LUNG NODULE 11/19/2009  . Nonspecific (abnormal) findings on radiological and other examination of body structure 11/19/2009  . [place] LUNG FIELD 11/19/2009  . RHINITIS 06/21/2009  . Acute sinusitis, unspecified 05/20/2009  . HIP PAIN, RIGHT 04/12/2009  . NEUTROPENIA UNSPECIFIED 10/07/2008  . HYPERLIPIDEMIA 08/25/2008  . DEPRESSION 08/25/2008    Beaulah Dinning, PT, DPT 07/24/19 3:28 PM  [facility] [address] [place], [place], [address] Phone: [phone]   Fax:  [phone]  Name: ZANIB DUANE MRN: [id number] Date of Birth: Jun 12, 1956

## 2019-07-28 ENCOUNTER — Ambulatory Visit: Payer: 59 | Admitting: Family Medicine

## 2019-07-28 ENCOUNTER — Other Ambulatory Visit: Payer: Self-pay

## 2019-07-28 VITALS — BP 112/82 | Ht 65.0 in | Wt 140.0 lb

## 2019-07-28 DIAGNOSIS — M545 Low back pain, unspecified: Secondary | ICD-10-CM

## 2019-07-28 DIAGNOSIS — S39012A Strain of muscle, fascia and tendon of lower back, initial encounter: Secondary | ICD-10-CM | POA: Diagnosis not present

## 2019-07-28 HISTORY — DX: Strain of muscle, fascia and tendon of lower back, initial encounter: S39.012A

## 2019-07-28 MED ORDER — MELOXICAM 15 MG PO TABS: ORAL_TABLET | ORAL | 0 refills | Status: DC

## 2019-07-28 MED FILL — MELOXICAM 15 MG TABLET: 15 | 40 days supply | Qty: 40 | Fill #0

## 2019-07-28 NOTE — Progress Notes (Signed)
Subjective:  HPI: Kara Warren is a 63 y.o. presenting to clinic today to discuss the following:  Left Low Back Pain Patient is a 63 y/o female with PMH of sinusitis, diverticulosis, and neck pain who presents today for left sided low back pain that radiates to her left lateral hip and down the back of her leg. It began on Friday, rated at a 10/10 and described as achy and sore. She has had an episode of this in the past but not this painful. She has been using ice, flexeril, lidocaine patch, and Alleve twice a day and it is helpful but the pain is returning. It improves with laying down and standing, worse with sitting.  No skin changes, numbness.  ROS noted in HPI.   Social History   Tobacco Use  Smoking Status Former Smoker  . Packs/day: 0.10  . Years: 3.00  . Pack years: 0.30  . Types: Cigarettes  . Quit date: 10/02/1978  . Years since quitting: 40.8  Smokeless Tobacco Never Used    Objective: BP 112/82   Ht 5\' 5"  (1.651 m)   Wt 140 lb (63.5 kg)   LMP 10/02/2006 (Approximate)   BMI 23.30 kg/m  Vitals and nursing notes reviewed  Physical Exam Gen: Alert and Oriented x 3, NAD MSK:   Back Exam:  Inspection: Unremarkable.  No instability. Palpable tenderness: Lower left lumbar musculature.  ROM: Pain on active back extension and unable to perform the rest of low back ROM. Strength 5/5 lower extremities. NVI distally. Negative SLRs.  Hip, Left: TTP noted at left sided low back and in the gluteal muscle. No obvious rash, erythema, ecchymosis, or edema. ROM full in all directions (IR: 80/ ER: 80/Flex: 120/Ext: 100/Abd: 45/Add: 45); Strength 5/5 in IR/ER/Flex/Ext/Abd/Add. Pelvic alignment unremarkable to inspection and palpation. Greater trochanter without tenderness to palpation. Tenderness on the lower left lumbar musculature. No SI joint tenderness and normal minimal SI movement. Negative FABIR and FADER. Ext: no clubbing, cyanosis, or edema Skin: warm,  dry, intact, no rashes  Hip, Right: . No obvious rash, erythema, ecchymosis, or edema. ROM full in all directions (IR: 80/ ER: 80/Flex: 120/Ext: 100/Abd: 45/Add: 45); Strength 5/5 in IR/ER/Flex/Ext/Abd/Add. Pelvic alignment unremarkable to inspection and palpation. Greater trochanter without tenderness to palpation. No tenderness over piriformis. No SI joint tenderness and normal minimal SI movement.  Assessment/Plan:  Lumbar strain, initial encounter Symptoms consistent with lumbar strain.  - Cont conservative therapy with heat, flexeril, lidocaine patch, and activity as tolerated - Meloxicam once daily for two weeks - Formal PT - If no improvement patient directed to call back and we can send in tramadol for 3-5 days  PATIENT EDUCATION PROVIDED: See AVS    Diagnosis and plan along with any newly prescribed medication(s) were discussed in detail with this patient today. The patient verbalized understanding and agreed with the plan. Patient advised if symptoms worsen return to clinic or ER.   Orders Placed This Encounter  Procedures  . Ambulatory referral to Physical Therapy    Referral Priority:   Routine    Referral Type:   Physical Medicine    Referral Reason:   Specialty Services Required    Requested Specialty:   Physical Therapy    Number of Visits Requested:   1    Meds ordered this encounter  Medications  . meloxicam (MOBIC) 15 MG tablet    Sig: Take 1 tablet daily with food for 7-14 days. Then take as needed.  Dispense:  40 tablet    Refill:  0     Harolyn Rutherford, DO 07/28/2019, 9:01 AM PGY-3 [place]

## 2019-07-28 NOTE — Patient Instructions (Signed)
Lumbar Strain A lumbar strain, which is sometimes called a low-back strain, is a stretch or tear in a muscle or the strong cords of tissue that attach muscle to bone (tendons) in the lower back (lumbar spine). This type of injury occurs when muscles or tendons are torn or are stretched beyond their limits. Lumbar strains can range from mild to severe. Mild strains may involve stretching a muscle or tendon without tearing it. These may heal in 1-2 weeks. More severe strains involve tearing of muscle fibers or tendons. These will cause more pain and may take 6-8 weeks to heal. What are the causes? This condition may be caused by:  Trauma, such as a fall or a hit to the body.  Twisting or overstretching the back. This may result from doing activities that need a lot of energy, such as lifting heavy objects. What increases the risk? This injury is more common in:  Athletes.  People with obesity.  People who do repeated lifting, bending, or other movements that involve their back. What are the signs or symptoms? Symptoms of this condition may include:  Sharp or dull pain in the lower back that does not go away. The pain may extend to the buttocks.  Stiffness or limited range of motion.  Sudden muscle tightening (spasms). How is this diagnosed? This condition may be diagnosed based on:  Your symptoms.  Your medical history.  A physical exam.  Imaging tests, such as: ? X-rays. ? MRI. How is this treated? Treatment for this condition may include:  Rest.  Applying heat and cold to the affected area.  Over-the-counter medicines to help relieve pain and inflammation, such as NSAIDs.  Prescription pain medicine and muscle relaxants may be needed for a short time.  Physical therapy. Follow these instructions at home: Managing pain, stiffness, and swelling      If directed, put ice on the injured area during the first 24 hours after your injury. ? Put ice in a plastic bag.  ? Place a towel between your skin and the bag. ? Leave the ice on for 20 minutes, 2-3 times a day.  If directed, apply heat to the affected area as often as told by your health care provider. Use the heat source that your health care provider recommends, such as a moist heat pack or a heating pad. ? Place a towel between your skin and the heat source. ? Leave the heat on for 20-30 minutes. ? Remove the heat if your skin turns bright red. This is especially important if you are unable to feel pain, heat, or cold. You may have a greater risk of getting burned. Activity  Rest and return to your normal activities as told by your health care provider. Ask your health care provider what activities are safe for you.  Do exercises as told by your health care provider. Medicines  Take over-the-counter and prescription medicines only as told by your health care provider.  Ask your health care provider if the medicine prescribed to you: ? Requires you to avoid driving or using heavy machinery. ? Can cause constipation. You may need to take these actions to prevent or treat constipation:  Drink enough fluid to keep your urine pale yellow.  Take over-the-counter or prescription medicines.  Eat foods that are high in fiber, such as beans, whole grains, and fresh fruits and vegetables.  Limit foods that are high in fat and processed sugars, such as fried or sweet foods. Injury prevention To prevent   a future low-back injury:  Always warm up properly before physical activity or sports.  Cool down and stretch after being active.  Use correct form when playing sports and lifting heavy objects. Bend your knees before you lift heavy objects.  Use good posture when sitting and standing.  Stay physically fit and keep a healthy weight. ? Do at least 150 minutes of moderate-intensity exercise each week, such as brisk walking or water aerobics. ? Do strength exercises at least 2 times each week.   General instructions  Do not use any products that contain nicotine or tobacco, such as cigarettes, e-cigarettes, and chewing tobacco. If you need help quitting, ask your health care provider.  Keep all follow-up visits as told by your health care provider. This is important. Contact a health care provider if:  Your back pain does not improve after 6 weeks of treatment.  Your symptoms get worse. Get help right away if:  Your back pain is severe.  You are unable to stand or walk.  You develop pain in your legs.  You develop weakness in your buttocks or legs.  You have difficulty controlling when you urinate or when you have a bowel movement. ? You have frequent, painful, or bloody urination. ? You have a temperature over 101.0F (38.3C) Summary  A lumbar strain, which is sometimes called a low-back strain, is a stretch or tear in a muscle or the strong cords of tissue that attach muscle to bone (tendons) in the lower back (lumbar spine).  This type of injury occurs when muscles or tendons are torn or are stretched beyond their limits.  Rest and return to your normal activities as told by your health care provider. If directed, apply heat and ice to the affected area as often as told by your health care provider.  Take over-the-counter and prescription medicines only as told by your health care provider.  Contact a health care provider if you have new or worsening symptoms. This information is not intended to replace advice given to you by your health care provider. Make sure you discuss any questions you have with your health care provider. Document Released: 09/18/2005 Document Revised: 07/18/2018 Document Reviewed: 07/18/2018 Elsevier Patient Education  2020 [employer]  

## 2019-07-28 NOTE — Assessment & Plan Note (Signed)
Symptoms consistent with lumbar strain.  - Cont conservative therapy with heat, flexeril, lidocaine patch, and activity as tolerated - Meloxicam once daily for two weeks - Formal PT - If no improvement patient directed to call back and we can send in tramadol for 3-5 days

## 2019-07-29 ENCOUNTER — Encounter: Payer: Self-pay | Admitting: Family Medicine

## 2019-07-30 ENCOUNTER — Encounter: Payer: Self-pay | Admitting: Physical Therapy

## 2019-07-30 ENCOUNTER — Other Ambulatory Visit: Payer: Self-pay

## 2019-07-30 ENCOUNTER — Ambulatory Visit: Payer: 59 | Admitting: Physical Therapy

## 2019-07-30 ENCOUNTER — Ambulatory Visit: Payer: 59 | Admitting: Family Medicine

## 2019-07-30 DIAGNOSIS — M545 Low back pain, unspecified: Secondary | ICD-10-CM

## 2019-07-30 DIAGNOSIS — M542 Cervicalgia: Secondary | ICD-10-CM

## 2019-07-30 DIAGNOSIS — M62838 Other muscle spasm: Secondary | ICD-10-CM | POA: Diagnosis not present

## 2019-07-30 NOTE — Therapy (Signed)
[place], [place], [address] Phone: [phone]   Fax:  [phone]  Physical Therapy Treatment / Re-evaluation  Patient Details  Name: Kara Warren MRN: [id number] Date of Birth: 1955-12-18 Referring Provider (PT): Karlton Lemon, MD   Encounter Date: 07/30/2019  PT End of Session - 07/30/19 1104    Visit Number  3    Number of Visits  10    Date for PT Re-Evaluation  08/25/19    Authorization Type  [facility]    PT Start Time  1016    PT Stop Time  1101    PT Time Calculation (min)  45 min    Activity Tolerance  Patient tolerated treatment well    Behavior During Therapy  [facility] for tasks assessed/performed       Past Medical History:  Diagnosis Date  . Colon polyp   . Endometrial polyp 8/08   benign  . History of hiatal hernia   . IBS (irritable bowel syndrome)   . Menopause   . Migraine   . Pneumonia   . Sinusitis, chronic   . 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 COLONOSCOPY    . DILATION AND CURETTAGE OF UTERUS  8/08  . GYNECOLOGIC CRYOSURGERY  1980  . HYSTEROSCOPY W/D&C N/A 03/30/2017   Procedure: DILATATION & CURETTAGE/HYSTEROSCOPY WITH ULTRASOUND GUIDANCE;  Surgeon: Megan Salon, MD;  Location: [facility];  Service: Gynecology;  Laterality: N/A;  . UPPER GI ENDOSCOPY    . uterine ablation  2007    There were no vitals filed for this visit.  Subjective Assessment - 07/30/19 1022    Subjective  I have been doing the exercises every day and have been working on the pressure relief. pt presents with new referral for low back pain that has been going on for 1-2 weeks ago with no specific MOI.  soreness noted primarly on the L side and down the leg with had progressivley worsened since it started down the LLE into the hamstring.    Limitations  Sitting;Lifting;House hold activities    Diagnostic tests  no current imaging    Patient Stated Goals  improve pain    Currently in Pain?  Yes    Pain Score  3     Pain Location  Neck    Pain Orientation  Right    Pain Descriptors / Indicators  Aching;Sore    Pain Type  Chronic pain    Pain Onset  More than a month ago    Pain Frequency  Intermittent    Aggravating Factors   prolonged positioning sitting    Pain Relieving Factors  medication    Multiple Pain Sites  Yes    Pain Score  4   at worst 10/10   Pain Location  Back    Pain Orientation  Left    Pain Descriptors / Indicators  Aching;Sore;Sharp    Pain Type  Acute pain    Pain Radiating Towards  down to the back of the L hamstrings    Pain Onset  More than a month ago    Pain Frequency  Intermittent    Aggravating Factors   prolonged sitting, standing    Pain Relieving Factors  changing position, heat         [facility] PT Assessment - 07/30/19 0001      Assessment   Medical Diagnosis  Trapezius muscle spasm, L low back pain    Referring Provider (PT)  Karlton Lemon, MD    Onset Date/Surgical Date  06/23/19    Prior Therapy  currently being seen for the neck      Precautions   Precautions  None      Restrictions   Weight Bearing Restrictions  No      AROM   AROM Assessment Site  Lumbar    Lumbar Flexion  120    Lumbar Extension  20    Lumbar - Right Side Bend  20    Lumbar - Left Side Bend  15      Special Tests    Special Tests  Sacrolliac Tests;Lumbar    Sacroiliac Tests   Gaenslen's Test                   [facility] Adult PT Treatment/Exercise - 07/30/19 0001      Manual Therapy   Manual Therapy  Muscle Energy Technique;Joint mobilization    Manual therapy comments  MTPR along the R upper trap   combined with education on length of treatment   Joint Mobilization  LAD grade IV on LLE    Muscle Energy Technique  L hip flexor 5 x 10 sec hold       Neck Exercises: Stretches   Upper Trapezius Stretch  2 reps;30 seconds             PT Education - 07/30/19 1103     Education Details  re-evaluation assessment, updated POC/ goals, updated HEP to include back / SIJ,    Person(s) Educated  Patient    Methods  Explanation;Verbal cues;Handout    Comprehension  Verbalized understanding;Verbal cues required          PT Long Term Goals - 07/30/19 1243      PT LONG TERM GOAL #1   Title  Independent with HEP    Time  6    Period  Weeks    Status  On-going      PT LONG TERM GOAL #2   Title  Improve FOTO oucome measure score to 30% or less impairment    Period  Weeks    Status  On-going      PT LONG TERM GOAL #3   Title  Increase right cervical rotation AROM at least 10 deg to improve ability to turn head while driving    Time  6    Period  Weeks    Status  On-going      PT LONG TERM GOAL #4   Title  Tolerate sitting for computer work at least 40 min or greater with pain 2/10 or less    Time  6    Period  Weeks    Status  On-going      PT LONG TERM GOAL #5   Title  maintain current trunk ROM with no report of pain or limitations for QOL    Time  6    Period  Weeks    Status  On-going    Target Date  08/25/19            Plan - 07/30/19 1104    Clinical Impression Statement  pt presents to OPPT with new referral to add low back pain to POC. She has functional trunk mobility with some soreness noted at end range extension and L sidebending. special testing cluster is positive for a high likelihood of L SIJ involvement. She responded  well to hamstring stretching and hip flexor METS. reviewed MTPR release techniques for home treatment. she would benefit PT to work on low back pain, hip flexor/ stregthening and posture / lifting mechaincs to maximize function by addressing the deficits listed.    Rehab Potential  Good    PT Frequency  1x / week    PT Duration  6 weeks    PT Treatment/Interventions  Spinal Manipulations;Taping;ADLs/Self Care Home Management;Electrical Stimulation;Moist Heat;Traction;Ultrasound;Cryotherapy;Neuromuscular  re-education;Therapeutic activities;Therapeutic exercise;Patient/family education;Manual techniques;Dry needling    PT Next Visit Plan  continue dry needling with estim as found beneficial, postural strengthening and stretches, manual therapy and modalities as needed, potential L SIJ: hamstring stretching, hip flexor activation,  posture/ lifting mechanics    PT Home Exercise Plan  upper trapezius and posterior shoulder stretches, corner pec stretch, Theraband rows, extension and supine horizontal abduction, SLR, hip flexor MET, hamstring stretching seated and supine.    Consulted and Agree with Plan of Care  Patient       Patient will benefit from skilled therapeutic intervention in order to improve the following deficits and impairments:  Increased muscle spasms, Pain, Postural dysfunction, Increased fascial restricitons  Visit Diagnosis: Cervicalgia  Other muscle spasm  Acute right-sided low back pain, unspecified whether sciatica present  Acute left-sided low back pain, unspecified whether sciatica present     Problem List Patient Active Problem List   Diagnosis Date Noted  . Lumbar strain, initial encounter 07/28/2019  . Acute neck pain 07/02/2019  . Right ankle injury, subsequent encounter 01/11/2018  . Plantar wart 04/02/2014  . Peroneal tendinitis of right lower extremity 03/12/2014  . Left wrist pain 07/30/2013  . Left hip pain 07/30/2013  . Atypical chest pain 03/05/2013  . Diverticulosis 07/27/2012  . Family history of osteoporosis 12/28/2011  . Right foot pain 08/09/2011  . Sinusitis, chronic   . Colon polyp   . Menopause   . Migraine   . TRANSAMINASES, SERUM, ELEVATED 12/07/2009  . LUNG NODULE 11/19/2009  . Nonspecific (abnormal) findings on radiological and other examination of body structure 11/19/2009  . [place] LUNG FIELD 11/19/2009  . RHINITIS 06/21/2009  . Acute sinusitis, unspecified 05/20/2009  . HIP PAIN, RIGHT 04/12/2009   . NEUTROPENIA UNSPECIFIED 10/07/2008  . HYPERLIPIDEMIA 08/25/2008  . DEPRESSION 08/25/2008   Starr Lake PT, DPT, LAT, ATC  07/30/19  12:46 PM      [facility] [address] [place], [place], [address] Phone: [phone]   Fax:  [phone]  Name: Kara Warren MRN: [id number] Date of Birth: [birth year]

## 2019-08-04 ENCOUNTER — Other Ambulatory Visit: Payer: Self-pay

## 2019-08-04 ENCOUNTER — Ambulatory Visit: Payer: 59 | Admitting: Family Medicine

## 2019-08-04 ENCOUNTER — Ambulatory Visit: Payer: 59 | Admitting: Physical Therapy

## 2019-08-04 DIAGNOSIS — J329 Chronic sinusitis, unspecified: Secondary | ICD-10-CM | POA: Diagnosis not present

## 2019-08-04 DIAGNOSIS — J309 Allergic rhinitis, unspecified: Secondary | ICD-10-CM | POA: Diagnosis not present

## 2019-08-04 DIAGNOSIS — R070 Pain in throat: Secondary | ICD-10-CM | POA: Diagnosis not present

## 2019-08-04 DIAGNOSIS — Z20822 Contact with and (suspected) exposure to covid-19: Secondary | ICD-10-CM

## 2019-08-05 LAB — NOVEL CORONAVIRUS, NAA: SARS-CoV-2, NAA: NOT DETECTED

## 2019-08-06 MED FILL — AZELASTINE HCL 137 MCG SPRY: 0.1 | 30 days supply | Qty: 30 | Fill #0

## 2019-08-11 ENCOUNTER — Encounter: Payer: Self-pay | Admitting: Physical Therapy

## 2019-08-11 ENCOUNTER — Ambulatory Visit: Payer: 59 | Admitting: Family Medicine

## 2019-08-11 ENCOUNTER — Encounter: Payer: Self-pay | Admitting: Family Medicine

## 2019-08-11 ENCOUNTER — Other Ambulatory Visit: Payer: Self-pay

## 2019-08-11 ENCOUNTER — Ambulatory Visit: Payer: 59 | Attending: Family Medicine | Admitting: Physical Therapy

## 2019-08-11 ENCOUNTER — Other Ambulatory Visit: Payer: Self-pay | Admitting: Family Medicine

## 2019-08-11 DIAGNOSIS — S39012A Strain of muscle, fascia and tendon of lower back, initial encounter: Secondary | ICD-10-CM

## 2019-08-11 DIAGNOSIS — M62838 Other muscle spasm: Secondary | ICD-10-CM | POA: Insufficient documentation

## 2019-08-11 DIAGNOSIS — M542 Cervicalgia: Secondary | ICD-10-CM | POA: Diagnosis not present

## 2019-08-11 DIAGNOSIS — M545 Low back pain, unspecified: Secondary | ICD-10-CM

## 2019-08-11 MED FILL — MONTELUKAST SOD 10 MG TAB: 10 | 90 days supply | Qty: 90 | Fill #1

## 2019-08-11 NOTE — Therapy (Signed)
[place], [place], [address] Phone: [phone]   Fax:  [phone]  Physical Therapy Treatment  Patient Details  Name: Kara Warren MRN: [id number] Date of Birth: 01-04-1956 Referring Provider (PT): Karlton Lemon, MD   Encounter Date: 08/11/2019  PT End of Session - 08/11/19 1252    Visit Number  4    Number of Visits  10    Date for PT Re-Evaluation  08/25/19    Authorization Type  [facility]    PT Start Time  1146    PT Stop Time  1230    PT Time Calculation (min)  44 min    Activity Tolerance  Patient tolerated treatment well    Behavior During Therapy  [facility] for tasks assessed/performed       Past Medical History:  Diagnosis Date  . Colon polyp   . Endometrial polyp 8/08   benign  . History of hiatal hernia   . IBS (irritable bowel syndrome)   . Menopause   . Migraine   . Pneumonia   . Sinusitis, chronic   . 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 COLONOSCOPY    . DILATION AND CURETTAGE OF UTERUS  8/08  . GYNECOLOGIC CRYOSURGERY  1980  . HYSTEROSCOPY W/D&C N/A 03/30/2017   Procedure: DILATATION & CURETTAGE/HYSTEROSCOPY WITH ULTRASOUND GUIDANCE;  Surgeon: Megan Salon, MD;  Location: [facility];  Service: Gynecology;  Laterality: N/A;  . UPPER GI ENDOSCOPY    . uterine ablation  2007    There were no vitals filed for this visit.  Subjective Assessment - 08/11/19 1147    Subjective  No pain in right upper trap/periscapular region today but still noting some tightness, overall doing a lot better (rates 80-85% improved) with this area though still taking Meloxicam. Also noting improvement with LBP/left "sciatica" symptoms after last session as well.    Currently in Pain?  No/denies         [facility] PT Assessment - 08/11/19 0001      Palpation   SI assessment   no innominate rotation noted today                    [facility] Adult PT Treatment/Exercise - 08/11/19 0001      Exercises   Exercises  Lumbar;Knee/Hip      Lumbar Exercises: Stretches   Passive Hamstring Stretch  Right;Left;2 reps;30 seconds    Piriformis Stretch  Left;3 reps;30 seconds    Figure 4 Stretch  3 reps;30 seconds      Lumbar Exercises: Supine   Clam  20 reps    Clam Limitations  green band    Bridge  15 reps    Other Supine Lumbar Exercises  hooklying hip adduction isometric with ball 15 x 5 sec      Manual Therapy   Joint Mobilization  LAD grade IV on LLE   focus left but today included bilat. LE   Soft tissue mobilization  STM left posterolateral hip in right sidelying, right upper trapezius and rhomboid region in sitting    Muscle Energy Technique  "shotgun" 5 x 5 sec ea.             PT Education - 08/11/19 1246    Education Details  SI anatomy, HEP, POC    Person(s) Educated  Patient    Methods  Explanation;Demonstration;Verbal cues  Comprehension  Verbalized understanding;Returned demonstration          PT Long Term Goals - 07/30/19 1243      PT LONG TERM GOAL #1   Title  Independent with HEP    Time  6    Period  Weeks    Status  On-going      PT LONG TERM GOAL #2   Title  Improve FOTO oucome measure score to 30% or less impairment    Period  Weeks    Status  On-going      PT LONG TERM GOAL #3   Title  Increase right cervical rotation AROM at least 10 deg to improve ability to turn head while driving    Time  6    Period  Weeks    Status  On-going      PT LONG TERM GOAL #4   Title  Tolerate sitting for computer work at least 40 min or greater with pain 2/10 or less    Time  6    Period  Weeks    Status  On-going      PT LONG TERM GOAL #5   Title  maintain current trunk ROM with no report of pain or limitations for QOL    Time  6    Period  Weeks    Status  On-going    Target Date  08/25/19            Plan - 08/11/19 1252    Clinical Impression  Statement  Good response to therapy visits for both neck/right upper trapezius region pain as well as lumbar/SI joint pain as noted in subjective. No innominate rotation noted so held directionally specific METs but focused otherwise stretches, manual work and light stabilization exercises to address. Tentatively plan continue POC per re-eval performed last week but discussed with pt. when noting consistent enough symptom relief and feeling comfortable enough with HEP would plan d/c.    Examination-Activity Limitations  Lift;Carry;Sit    Examination-Participation Restrictions  Laundry;Cleaning    Stability/Clinical Decision Making  Stable/Uncomplicated    Clinical Decision Making  Low    Rehab Potential  Good    PT Frequency  1x / week    PT Duration  6 weeks    PT Treatment/Interventions  Spinal Manipulations;Taping;ADLs/Self Care Home Management;Electrical Stimulation;Moist Heat;Traction;Ultrasound;Cryotherapy;Neuromuscular re-education;Therapeutic activities;Therapeutic exercise;Patient/family education;Manual techniques;Dry needling    PT Next Visit Plan  check for innominate rotation, METs prn, lumbopelvic stabilization, hamstring stretches, left hip flexor activation, posture/lifting mechanics, further tx. to right upper trapezius region including manual, dry needling and stretches prn    PT Home Exercise Plan  upper trapezius and posterior shoulder stretches, corner pec stretch, Theraband rows, extension and supine horizontal abduction, SLR, hip flexor MET, hamstring stretching seated and supine.    Consulted and Agree with Plan of Care  Patient       Patient will benefit from skilled therapeutic intervention in order to improve the following deficits and impairments:  Increased muscle spasms, Pain, Postural dysfunction, Increased fascial restricitons  Visit Diagnosis: Cervicalgia  Other muscle spasm  Acute right-sided low back pain, unspecified whether sciatica present  Acute  left-sided low back pain, unspecified whether sciatica present     Problem List Patient Active Problem List   Diagnosis Date Noted  . Lumbar strain, initial encounter 07/28/2019  . Acute neck pain 07/02/2019  . Right ankle injury, subsequent encounter 01/11/2018  . Plantar wart 04/02/2014  . Peroneal tendinitis of right lower  extremity 03/12/2014  . Left wrist pain 07/30/2013  . Left hip pain 07/30/2013  . Atypical chest pain 03/05/2013  . Diverticulosis 07/27/2012  . Family history of osteoporosis 12/28/2011  . Right foot pain 08/09/2011  . Sinusitis, chronic   . Colon polyp   . Menopause   . Migraine   . TRANSAMINASES, SERUM, ELEVATED 12/07/2009  . LUNG NODULE 11/19/2009  . Nonspecific (abnormal) findings on radiological and other examination of body structure 11/19/2009  . [place] LUNG FIELD 11/19/2009  . RHINITIS 06/21/2009  . Acute sinusitis, unspecified 05/20/2009  . HIP PAIN, RIGHT 04/12/2009  . NEUTROPENIA UNSPECIFIED 10/07/2008  . HYPERLIPIDEMIA 08/25/2008  . DEPRESSION 08/25/2008    Beaulah Dinning, PT, DPT 08/11/19 12:58 PM  [facility] [name] [place], [place], [address] Phone: [phone]   Fax:  [phone]  Name: Kara Warren MRN: [id number] Date of Birth: [birth year]

## 2019-08-11 NOTE — Assessment & Plan Note (Signed)
Patient following up for lower back pain.  It is much improved.  Patient to continue physical therapy for her next 2 sessions and continue with home exercises few times a week.  Patient may take meloxicam as needed.  Patient to follow-up as needed if she returns.

## 2019-08-11 NOTE — Progress Notes (Signed)
    Subjective:  Kara Warren is a 63 y.o. female who presents to the [facility] today with a chief complaint of lower back pain.   HPI:  Patient here for follow-up on lower back pain. She has been going to physical therapy and taking meloxicam regularly.  She is also doing her home exercises.  She still has occasional pain but it is significantly improved.  Is slight on the left, and does not radiate any longer.  Patient reports that she would like to cut back to meloxicam as needed instead of daily.  She is not taking Flexeril.  No skin changes.  ROS: Per HPI  Objective:  Physical Exam: BP 114/68   Ht 5\' 5"  (1.651 m)   Wt 140 lb (63.5 kg)   LMP 10/02/2006 (Approximate)   BMI 23.30 kg/m   Gen: NAD, resting comfortably Pulm: NWOB  Lower back:  Inspection: no asymmetry or bruising Palpation: Nontender to palpation ROM: Normal range of motion flexion extension Strength: 5 of 5 strength in lower extremities Stability: Stable hips Special tests: Negative straight leg raise Skin: warm, dry Neuro: grossly normal, moves all extremities Psych: Normal affect and thought content   Assessment/Plan:  Lumbar strain, initial encounter Patient following up for lower back pain.  It is much improved.  Patient to continue physical therapy for her next 2 sessions and continue with home exercises few times a week.  Patient may take meloxicam as needed.  Patient to follow-up as needed if she returns.   Lab Orders  No laboratory test(s) ordered today    No orders of the defined types were placed in this encounter.     Marny Lowenstein, MD, MS FAMILY MEDICINE RESIDENT - PGY2 08/11/2019 2:19 PM

## 2019-08-18 ENCOUNTER — Ambulatory Visit: Payer: 59 | Admitting: Physical Therapy

## 2019-08-19 DIAGNOSIS — R635 Abnormal weight gain: Secondary | ICD-10-CM | POA: Diagnosis not present

## 2019-08-19 DIAGNOSIS — K219 Gastro-esophageal reflux disease without esophagitis: Secondary | ICD-10-CM | POA: Diagnosis not present

## 2019-08-19 DIAGNOSIS — Z1211 Encounter for screening for malignant neoplasm of colon: Secondary | ICD-10-CM | POA: Diagnosis not present

## 2019-08-20 ENCOUNTER — Other Ambulatory Visit: Payer: Self-pay

## 2019-08-20 ENCOUNTER — Ambulatory Visit: Payer: 59 | Admitting: Cardiovascular Disease

## 2019-08-20 VITALS — BP 128/86 | HR 60 | Ht 65.0 in | Wt 153.9 lb

## 2019-08-20 DIAGNOSIS — R002 Palpitations: Secondary | ICD-10-CM

## 2019-08-20 MED FILL — PEG-3350/ELECTROLYTES 236 G: 236 | 1 days supply | Qty: 4000 | Fill #0

## 2019-08-20 NOTE — Progress Notes (Signed)
Chief Complaint  Patient presents with   New Patient (Initial Visit)    Palpitations   History of Present Illness: 63 yo female with history of depression, irritable bowel syndrome, hiatal hernia, GERD, hyperlipidemia and migraine headaches here today as a new patient to re-establish cardiac care. She has no prior cardiac issues. She was seen in the [facility] ED 02/26/13 with c/o constant left sided chest pain. Troponin negative x 2. EKG without ischemic changes. I saw her in the office in 2014 after her ED visit. . Her pain was felt to be non-cardiac. Echo June 2014 with LVEF=55-60%, no valve disease. Exercise stress test 2014 with no ischemic EKG changes.   She tells me today that she has been feeling her heart race once a week for the past few months. This lasts for up to an hour. This feels like her heart is skipping. No chest pain. No dyspnea. No LE edema. No near syncope or syncope.   Primary Care Physician: Fanny Bien, MD   Past Medical History:  Diagnosis Date   Abdominal pain, left upper quadrant    Acute bronchospasm    Acute neck pain 07/02/2019   Acute sinusitis, unspecified 05/20/2009   Centricity Description: SINUSITIS - ACUTE-NOS Qualifier: Diagnosis of  By: Birdie Riddle MD, Belenda Cruise   Centricity Description: ACUTE SINUSITIS, UNSPECIFIED Qualifier: Diagnosis of  By: Alveta Heimlich MD, Suzanne Boron Description: SINUSITIS, ACUTE Qualifier: Diagnosis of  By: Koleen Nimrod MD, Dellis Filbert     Atypical chest pain 03/05/2013   Body mass index (BMI) of 23.0-23.9 in adult    Cervicalgia    Colon polyp    Costochondral junction syndrome    Cough    DEPRESSION 08/25/2008   Qualifier: Diagnosis of  By: Ronnald Ramp CMA, Chemira     Diarrhea    Diverticulosis 07/27/2012   Dorsalgia    Elevated blood pressure reading without diagnosis of hypertension    Endometrial polyp 8/08   benign   Family history of osteoporosis 12/28/2011   Mother pt had normal Dexa 11/2009    Fatigue    GERD  (gastroesophageal reflux disease)    HIP PAIN, RIGHT 04/12/2009   Qualifier: Diagnosis of  By: Oneida Alar MD, KARL     History of hiatal hernia    HYPERLIPIDEMIA 08/25/2008   Qualifier: Diagnosis of  By: Birdie Riddle MD, Belenda Cruise     IBS (irritable bowel syndrome)    Left hip pain 07/30/2013   Left wrist pain 07/30/2013   Lumbar strain, initial encounter 07/28/2019   LUNG NODULE 11/19/2009   Neg CT     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 Please review for accuracy.   Otalgia, unspecified ear    Pain in throat    Palpitations    Peroneal tendinitis of right lower extremity 03/12/2014   Plantar wart 04/02/2014   Pneumonia    RHINITIS 06/21/2009   Qualifier: Diagnosis of  By: Birdie Riddle MD, Katherine     Right ankle injury, subsequent encounter 01/11/2018   Right foot pain 08/09/2011  Sinusitis, chronic    Skene's gland abscess 2009   patient unaware   Sprain of unspecified ligament of right ankle, subsequent encounter    TRANSAMINASES, SERUM, ELEVATED 12/07/2009   Qualifier: Diagnosis of  By: Inda Castle FNP, Melissa S    Unspecified cataract    Urinary tract infection, site not specified    Vitamin D deficiency     Past Surgical History:  Procedure Laterality Date   COLONOSCOPY     DILATION AND CURETTAGE OF UTERUS  8/08   GYNECOLOGIC CRYOSURGERY  1980   HYSTEROSCOPY W/D&C N/A 03/30/2017   Procedure: DILATATION & CURETTAGE/HYSTEROSCOPY WITH ULTRASOUND GUIDANCE;  Surgeon: Megan Salon, MD;  Location: [facility];  Service: Gynecology;   Laterality: N/A;   UPPER GI ENDOSCOPY     uterine ablation  2007    Current Outpatient Medications  Medication Sig Dispense Refill   albuterol (VENTOLIN HFA) 108 (90 Base) MCG/ACT inhaler Inhale 1-2 puffs into the lungs every 6 (six) hours as needed for wheezing or shortness of breath.     azelastine (ASTELIN) 0.1 % nasal spray daily as needed.      benzonatate (TESSALON) 200 MG capsule Take 200 mg by mouth 3 (three) times daily as needed for cough.     Cholecalciferol (VITAMIN D) 50 MCG (2000 UT) tablet Take 2,000 Units by mouth daily.     citalopram (CELEXA) 20 MG tablet TAKE 1 TABLET BY MOUTH ONCE DAILY 90 tablet 3   cyclobenzaprine (FLEXERIL) 5 MG tablet Take 5 mg by mouth 1 day or 1 dose. At bedtime as needed     diclofenac sodium (VOLTAREN) 1 % GEL APPLY 2 TO 4 GRAMS TOPICALLY TO AFFECTED AREA 4 TIMES PER DAY WHEN NEEDED  3   famotidine (PEPCID) 20 MG tablet Take 20 mg by mouth as needed for heartburn or indigestion.     ipratropium (ATROVENT) 0.06 % nasal spray Place 2 sprays into both nostrils 3 (three) times daily.     levocetirizine (XYZAL) 5 MG tablet Take 5 mg by mouth every evening.     meloxicam (MOBIC) 15 MG tablet TAKE 1 TABLET DAILY WITH FOOD FOR 7-14 DAYS. THEN TAKE AS NEEDED. 40 tablet 0   montelukast (SINGULAIR) 10 MG tablet      Multiple Vitamins-Minerals (MULTI FOR HER 50+) TABS Take 1 tablet by mouth daily.       Probiotic Product (PROBIOTIC PO) Take 1 capsule by mouth daily. Called ultra spectrum     vitamin E 400 UNIT capsule Take 400 Units by mouth daily.     No current facility-administered medications for this visit.     No Known Allergies  Social History   Socioeconomic History   Marital status: Single    Spouse name: Not on file   Number of children: 0   Years of education: Not on file   Highest education level: Not on file  Occupational History   Occupation: Programmer, multimedia: [employer]   Financial resource  strain: Not on file   Food insecurity    Worry: Not on file    Inability: Not on file   Transportation needs    Medical: Not on file    Non-medical: Not on file  Tobacco Use   Smoking status: Former Smoker    Packs/day: 0.10    Years: 3.00    Pack years: 0.30    Types: Cigarettes    Quit date: 10/02/1978    Years since quitting: 65.9  Smokeless tobacco: Never Used  Substance and Sexual Activity   Alcohol use: Yes    Alcohol/week: 7.0 standard drinks    Types: 7 Glasses of wine per week    Comment: per week   Drug use: No   Sexual activity: Not Currently    Birth control/protection: Post-menopausal  Lifestyle   Physical activity    Days per week: Not on file    Minutes per session: Not on file   Stress: Not on file  Relationships   Social connections    Talks on phone: Not on file    Gets together: Not on file    Attends religious service: Not on file    Active member of club or organization: Not on file    Attends meetings of clubs or organizations: Not on file    Relationship status: Not on file   Intimate partner violence    Fear of current or ex partner: Not on file    Emotionally abused: Not on file    Physically abused: Not on file    Forced sexual activity: Not on file  Other Topics Concern   Not on file  Social History Narrative   Not on file    Family History  Problem Relation Age of Onset   Colon polyps Father    Ulcerative colitis Father    CAD Father        Stent placement at age 15   Glaucoma Father    Hypertension Father    Colitis Father    Breast cancer Paternal Grandmother    Colon cancer Maternal Grandfather    Colon polyps Mother    Osteoporosis Mother    Thyroid disease Mother        hypo   Glaucoma Mother    Diverticulosis Mother    Sudden death Neg Hx    Hyperlipidemia Neg Hx    Heart attack Neg Hx    Diabetes Neg Hx     Review of Systems:  As stated in the HPI and otherwise negative.   BP  128/86    Pulse 60    Ht 5\' 5"  (1.651 m)    Wt 153 lb 14.2 oz (69.8 kg)    LMP 10/02/2006 (Approximate)    SpO2 96%    BMI 25.61 kg/m   Physical Examination: General: Well developed, well nourished, NAD  HEENT: OP clear, mucus membranes moist  SKIN: warm, dry. No rashes. Neuro: No focal deficits  Musculoskeletal: Muscle strength 5/5 all ext  Psychiatric: Mood and affect normal  Neck: No JVD, no carotid bruits, no thyromegaly, no lymphadenopathy.  Lungs:Clear bilaterally, no wheezes, rhonci, crackles Cardiovascular: Regular rate and rhythm. No murmurs, gallops or rubs. Abdomen:Soft. Bowel sounds present. Non-tender.  Extremities: No lower extremity edema. Pulses are 2 + in the bilateral DP/PT.  EKG:  EKG is ordered today. The ekg ordered today demonstrates NSR, rate 60bpm.   Recent Labs: No results found for requested labs within last 8760 hours.   Lipid Panel    Component Value Date/Time   CHOL 205 (H) 03/16/2014 1152   TRIG 108 03/16/2014 1152   HDL 103 03/16/2014 1152   CHOLHDL 2.0 03/16/2014 1152   VLDL 22 03/16/2014 1152   LDLCALC 80 03/16/2014 1152     Wt Readings from Last 3 Encounters:  08/20/19 153 lb 14.2 oz (69.8 kg)  08/11/19 140 lb (63.5 kg)  07/28/19 140 lb (63.5 kg)     Assessment and Plan:   1. Palpitations:  Will arrange echo and 14 day Zio cardiac monitor.   Current medicines are reviewed at length with the patient today.  The patient does not have concerns regarding medicines.  The following changes have been made:  no change  Labs/ tests ordered today include:   Orders Placed This Encounter  Procedures   LONG TERM MONITOR (3-14 DAYS)   EKG 12-Lead   ECHOCARDIOGRAM COMPLETE     Disposition:   FU with me in 6 months.    Signed, Lauree Chandler, MD 08/20/2019 3:41 PM    [facility] [place], [place], [place]  [address] Phone: [phone]; Fax: [phone]

## 2019-08-20 NOTE — Patient Instructions (Signed)
Medication Instructions:  No changes *If you need a refill on your cardiac medications before your next appointment, please call your pharmacy*  Lab Work: none If you have labs (blood work) drawn today and your tests are completely normal, you will receive your results only by: Marland Kitchen MyChart Message (if you have MyChart) OR . A paper copy in the mail If you have any lab test that is abnormal or we need to change your treatment, we will call you to review the results.  Testing/Procedures: Your physician has requested that you have an echocardiogram. Echocardiography is a painless test that uses sound waves to create images of your heart. It provides your doctor with information about the size and shape of your heart and how well your heart's chambers and valves are working. This procedure takes approximately one hour. There are no restrictions for this procedure.  Your physician has recommended that you wear a Zio heart monitor. Heart monitors are medical devices that record the heart's electrical activity. Doctors most often use these monitors to diagnose arrhythmias. Arrhythmias are problems with the speed or rhythm of the heartbeat. The monitor is a small, portable device. You can wear one while you do your normal daily activities. This is usually used to diagnose what is causing palpitations/syncope (passing out).   Follow-Up: At [facility], you and your health needs are our priority.  As part of our continuing mission to provide you with exceptional heart care, we have created designated Provider Care Teams.  These Care Teams include your primary Cardiologist (physician) and Advanced Practice Providers (APPs -  Physician Assistants and Nurse Practitioners) who all work together to provide you with the care you need, when you need it.  Your next appointment:   6 month(s)  The format for your next appointment:   Either In Person or Virtual  Provider:   Lauree Chandler, MD  Other  Instructions

## 2019-08-25 ENCOUNTER — Ambulatory Visit: Payer: 59 | Admitting: Physical Therapy

## 2019-08-27 ENCOUNTER — Telehealth: Payer: Self-pay

## 2019-08-27 NOTE — Telephone Encounter (Signed)
LM for pt to put her monitor on after her Echo appt on 12/3.

## 2019-08-27 NOTE — Telephone Encounter (Signed)
14 day ZIO XT ordered and mailed to pt.  

## 2019-09-01 ENCOUNTER — Other Ambulatory Visit: Payer: Self-pay | Admitting: *Deleted

## 2019-09-01 DIAGNOSIS — S39012A Strain of muscle, fascia and tendon of lower back, initial encounter: Secondary | ICD-10-CM

## 2019-09-01 DIAGNOSIS — M542 Cervicalgia: Secondary | ICD-10-CM

## 2019-09-04 ENCOUNTER — Ambulatory Visit (HOSPITAL_COMMUNITY): Payer: 59 | Attending: Cardiovascular Disease

## 2019-09-04 ENCOUNTER — Other Ambulatory Visit: Payer: Self-pay

## 2019-09-04 DIAGNOSIS — R002 Palpitations: Secondary | ICD-10-CM | POA: Insufficient documentation

## 2019-09-08 DIAGNOSIS — H5213 Myopia, bilateral: Secondary | ICD-10-CM | POA: Diagnosis not present

## 2019-09-08 DIAGNOSIS — H524 Presbyopia: Secondary | ICD-10-CM | POA: Diagnosis not present

## 2019-09-10 ENCOUNTER — Ambulatory Visit (INDEPENDENT_AMBULATORY_CARE_PROVIDER_SITE_OTHER): Payer: 59

## 2019-09-10 DIAGNOSIS — R002 Palpitations: Secondary | ICD-10-CM

## 2019-09-10 NOTE — Therapy (Signed)
[place], [place], [address] Phone: [phone]   Fax:  [phone]  Physical Therapy Treatment/Discharge  Patient Details  Name: Kara Warren MRN: [id number] Date of Birth: 01-15-1956 Referring Provider (PT): Karlton Lemon, MD   Encounter Date: 08/11/2019    Past Medical History:  Diagnosis Date  . Abdominal pain, left upper quadrant   . Acute bronchospasm   . Acute neck pain 07/02/2019  . Acute sinusitis, unspecified 05/20/2009   Centricity Description: SINUSITIS - ACUTE-NOS Qualifier: Diagnosis of  By: Birdie Riddle MD, Belenda Cruise   Centricity Description: ACUTE SINUSITIS, UNSPECIFIED Qualifier: Diagnosis of  By: Alveta Heimlich MD, Cornelia Copa   Centricity Description: SINUSITIS, ACUTE Qualifier: Diagnosis of  By: Koleen Nimrod MD, Dellis Filbert    . Atypical chest pain 03/05/2013  . Body mass index (BMI) of 23.0-23.9 in adult   . Cervicalgia   . Colon polyp   . Costochondral junction syndrome   . Cough   . DEPRESSION 08/25/2008   Qualifier: Diagnosis of  By: Ronnald Ramp CMA, Chemira    . Diarrhea   . Diverticulosis 07/27/2012  . Dorsalgia   . Elevated blood pressure reading without diagnosis of hypertension   . Endometrial polyp 8/08   benign  . Family history of osteoporosis 12/28/2011   Mother pt had normal Dexa 11/2009   . Fatigue   . GERD (gastroesophageal reflux disease)   . HIP PAIN, RIGHT 04/12/2009   Qualifier: Diagnosis of  By: Oneida Alar MD, KARL    . History of hiatal hernia   . HYPERLIPIDEMIA 08/25/2008   Qualifier: Diagnosis of  By: Birdie Riddle MD, Belenda Cruise    . IBS (irritable bowel syndrome)   . Left hip pain 07/30/2013  . Left wrist pain 07/30/2013  . Lumbar strain, initial encounter 07/28/2019  . LUNG NODULE 11/19/2009   Neg CT    . 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 Menopause   . Migraine   . NEUTROPENIA UNSPECIFIED 10/07/2008   Qualifier: Diagnosis of  By: Birdie Riddle MD, Belenda Cruise    .  NONSPCIFC ABN FINDING RAD & OTH EXAM LUNG FIELD 11/19/2009   Qualifier: Diagnosis of  By: Alveta Heimlich MD, Cornelia Copa    . Nonspecific (abnormal) findings on radiological and other examination of body structure 11/19/2009   Qualifier: Diagnosis of By: Alveta Heimlich MD, Bennie Dallas list entry automatically replaced. Please review for accuracy.  . Otalgia, unspecified ear   . Pain in throat   . Palpitations   . Peroneal tendinitis of right lower extremity 03/12/2014  . Plantar wart 04/02/2014  . Pneumonia   . RHINITIS 06/21/2009   Qualifier: Diagnosis of  By: Birdie Riddle MD, Belenda Cruise    . Right ankle injury, subsequent encounter 01/11/2018  . Right foot pain 08/09/2011  . Sinusitis, chronic   . Skene's gland abscess 2009   patient unaware  . Sprain of unspecified ligament of right ankle, subsequent encounter   . TRANSAMINASES, SERUM, ELEVATED 12/07/2009   Qualifier: Diagnosis of  By: Inda Castle FNP, Wellington Hampshire   . Unspecified cataract   . Urinary tract infection, site not specified   . Vitamin D deficiency     Past Surgical History:  Procedure Laterality Date  . COLONOSCOPY    . DILATION AND CURETTAGE OF UTERUS  8/08  . GYNECOLOGIC CRYOSURGERY  1980  . HYSTEROSCOPY W/D&C N/A 03/30/2017   Procedure: DILATATION & CURETTAGE/HYSTEROSCOPY WITH ULTRASOUND GUIDANCE;  Surgeon: Megan Salon, MD;  Location: [facility];  Service: Gynecology;  Laterality:  N/A;  . UPPER GI ENDOSCOPY    . uterine ablation  2007    There were no vitals filed for this visit.                                 PT Long Term Goals - 07/30/19 1243      PT LONG TERM GOAL #1   Title  Independent with HEP    Time  6    Period  Weeks    Status  On-going      PT LONG TERM GOAL #2   Title  Improve FOTO oucome measure score to 30% or less impairment    Period  Weeks    Status  On-going      PT LONG TERM GOAL #3   Title  Increase right cervical rotation AROM at least 10 deg to improve ability to turn head while  driving    Time  6    Period  Weeks    Status  On-going      PT LONG TERM GOAL #4   Title  Tolerate sitting for computer work at least 40 min or greater with pain 2/10 or less    Time  6    Period  Weeks    Status  On-going      PT LONG TERM GOAL #5   Title  maintain current trunk ROM with no report of pain or limitations for QOL    Time  6    Period  Weeks    Status  On-going    Target Date  08/25/19              Patient will benefit from skilled therapeutic intervention in order to improve the following deficits and impairments:  Increased muscle spasms, Pain, Postural dysfunction, Increased fascial restricitons  Visit Diagnosis: Cervicalgia  Other muscle spasm  Acute right-sided low back pain, unspecified whether sciatica present  Acute left-sided low back pain, unspecified whether sciatica present     Problem List Patient Active Problem List   Diagnosis Date Noted  . Lumbar strain, initial encounter 07/28/2019  . Acute neck pain 07/02/2019  . Right ankle injury, subsequent encounter 01/11/2018  . Plantar wart 04/02/2014  . Peroneal tendinitis of right lower extremity 03/12/2014  . Left wrist pain 07/30/2013  . Left hip pain 07/30/2013  . Atypical chest pain 03/05/2013  . Diverticulosis 07/27/2012  . Family history of osteoporosis 12/28/2011  . Right foot pain 08/09/2011  . Sinusitis, chronic   . Colon polyp   . Menopause   . Migraine   . TRANSAMINASES, SERUM, ELEVATED 12/07/2009  . LUNG NODULE 11/19/2009  . Nonspecific (abnormal) findings on radiological and other examination of body structure 11/19/2009  . [place] LUNG FIELD 11/19/2009  . RHINITIS 06/21/2009  . Acute sinusitis, unspecified 05/20/2009  . HIP PAIN, RIGHT 04/12/2009  . NEUTROPENIA UNSPECIFIED 10/07/2008  . HYPERLIPIDEMIA 08/25/2008  . DEPRESSION 08/25/2008       PHYSICAL THERAPY DISCHARGE SUMMARY  Visits from Start of Care: 4  Current  functional level related to goals / functional outcomes: Patient did not return for further therapy after last visit 08/11/19-at the time patient was doing well/noting improvement-plan of care was left open in case of need for return but no further PT planned at this time. Patient to follow up with MD with any future changes in status.  Remaining deficits: NA   Education / Equipment: NA Plan: Patient agrees to discharge.  Patient goals were partially met. Patient is being discharged due to being pleased with the current functional level.  ?????             Beaulah Dinning, PT, DPT 09/10/19 9:57 AM      [facility] [address] [place], [place], [address] Phone: [phone]   Fax:  [phone]  Name: Kara Warren MRN: [id number] Date of Birth: 21-Nov-1955

## 2019-09-17 DIAGNOSIS — L821 Other seborrheic keratosis: Secondary | ICD-10-CM | POA: Diagnosis not present

## 2019-09-17 DIAGNOSIS — L814 Other melanin hyperpigmentation: Secondary | ICD-10-CM | POA: Diagnosis not present

## 2019-09-17 DIAGNOSIS — D485 Neoplasm of uncertain behavior of skin: Secondary | ICD-10-CM | POA: Diagnosis not present

## 2019-09-17 DIAGNOSIS — D229 Melanocytic nevi, unspecified: Secondary | ICD-10-CM | POA: Diagnosis not present

## 2019-09-17 DIAGNOSIS — L820 Inflamed seborrheic keratosis: Secondary | ICD-10-CM | POA: Diagnosis not present

## 2019-09-17 DIAGNOSIS — L819 Disorder of pigmentation, unspecified: Secondary | ICD-10-CM | POA: Diagnosis not present

## 2019-09-30 DIAGNOSIS — K219 Gastro-esophageal reflux disease without esophagitis: Secondary | ICD-10-CM | POA: Diagnosis not present

## 2019-09-30 DIAGNOSIS — M62830 Muscle spasm of back: Secondary | ICD-10-CM | POA: Diagnosis not present

## 2019-09-30 DIAGNOSIS — R1012 Left upper quadrant pain: Secondary | ICD-10-CM | POA: Diagnosis not present

## 2019-09-30 MED FILL — CYCLOBENZAPRINE 10 MG TAB: 10 | 15 days supply | Qty: 15 | Fill #0

## 2019-10-06 ENCOUNTER — Ambulatory Visit: Payer: 59 | Admitting: Family Medicine

## 2019-10-06 ENCOUNTER — Other Ambulatory Visit: Payer: Self-pay

## 2019-10-06 ENCOUNTER — Ambulatory Visit
Admission: RE | Admit: 2019-10-06 | Discharge: 2019-10-06 | Disposition: A | Discharge status: Home / Self Care | Payer: 59 | Source: Ambulatory Visit | Attending: Family Medicine | Admitting: Family Medicine

## 2019-10-06 ENCOUNTER — Other Ambulatory Visit: Payer: Self-pay | Admitting: Family Medicine

## 2019-10-06 VITALS — BP 118/84 | Ht 65.0 in | Wt 145.0 lb

## 2019-10-06 DIAGNOSIS — R0781 Pleurodynia: Secondary | ICD-10-CM

## 2019-10-06 NOTE — Patient Instructions (Signed)
This is consistent with an oblique strain. Get x-rays of your left ribs as a precaution. Also a urinalysis up at family practice after you leave today. Continue follow up with GI including the colonoscopy Friday. Tylenol 500mg  1-2 tabs three times a day as needed. Voltaren gel or biofreeze up to 4 times a day topically. Follow up with me as needed otherwise. Activities as tolerated.

## 2019-10-06 NOTE — Progress Notes (Signed)
PCP: Fanny Bien, MD  Subjective:   HPI: Patient is a 64 y.o. female here for left side pain.  Patient reports for about 10 days she's had pain on left side of lower ribs and abdomen. No known injury or trauma. She's active and does pilates but no issues with this. She was evaluated by GI and has a screening colonoscopy coming up Friday but felt to be due to a non-GI cause. Radiation into the upper back. No dysuria, hematuria, fevers, chills, sweats, nausea, vomiting, diarrhea, melena, hematochezia. No rash. Standing eases the pain some. No pain with a deep breath. No night pain.  Past Medical History:  Diagnosis Date  . Abdominal pain, left upper quadrant   . Acute bronchospasm   . Acute neck pain 07/02/2019  . Acute sinusitis, unspecified 05/20/2009   Centricity Description: SINUSITIS - ACUTE-NOS Qualifier: Diagnosis of  By: Birdie Riddle MD, Belenda Cruise   Centricity Description: ACUTE SINUSITIS, UNSPECIFIED Qualifier: Diagnosis of  By: Alveta Heimlich MD, Cornelia Copa   Centricity Description: SINUSITIS, ACUTE Qualifier: Diagnosis of  By: Koleen Nimrod MD, Dellis Filbert    . Atypical chest pain 03/05/2013  . Body mass index (BMI) of 23.0-23.9 in adult   . Cervicalgia   . Colon polyp   . Costochondral junction syndrome   . Cough   . DEPRESSION 08/25/2008   Qualifier: Diagnosis of  By: Ronnald Ramp CMA, Chemira    . Diarrhea   . Diverticulosis 07/27/2012  . Dorsalgia   . Elevated blood pressure reading without diagnosis of hypertension   . Endometrial polyp 8/08   benign  . Family history of osteoporosis 12/28/2011   Mother pt had normal Dexa 11/2009   . Fatigue   . GERD (gastroesophageal reflux disease)   . HIP PAIN, RIGHT 04/12/2009   Qualifier: Diagnosis of  By: Oneida Alar MD, KARL    . History of hiatal hernia   . HYPERLIPIDEMIA 08/25/2008   Qualifier: Diagnosis of  By: Birdie Riddle MD, Belenda Cruise    . IBS (irritable bowel syndrome)   . Left hip pain 07/30/2013  . Left wrist pain 07/30/2013  . Lumbar strain,  initial encounter 07/28/2019  . LUNG NODULE 11/19/2009   Neg CT    . 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 Menopause   . Migraine   . NEUTROPENIA UNSPECIFIED 10/07/2008   Qualifier: Diagnosis of  By: Birdie Riddle MD, Belenda Cruise    . NONSPCIFC ABN FINDING RAD & OTH EXAM LUNG FIELD 11/19/2009   Qualifier: Diagnosis of  By: Alveta Heimlich MD, Cornelia Copa    . Nonspecific (abnormal) findings on radiological and other examination of body structure 11/19/2009   Qualifier: Diagnosis of By: Alveta Heimlich MD, Bennie Dallas list entry automatically replaced. Please review for accuracy.  . Otalgia, unspecified ear   . Pain in throat   . Palpitations   . Peroneal tendinitis of right lower extremity 03/12/2014  . Plantar wart 04/02/2014  . Pneumonia   . RHINITIS 06/21/2009   Qualifier: Diagnosis of  By: Birdie Riddle MD, Belenda Cruise    . Right ankle injury, subsequent encounter 01/11/2018  . Right foot pain 08/09/2011  . Sinusitis, chronic   . Skene's gland abscess 2009   patient unaware  . Sprain of unspecified ligament of right ankle, subsequent encounter   . TRANSAMINASES, SERUM, ELEVATED 12/07/2009   Qualifier: Diagnosis of  By: Inda Castle FNP, Wellington Hampshire   . Unspecified cataract   . Urinary tract infection, site not specified   . Vitamin D deficiency  Current Outpatient Medications on File Prior to Visit  Medication Sig Dispense Refill  . citalopram (CELEXA) 20 MG tablet TAKE 1 TABLET BY MOUTH ONCE DAILY 90 tablet 3  . famotidine (PEPCID) 20 MG tablet Take 20 mg by mouth as needed for heartburn or indigestion.    . montelukast (SINGULAIR) 10 MG tablet     . Multiple Vitamins-Minerals (MULTI FOR HER 50+) TABS Take 1 tablet by mouth daily.      . Probiotic Product (PROBIOTIC PO) Take 1 capsule by mouth daily. Called ultra spectrum    . vitamin E 400 UNIT capsule Take 400 Units by mouth daily.    Marland Kitchen albuterol (VENTOLIN HFA) 108 (90 Base) MCG/ACT inhaler Inhale 1-2 puffs into the lungs every 6 (six) hours as  needed for wheezing or shortness of breath.    Marland Kitchen azelastine (ASTELIN) 0.1 % nasal spray daily as needed.     . benzonatate (TESSALON) 200 MG capsule Take 200 mg by mouth 3 (three) times daily as needed for cough.    . Cholecalciferol (VITAMIN D) 50 MCG (2000 UT) tablet Take 2,000 Units by mouth daily.    . cyclobenzaprine (FLEXERIL) 5 MG tablet Take 5 mg by mouth 1 day or 1 dose. At bedtime as needed    . diclofenac sodium (VOLTAREN) 1 % GEL APPLY 2 TO 4 GRAMS TOPICALLY TO AFFECTED AREA 4 TIMES PER DAY WHEN NEEDED  3  . ipratropium (ATROVENT) 0.06 % nasal spray Place 2 sprays into both nostrils 3 (three) times daily.    Marland Kitchen levocetirizine (XYZAL) 5 MG tablet Take 5 mg by mouth every evening.    . meloxicam (MOBIC) 15 MG tablet TAKE 1 TABLET DAILY WITH FOOD FOR 7-14 DAYS. THEN TAKE AS NEEDED. (Patient not taking: Reported on 10/06/2019) 40 tablet 0   No current facility-administered medications on file prior to visit.    Past Surgical History:  Procedure Laterality Date  . COLONOSCOPY    . DILATION AND CURETTAGE OF UTERUS  8/08  . GYNECOLOGIC CRYOSURGERY  1980  . HYSTEROSCOPY WITH D & C N/A 03/30/2017   Procedure: DILATATION & CURETTAGE/HYSTEROSCOPY WITH ULTRASOUND GUIDANCE;  Surgeon: Megan Salon, MD;  Location: [facility];  Service: Gynecology;  Laterality: N/A;  . UPPER GI ENDOSCOPY    . uterine ablation  2007    No Known Allergies  Social History   Socioeconomic History  . Marital status: Single    Spouse name: Not on file  . Number of children: 0  . Years of education: Not on file  . Highest education level: Not on file  Occupational History  . Occupation: Programmer, multimedia: [employer]  Tobacco Use  . Smoking status: Former Smoker    Packs/day: 0.10    Years: 3.00    Pack years: 0.30    Types: Cigarettes    Quit date: 10/02/1978    Years since quitting: 41.0  . Smokeless tobacco: Never Used  Substance and Sexual Activity  . Alcohol use: Yes    Alcohol/week: 7.0 standard  drinks    Types: 7 Glasses of wine per week    Comment: per week  . Drug use: No  . Sexual activity: Not Currently    Birth control/protection: Post-menopausal  Other Topics Concern  . Not on file  Social History Narrative  . Not on file   Social Determinants of Health   Financial Resource Strain:   . Difficulty of Paying Living Expenses: Not on file  Food  Insecurity:   . Worried About Charity fundraiser in the Last Year: Not on file  . Ran Out of Food in the Last Year: Not on file  Transportation Needs:   . Lack of Transportation (Medical): Not on file  . Lack of Transportation (Non-Medical): Not on file  Physical Activity:   . Days of Exercise per Week: Not on file  . Minutes of Exercise per Session: Not on file  Stress:   . Feeling of Stress : Not on file  Social Connections:   . Frequency of Communication with Friends and Family: Not on file  . Frequency of Social Gatherings with Friends and Family: Not on file  . Attends Religious Services: Not on file  . Active Member of Clubs or Organizations: Not on file  . Attends Archivist Meetings: Not on file  . Marital Status: Not on file  Intimate Partner Violence:   . Fear of Current or Ex-Partner: Not on file  . Emotionally Abused: Not on file  . Physically Abused: Not on file  . Sexually Abused: Not on file    Family History  Problem Relation Age of Onset  . Colon polyps Father   . Ulcerative colitis Father   . CAD Father        Stent placement at age 48  . Glaucoma Father   . Hypertension Father   . Colitis Father   . Breast cancer Paternal Grandmother   . Colon cancer Maternal Grandfather   . Colon polyps Mother   . Osteoporosis Mother   . Thyroid disease Mother        hypo  . Glaucoma Mother   . Diverticulosis Mother   . Sudden death Neg Hx   . Hyperlipidemia Neg Hx   . Heart attack Neg Hx   . Diabetes Neg Hx     BP 118/84   Ht 5\' 5"  (1.651 m)   Wt 145 lb (65.8 kg)   LMP 10/02/2006  (Approximate)   BMI 24.13 kg/m   Review of Systems: See HPI above.     Objective:  Physical Exam:  Gen: NAD, comfortable in exam room  Chest: Lungs CTAB without wheezes, rales, rhonchi.  MSK:  No palpable stepoffs.  No swelling, crepitus noted. TTP mid-distal left ribs about 6-12 including mildly over obliques inferior to this.  FROM with trunk rotation, flexion, and extension.  Mild pain with trunk rotation. Strength normal in extremities.  Abd: soft, nt, nd, no HSM.   Assessment & Plan:  1. Left thoracic/abdominal pain - Patient's exam is reassuring.  Consistent with oblique strain.  As a precaution will get radiographs of left sided ribs.  Being followed by GI as well and has her screening colonoscopy with them.  Will get a UA to ensure no blood, LE.  Tylenol, topical medications reviewed, icing.  Reassured otherwise and rehab is difficult for oblique strains but can consider PT if she's struggling.

## 2019-10-07 ENCOUNTER — Telehealth: Payer: Self-pay | Admitting: Cardiovascular Disease

## 2019-10-07 ENCOUNTER — Encounter: Payer: Self-pay | Admitting: Family Medicine

## 2019-10-07 DIAGNOSIS — R002 Palpitations: Secondary | ICD-10-CM | POA: Diagnosis not present

## 2019-10-07 NOTE — Telephone Encounter (Signed)
Tremaine from [facility] calling with an abnormal ZIO patch reading.

## 2019-10-07 NOTE — Telephone Encounter (Signed)
See other phone encounter from today 10/07/19 for further detail.

## 2019-10-07 NOTE — Telephone Encounter (Signed)
I spoke to the patient about her monitor results and scheduled her with Dr Angelena Form on 1/7 @ 2:20 pm to discuss medical therapy.

## 2019-10-07 NOTE — Telephone Encounter (Signed)
Left message for patient updating that monitor has been received and once reviewed we will be back in touch with her.

## 2019-10-07 NOTE — Telephone Encounter (Signed)
New message     Patient is scheduled to have a colonoscopy on Friday, Jan 8th.  She want to know if we have her holter monitor results to give to the GI doctor. Based on the results, she may have to reschedule her test. Please call and let her know.

## 2019-10-07 NOTE — Telephone Encounter (Signed)
Monitor received from [facility].  It is actually the final report and not a particular event.  Will place in Dr. Camillia Herter basket for him to review.  Preliminary findings on monitor does mention 1 run of VT lasting 7 beats with max HR of 187.  4 runs of SVT with longest being 6 beats.  Afib/flutter occurring less than 1% of the time with rates from 107-226.  Longest episode of Afib/flujtter was 2 hrs 25 mins.    Will route to Dr. Angelena Form and his nurse to make them aware as pt called earlier (see other phone note) inquiring about a colonoscopy she is to have on Friday.

## 2019-10-07 NOTE — Telephone Encounter (Signed)
I spoke to [facility] and they will fax report to [phone].

## 2019-10-07 NOTE — Telephone Encounter (Signed)
Monitor was imported into patients chart and assigned to [facility]

## 2019-10-07 NOTE — Telephone Encounter (Signed)
Cardiac monitor with atrial flutter/fib, SVT and one 7 beat run of VT. She will need to be seen in clinic this week by me or an APP on my care team to discuss medical therapy and starting anticoagulation. Gerald Stabs

## 2019-10-09 ENCOUNTER — Ambulatory Visit: Payer: 59 | Admitting: Cardiovascular Disease

## 2019-10-09 ENCOUNTER — Other Ambulatory Visit: Payer: Self-pay

## 2019-10-09 ENCOUNTER — Encounter: Payer: Self-pay | Admitting: Cardiovascular Disease

## 2019-10-09 VITALS — BP 122/82 | HR 67 | Ht 65.0 in | Wt 151.2 lb

## 2019-10-09 DIAGNOSIS — I480 Paroxysmal atrial fibrillation: Secondary | ICD-10-CM

## 2019-10-09 MED ORDER — METOPROLOL SUCCINATE ER 25 MG PO TB24: 25.0000 mg | ORAL_TABLET | Freq: Every day | ORAL | 3 refills | Status: DC

## 2019-10-09 MED ORDER — APIXABAN 5 MG PO TABS: 5.0000 mg | ORAL_TABLET | Freq: Two times a day (BID) | ORAL | 6 refills | Status: DC

## 2019-10-09 MED FILL — METOPROLOL SUCCINATE ER 25: 25 | 90 days supply | Qty: 90 | Fill #0

## 2019-10-09 NOTE — Progress Notes (Signed)
Chief Complaint  Patient presents with  . Follow-up    atrial fibrillation   History of Present Illness: 64 yo female with history of depression, irritable bowel syndrome, hiatal hernia, GERD, hyperlipidemia and migraine headaches who is here today for cardiac follow up. She was seen as a new patient 08/20/19 with c/o palpitations. She was seen in the [facility] ED 02/26/13 with c/o constant left sided chest pain. Troponin negative x 2. EKG without ischemic changes. I saw her in the office in 2014 after her ED visit. . Her pain was felt to be non-cardiac. Echo June 2014 with LVEF=55-60%, no valve disease. Exercise stress test 2014 with no ischemic EKG changes. With most recent palpitations, I arranged an echo and a cardiac monitor. Echo 09/04/19 showed [id number], grade 1 diastolic dysfunction. There was no significant valve disease. Cardiac monitor with atrial fibrillation, SVT and one 7 beat run of VT.   She is here today for follow up. The patient denies any chest pain, dyspnea, lower extremity edema, orthopnea, PND, dizziness, near syncope or syncope. She feels well overall. Occasional palpitations.    Primary Care Physician: Fanny Bien, MD   Past Medical History:  Diagnosis Date  . Abdominal pain, left upper quadrant   . Acute bronchospasm   . Acute neck pain 07/02/2019  . Acute sinusitis, unspecified 05/20/2009   Centricity Description: SINUSITIS - ACUTE-NOS Qualifier: Diagnosis of  By: Birdie Riddle MD, Belenda Cruise   Centricity Description: ACUTE SINUSITIS, UNSPECIFIED Qualifier: Diagnosis of  By: Alveta Heimlich MD, Cornelia Copa   Centricity Description: SINUSITIS, ACUTE Qualifier: Diagnosis of  By: Koleen Nimrod MD, Dellis Filbert    . Atypical chest pain 03/05/2013  . Body mass index (BMI) of 23.0-23.9 in adult   . Cervicalgia   . Colon polyp   . Costochondral junction syndrome   . Cough   . DEPRESSION 08/25/2008   Qualifier: Diagnosis of  By: Ronnald Ramp CMA, Chemira    . Diarrhea   . Diverticulosis 07/27/2012  .  Dorsalgia   . Elevated blood pressure reading without diagnosis of hypertension   . Endometrial polyp 8/08   benign  . Family history of osteoporosis 12/28/2011   Mother pt had normal Dexa 11/2009   . Fatigue   . GERD (gastroesophageal reflux disease)   . HIP PAIN, RIGHT 04/12/2009   Qualifier: Diagnosis of  By: Oneida Alar MD, KARL    . History of hiatal hernia   . HYPERLIPIDEMIA 08/25/2008   Qualifier: Diagnosis of  By: Birdie Riddle MD, Belenda Cruise    . IBS (irritable bowel syndrome)   . Left hip pain 07/30/2013  . Left wrist pain 07/30/2013  . Lumbar strain, initial encounter 07/28/2019  . LUNG NODULE 11/19/2009   Neg CT    . 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 Menopause   . Migraine   . NEUTROPENIA UNSPECIFIED 10/07/2008   Qualifier: Diagnosis of  By: Birdie Riddle MD, Belenda Cruise    . NONSPCIFC ABN FINDING RAD & OTH EXAM LUNG FIELD 11/19/2009   Qualifier: Diagnosis of  By: Alveta Heimlich MD, Cornelia Copa    . Nonspecific (abnormal) findings on radiological and other examination of body structure 11/19/2009   Qualifier: Diagnosis of By: Alveta Heimlich MD, Bennie Dallas list entry automatically replaced. Please review for accuracy.  . Otalgia, unspecified ear   . Pain in throat   . Palpitations   . Peroneal tendinitis of right lower extremity 03/12/2014  . Plantar wart 04/02/2014  . Pneumonia   . RHINITIS 06/21/2009   Qualifier:  Diagnosis of  By: Birdie Riddle MD, Belenda Cruise    . Right ankle injury, subsequent encounter 01/11/2018  . Right foot pain 08/09/2011  . Sinusitis, chronic   . Skene's gland abscess 2009   patient unaware  . Sprain of unspecified ligament of right ankle, subsequent encounter   . TRANSAMINASES, SERUM, ELEVATED 12/07/2009   Qualifier: Diagnosis of  By: Inda Castle FNP, Wellington Hampshire   . Unspecified cataract   . Urinary tract infection, site not specified   . Vitamin D deficiency     Past Surgical History:  Procedure Laterality Date  . COLONOSCOPY    . DILATION AND CURETTAGE OF UTERUS   8/08  . GYNECOLOGIC CRYOSURGERY  1980  . HYSTEROSCOPY WITH D & C N/A 03/30/2017   Procedure: DILATATION & CURETTAGE/HYSTEROSCOPY WITH ULTRASOUND GUIDANCE;  Surgeon: Megan Salon, MD;  Location: [facility];  Service: Gynecology;  Laterality: N/A;  . UPPER GI ENDOSCOPY    . uterine ablation  2007    Current Outpatient Medications  Medication Sig Dispense Refill  . azelastine (ASTELIN) 0.1 % nasal spray daily as needed.     . [place]well[place]-Glucosamine-Vit D (OSTEO BI-FLEX ONE PER DAY PO) Take 1 tablet by mouth daily.    . Cholecalciferol (VITAMIN D) 50 MCG (2000 UT) tablet Take 2,000 Units by mouth daily.    . citalopram (CELEXA) 20 MG tablet TAKE 1 TABLET BY MOUTH ONCE DAILY 90 tablet 3  . diclofenac sodium (VOLTAREN) 1 % GEL APPLY 2 TO 4 GRAMS TOPICALLY TO AFFECTED AREA 4 TIMES PER DAY WHEN NEEDED  3  . famotidine (PEPCID) 20 MG tablet Take 20 mg by mouth as needed for heartburn or indigestion.    . meloxicam (MOBIC) 15 MG tablet Take 15 mg by mouth as needed for pain.    . montelukast (SINGULAIR) 10 MG tablet Take 10 mg by mouth as needed.     . Multiple Vitamins-Minerals (MULTI FOR HER 50+) TABS Take 1 tablet by mouth daily.      . Omega-3 Fatty Acids (FISH OIL PO) Take 1 capsule by mouth daily.    . Probiotic Product (PROBIOTIC PO) Take 1 capsule by mouth daily. Called ultra spectrum    . vitamin E 400 UNIT capsule Take 400 Units by mouth daily.    Marland Kitchen apixaban (ELIQUIS) 5 MG TABS tablet Take 1 tablet (5 mg total) by mouth 2 (two) times daily. 60 tablet 6  . metoprolol succinate (TOPROL-XL) 25 MG 24 hr tablet Take 1 tablet (25 mg total) by mouth daily. 90 tablet 3   No current facility-administered medications for this visit.    No Known Allergies  Social History   Socioeconomic History  . Marital status: Single    Spouse name: Not on file  . Number of children: 0  . Years of education: Not on file  . Highest education level: Not on file  Occupational History  . Occupation: Facilities manager: [employer]  Tobacco Use  . Smoking status: Former Smoker    Packs/day: 0.10    Years: 3.00    Pack years: 0.30    Types: Cigarettes    Quit date: 10/02/1978    Years since quitting: 41.0  . Smokeless tobacco: Never Used  Substance and Sexual Activity  . Alcohol use: Yes    Alcohol/week: 7.0 standard drinks    Types: 7 Glasses of wine per week    Comment: per week  . Drug use: No  . Sexual activity: Not Currently  Birth control/protection: Post-menopausal  Other Topics Concern  . Not on file  Social History Narrative  . Not on file   Social Determinants of Health   Financial Resource Strain:   . Difficulty of Paying Living Expenses: Not on file  Food Insecurity:   . Worried About Charity fundraiser in the Last Year: Not on file  . Ran Out of Food in the Last Year: Not on file  Transportation Needs:   . Lack of Transportation (Medical): Not on file  . Lack of Transportation (Non-Medical): Not on file  Physical Activity:   . Days of Exercise per Week: Not on file  . Minutes of Exercise per Session: Not on file  Stress:   . Feeling of Stress : Not on file  Social Connections:   . Frequency of Communication with Friends and Family: Not on file  . Frequency of Social Gatherings with Friends and Family: Not on file  . Attends Religious Services: Not on file  . Active Member of Clubs or Organizations: Not on file  . Attends Archivist Meetings: Not on file  . Marital Status: Not on file  Intimate Partner Violence:   . Fear of Current or Ex-Partner: Not on file  . Emotionally Abused: Not on file  . Physically Abused: Not on file  . Sexually Abused: Not on file    Family History  Problem Relation Age of Onset  . Colon polyps Father   . Ulcerative colitis Father   . CAD Father        Stent placement at age 6  . Glaucoma Father   . Hypertension Father   . Colitis Father   . Breast cancer Paternal Grandmother   . Colon cancer Maternal  Grandfather   . Colon polyps Mother   . Osteoporosis Mother   . Thyroid disease Mother        hypo  . Glaucoma Mother   . Diverticulosis Mother   . Sudden death Neg Hx   . Hyperlipidemia Neg Hx   . Heart attack Neg Hx   . Diabetes Neg Hx     Review of Systems:  As stated in the HPI and otherwise negative.   BP 122/82   Pulse 67   Ht 5\' 5"  (1.651 m)   Wt 151 lb 3.2 oz (68.6 kg)   LMP 10/02/2006 (Approximate)   SpO2 98%   BMI 25.16 kg/m   Physical Examination:  General: Well developed, well nourished, NAD  HEENT: OP clear, mucus membranes moist  SKIN: warm, dry. No rashes. Neuro: No focal deficits  Musculoskeletal: Muscle strength 5/5 all ext  Psychiatric: Mood and affect normal  Neck: No JVD, no carotid bruits, no thyromegaly, no lymphadenopathy.  Lungs:Clear bilaterally, no wheezes, rhonci, crackles Cardiovascular: Regular rate and rhythm. No murmurs, gallops or rubs. Abdomen:Soft. Bowel sounds present. Non-tender.  Extremities: No lower extremity edema. Pulses are 2 + in the bilateral DP/PT.  EKG:  EKG is ordered today. The ekg ordered today demonstrates Sinus rhythm  Recent Labs: No results found for requested labs within last 8760 hours.   Lipid Panel    Component Value Date/Time   CHOL 205 (H) 03/16/2014 1152   TRIG 108 03/16/2014 1152   HDL 103 03/16/2014 1152   CHOLHDL 2.0 03/16/2014 1152   VLDL 22 03/16/2014 1152   LDLCALC 80 03/16/2014 1152     Wt Readings from Last 3 Encounters:  10/09/19 151 lb 3.2 oz (68.6 kg)  10/06/19 145 lb (  65.8 kg)  08/20/19 153 lb 14.2 oz (69.8 kg)     Assessment and Plan:   1. Paroxysmal atrial fibrillation: Stroke risk reviewed with pt. CHADS VASC score of 1. She will be 64 in 5 months which would make her score 2. She would like to start Eliquis. Will start Eliquis 5 mg BID. Will also start Toprol 25 mg daily. She may do well with flecainide in the future. Follow up in 2 weeks in atrial fibrillation clinic.    Current medicines are reviewed at length with the patient today.  The patient does not have concerns regarding medicines.  The following changes have been made:  no change  Labs/ tests ordered today include:   Orders Placed This Encounter  Procedures  . EKG 12-Lead     Disposition:   FU with me in 6 months.    Signed, Lauree Chandler, MD 10/09/2019 3:57 PM    [facility] [place], [place], [place]  [address] Phone: [phone]; Fax: [phone]

## 2019-10-09 NOTE — Patient Instructions (Addendum)
Medication Instructions:  Your physician has recommended you make the following change in your medication:  1.) start metoprolol succinate 25 mg (Toprol XL) take one tablet by mouth daily 2.) start apixaban 5 mg (Eliquis) take one tablet by mouth twice a day  *If you need a refill on your cardiac medications before your next appointment, please call your pharmacy*  Lab Work: none If you have labs (blood work) drawn today and your tests are completely normal, you will receive your results only by: Marland Kitchen MyChart Message (if you have MyChart) OR . A paper copy in the mail If you have any lab test that is abnormal or we need to change your treatment, we will call you to review the results.  Testing/Procedures: none  Follow-Up: Your next visit will be with the afib clinic in about 2-3 weeks.  Other Instructions

## 2019-10-10 LAB — URINALYSIS

## 2019-10-13 ENCOUNTER — Telehealth: Payer: Self-pay | Admitting: *Deleted

## 2019-10-13 NOTE — Telephone Encounter (Signed)
Patient returned call and will come in tomorrow to give another sample. Jaymes Graff Rayhana Slider

## 2019-10-13 NOTE — Telephone Encounter (Signed)
Left message for patient to return call. [facility] mishandled her urine specimen and it needs to be recollected at her convenience. Jaymes Graff Lynnix Schoneman

## 2019-10-14 ENCOUNTER — Other Ambulatory Visit: Payer: Self-pay | Admitting: *Deleted

## 2019-10-14 DIAGNOSIS — R0781 Pleurodynia: Secondary | ICD-10-CM

## 2019-10-15 MED FILL — CITALOPRAM HBR 20 MG TABLET: 20 | 90 days supply | Qty: 90 | Fill #2

## 2019-10-17 ENCOUNTER — Telehealth: Payer: Self-pay | Admitting: *Deleted

## 2019-10-17 NOTE — Telephone Encounter (Signed)
Called [facility] to ask for results on a repeat urinalysis after the original sample was not able to be performed due to "specimen deterioration during transport". They informed me the second sample was sent to the wrong department and can not be performed now because that sample is no longer viable since it is over 18 hrs old and a third sample will have to be collected. Patient will be notified of this. Jaymes Graff Sheza Strickland

## 2019-10-20 ENCOUNTER — Other Ambulatory Visit (INDEPENDENT_AMBULATORY_CARE_PROVIDER_SITE_OTHER): Payer: 59 | Admitting: *Deleted

## 2019-10-20 ENCOUNTER — Other Ambulatory Visit: Payer: Self-pay

## 2019-10-20 ENCOUNTER — Telehealth: Payer: Self-pay | Admitting: *Deleted

## 2019-10-20 DIAGNOSIS — R0781 Pleurodynia: Secondary | ICD-10-CM | POA: Diagnosis not present

## 2019-10-20 LAB — POCT URINALYSIS DIP (MANUAL ENTRY)
Bilirubin, UA: NEGATIVE
Glucose, UA: NEGATIVE mg/dL
Ketones, POC UA: NEGATIVE mg/dL
Leukocytes, UA: NEGATIVE
Nitrite, UA: NEGATIVE
Protein Ur, POC: NEGATIVE mg/dL
Spec Grav, UA: 1.01 (ref 1.010–1.025)
Urobilinogen, UA: 0.2 E.U./dL
pH, UA: 5.5 (ref 5.0–8.0)

## 2019-10-20 NOTE — Telephone Encounter (Signed)
Called and spoke with patient to explain that [facility] mishandled her second UA specimen and would need to collect a third sample. I asked the patient if she could come in for a third sample and that I would run the sample here at [facility] immediately after collection and not send it to [facility] since they mishandled the first two. She said she can come by sometime today or tomorrow. Kara Warren [name]

## 2019-10-20 NOTE — Telephone Encounter (Signed)
Please go ahead with CT of chest without contrast to assess for stress fracture, bony lesion.  Thanks!

## 2019-10-21 ENCOUNTER — Other Ambulatory Visit: Payer: Self-pay

## 2019-10-21 ENCOUNTER — Ambulatory Visit (HOSPITAL_BASED_OUTPATIENT_CLINIC_OR_DEPARTMENT_OTHER)
Admission: RE | Admit: 2019-10-21 | Discharge: 2019-10-21 | Disposition: A | Discharge status: Home / Self Care | Payer: 59 | Source: Ambulatory Visit | Attending: Family Medicine | Admitting: Family Medicine

## 2019-10-21 DIAGNOSIS — K219 Gastro-esophageal reflux disease without esophagitis: Secondary | ICD-10-CM | POA: Diagnosis not present

## 2019-10-21 DIAGNOSIS — R0781 Pleurodynia: Secondary | ICD-10-CM | POA: Diagnosis not present

## 2019-10-21 DIAGNOSIS — F419 Anxiety disorder, unspecified: Secondary | ICD-10-CM | POA: Diagnosis not present

## 2019-10-21 DIAGNOSIS — I4891 Unspecified atrial fibrillation: Secondary | ICD-10-CM | POA: Diagnosis not present

## 2019-10-21 DIAGNOSIS — R109 Unspecified abdominal pain: Secondary | ICD-10-CM | POA: Diagnosis not present

## 2019-10-21 LAB — URINALYSIS

## 2019-10-21 MED FILL — FAMOTIDINE 20 MG TABS: 20 | 45 days supply | Qty: 90 | Fill #0

## 2019-10-21 MED FILL — OMEPRAZOLE 40 MG CPDR: 40 | 90 days supply | Qty: 90 | Fill #0

## 2019-10-22 ENCOUNTER — Other Ambulatory Visit (HOSPITAL_BASED_OUTPATIENT_CLINIC_OR_DEPARTMENT_OTHER): Payer: Self-pay | Admitting: Family Medicine

## 2019-10-27 ENCOUNTER — Encounter (HOSPITAL_COMMUNITY): Payer: Self-pay | Admitting: Nurse Practitioner

## 2019-10-27 ENCOUNTER — Ambulatory Visit (HOSPITAL_COMMUNITY)
Admission: RE | Admit: 2019-10-27 | Discharge: 2019-10-27 | Disposition: A | Discharge status: Home / Self Care | Payer: 59 | Source: Ambulatory Visit | Attending: Nurse Practitioner | Admitting: Nurse Practitioner

## 2019-10-27 ENCOUNTER — Other Ambulatory Visit: Payer: Self-pay

## 2019-10-27 VITALS — BP 126/82 | HR 56 | Ht 65.0 in | Wt 150.4 lb

## 2019-10-27 DIAGNOSIS — D6869 Other thrombophilia: Secondary | ICD-10-CM

## 2019-10-27 DIAGNOSIS — I480 Paroxysmal atrial fibrillation: Secondary | ICD-10-CM | POA: Diagnosis not present

## 2019-10-27 DIAGNOSIS — Z0000 Encounter for general adult medical examination without abnormal findings: Secondary | ICD-10-CM | POA: Diagnosis not present

## 2019-10-27 DIAGNOSIS — Z8249 Family history of ischemic heart disease and other diseases of the circulatory system: Secondary | ICD-10-CM | POA: Diagnosis not present

## 2019-10-27 DIAGNOSIS — Z87891 Personal history of nicotine dependence: Secondary | ICD-10-CM | POA: Insufficient documentation

## 2019-10-27 DIAGNOSIS — Z7901 Long term (current) use of anticoagulants: Secondary | ICD-10-CM | POA: Diagnosis not present

## 2019-10-27 DIAGNOSIS — K219 Gastro-esophageal reflux disease without esophagitis: Secondary | ICD-10-CM | POA: Insufficient documentation

## 2019-10-27 DIAGNOSIS — Z20828 Contact with and (suspected) exposure to other viral communicable diseases: Secondary | ICD-10-CM | POA: Diagnosis not present

## 2019-10-27 DIAGNOSIS — E559 Vitamin D deficiency, unspecified: Secondary | ICD-10-CM | POA: Insufficient documentation

## 2019-10-27 DIAGNOSIS — Z79899 Other long term (current) drug therapy: Secondary | ICD-10-CM | POA: Insufficient documentation

## 2019-10-27 MED ORDER — DILTIAZEM HCL 30 MG PO TABS: ORAL_TABLET | ORAL | 3 refills | Status: DC

## 2019-10-27 MED FILL — dilTIAZem HCL 30 MG TABS: 30 | 7 days supply | Qty: 45 | Fill #0

## 2019-10-27 MED FILL — ELIQUIS 5 MG TABLET: 5 | 30 days supply | Qty: 60 | Fill #0

## 2019-10-27 NOTE — Patient Instructions (Signed)
Diltiazem:Take 1 Tablet Every 4 Hours As Needed For HR >100 and Top BP >100

## 2019-10-27 NOTE — Progress Notes (Signed)
Primary Care Physician: Fanny Bien, MD Referring Physician:Dr. Santa Genera is a 64 y.o. female without significant health issues who recently wore a monitor for c/o's palpitations which did show afib/flutter with longest duration 2 hours, 25 mins. Also had several short bursts of SVT.   She saw Dr. Angelena Form on f/u and was started on metoprolol er 25 mg day and also  eliquis 5 mg bid for a CHA2DS2VASc score of 2. Echo showed EF of 60-65%. She  has a CHA2DS2VASc score of 2(female, age 66 in July). EKG today shows SR.   Denies excessive caffeine use, no tobacco, lives alone but does not believe she has significant snoring issues. Drinks a glass of wine nightly.   Today, she denies symptoms of palpitations, chest pain, shortness of breath, orthopnea, PND, lower extremity edema, dizziness, presyncope, syncope, or neurologic sequela. The patient is tolerating medications without difficulties and is otherwise without complaint today.   Past Medical History:  Diagnosis Date  . Abdominal pain, left upper quadrant   . Acute bronchospasm   . Acute neck pain 07/02/2019  . Acute sinusitis, unspecified 05/20/2009   Centricity Description: SINUSITIS - ACUTE-NOS Qualifier: Diagnosis of  By: Birdie Riddle MD, Belenda Cruise   Centricity Description: ACUTE SINUSITIS, UNSPECIFIED Qualifier: Diagnosis of  By: Alveta Heimlich MD, Cornelia Copa   Centricity Description: SINUSITIS, ACUTE Qualifier: Diagnosis of  By: Koleen Nimrod MD, Dellis Filbert    . Atypical chest pain 03/05/2013  . Body mass index (BMI) of 23.0-23.9 in adult   . Cervicalgia   . Colon polyp   . Costochondral junction syndrome   . Cough   . DEPRESSION 08/25/2008   Qualifier: Diagnosis of  By: Ronnald Ramp CMA, Chemira    . Diarrhea   . Diverticulosis 07/27/2012  . Dorsalgia   . Elevated blood pressure reading without diagnosis of hypertension   . Endometrial polyp 8/08   benign  . Family history of osteoporosis 12/28/2011   Mother pt had normal Dexa 11/2009     . Fatigue   . GERD (gastroesophageal reflux disease)   . HIP PAIN, RIGHT 04/12/2009   Qualifier: Diagnosis of  By: Oneida Alar MD, KARL    . History of hiatal hernia   . HYPERLIPIDEMIA 08/25/2008   Qualifier: Diagnosis of  By: Birdie Riddle MD, Belenda Cruise    . IBS (irritable bowel syndrome)   . Left hip pain 07/30/2013  . Left wrist pain 07/30/2013  . Lumbar strain, initial encounter 07/28/2019  . LUNG NODULE 11/19/2009   Neg CT    . 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 Menopause   . Migraine   . NEUTROPENIA UNSPECIFIED 10/07/2008   Qualifier: Diagnosis of  By: Birdie Riddle MD, Belenda Cruise    . NONSPCIFC ABN FINDING RAD & OTH EXAM LUNG FIELD 11/19/2009   Qualifier: Diagnosis of  By: Alveta Heimlich MD, Cornelia Copa    . Nonspecific (abnormal) findings on radiological and other examination of body structure 11/19/2009   Qualifier: Diagnosis of By: Alveta Heimlich MD, Bennie Dallas list entry automatically replaced. Please review for accuracy.  . Otalgia, unspecified ear   . Pain in throat   . Palpitations   . Peroneal tendinitis of right lower extremity 03/12/2014  . Plantar wart 04/02/2014  . Pneumonia   . RHINITIS 06/21/2009   Qualifier: Diagnosis of  By: Birdie Riddle MD, Belenda Cruise    . Right ankle injury, subsequent encounter 01/11/2018  . Right foot pain 08/09/2011  . Sinusitis, chronic   . Skene's gland abscess  2009   patient unaware  . Sprain of unspecified ligament of right ankle, subsequent encounter   . TRANSAMINASES, SERUM, ELEVATED 12/07/2009   Qualifier: Diagnosis of  By: Inda Castle FNP, Wellington Hampshire   . Unspecified cataract   . Urinary tract infection, site not specified   . Vitamin D deficiency    Past Surgical History:  Procedure Laterality Date  . COLONOSCOPY    . DILATION AND CURETTAGE OF UTERUS  8/08  . GYNECOLOGIC CRYOSURGERY  1980  . HYSTEROSCOPY WITH D & C N/A 03/30/2017   Procedure: DILATATION & CURETTAGE/HYSTEROSCOPY WITH ULTRASOUND GUIDANCE;  Surgeon: Megan Salon, MD;  Location: [facility];  Service: Gynecology;  Laterality: N/A;  . UPPER GI ENDOSCOPY    . uterine ablation  2007    Current Outpatient Medications  Medication Sig Dispense Refill  . apixaban (ELIQUIS) 5 MG TABS tablet Take 1 tablet (5 mg total) by mouth 2 (two) times daily. 60 tablet 6  . azelastine (ASTELIN) 0.1 % nasal spray daily as needed.     . Boswellia-Glucosamine-Vit D (OSTEO BI-FLEX ONE PER DAY PO) Take 1 tablet by mouth daily.    . Cholecalciferol (VITAMIN D) 50 MCG (2000 UT) tablet Take 2,000 Units by mouth daily.    . citalopram (CELEXA) 20 MG tablet TAKE 1 TABLET BY MOUTH ONCE DAILY 90 tablet 3  . diclofenac sodium (VOLTAREN) 1 % GEL APPLY 2 TO 4 GRAMS TOPICALLY TO AFFECTED AREA 4 TIMES PER DAY WHEN NEEDED  3  . famotidine (PEPCID) 20 MG tablet Take 20 mg by mouth as needed for heartburn or indigestion.    . metoprolol succinate (TOPROL-XL) 25 MG 24 hr tablet Take 1 tablet (25 mg total) by mouth daily. 90 tablet 3  . montelukast (SINGULAIR) 10 MG tablet Take 10 mg by mouth as needed.     . Multiple Vitamins-Minerals (MULTI FOR HER 50+) TABS Take 1 tablet by mouth daily.      . Omega-3 Fatty Acids (FISH OIL PO) Take 1 capsule by mouth daily.    . Probiotic Product (PROBIOTIC PO) Take 1 capsule by mouth daily. Called ultra spectrum    . vitamin E 400 UNIT capsule Take 400 Units by mouth daily.    . Zinc 50 MG TABS     . diltiazem (CARDIZEM) 30 MG tablet Take 1 Tablet Every 4 Hours As Needed For HR >100 45 tablet 3   No current facility-administered medications for this encounter.    No Known Allergies  Social History   Socioeconomic History  . Marital status: Single    Spouse name: Not on file  . Number of children: 0  . Years of education: Not on file  . Highest education level: Not on file  Occupational History  . Occupation: Programmer, multimedia: [employer]  Tobacco Use  . Smoking status: Former Smoker    Packs/day: 0.10    Years: 3.00    Pack years: 0.30    Types: Cigarettes     Quit date: 10/02/1978    Years since quitting: 41.0  . Smokeless tobacco: Never Used  Substance and Sexual Activity  . Alcohol use: Yes    Alcohol/week: 7.0 standard drinks    Types: 7 Glasses of wine per week    Comment: per week  . Drug use: No  . Sexual activity: Not Currently    Birth control/protection: Post-menopausal  Other Topics Concern  . Not on file  Social History Narrative  . Not  on file   Social Determinants of Health   Financial Resource Strain:   . Difficulty of Paying Living Expenses: Not on file  Food Insecurity:   . Worried About Charity fundraiser in the Last Year: Not on file  . Ran Out of Food in the Last Year: Not on file  Transportation Needs:   . Lack of Transportation (Medical): Not on file  . Lack of Transportation (Non-Medical): Not on file  Physical Activity:   . Days of Exercise per Week: Not on file  . Minutes of Exercise per Session: Not on file  Stress:   . Feeling of Stress : Not on file  Social Connections:   . Frequency of Communication with Friends and Family: Not on file  . Frequency of Social Gatherings with Friends and Family: Not on file  . Attends Religious Services: Not on file  . Active Member of Clubs or Organizations: Not on file  . Attends Archivist Meetings: Not on file  . Marital Status: Not on file  Intimate Partner Violence:   . Fear of Current or Ex-Partner: Not on file  . Emotionally Abused: Not on file  . Physically Abused: Not on file  . Sexually Abused: Not on file    Family History  Problem Relation Age of Onset  . Colon polyps Father   . Ulcerative colitis Father   . CAD Father        Stent placement at age 73  . Glaucoma Father   . Hypertension Father   . Colitis Father   . Breast cancer Paternal Grandmother   . Colon cancer Maternal Grandfather   . Colon polyps Mother   . Osteoporosis Mother   . Thyroid disease Mother        hypo  . Glaucoma Mother   . Diverticulosis Mother   . Sudden  death Neg Hx   . Hyperlipidemia Neg Hx   . Heart attack Neg Hx   . Diabetes Neg Hx     ROS- All systems are reviewed and negative except as per the HPI above  Physical Exam: Vitals:   10/27/19 1102  BP: 126/82  Pulse: (!) 56  Weight: 68.2 kg  Height: 5\' 5"  (1.651 m)   Wt Readings from Last 3 Encounters:  10/27/19 68.2 kg  10/09/19 68.6 kg  10/06/19 65.8 kg    Labs: Lab Results  Component Value Date   NA 135 03/26/2017   K 3.9 03/26/2017   CL 101 03/26/2017   CO2 28 03/26/2017   GLUCOSE 119 (H) 03/26/2017   BUN 18 03/26/2017   CREATININE 0.78 03/26/2017   CALCIUM 9.5 03/26/2017   No results found for: INR Lab Results  Component Value Date   CHOL 205 (H) 03/16/2014   HDL 103 03/16/2014   LDLCALC 80 03/16/2014   TRIG 108 03/16/2014     GEN- The patient is well appearing, alert and oriented x 3 today.   Head- normocephalic, atraumatic Eyes-  Sclera clear, conjunctiva pink Ears- hearing intact Oropharynx- clear Neck- supple, no JVP Lymph- no cervical lymphadenopathy Lungs- Clear to ausculation bilaterally, normal work of breathing Heart- Regular rate and rhythm, no murmurs, rubs or gallops, PMI not laterally displaced GI- soft, NT, ND, + BS Extremities- no clubbing, cyanosis, or edema MS- no significant deformity or atrophy Skin- no rash or lesion Psych- euthymic mood, full affect Neuro- strength and sensation are intact  EKG- Sinus brady at 56 bpm, pr i9nt 132 ms, qtc 426  ms Echo-1. Left ventricular ejection fraction, by visual estimation, is 60 to 65%. The left ventricle has normal function. Left ventricular septal wall thickness was mildly increased. Mildly increased left ventricular posterior wall thickness. There is mildly increased left ventricular hypertrophy. 2. Left ventricular diastolic parameters are consistent with Grade I diastolic dysfunction (impaired relaxation). 3. Global right ventricle has normal systolic function.The right ventricular  size is normal. No increase in right ventricular wall thickness. 4. Left atrial size was moderately dilated. 5. Right atrial size was normal. 6. The mitral valve is normal in structure. No evidence of mitral valve regurgitation. No evidence of mitral stenosis. 7. The tricuspid valve is normal in structure. Tricuspid valve regurgitation is trivial. 8. The aortic valve is tricuspid. Aortic valve regurgitation is not visualized. No evidence of aortic valve sclerosis or stenosis. 9. The pulmonic valve was normal in structure. Pulmonic valve regurgitation is trivial. 10. Normal pulmonary artery systolic pressure. 11. The inferior vena cava is normal in size with greater than 50% respiratory variability, suggesting right atrial pressure of 3 mmHg. Study Highlights   Monitor Sinus rhythm with sinus bradycardia Atrial fibrillation/flutter noted. Longest duration 2 hours, 25 minutes.  Several short bursts of supraventricular tachycardia One 7 beat run of ventricular tachycardia.      Assessment and Plan: 1. New onset paroxysmal afib  General education re afib  Continue  metoprolol ER 25 mg daily No unusual risk factors that need addressing at this time If afib burden increases consider reducing wine from nightly  to no more than 2 glasses a week  2. CHA2DS2VASc score of 1 ( will  be two in July at age 63) By guidelines,she  does not need to be on anticoagulation until a CHA2DS2VASc of 3  For now will continue daily anticoagulation, eliquis 5 mg bid   and pt is in agreement  Bleeding precautions  Reviewed  Return in one month with a cbc, bmet for f/u start of  anticoagulation  Butch Penny C. Farhan Jean, [facility] [address] [place], [place] [address] [phone]

## 2019-10-28 ENCOUNTER — Telehealth (HOSPITAL_COMMUNITY): Payer: Self-pay

## 2019-10-28 NOTE — Telephone Encounter (Signed)
Patient called and states she previously started on Eliquis a few weeks ago. She punctured her finger in two places while chopping vegetables last night. The two places on her finger bled for over 30 minutes and some this morning. She was wondering what she could do next time with the bleeding. If she has a severe cut and she is not able to stop the bleeding she should go to the ER and they would be able to manage her bleeding. Per Roderic Palau- NP  advised patient to purchase some Coban to have on hand to help apply tighter pressure to the wound to stop the bleeding if this happens again. Patient verbalized understanding.

## 2019-10-29 ENCOUNTER — Other Ambulatory Visit: Payer: 59

## 2019-10-29 ENCOUNTER — Encounter (HOSPITAL_COMMUNITY): Payer: Self-pay | Admitting: Nurse Practitioner

## 2019-10-29 DIAGNOSIS — Z6824 Body mass index (BMI) 24.0-24.9, adult: Secondary | ICD-10-CM | POA: Diagnosis not present

## 2019-10-29 DIAGNOSIS — Z1211 Encounter for screening for malignant neoplasm of colon: Secondary | ICD-10-CM | POA: Diagnosis not present

## 2019-10-29 DIAGNOSIS — Z113 Encounter for screening for infections with a predominantly sexual mode of transmission: Secondary | ICD-10-CM | POA: Diagnosis not present

## 2019-10-29 DIAGNOSIS — Z01419 Encounter for gynecological examination (general) (routine) without abnormal findings: Secondary | ICD-10-CM | POA: Diagnosis not present

## 2019-10-29 DIAGNOSIS — Z0000 Encounter for general adult medical examination without abnormal findings: Secondary | ICD-10-CM | POA: Diagnosis not present

## 2019-10-29 DIAGNOSIS — Z1151 Encounter for screening for human papillomavirus (HPV): Secondary | ICD-10-CM | POA: Diagnosis not present

## 2019-10-29 DIAGNOSIS — Z118 Encounter for screening for other infectious and parasitic diseases: Secondary | ICD-10-CM | POA: Diagnosis not present

## 2019-10-30 ENCOUNTER — Encounter (HOSPITAL_COMMUNITY): Payer: Self-pay

## 2019-11-05 ENCOUNTER — Ambulatory Visit: Payer: 59 | Admitting: Family Medicine

## 2019-11-05 ENCOUNTER — Other Ambulatory Visit: Payer: Self-pay

## 2019-11-05 ENCOUNTER — Encounter: Payer: Self-pay | Admitting: Family Medicine

## 2019-11-05 VITALS — BP 112/74

## 2019-11-05 DIAGNOSIS — R0781 Pleurodynia: Secondary | ICD-10-CM | POA: Diagnosis not present

## 2019-11-05 MED ORDER — NORTRIPTYLINE HCL 25 MG PO CAPS: 25.0000 mg | ORAL_CAPSULE | Freq: Every day | ORAL | 2 refills | Status: DC

## 2019-11-05 MED FILL — NORTRIPTYLINE HCL 25 MG CAP: 25 | 30 days supply | Qty: 30 | Fill #0

## 2019-11-05 NOTE — Patient Instructions (Signed)
Start nortriptyline 25mg  at bedtime - let me know in a week or two how you're doing - we have room to go up on this if needed. Start physical therapy for oblique strain as well and do home exercises on days you don't go to therapy. Follow up with me in 5-6 weeks for reevaluation otherwise.

## 2019-11-05 NOTE — Progress Notes (Signed)
PCP: Fanny Bien, MD  Subjective:   HPI: Patient is a 64 y.o. female here for left side pain.  1/4: Patient reports for about 10 days she's had pain on left side of lower ribs and abdomen. No known injury or trauma. She's active and does pilates but no issues with this. She was evaluated by GI and has a screening colonoscopy coming up Friday but felt to be due to a non-GI cause. Radiation into the upper back. No dysuria, hematuria, fevers, chills, sweats, nausea, vomiting, diarrhea, melena, hematochezia. No rash. Standing eases the pain some. No pain with a deep breath. No night pain.  2/3: Patient reports pain feels about the same. At times has no pain and this is intermittent but has a dull pain lateral and inferior left ribcage area into abdomen. Will have a burning and sharp quality to this as well. Has been taking pepcid and a PPI, topical voltaren gel without relief. Dr. Collene Mares with GI reported pain due to a non-GI cause. She's not had a rash, swelling, bruising of the area. No bowel/bladder dysfunction, nausea, vomiting, diarrhea, dysuria, hematuria, hematochezia.  Past Medical History:  Diagnosis Date  . Abdominal pain, left upper quadrant   . Acute bronchospasm   . Acute neck pain 07/02/2019  . Acute sinusitis, unspecified 05/20/2009   Centricity Description: SINUSITIS - ACUTE-NOS Qualifier: Diagnosis of  By: Birdie Riddle MD, Belenda Cruise   Centricity Description: ACUTE SINUSITIS, UNSPECIFIED Qualifier: Diagnosis of  By: Alveta Heimlich MD, Cornelia Copa   Centricity Description: SINUSITIS, ACUTE Qualifier: Diagnosis of  By: Koleen Nimrod MD, Dellis Filbert    . Atypical chest pain 03/05/2013  . Body mass index (BMI) of 23.0-23.9 in adult   . Cervicalgia   . Colon polyp   . Costochondral junction syndrome   . Cough   . DEPRESSION 08/25/2008   Qualifier: Diagnosis of  By: Ronnald Ramp CMA, Chemira    . Diarrhea   . Diverticulosis 07/27/2012  . Dorsalgia   . Elevated blood pressure reading without diagnosis  of hypertension   . Endometrial polyp 8/08   benign  . Family history of osteoporosis 12/28/2011   Mother pt had normal Dexa 11/2009   . Fatigue   . GERD (gastroesophageal reflux disease)   . HIP PAIN, RIGHT 04/12/2009   Qualifier: Diagnosis of  By: Oneida Alar MD, KARL    . History of hiatal hernia   . HYPERLIPIDEMIA 08/25/2008   Qualifier: Diagnosis of  By: Birdie Riddle MD, Belenda Cruise    . IBS (irritable bowel syndrome)   . Left hip pain 07/30/2013  . Left wrist pain 07/30/2013  . Lumbar strain, initial encounter 07/28/2019  . LUNG NODULE 11/19/2009   Neg CT    . 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 Menopause   . Migraine   . NEUTROPENIA UNSPECIFIED 10/07/2008   Qualifier: Diagnosis of  By: Birdie Riddle MD, Belenda Cruise    . NONSPCIFC ABN FINDING RAD & OTH EXAM LUNG FIELD 11/19/2009   Qualifier: Diagnosis of  By: Alveta Heimlich MD, Cornelia Copa    . Nonspecific (abnormal) findings on radiological and other examination of body structure 11/19/2009   Qualifier: Diagnosis of By: Alveta Heimlich MD, Bennie Dallas list entry automatically replaced. Please review for accuracy.  . Otalgia, unspecified ear   . Pain in throat   . Palpitations   . Peroneal tendinitis of right lower extremity 03/12/2014  . Plantar wart 04/02/2014  . Pneumonia   . RHINITIS 06/21/2009   Qualifier: Diagnosis of  By: Birdie Riddle MD,  Belenda Cruise    . Right ankle injury, subsequent encounter 01/11/2018  . Right foot pain 08/09/2011  . Sinusitis, chronic   . Skene's gland abscess 2009   patient unaware  . Sprain of unspecified ligament of right ankle, subsequent encounter   . TRANSAMINASES, SERUM, ELEVATED 12/07/2009   Qualifier: Diagnosis of  By: Inda Castle FNP, Wellington Hampshire   . Unspecified cataract   . Urinary tract infection, site not specified   . Vitamin D deficiency     Current Outpatient Medications on File Prior to Visit  Medication Sig Dispense Refill  . apixaban (ELIQUIS) 5 MG TABS tablet Take 1 tablet (5 mg total) by mouth 2 (two)  times daily. 60 tablet 6  . azelastine (ASTELIN) 0.1 % nasal spray daily as needed.     . Boswellia-Glucosamine-Vit D (OSTEO BI-FLEX ONE PER DAY PO) Take 1 tablet by mouth daily.    . Cholecalciferol (VITAMIN D) 50 MCG (2000 UT) tablet Take 2,000 Units by mouth daily.    . citalopram (CELEXA) 20 MG tablet TAKE 1 TABLET BY MOUTH ONCE DAILY 90 tablet 3  . diclofenac sodium (VOLTAREN) 1 % GEL APPLY 2 TO 4 GRAMS TOPICALLY TO AFFECTED AREA 4 TIMES PER DAY WHEN NEEDED  3  . diltiazem (CARDIZEM) 30 MG tablet Take 1 Tablet Every 4 Hours As Needed For HR >100 45 tablet 3  . famotidine (PEPCID) 20 MG tablet Take 20 mg by mouth as needed for heartburn or indigestion.    . metoprolol succinate (TOPROL-XL) 25 MG 24 hr tablet Take 1 tablet (25 mg total) by mouth daily. 90 tablet 3  . montelukast (SINGULAIR) 10 MG tablet Take 10 mg by mouth as needed.     . Multiple Vitamins-Minerals (MULTI FOR HER 50+) TABS Take 1 tablet by mouth daily.      . Omega-3 Fatty Acids (FISH OIL PO) Take 1 capsule by mouth daily.    . Probiotic Product (PROBIOTIC PO) Take 1 capsule by mouth daily. Called ultra spectrum    . vitamin E 400 UNIT capsule Take 400 Units by mouth daily.    . Zinc 50 MG TABS      No current facility-administered medications on file prior to visit.    Past Surgical History:  Procedure Laterality Date  . COLONOSCOPY    . DILATION AND CURETTAGE OF UTERUS  8/08  . GYNECOLOGIC CRYOSURGERY  1980  . HYSTEROSCOPY WITH D & C N/A 03/30/2017   Procedure: DILATATION & CURETTAGE/HYSTEROSCOPY WITH ULTRASOUND GUIDANCE;  Surgeon: Megan Salon, MD;  Location: [facility];  Service: Gynecology;  Laterality: N/A;  . UPPER GI ENDOSCOPY    . uterine ablation  2007    No Known Allergies  Social History   Socioeconomic History  . Marital status: Single    Spouse name: Not on file  . Number of children: 0  . Years of education: Not on file  . Highest education level: Not on file  Occupational History  .  Occupation: Programmer, multimedia: [employer]  Tobacco Use  . Smoking status: Former Smoker    Packs/day: 0.10    Years: 3.00    Pack years: 0.30    Types: Cigarettes    Quit date: 10/02/1978    Years since quitting: 41.1  . Smokeless tobacco: Never Used  Substance and Sexual Activity  . Alcohol use: Yes    Alcohol/week: 7.0 standard drinks    Types: 7 Glasses of wine per week    Comment:  per week  . Drug use: No  . Sexual activity: Not Currently    Birth control/protection: Post-menopausal  Other Topics Concern  . Not on file  Social History Narrative  . Not on file   Social Determinants of Health   Financial Resource Strain:   . Difficulty of Paying Living Expenses: Not on file  Food Insecurity:   . Worried About Charity fundraiser in the Last Year: Not on file  . Ran Out of Food in the Last Year: Not on file  Transportation Needs:   . Lack of Transportation (Medical): Not on file  . Lack of Transportation (Non-Medical): Not on file  Physical Activity:   . Days of Exercise per Week: Not on file  . Minutes of Exercise per Session: Not on file  Stress:   . Feeling of Stress : Not on file  Social Connections:   . Frequency of Communication with Friends and Family: Not on file  . Frequency of Social Gatherings with Friends and Family: Not on file  . Attends Religious Services: Not on file  . Active Member of Clubs or Organizations: Not on file  . Attends Archivist Meetings: Not on file  . Marital Status: Not on file  Intimate Partner Violence:   . Fear of Current or Ex-Partner: Not on file  . Emotionally Abused: Not on file  . Physically Abused: Not on file  . Sexually Abused: Not on file    Family History  Problem Relation Age of Onset  . Colon polyps Father   . Ulcerative colitis Father   . CAD Father        Stent placement at age 79  . Glaucoma Father   . Hypertension Father   . Colitis Father   . Breast cancer Paternal Grandmother   . Colon cancer  Maternal Grandfather   . Colon polyps Mother   . Osteoporosis Mother   . Thyroid disease Mother        hypo  . Glaucoma Mother   . Diverticulosis Mother   . Sudden death Neg Hx   . Hyperlipidemia Neg Hx   . Heart attack Neg Hx   . Diabetes Neg Hx     BP 112/74   LMP 10/02/2006 (Approximate)   Review of Systems: See HPI above.     Objective:  Physical Exam:  Gen: NAD, comfortable in exam room  Chest: Lungs CTAB without wheezes, rales, rhonchi. CV: RRR no MRG MSK: No palpable stepoffs of ribs.  No swelling, crepitus, warmth.  No rashes visible over area of pain. Mild tenderness over ribs 8-10 laterally to anterior including oblique abdominal musculature. Mild pain with trunk rotation.  No pain with crunch, leg lift. Abd: soft, nt, nd, no HSM.  Normal bowel sounds.   Assessment & Plan:  1. Left thoracic/abdominal pain - Area of pain has diminished since last visit though symptoms have not changed much.  CT of chest was reassuring as well without evidence rib fracture or bony lesion.  Will start PT for oblique strain.  Start nortriptyline 25mg  qhs as well to help with symptoms.  Can continue with tylenol, topical medications, ice or heat if needed.  F/u in 5-6 weeks but let us know how she's doing in 1-2 weeks.

## 2019-11-11 ENCOUNTER — Other Ambulatory Visit: Payer: Self-pay

## 2019-11-11 ENCOUNTER — Encounter: Payer: Self-pay | Admitting: Physical Therapy

## 2019-11-11 ENCOUNTER — Ambulatory Visit: Payer: 59 | Attending: Family Medicine | Admitting: Physical Therapy

## 2019-11-11 ENCOUNTER — Ambulatory Visit: Payer: 59 | Admitting: Physical Therapy

## 2019-11-11 DIAGNOSIS — M62838 Other muscle spasm: Secondary | ICD-10-CM | POA: Insufficient documentation

## 2019-11-11 DIAGNOSIS — M542 Cervicalgia: Secondary | ICD-10-CM | POA: Diagnosis not present

## 2019-11-11 DIAGNOSIS — M62830 Muscle spasm of back: Secondary | ICD-10-CM | POA: Insufficient documentation

## 2019-11-11 NOTE — Therapy (Signed)
[place], [place], [address] Phone: [phone]   Fax:  [phone]  Physical Therapy Evaluation  Patient Details  Name: Kara Warren MRN: [id number] Date of Birth: [birth year] Referring Provider (PT): Karlton Lemon, MD   Encounter Date: 11/11/2019  PT End of Session - 11/11/19 1341    Visit Number  1    Number of Visits  9    Date for PT Re-Evaluation  12/12/19    Authorization Type  [facility] [employer]    PT Start Time  1330    PT Stop Time  1414    PT Time Calculation (min)  44 min    Activity Tolerance  Patient tolerated treatment well    Behavior During Therapy  [facility] for tasks assessed/performed       Past Medical History:  Diagnosis Date  . Abdominal pain, left upper quadrant   . Acute bronchospasm   . Acute neck pain 07/02/2019  . Acute sinusitis, unspecified 05/20/2009   Centricity Description: SINUSITIS - ACUTE-NOS Qualifier: Diagnosis of  By: Birdie Riddle MD, Belenda Cruise   Centricity Description: ACUTE SINUSITIS, UNSPECIFIED Qualifier: Diagnosis of  By: Alveta Heimlich MD, Cornelia Copa   Centricity Description: SINUSITIS, ACUTE Qualifier: Diagnosis of  By: Koleen Nimrod MD, Dellis Filbert    . Atypical chest pain 03/05/2013  . Body mass index (BMI) of 23.0-23.9 in adult   . Cervicalgia   . Colon polyp   . Costochondral junction syndrome   . Cough   . DEPRESSION 08/25/2008   Qualifier: Diagnosis of  By: Ronnald Ramp CMA, Chemira    . Diarrhea   . Diverticulosis 07/27/2012  . Dorsalgia   . Elevated blood pressure reading without diagnosis of hypertension   . Endometrial polyp 8/08   benign  . Family history of osteoporosis 12/28/2011   Mother pt had normal Dexa 11/2009   . Fatigue   . GERD (gastroesophageal reflux disease)   . HIP PAIN, RIGHT 04/12/2009   Qualifier: Diagnosis of  By: Oneida Alar MD, KARL    . History of hiatal hernia   . HYPERLIPIDEMIA 08/25/2008   Qualifier: Diagnosis of  By: Birdie Riddle MD, Belenda Cruise    . IBS (irritable bowel  syndrome)   . Left hip pain 07/30/2013  . Left wrist pain 07/30/2013  . Lumbar strain, initial encounter 07/28/2019  . LUNG NODULE 11/19/2009   Neg CT    . 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 Menopause   . Migraine   . NEUTROPENIA UNSPECIFIED 10/07/2008   Qualifier: Diagnosis of  By: Birdie Riddle MD, Belenda Cruise    . NONSPCIFC ABN FINDING RAD & OTH EXAM LUNG FIELD 11/19/2009   Qualifier: Diagnosis of  By: Alveta Heimlich MD, Cornelia Copa    . Nonspecific (abnormal) findings on radiological and other examination of body structure 11/19/2009   Qualifier: Diagnosis of By: Alveta Heimlich MD, Bennie Dallas list entry automatically replaced. Please review for accuracy.  . Otalgia, unspecified ear   . Pain in throat   . Palpitations   . Peroneal tendinitis of right lower extremity 03/12/2014  . Plantar wart 04/02/2014  . Pneumonia   . RHINITIS 06/21/2009   Qualifier: Diagnosis of  By: Birdie Riddle MD, Belenda Cruise    . Right ankle injury, subsequent encounter 01/11/2018  . Right foot pain 08/09/2011  . Sinusitis, chronic   . Skene's gland abscess 2009   patient unaware  . Sprain of unspecified ligament of right ankle, subsequent encounter   . TRANSAMINASES, SERUM, ELEVATED 12/07/2009  Qualifier: Diagnosis of  By: Inda Castle FNP, Wellington Hampshire   . Unspecified cataract   . Urinary tract infection, site not specified   . Vitamin D deficiency     Past Surgical History:  Procedure Laterality Date  . COLONOSCOPY    . DILATION AND CURETTAGE OF UTERUS  8/08  . GYNECOLOGIC CRYOSURGERY  1980  . HYSTEROSCOPY WITH D & C N/A 03/30/2017   Procedure: DILATATION & CURETTAGE/HYSTEROSCOPY WITH ULTRASOUND GUIDANCE;  Surgeon: Megan Salon, MD;  Location: [facility];  Service: Gynecology;  Laterality: N/A;  . UPPER GI ENDOSCOPY    . uterine ablation  2007    There were no vitals filed for this visit.   Subjective Assessment - 11/11/19 1335    Subjective  Right around Christmas it started and still hurting. No MOI. Active and  does pilates, walks daily. It has not gotten worse. No GI symptoms associated. Xrays negative, CT negative. Pain is only on Lt side. Had some pain in lower back in the beginning.    Currently in Pain?  Yes    Pain Score  4     Pain Location  Rib cage    Pain Orientation  Left    Pain Descriptors / Indicators  Tingling;Stabbing;Burning    Aggravating Factors   sitting, trunk rotation to Rt-- as she sits here now    Pain Relieving Factors  laying supine         [facility] PT Assessment - 11/11/19 0001      Assessment   Medical Diagnosis  Lt rib pain    Referring Provider (PT)  Karlton Lemon, MD    Onset Date/Surgical Date  --   around Christmas   Prior Therapy  cervical, last year      Precautions   Precautions  None      Restrictions   Weight Bearing Restrictions  No      Balance Screen   Has the patient fallen in the past 6 months  No      [place] residence      Prior Function   Level of Independence  Independent    Vocation Requirements  computer      Cognition   Overall Cognitive Status  Within Functional Limits for tasks assessed      Observation/Other Assessments   Focus on Therapeutic Outcomes (FOTO)   43% limited      Sensation   Additional Comments  [facility]      Posture/Postural Control   Posture Comments  forward head. flat thoracic spine      AROM   Overall AROM Comments  WFL, mild pain noted in Rt trunk rotation today      Strength   Overall Strength Comments  WFL      Palpation   Palpation comment  decreased mobility in Rt costovertebral joints vs Lt                Objective measurements completed on examination: See above findings.      [facility] Adult PT Treatment/Exercise - 11/11/19 0001      Manual Therapy   Manual therapy comments  skilled palpation and monitoring during TPDN    Joint Mobilization  Rt costovertebral joint mobs    Soft tissue mobilization  Lt lower ribs intercostals        Trigger Point Dry Needling - 11/11/19 0001    Dry Needling Comments  Lt lower ribs  PT Education - 11/11/19 1938    Education Details  anatomy of condition, POC, HEP, exercise form/rationale, DN &expected outcomes    Person(s) Educated  Patient    Methods  Explanation    Comprehension  Verbalized understanding;Need further instruction          PT Long Term Goals - 11/11/19 1932      PT LONG TERM GOAL #1   Title  Able to consistently perform trunk rotation without sharp pain    Baseline  notices in Lt ribs but is inconsistent    Time  4    Period  Weeks    Status  New    Target Date  12/12/19      PT LONG TERM GOAL #2   Title  Foto to 25% limited    Baseline  43% limited    Time  4    Status  New    Target Date  12/12/19      PT LONG TERM GOAL #3   Title  able to perform all household chores without limitation by rib pain    Baseline  pain noted with mopping, vaccuuming    Time  4    Period  Weeks    Status  New    Target Date  12/12/19      PT LONG TERM GOAL #4   Title  able to perform work activities without limitation by rib pain    Baseline  limited with sudden onset pains    Time  4    Period  Weeks    Status  New    Target Date  12/12/19             Plan - 11/11/19 1345    Clinical Impression Statement  Pt presents to PT with complaints of Lt rib pain that began around Christmas, insidious onset. Has been through multiple tests to rule out GI issues and imaging has been clear. Concordant pain found upon palpation of lateral intercostals at floating ribs. Uilized DN to reduce muscular tension as she was recently in PT and responded well to TPDN. Hard to tell difference right away as pain is not consistent so we will see how she feels at her next appointment. Notable stiffness through right rib cage vs Left. Suspect spasm of obliques/TrA due to hypermobility of floating ribs on Lt and intercostal trigger points.    Personal Factors and  Comorbidities  Comorbidity 1    Comorbidities  h/o cervical pain & LBP    Examination-Activity Limitations  Bend;Sit;Carry;Sleep;Dressing;Stand;Lift;Reach Overhead    Examination-Participation Restrictions  Other    Stability/Clinical Decision Making  Stable/Uncomplicated    Clinical Decision Making  Low    Rehab Potential  Good    PT Frequency  2x / week    PT Duration  4 weeks    PT Treatment/Interventions  ADLs/Self Care Home Management;Cryotherapy;Electrical Stimulation;Moist Heat;Iontophoresis '4mg'$ /ml Dexamethasone;Therapeutic activities;Therapeutic exercise;Neuromuscular re-education;Manual techniques;Patient/family education;Passive range of motion;Dry needling;Joint Manipulations;Spinal Manipulations;Taping    PT Next Visit Plan  outcome of DN? check oblique activation for pain, core stabs for rib stability    PT Home Exercise Plan  upper trapezius and posterior shoulder stretches, corner pec stretch, Theraband rows, extension and supine horizontal abduction, SLR, hip flexor MET, hamstring stretching seated and supine.    Consulted and Agree with Plan of Care  Patient       Patient will benefit from skilled therapeutic intervention in order to improve the following deficits and impairments:  Increased muscle spasms, Pain, Decreased mobility  Visit Diagnosis: Muscle spasm of back - Plan: PT plan of care cert/re-cert     Problem List Patient Active Problem List   Diagnosis Date Noted  . Lumbar strain, initial encounter 07/28/2019  . Acute neck pain 07/02/2019  . Right ankle injury, subsequent encounter 01/11/2018  . Plantar wart 04/02/2014  . Peroneal tendinitis of right lower extremity 03/12/2014  . Left wrist pain 07/30/2013  . Left hip pain 07/30/2013  . Atypical chest pain 03/05/2013  . Diverticulosis 07/27/2012  . Family history of osteoporosis 12/28/2011  . Right foot pain 08/09/2011  . Sinusitis, chronic   . Colon polyp   . Menopause   . Migraine   .  TRANSAMINASES, SERUM, ELEVATED 12/07/2009  . LUNG NODULE 11/19/2009  . Nonspecific (abnormal) findings on radiological and other examination of body structure 11/19/2009  . [place] LUNG FIELD 11/19/2009  . RHINITIS 06/21/2009  . Acute sinusitis, unspecified 05/20/2009  . HIP PAIN, RIGHT 04/12/2009  . NEUTROPENIA UNSPECIFIED 10/07/2008  . HYPERLIPIDEMIA 08/25/2008  . DEPRESSION 08/25/2008   Shriya Aker C. Vernis Eid PT, DPT 11/11/19 7:42 PM   [facility] [address] [place], [place], [address] Phone: [phone]   Fax:  [phone]  Name: EMMAMARIE KLUENDER MRN: [id number] Date of Birth: 1955/11/14

## 2019-11-13 ENCOUNTER — Other Ambulatory Visit: Payer: Self-pay

## 2019-11-13 ENCOUNTER — Ambulatory Visit: Payer: 59 | Admitting: Physical Therapy

## 2019-11-13 ENCOUNTER — Encounter: Payer: Self-pay | Admitting: Physical Therapy

## 2019-11-13 DIAGNOSIS — M542 Cervicalgia: Secondary | ICD-10-CM | POA: Diagnosis not present

## 2019-11-13 DIAGNOSIS — M62830 Muscle spasm of back: Secondary | ICD-10-CM

## 2019-11-13 DIAGNOSIS — M62838 Other muscle spasm: Secondary | ICD-10-CM | POA: Diagnosis not present

## 2019-11-13 NOTE — Therapy (Signed)
[place], [place], [address] Phone: [phone]   Fax:  [phone]  Physical Therapy Treatment  Patient Details  Name: Kara Warren MRN: [id number] Date of Birth: 29-Dec-1955 Referring Provider (PT): Karlton Lemon, MD   Encounter Date: 11/13/2019  PT End of Session - 11/13/19 1059    Visit Number  2    Number of Visits  9    Date for PT Re-Evaluation  12/12/19    Authorization Type  [facility]    PT Start Time  1015    PT Stop Time  1055    PT Time Calculation (min)  40 min    Activity Tolerance  Patient tolerated treatment well    Behavior During Therapy  [facility] for tasks assessed/performed       Past Medical History:  Diagnosis Date  . Abdominal pain, left upper quadrant   . Acute bronchospasm   . Acute neck pain 07/02/2019  . Acute sinusitis, unspecified 05/20/2009   Centricity Description: SINUSITIS - ACUTE-NOS Qualifier: Diagnosis of  By: Birdie Riddle MD, Belenda Cruise   Centricity Description: ACUTE SINUSITIS, UNSPECIFIED Qualifier: Diagnosis of  By: Alveta Heimlich MD, Cornelia Copa   Centricity Description: SINUSITIS, ACUTE Qualifier: Diagnosis of  By: Koleen Nimrod MD, Dellis Filbert    . Atypical chest pain 03/05/2013  . Body mass index (BMI) of 23.0-23.9 in adult   . Cervicalgia   . Colon polyp   . Costochondral junction syndrome   . Cough   . DEPRESSION 08/25/2008   Qualifier: Diagnosis of  By: Ronnald Ramp CMA, Chemira    . Diarrhea   . Diverticulosis 07/27/2012  . Dorsalgia   . Elevated blood pressure reading without diagnosis of hypertension   . Endometrial polyp 8/08   benign  . Family history of osteoporosis 12/28/2011   Mother pt had normal Dexa 11/2009   . Fatigue   . GERD (gastroesophageal reflux disease)   . HIP PAIN, RIGHT 04/12/2009   Qualifier: Diagnosis of  By: Oneida Alar MD, KARL    . History of hiatal hernia   . HYPERLIPIDEMIA 08/25/2008   Qualifier: Diagnosis of  By: Birdie Riddle MD, Belenda Cruise    . IBS (irritable bowel  syndrome)   . Left hip pain 07/30/2013  . Left wrist pain 07/30/2013  . Lumbar strain, initial encounter 07/28/2019  . LUNG NODULE 11/19/2009   Neg CT    . 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 Menopause   . Migraine   . NEUTROPENIA UNSPECIFIED 10/07/2008   Qualifier: Diagnosis of  By: Birdie Riddle MD, Belenda Cruise    . NONSPCIFC ABN FINDING RAD & OTH EXAM LUNG FIELD 11/19/2009   Qualifier: Diagnosis of  By: Alveta Heimlich MD, Cornelia Copa    . Nonspecific (abnormal) findings on radiological and other examination of body structure 11/19/2009   Qualifier: Diagnosis of By: Alveta Heimlich MD, Bennie Dallas list entry automatically replaced. Please review for accuracy.  . Otalgia, unspecified ear   . Pain in throat   . Palpitations   . Peroneal tendinitis of right lower extremity 03/12/2014  . Plantar wart 04/02/2014  . Pneumonia   . RHINITIS 06/21/2009   Qualifier: Diagnosis of  By: Birdie Riddle MD, Belenda Cruise    . Right ankle injury, subsequent encounter 01/11/2018  . Right foot pain 08/09/2011  . Sinusitis, chronic   . Skene's gland abscess 2009   patient unaware  . Sprain of unspecified ligament of right ankle, subsequent encounter   . TRANSAMINASES, SERUM, ELEVATED 12/07/2009  Qualifier: Diagnosis of  By: Inda Castle FNP, Wellington Hampshire   . Unspecified cataract   . Urinary tract infection, site not specified   . Vitamin D deficiency     Past Surgical History:  Procedure Laterality Date  . COLONOSCOPY    . DILATION AND CURETTAGE OF UTERUS  8/08  . GYNECOLOGIC CRYOSURGERY  1980  . HYSTEROSCOPY WITH D & C N/A 03/30/2017   Procedure: DILATATION & CURETTAGE/HYSTEROSCOPY WITH ULTRASOUND GUIDANCE;  Surgeon: Megan Salon, MD;  Location: [facility];  Service: Gynecology;  Laterality: N/A;  . UPPER GI ENDOSCOPY    . uterine ablation  2007    There were no vitals filed for this visit.  Subjective Assessment - 11/13/19 1016    Subjective  Sore yesterday, not as bad today.                        [facility] Adult PT Treatment/Exercise - 11/13/19 0001      Lumbar Exercises: Stretches   Passive Hamstring Stretch Limitations  supine with strap- midline & lateral    Standing Extension Limitations  seated thoracic extension    Quadruped Mid Back Stretch Limitations  physioball roll out    Other Lumbar Stretch Exercise  cat/camel    Other Lumbar Stretch Exercise  [place] pose- center & sidebend      Lumbar Exercises: Seated   Other Seated Lumbar Exercises  isometric physioball ;press      Lumbar Exercises: Supine   Large Ball Oblique Isometric Limitations  red physioball- Lt arm press recreated similar sensation but not pain      Manual Therapy   Manual therapy comments  skilled palpation and monitoring during TPDN    Joint Mobilization  bil costovertebral joint mobs, thoracic-lumbar AP    Soft tissue mobilization  Lt lower ribs intercostals       Trigger Point Dry Needling - 11/13/19 0001    Dry Needling Comments  Lt lower ribs, Lt lumbar paraspinals                PT Long Term Goals - 11/11/19 1932      PT LONG TERM GOAL #1   Title  Able to consistently perform trunk rotation without sharp pain    Baseline  notices in Lt ribs but is inconsistent    Time  4    Period  Weeks    Status  New    Target Date  12/12/19      PT LONG TERM GOAL #2   Title  Foto to 25% limited    Baseline  43% limited    Time  4    Status  New    Target Date  12/12/19      PT LONG TERM GOAL #3   Title  able to perform all household chores without limitation by rib pain    Baseline  pain noted with mopping, vaccuuming    Time  4    Period  Weeks    Status  New    Target Date  12/12/19      PT LONG TERM GOAL #4   Title  able to perform work activities without limitation by rib pain    Baseline  limited with sudden onset pains    Time  4    Period  Weeks    Status  New    Target Date  12/12/19            Plan -  11/13/19 1102    Clinical  Impression Statement  Twitches evoked with DN to LT lumbar paraspinals and Lt lower rib intercostals. Followed with [facility] as she was more sensitive today. Felt a recreation of similar sensation with iso oblique press using Lt arm but did not recreate pain. HEP to encourage upright posture and extension of thoracic spine.    PT Treatment/Interventions  ADLs/Self Care Home Management;Cryotherapy;Electrical Stimulation;Moist Heat;Iontophoresis '4mg'$ /ml Dexamethasone;Therapeutic activities;Therapeutic exercise;Neuromuscular re-education;Manual techniques;Patient/family education;Passive range of motion;Dry needling;Joint Manipulations;Spinal Manipulations;Taping    PT Next Visit Plan  cont core stability    PT Home Exercise Plan  upper trapezius and posterior shoulder stretches, corner pec stretch, Theraband rows, extension and supine horizontal abduction, SLR, hip flexor MET, hamstring stretching seated and supine.    Consulted and Agree with Plan of Care  Patient       Patient will benefit from skilled therapeutic intervention in order to improve the following deficits and impairments:  Increased muscle spasms, Pain, Decreased mobility  Visit Diagnosis: Muscle spasm of back  Cervicalgia  Other muscle spasm     Problem List Patient Active Problem List   Diagnosis Date Noted  . Lumbar strain, initial encounter 07/28/2019  . Acute neck pain 07/02/2019  . Right ankle injury, subsequent encounter 01/11/2018  . Plantar wart 04/02/2014  . Peroneal tendinitis of right lower extremity 03/12/2014  . Left wrist pain 07/30/2013  . Left hip pain 07/30/2013  . Atypical chest pain 03/05/2013  . Diverticulosis 07/27/2012  . Family history of osteoporosis 12/28/2011  . Right foot pain 08/09/2011  . Sinusitis, chronic   . Colon polyp   . Menopause   . Migraine   . TRANSAMINASES, SERUM, ELEVATED 12/07/2009  . LUNG NODULE 11/19/2009  . Nonspecific (abnormal) findings on radiological and other  examination of body structure 11/19/2009  . [place] LUNG FIELD 11/19/2009  . RHINITIS 06/21/2009  . Acute sinusitis, unspecified 05/20/2009  . HIP PAIN, RIGHT 04/12/2009  . NEUTROPENIA UNSPECIFIED 10/07/2008  . HYPERLIPIDEMIA 08/25/2008  . DEPRESSION 08/25/2008    Quavion Boule C. Ambrosia Wisnewski PT, DPT 11/13/19 11:59 AM   [facility] [address] [place], [place], [address] Phone: [phone]   Fax:  [phone]  Name: Kara Warren MRN: [id number] Date of Birth: 06-06-1956

## 2019-11-19 DIAGNOSIS — M940 Chondrocostal junction syndrome [Tietze]: Secondary | ICD-10-CM | POA: Diagnosis not present

## 2019-11-19 DIAGNOSIS — K219 Gastro-esophageal reflux disease without esophagitis: Secondary | ICD-10-CM | POA: Diagnosis not present

## 2019-11-19 DIAGNOSIS — F411 Generalized anxiety disorder: Secondary | ICD-10-CM | POA: Diagnosis not present

## 2019-11-19 DIAGNOSIS — F331 Major depressive disorder, recurrent, moderate: Secondary | ICD-10-CM | POA: Diagnosis not present

## 2019-11-19 MED FILL — AZELASTINE HCL 137 MCG SPRY: 0.1 | 30 days supply | Qty: 30 | Fill #1

## 2019-11-19 MED FILL — SERTRALINE HCL 100 MG TAB: 100 | 90 days supply | Qty: 90 | Fill #0

## 2019-11-20 ENCOUNTER — Ambulatory Visit: Payer: 59 | Admitting: Physical Therapy

## 2019-11-24 ENCOUNTER — Ambulatory Visit: Payer: 59 | Admitting: Physical Therapy

## 2019-11-24 ENCOUNTER — Other Ambulatory Visit: Payer: Self-pay

## 2019-11-24 ENCOUNTER — Encounter: Payer: Self-pay | Admitting: Physical Therapy

## 2019-11-24 DIAGNOSIS — M542 Cervicalgia: Secondary | ICD-10-CM | POA: Diagnosis not present

## 2019-11-24 DIAGNOSIS — M62830 Muscle spasm of back: Secondary | ICD-10-CM | POA: Diagnosis not present

## 2019-11-24 DIAGNOSIS — M62838 Other muscle spasm: Secondary | ICD-10-CM | POA: Diagnosis not present

## 2019-11-24 NOTE — Progress Notes (Signed)
Office Visit Note  Patient: Kara Warren             Date of Birth: 10-Sep-1956           MRN: [id number]             PCP: Fanny Bien, MD Referring: Juanita Craver, MD Visit Date: 11/27/2019 Occupation: @GUAROCC @  Subjective:  Thoracic pain.   History of Present Illness: Kara Warren is a 64 y.o. female seen in consultation per request of Dr. Collene Mares.  She states about 2 months ago she started feeling some tightness in the left side of her thoracic region and back.  She states she did not require any injury.  She is very active and walks about 1 hour daily.  She also does yoga and Pilates.  She states she went to see Dr. Collene Mares for a visit prior to colonoscopy and mention her symptoms.  The colonoscopy was canceled that she was diagnosed with atrial fibrillation.  Her symptoms persisted and she was seen by Dr. Barbaraann Barthel.  He did x-ray of the rib cage and also CT scan of her chest which was unremarkable.  He diagnosed her with oblique muscle strain and referred her to physical therapy.  She has been going to physical therapy and also has some dry needling.  She has noticed about 50% improvement in her symptoms.  She is also seen by her PCP who felt that gastroesophageal reflux may be contributing to her symptoms.  She is prescribed omeprazole 40 mg a day.  She has been taking omeprazole on a regular basis.  None of the other joints are painful.  There is no family history of autoimmune disease.  Activities of Daily Living:  Patient reports morning stiffness for 0 minute.   Patient Denies nocturnal pain.  Difficulty dressing/grooming: Denies Difficulty climbing stairs: Denies Difficulty getting out of chair: Denies Difficulty using hands for taps, buttons, cutlery, and/or writing: Denies  Review of Systems  Constitutional: Negative for fatigue, night sweats, weight gain and weight loss.  HENT: Negative for mouth sores, trouble swallowing, trouble swallowing, mouth dryness and nose  dryness.   Eyes: Positive for dryness. Negative for pain, redness and visual disturbance.  Respiratory: Negative for cough, shortness of breath and difficulty breathing.   Cardiovascular: Negative for chest pain, palpitations, hypertension, irregular heartbeat and swelling in legs/feet.  Gastrointestinal: Negative for blood in stool, constipation and diarrhea.  Endocrine: Negative for increased urination.  Genitourinary: Negative for vaginal dryness.  Musculoskeletal: Positive for arthralgias and joint pain. Negative for joint swelling, myalgias, muscle weakness, morning stiffness, muscle tenderness and myalgias.  Skin: Negative for color change, rash, hair loss, skin tightness, ulcers and sensitivity to sunlight.  Allergic/Immunologic: Negative for susceptible to infections.  Neurological: Negative for dizziness, memory loss, night sweats and weakness.  Hematological: Negative for swollen glands.  Psychiatric/Behavioral: Negative for depressed mood and sleep disturbance. The patient is nervous/anxious.     PMFS History:  Patient Active Problem List   Diagnosis Date Noted  . Lumbar strain, initial encounter 07/28/2019  . Acute neck pain 07/02/2019  . Right ankle injury, subsequent encounter 01/11/2018  . Plantar wart 04/02/2014  . Peroneal tendinitis of right lower extremity 03/12/2014  . Left wrist pain 07/30/2013  . Left hip pain 07/30/2013  . Atypical chest pain 03/05/2013  . Diverticulosis 07/27/2012  . Family history of osteoporosis 12/28/2011  . Right foot pain 08/09/2011  . Sinusitis, chronic   . Colon polyp   .  Menopause   . Migraine   . TRANSAMINASES, SERUM, ELEVATED 12/07/2009  . LUNG NODULE 11/19/2009  . Nonspecific (abnormal) findings on radiological and other examination of body structure 11/19/2009  . [place] LUNG FIELD 11/19/2009  . RHINITIS 06/21/2009  . Acute sinusitis, unspecified 05/20/2009  . HIP PAIN, RIGHT 04/12/2009  .  NEUTROPENIA UNSPECIFIED 10/07/2008  . HYPERLIPIDEMIA 08/25/2008  . DEPRESSION 08/25/2008    Past Medical History:  Diagnosis Date  . Abdominal pain, left upper quadrant   . Acute bronchospasm   . Acute neck pain 07/02/2019  . Acute sinusitis, unspecified 05/20/2009   Centricity Description: SINUSITIS - ACUTE-NOS Qualifier: Diagnosis of  By: Birdie Riddle MD, Belenda Cruise   Centricity Description: ACUTE SINUSITIS, UNSPECIFIED Qualifier: Diagnosis of  By: Alveta Heimlich MD, Cornelia Copa   Centricity Description: SINUSITIS, ACUTE Qualifier: Diagnosis of  By: Koleen Nimrod MD, Dellis Filbert    . Atypical chest pain 03/05/2013  . Body mass index (BMI) of 23.0-23.9 in adult   . Cervicalgia   . Colon polyp   . Costochondral junction syndrome   . Cough   . DEPRESSION 08/25/2008   Qualifier: Diagnosis of  By: Ronnald Ramp CMA, Chemira    . Diarrhea   . Diverticulosis 07/27/2012  . Dorsalgia   . Elevated blood pressure reading without diagnosis of hypertension   . Endometrial polyp 8/08   benign  . Family history of osteoporosis 12/28/2011   Mother pt had normal Dexa 11/2009   . Fatigue   . GERD (gastroesophageal reflux disease)   . HIP PAIN, RIGHT 04/12/2009   Qualifier: Diagnosis of  By: Oneida Alar MD, KARL    . History of hiatal hernia   . HYPERLIPIDEMIA 08/25/2008   Qualifier: Diagnosis of  By: Birdie Riddle MD, Belenda Cruise    . IBS (irritable bowel syndrome)   . Left hip pain 07/30/2013  . Left wrist pain 07/30/2013  . Lumbar strain, initial encounter 07/28/2019  . LUNG NODULE 11/19/2009   Neg CT    . 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 Menopause   . Migraine   . NEUTROPENIA UNSPECIFIED 10/07/2008   Qualifier: Diagnosis of  By: Birdie Riddle MD, Belenda Cruise    . NONSPCIFC ABN FINDING RAD & OTH EXAM LUNG FIELD 11/19/2009   Qualifier: Diagnosis of  By: Alveta Heimlich MD, Cornelia Copa    . Nonspecific (abnormal) findings on radiological and other examination of body structure 11/19/2009   Qualifier: Diagnosis of By: Alveta Heimlich MD, Bennie Dallas  list entry automatically replaced. Please review for accuracy.  . Otalgia, unspecified ear   . Pain in throat   . Palpitations   . Peroneal tendinitis of right lower extremity 03/12/2014  . Plantar wart 04/02/2014  . Pneumonia   . RHINITIS 06/21/2009   Qualifier: Diagnosis of  By: Birdie Riddle MD, Belenda Cruise    . Right ankle injury, subsequent encounter 01/11/2018  . Right foot pain 08/09/2011  . Sinusitis, chronic   . Skene's gland abscess 2009   patient unaware  . Sprain of unspecified ligament of right ankle, subsequent encounter   . TRANSAMINASES, SERUM, ELEVATED 12/07/2009   Qualifier: Diagnosis of  By: Inda Castle FNP, Wellington Hampshire   . Unspecified cataract   . Urinary tract infection, site not specified   . Vitamin D deficiency     Family History  Problem Relation Age of Onset  . Colon polyps Father   . Ulcerative colitis Father   . CAD Father        Stent placement  at age 36  . Glaucoma Father   . Hypertension Father   . Colitis Father   . Breast cancer Paternal Grandmother   . Colon cancer Maternal Grandfather   . Colon polyps Mother   . Osteoporosis Mother   . Thyroid disease Mother        hypo  . Glaucoma Mother   . Diverticulosis Mother   . Sudden death Neg Hx   . Hyperlipidemia Neg Hx   . Heart attack Neg Hx   . Diabetes Neg Hx    Past Surgical History:  Procedure Laterality Date  . COLONOSCOPY    . DILATION AND CURETTAGE OF UTERUS  8/08  . GYNECOLOGIC CRYOSURGERY  1980  . HYSTEROSCOPY WITH D & C N/A 03/30/2017   Procedure: DILATATION & CURETTAGE/HYSTEROSCOPY WITH ULTRASOUND GUIDANCE;  Surgeon: Megan Salon, MD;  Location: [facility];  Service: Gynecology;  Laterality: N/A;  . UPPER GI ENDOSCOPY    . uterine ablation  2007   Social History   Social History Narrative  . Not on file   Immunization History  Administered Date(s) Administered  . H1N1 09/01/2008  . Influenza Split 12/20/2011  . Influenza Whole 07/02/2008, 07/22/2009  . Influenza,inj,Quad PF,6+ Mos  07/29/2014  . Td 08/05/2007  . Tdap 09/15/2012  . Zoster 12/01/2010     Objective: Vital Signs: BP 113/73 (BP Location: Right Arm, Patient Position: Sitting, Cuff Size: Small)   Pulse (!) 53   Resp 12   Ht 5' 3.75" (1.619 m)   Wt 148 lb 9.6 oz (67.4 kg)   LMP 10/02/2006 (Approximate)   BMI 25.71 kg/m    Physical Exam Vitals and nursing note reviewed.  Constitutional:      Appearance: She is well-developed.  HENT:     Head: Normocephalic and atraumatic.  Eyes:     Conjunctiva/sclera: Conjunctivae normal.  Cardiovascular:     Rate and Rhythm: Normal rate and regular rhythm.     Heart sounds: Normal heart sounds.  Pulmonary:     Effort: Pulmonary effort is normal.     Breath sounds: Normal breath sounds.  Abdominal:     General: Bowel sounds are normal.     Palpations: Abdomen is soft.  Musculoskeletal:     Cervical back: Normal range of motion.  Lymphadenopathy:     Cervical: No cervical adenopathy.  Skin:    General: Skin is warm and dry.     Capillary Refill: Capillary refill takes less than 2 seconds.  Neurological:     Mental Status: She is alert and oriented to person, place, and time.  Psychiatric:        Behavior: Behavior normal.      Musculoskeletal Exam: C-spine, thoracic and lumbar spine were in good range of motion.  She had no SI joint tenderness.  Shoulder joints, elbow joints, wrist joints with good range of motion.  She has mild DIP thickening consistent with osteoarthritis.  Hip joints, knee joints, ankles with good range of motion.  She has bilateral first MTP thickening.  No synovitis was noted.  CDAI Exam: CDAI Score: -- Patient Global: --; Provider Global: -- Swollen: --; Tender: -- Joint Exam 11/27/2019   No joint exam has been documented for this visit   There is currently no information documented on the homunculus. Go to the Rheumatology activity and complete the homunculus joint exam.  Investigation: No additional  findings.  Imaging: XR Thoracic Spine 2 View  Result Date: 11/27/2019 Mild anterior spurring was noted.  No significant disc space narrowing was noted.  Mild dextroscoliosis was noted. Impression: These findings are consistent with mild dextroscoliosis and mild anterior spurring.   Recent Labs: Lab Results  Component Value Date   WBC 5.8 11/25/2019   HGB 13.5 11/25/2019   PLT 311 11/25/2019   NA 137 11/25/2019   K 4.3 11/25/2019   CL 101 11/25/2019   CO2 26 11/25/2019   GLUCOSE 119 (H) 11/25/2019   BUN 14 11/25/2019   CREATININE 0.85 11/25/2019   BILITOT 0.6 03/16/2014   ALKPHOS 49 03/16/2014   AST 34 03/16/2014   ALT 27 03/16/2014   PROT 7.7 03/16/2014   ALBUMIN 5.1 03/16/2014   CALCIUM 9.6 11/25/2019   GFRAA >60 11/25/2019    Speciality Comments: No specialty comments available.  Procedures:  No procedures performed Allergies: Patient has no known allergies.   Assessment / Plan:     Visit Diagnoses: Rib pain on left side - Referred by Dr. Collene Mares, persistent LUQ pain-non GI related.  Patient was evaluated by Dr. Barbaraann Barthel and chest CT-no acute intrathoracic pathology. abd/pel CT:no acute findings.  All x-ray and CT findings were reviewed.  X-ray of the rib cage was unremarkable.  She has been going to physical therapy and getting dry needling with 50% relief.  She had no point tenderness.  I advised continuing with the physical therapy.  I have also given her a handout on some exercises and stretches were demonstrated in the office..  Natural anti-inflammatories were discussed.  Pain in thoracic spine -as she complains of some thoracic discomfort I obtained x-ray of the thoracic spine which showed mild anterior spurring and mild dextroscoliosis.  Plan: XR Thoracic Spine 2 View.  X-ray findings were reviewed.  History of gastroesophageal reflux (GERD)-she is currently on omeprazole by her PCP.  Other medical problems are listed as follows:  Hx of colonic polyps  Peroneal  tendinitis of right lower extremity  History of hyperlipidemia  Hx of migraines  History of depression  Orders: Orders Placed This Encounter  Procedures  . XR Thoracic Spine 2 View   No orders of the defined types were placed in this encounter.    Follow-Up Instructions: Return if symptoms worsen or fail to improve, for Thoracic pain.   Bo Merino, MD  Note - This record has been created using Editor, commissioning.  Chart creation errors have been sought, but may not always  have been located. Such creation errors do not reflect on  the standard of medical care.

## 2019-11-24 NOTE — Therapy (Signed)
[place], [place], [address] Phone: [phone]   Fax:  [phone]  Physical Therapy Treatment  Patient Details  Name: Kara Warren MRN: [id number] Date of Birth: Sep 06, 1956 Referring Provider (PT): Karlton Lemon, MD   Encounter Date: 11/24/2019  PT End of Session - 11/24/19 1015    Visit Number  3    Number of Visits  9    Date for PT Re-Evaluation  12/12/19    Authorization Type  [facility]    PT Start Time  1015    PT Stop Time  1057    PT Time Calculation (min)  42 min    Activity Tolerance  Patient tolerated treatment well    Behavior During Therapy  [facility] for tasks assessed/performed       Past Medical History:  Diagnosis Date  . Abdominal pain, left upper quadrant   . Acute bronchospasm   . Acute neck pain 07/02/2019  . Acute sinusitis, unspecified 05/20/2009   Centricity Description: SINUSITIS - ACUTE-NOS Qualifier: Diagnosis of  By: Birdie Riddle MD, Belenda Cruise   Centricity Description: ACUTE SINUSITIS, UNSPECIFIED Qualifier: Diagnosis of  By: Alveta Heimlich MD, Cornelia Copa   Centricity Description: SINUSITIS, ACUTE Qualifier: Diagnosis of  By: Koleen Nimrod MD, Dellis Filbert    . Atypical chest pain 03/05/2013  . Body mass index (BMI) of 23.0-23.9 in adult   . Cervicalgia   . Colon polyp   . Costochondral junction syndrome   . Cough   . DEPRESSION 08/25/2008   Qualifier: Diagnosis of  By: Ronnald Ramp CMA, Chemira    . Diarrhea   . Diverticulosis 07/27/2012  . Dorsalgia   . Elevated blood pressure reading without diagnosis of hypertension   . Endometrial polyp 8/08   benign  . Family history of osteoporosis 12/28/2011   Mother pt had normal Dexa 11/2009   . Fatigue   . GERD (gastroesophageal reflux disease)   . HIP PAIN, RIGHT 04/12/2009   Qualifier: Diagnosis of  By: Oneida Alar MD, KARL    . History of hiatal hernia   . HYPERLIPIDEMIA 08/25/2008   Qualifier: Diagnosis of  By: Birdie Riddle MD, Belenda Cruise    . IBS (irritable bowel  syndrome)   . Left hip pain 07/30/2013  . Left wrist pain 07/30/2013  . Lumbar strain, initial encounter 07/28/2019  . LUNG NODULE 11/19/2009   Neg CT    . 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 Menopause   . Migraine   . NEUTROPENIA UNSPECIFIED 10/07/2008   Qualifier: Diagnosis of  By: Birdie Riddle MD, Belenda Cruise    . NONSPCIFC ABN FINDING RAD & OTH EXAM LUNG FIELD 11/19/2009   Qualifier: Diagnosis of  By: Alveta Heimlich MD, Cornelia Copa    . Nonspecific (abnormal) findings on radiological and other examination of body structure 11/19/2009   Qualifier: Diagnosis of By: Alveta Heimlich MD, Bennie Dallas list entry automatically replaced. Please review for accuracy.  . Otalgia, unspecified ear   . Pain in throat   . Palpitations   . Peroneal tendinitis of right lower extremity 03/12/2014  . Plantar wart 04/02/2014  . Pneumonia   . RHINITIS 06/21/2009   Qualifier: Diagnosis of  By: Birdie Riddle MD, Belenda Cruise    . Right ankle injury, subsequent encounter 01/11/2018  . Right foot pain 08/09/2011  . Sinusitis, chronic   . Skene's gland abscess 2009   patient unaware  . Sprain of unspecified ligament of right ankle, subsequent encounter   . TRANSAMINASES, SERUM, ELEVATED 12/07/2009  Qualifier: Diagnosis of  By: Inda Castle FNP, Wellington Hampshire   . Unspecified cataract   . Urinary tract infection, site not specified   . Vitamin D deficiency     Past Surgical History:  Procedure Laterality Date  . COLONOSCOPY    . DILATION AND CURETTAGE OF UTERUS  8/08  . GYNECOLOGIC CRYOSURGERY  1980  . HYSTEROSCOPY WITH D & C N/A 03/30/2017   Procedure: DILATATION & CURETTAGE/HYSTEROSCOPY WITH ULTRASOUND GUIDANCE;  Surgeon: Megan Salon, MD;  Location: [facility];  Service: Gynecology;  Laterality: N/A;  . UPPER GI ENDOSCOPY    . uterine ablation  2007    There were no vitals filed for this visit.  Subjective Assessment - 11/24/19 1016    Subjective  It is still there. I feel like it's more sore than stabbing sometimes. I  have been doing my stretches.    Currently in Pain?  No/denies    Aggravating Factors   extension this AM in shower- 5/10                       [facility] Adult PT Treatment/Exercise - 11/24/19 0001      Exercises   Exercises  Other Exercises      Lumbar Exercises: Supine   Other Supine Lumbar Exercises  supine core stabilization progression      Lumbar Exercises: Prone   Other Prone Lumbar Exercises  prone extension with knees bent      Manual Therapy   Manual therapy comments  skilled palpation and monitoring during TPDN    Joint Mobilization  Lt costovertebral iso holds with breathing    Soft tissue mobilization  intercostals       Trigger Point Dry Needling - 11/24/19 0001    Dry Needling Comments  Lt lower ribs intercostals wrapping around                PT Long Term Goals - 11/11/19 1932      PT LONG TERM GOAL #1   Title  Able to consistently perform trunk rotation without sharp pain    Baseline  notices in Lt ribs but is inconsistent    Time  4    Period  Weeks    Status  New    Target Date  12/12/19      PT LONG TERM GOAL #2   Title  Foto to 25% limited    Baseline  43% limited    Time  4    Status  New    Target Date  12/12/19      PT LONG TERM GOAL #3   Title  able to perform all household chores without limitation by rib pain    Baseline  pain noted with mopping, vaccuuming    Time  4    Period  Weeks    Status  New    Target Date  12/12/19      PT LONG TERM GOAL #4   Title  able to perform work activities without limitation by rib pain    Baseline  limited with sudden onset pains    Time  4    Period  Weeks    Status  New    Target Date  12/12/19            Plan - 11/24/19 1156    Clinical Impression Statement  Continued with DN today focusing on intercostals with twitch response elicited. Soreness reported as expected but noted a  reduction in sharp pains. Will continue to benefit from core stability & retraining.     PT Treatment/Interventions  ADLs/Self Care Home Management;Cryotherapy;Electrical Stimulation;Moist Heat;Iontophoresis '4mg'$ /ml Dexamethasone;Therapeutic activities;Therapeutic exercise;Neuromuscular re-education;Manual techniques;Patient/family education;Passive range of motion;Dry needling;Joint Manipulations;Spinal Manipulations;Taping    PT Next Visit Plan  DN PRN- Lt lower rib intercostals & paraspinals, core activation in quadruped    PT Home Exercise Plan  upper trapezius and posterior shoulder stretches, corner pec stretch, Theraband rows, extension and supine horizontal abduction, SLR, hip flexor MET, hamstring stretching seated and supine.    Consulted and Agree with Plan of Care  Patient       Patient will benefit from skilled therapeutic intervention in order to improve the following deficits and impairments:  Increased muscle spasms, Pain, Decreased mobility  Visit Diagnosis: Muscle spasm of back     Problem List Patient Active Problem List   Diagnosis Date Noted  . Lumbar strain, initial encounter 07/28/2019  . Acute neck pain 07/02/2019  . Right ankle injury, subsequent encounter 01/11/2018  . Plantar wart 04/02/2014  . Peroneal tendinitis of right lower extremity 03/12/2014  . Left wrist pain 07/30/2013  . Left hip pain 07/30/2013  . Atypical chest pain 03/05/2013  . Diverticulosis 07/27/2012  . Family history of osteoporosis 12/28/2011  . Right foot pain 08/09/2011  . Sinusitis, chronic   . Colon polyp   . Menopause   . Migraine   . TRANSAMINASES, SERUM, ELEVATED 12/07/2009  . LUNG NODULE 11/19/2009  . Nonspecific (abnormal) findings on radiological and other examination of body structure 11/19/2009  . [place] LUNG FIELD 11/19/2009  . RHINITIS 06/21/2009  . Acute sinusitis, unspecified 05/20/2009  . HIP PAIN, RIGHT 04/12/2009  . NEUTROPENIA UNSPECIFIED 10/07/2008  . HYPERLIPIDEMIA 08/25/2008  . DEPRESSION 08/25/2008     Lateefah Mallery C. Aadi Bordner PT, DPT 11/24/19 12:00 PM   [facility] [address] [place], [place], [address] Phone: [phone]   Fax:  [phone]  Name: JANAYE CORP MRN: [id number] Date of Birth: 09-26-1956

## 2019-11-25 ENCOUNTER — Ambulatory Visit (HOSPITAL_COMMUNITY)
Admission: RE | Admit: 2019-11-25 | Discharge: 2019-11-25 | Disposition: A | Discharge status: Home / Self Care | Payer: 59 | Source: Ambulatory Visit | Attending: Nurse Practitioner | Admitting: Nurse Practitioner

## 2019-11-25 ENCOUNTER — Encounter (HOSPITAL_COMMUNITY): Payer: Self-pay | Admitting: Nurse Practitioner

## 2019-11-25 VITALS — BP 136/82 | HR 52 | Ht 65.0 in | Wt 148.2 lb

## 2019-11-25 DIAGNOSIS — R001 Bradycardia, unspecified: Secondary | ICD-10-CM | POA: Insufficient documentation

## 2019-11-25 DIAGNOSIS — I4892 Unspecified atrial flutter: Secondary | ICD-10-CM | POA: Insufficient documentation

## 2019-11-25 DIAGNOSIS — Z79899 Other long term (current) drug therapy: Secondary | ICD-10-CM | POA: Insufficient documentation

## 2019-11-25 DIAGNOSIS — Z7901 Long term (current) use of anticoagulants: Secondary | ICD-10-CM | POA: Diagnosis not present

## 2019-11-25 DIAGNOSIS — E785 Hyperlipidemia, unspecified: Secondary | ICD-10-CM | POA: Diagnosis not present

## 2019-11-25 DIAGNOSIS — Z8249 Family history of ischemic heart disease and other diseases of the circulatory system: Secondary | ICD-10-CM | POA: Diagnosis not present

## 2019-11-25 DIAGNOSIS — K219 Gastro-esophageal reflux disease without esophagitis: Secondary | ICD-10-CM | POA: Diagnosis not present

## 2019-11-25 DIAGNOSIS — K589 Irritable bowel syndrome without diarrhea: Secondary | ICD-10-CM | POA: Diagnosis not present

## 2019-11-25 DIAGNOSIS — I480 Paroxysmal atrial fibrillation: Secondary | ICD-10-CM | POA: Diagnosis not present

## 2019-11-25 DIAGNOSIS — F329 Major depressive disorder, single episode, unspecified: Secondary | ICD-10-CM | POA: Diagnosis not present

## 2019-11-25 DIAGNOSIS — Z87891 Personal history of nicotine dependence: Secondary | ICD-10-CM | POA: Diagnosis not present

## 2019-11-25 LAB — CBC
HCT: 41.7 % (ref 36.0–46.0)
Hemoglobin: 13.5 g/dL (ref 12.0–15.0)
MCH: 32.1 pg (ref 26.0–34.0)
MCHC: 32.4 g/dL (ref 30.0–36.0)
MCV: 99.3 fL (ref 80.0–100.0)
Platelets: 311 10*3/uL (ref 150–400)
RBC: 4.2 MIL/uL (ref 3.87–5.11)
RDW: 12.3 % (ref 11.5–15.5)
WBC: 5.8 10*3/uL (ref 4.0–10.5)
nRBC: 0 % (ref 0.0–0.2)

## 2019-11-25 LAB — BASIC METABOLIC PANEL
Anion gap: 10 (ref 5–15)
BUN: 14 mg/dL (ref 8–23)
CO2: 26 mmol/L (ref 22–32)
Calcium: 9.6 mg/dL (ref 8.9–10.3)
Chloride: 101 mmol/L (ref 98–111)
Creatinine, Ser: 0.85 mg/dL (ref 0.44–1.00)
GFR calc Af Amer: >60 mL/min (ref >60)
GFR calc non Af Amer: >60 mL/min (ref >60)
Glucose, Bld: 119 mg/dL — ABNORMAL HIGH (ref 70–99)
Potassium: 4.3 mmol/L (ref 3.5–5.1)
Sodium: 137 mmol/L (ref 135–145)

## 2019-11-25 NOTE — Progress Notes (Signed)
Primary Care Physician: Fanny Bien, MD Referring Physician:Dr. Santa Genera is a 64 y.o. female without significant health issues who recently wore a monitor for c/o's palpitations which did show afib/flutter with longest duration 2 hours, 25 mins. Also had several short bursts of SVT.   She saw Dr. Angelena Form on f/u and was started on metoprolol er 25 mg day and also  eliquis 5 mg bid for a CHA2DS2VASc score of 2. Echo showed EF of 60-65%. She  has a CHA2DS2VASc score of 2(female, age 62 in July). EKG today shows SR.   Denies excessive caffeine use, no tobacco, lives alone but does not believe she has significant snoring issues. Drinks a glass of wine nightly.   F/u in afib clinic, 11/25/19. She has noted a random skipped beat but no sustained or very rapid arrhythmia's. No issues voiced today.   Today, she denies symptoms of palpitations, chest pain, shortness of breath, orthopnea, PND, lower extremity edema, dizziness, presyncope, syncope, or neurologic sequela. The patient is tolerating medications without difficulties and is otherwise without complaint today.   Past Medical History:  Diagnosis Date  . Abdominal pain, left upper quadrant   . Acute bronchospasm   . Acute neck pain 07/02/2019  . Acute sinusitis, unspecified 05/20/2009   Centricity Description: SINUSITIS - ACUTE-NOS Qualifier: Diagnosis of  By: Birdie Riddle MD, Belenda Cruise   Centricity Description: ACUTE SINUSITIS, UNSPECIFIED Qualifier: Diagnosis of  By: Alveta Heimlich MD, Cornelia Copa   Centricity Description: SINUSITIS, ACUTE Qualifier: Diagnosis of  By: Koleen Nimrod MD, Dellis Filbert    . Atypical chest pain 03/05/2013  . Body mass index (BMI) of 23.0-23.9 in adult   . Cervicalgia   . Colon polyp   . Costochondral junction syndrome   . Cough   . DEPRESSION 08/25/2008   Qualifier: Diagnosis of  By: Ronnald Ramp CMA, Chemira    . Diarrhea   . Diverticulosis 07/27/2012  . Dorsalgia   . Elevated blood pressure reading without diagnosis  of hypertension   . Endometrial polyp 8/08   benign  . Family history of osteoporosis 12/28/2011   Mother pt had normal Dexa 11/2009   . Fatigue   . GERD (gastroesophageal reflux disease)   . HIP PAIN, RIGHT 04/12/2009   Qualifier: Diagnosis of  By: Oneida Alar MD, KARL    . History of hiatal hernia   . HYPERLIPIDEMIA 08/25/2008   Qualifier: Diagnosis of  By: Birdie Riddle MD, Belenda Cruise    . IBS (irritable bowel syndrome)   . Left hip pain 07/30/2013  . Left wrist pain 07/30/2013  . Lumbar strain, initial encounter 07/28/2019  . LUNG NODULE 11/19/2009   Neg CT    . 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 Menopause   . Migraine   . NEUTROPENIA UNSPECIFIED 10/07/2008   Qualifier: Diagnosis of  By: Birdie Riddle MD, Belenda Cruise    . NONSPCIFC ABN FINDING RAD & OTH EXAM LUNG FIELD 11/19/2009   Qualifier: Diagnosis of  By: Alveta Heimlich MD, Cornelia Copa    . Nonspecific (abnormal) findings on radiological and other examination of body structure 11/19/2009   Qualifier: Diagnosis of By: Alveta Heimlich MD, Bennie Dallas list entry automatically replaced. Please review for accuracy.  . Otalgia, unspecified ear   . Pain in throat   . Palpitations   . Peroneal tendinitis of right lower extremity 03/12/2014  . Plantar wart 04/02/2014  . Pneumonia   . RHINITIS 06/21/2009   Qualifier: Diagnosis of  By: Birdie Riddle MD, Belenda Cruise    .  Right ankle injury, subsequent encounter 01/11/2018  . Right foot pain 08/09/2011  . Sinusitis, chronic   . Skene's gland abscess 2009   patient unaware  . Sprain of unspecified ligament of right ankle, subsequent encounter   . TRANSAMINASES, SERUM, ELEVATED 12/07/2009   Qualifier: Diagnosis of  By: Inda Castle FNP, Wellington Hampshire   . Unspecified cataract   . Urinary tract infection, site not specified   . Vitamin D deficiency    Past Surgical History:  Procedure Laterality Date  . COLONOSCOPY    . DILATION AND CURETTAGE OF UTERUS  8/08  . GYNECOLOGIC CRYOSURGERY  1980  . HYSTEROSCOPY WITH D & C  N/A 03/30/2017   Procedure: DILATATION & CURETTAGE/HYSTEROSCOPY WITH ULTRASOUND GUIDANCE;  Surgeon: Megan Salon, MD;  Location: [facility];  Service: Gynecology;  Laterality: N/A;  . UPPER GI ENDOSCOPY    . uterine ablation  2007    Current Outpatient Medications  Medication Sig Dispense Refill  . apixaban (ELIQUIS) 5 MG TABS tablet Take 1 tablet (5 mg total) by mouth 2 (two) times daily. 60 tablet 6  . azelastine (ASTELIN) 0.1 % nasal spray Place 1 spray into both nostrils as needed.     . [place]-Glucosamine-Vit D (OSTEO BI-FLEX ONE PER DAY PO) Take 1 tablet by mouth daily.    . citalopram (CELEXA) 20 MG tablet TAKE 1 TABLET BY MOUTH ONCE DAILY 90 tablet 3  . diltiazem (CARDIZEM) 30 MG tablet Take 1 Tablet Every 4 Hours As Needed For HR >100 45 tablet 3  . famotidine (PEPCID) 20 MG tablet Take 20 mg by mouth as needed for heartburn or indigestion.    . metoprolol succinate (TOPROL-XL) 25 MG 24 hr tablet Take 1 tablet (25 mg total) by mouth daily. 90 tablet 3  . montelukast (SINGULAIR) 10 MG tablet Take 10 mg by mouth as needed.     . Multiple Vitamins-Minerals (MULTI FOR HER 50+) TABS Take 1 tablet by mouth daily.      . Omega-3 Fatty Acids (FISH OIL PO) Take 1 capsule by mouth daily.    Marland Kitchen omeprazole (PRILOSEC) 40 MG capsule Take by mouth daily.     . Probiotic Product (PROBIOTIC PO) Take 1 capsule by mouth daily. Called ultra spectrum    . vitamin E 400 UNIT capsule Take 400 Units by mouth daily.    . Zinc 50 MG TABS Take by mouth daily.      No current facility-administered medications for this encounter.    No Known Allergies  Social History   Socioeconomic History  . Marital status: Single    Spouse name: Not on file  . Number of children: 0  . Years of education: Not on file  . Highest education level: Not on file  Occupational History  . Occupation: Programmer, multimedia: [employer]  Tobacco Use  . Smoking status: Former Smoker    Packs/day: 0.10    Years: 3.00    Pack  years: 0.30    Types: Cigarettes    Quit date: 10/02/1978    Years since quitting: 41.1  . Smokeless tobacco: Never Used  Substance and Sexual Activity  . Alcohol use: Yes    Alcohol/week: 7.0 standard drinks    Types: 7 Glasses of wine per week    Comment: per week  . Drug use: No  . Sexual activity: Not Currently    Birth control/protection: Post-menopausal  Other Topics Concern  . Not on file  Social History Narrative  .  Not on file   Social Determinants of Health   Financial Resource Strain:   . Difficulty of Paying Living Expenses: Not on file  Food Insecurity:   . Worried About Charity fundraiser in the Last Year: Not on file  . Ran Out of Food in the Last Year: Not on file  Transportation Needs:   . Lack of Transportation (Medical): Not on file  . Lack of Transportation (Non-Medical): Not on file  Physical Activity:   . Days of Exercise per Week: Not on file  . Minutes of Exercise per Session: Not on file  Stress:   . Feeling of Stress : Not on file  Social Connections:   . Frequency of Communication with Friends and Family: Not on file  . Frequency of Social Gatherings with Friends and Family: Not on file  . Attends Religious Services: Not on file  . Active Member of Clubs or Organizations: Not on file  . Attends Archivist Meetings: Not on file  . Marital Status: Not on file  Intimate Partner Violence:   . Fear of Current or Ex-Partner: Not on file  . Emotionally Abused: Not on file  . Physically Abused: Not on file  . Sexually Abused: Not on file    Family History  Problem Relation Age of Onset  . Colon polyps Father   . Ulcerative colitis Father   . CAD Father        Stent placement at age 10  . Glaucoma Father   . Hypertension Father   . Colitis Father   . Breast cancer Paternal Grandmother   . Colon cancer Maternal Grandfather   . Colon polyps Mother   . Osteoporosis Mother   . Thyroid disease Mother        hypo  . Glaucoma Mother     . Diverticulosis Mother   . Sudden death Neg Hx   . Hyperlipidemia Neg Hx   . Heart attack Neg Hx   . Diabetes Neg Hx     ROS- All systems are reviewed and negative except as per the HPI above  Physical Exam: Vitals:   11/25/19 1119  Weight: 67.2 kg  Height: 5\' 5"  (1.651 m)   Wt Readings from Last 3 Encounters:  11/25/19 67.2 kg  10/27/19 68.2 kg  10/09/19 68.6 kg    Labs: Lab Results  Component Value Date   NA 135 03/26/2017   K 3.9 03/26/2017   CL 101 03/26/2017   CO2 28 03/26/2017   GLUCOSE 119 (H) 03/26/2017   BUN 18 03/26/2017   CREATININE 0.78 03/26/2017   CALCIUM 9.5 03/26/2017   No results found for: INR Lab Results  Component Value Date   CHOL 205 (H) 03/16/2014   HDL 103 03/16/2014   LDLCALC 80 03/16/2014   TRIG 108 03/16/2014     GEN- The patient is well appearing, alert and oriented x 3 today.   Head- normocephalic, atraumatic Eyes-  Sclera clear, conjunctiva pink Ears- hearing intact Oropharynx- clear Neck- supple, no JVP Lymph- no cervical lymphadenopathy Lungs- Clear to ausculation bilaterally, normal work of breathing Heart- Regular rate and rhythm, no murmurs, rubs or gallops, PMI not laterally displaced GI- soft, NT, ND, + BS Extremities- no clubbing, cyanosis, or edema MS- no significant deformity or atrophy Skin- no rash or lesion Psych- euthymic mood, full affect Neuro- strength and sensation are intact  EKG- Sinus brady at 56 bpm, pr i9nt 132 ms, qtc 426 ms Echo-1. Left ventricular ejection  fraction, by visual estimation, is 60 to 65%. The left ventricle has normal function. Left ventricular septal wall thickness was mildly increased. Mildly increased left ventricular posterior wall thickness. There is mildly increased left ventricular hypertrophy. 2. Left ventricular diastolic parameters are consistent with Grade I diastolic dysfunction (impaired relaxation). 3. Global right ventricle has normal systolic function.The right  ventricular size is normal. No increase in right ventricular wall thickness. 4. Left atrial size was moderately dilated. 5. Right atrial size was normal. 6. The mitral valve is normal in structure. No evidence of mitral valve regurgitation. No evidence of mitral stenosis. 7. The tricuspid valve is normal in structure. Tricuspid valve regurgitation is trivial. 8. The aortic valve is tricuspid. Aortic valve regurgitation is not visualized. No evidence of aortic valve sclerosis or stenosis. 9. The pulmonic valve was normal in structure. Pulmonic valve regurgitation is trivial. 10. Normal pulmonary artery systolic pressure. 11. The inferior vena cava is normal in size with greater than 50% respiratory variability, suggesting right atrial pressure of 3 mmHg. Study Highlights   Monitor Sinus rhythm with sinus bradycardia Atrial fibrillation/flutter noted. Longest duration 2 hours, 25 minutes.  Several short bursts of supraventricular tachycardia One 7 beat run of ventricular tachycardia.      Assessment and Plan: 1. New onset paroxysmal afib  Currently maintaining SR  General education re afib  Continue  metoprolol ER 25 mg daily No unusual risk factors that need addressing at this time If afib burden increases consider reducing wine from nightly  to no more than 2 glasses a week Also  has 30 mg Cardizem she can use for sustained episodes   2. CHA2DS2VASc score of 1 ( will  be two in July at age 75) By guidelines,she  does not need to be on anticoagulation until a CHA2DS2VASc of 3  She will stop daily Eliquis but keep on hand in case of sustained episode Bmet/cbc today   F/u with Dr. Angelena Form as scheduled in May afib clinic as needed  [place]. Ethell Blatchford, [facility] [address] [place], [place] [address] [phone]

## 2019-11-26 ENCOUNTER — Other Ambulatory Visit: Payer: Self-pay

## 2019-11-26 ENCOUNTER — Encounter: Payer: Self-pay | Admitting: Physical Therapy

## 2019-11-26 ENCOUNTER — Ambulatory Visit: Payer: 59 | Admitting: Physical Therapy

## 2019-11-26 DIAGNOSIS — M62838 Other muscle spasm: Secondary | ICD-10-CM | POA: Diagnosis not present

## 2019-11-26 DIAGNOSIS — M542 Cervicalgia: Secondary | ICD-10-CM | POA: Diagnosis not present

## 2019-11-26 DIAGNOSIS — M62830 Muscle spasm of back: Secondary | ICD-10-CM

## 2019-11-26 NOTE — Therapy (Signed)
[place], [place], [address] Phone: [phone]   Fax:  [phone]  Physical Therapy Treatment  Patient Details  Name: Kara Warren MRN: [id number] Date of Birth: 1956-01-16 Referring Provider (PT): Karlton Lemon, MD   Encounter Date: 11/26/2019  PT End of Session - 11/26/19 1246    Visit Number  4    Number of Visits  9    Date for PT Re-Evaluation  12/12/19    Authorization Type  [facility]    PT Start Time  1059    PT Stop Time  1143    PT Time Calculation (min)  44 min    Activity Tolerance  Patient tolerated treatment well    Behavior During Therapy  [facility] for tasks assessed/performed       Past Medical History:  Diagnosis Date  . Abdominal pain, left upper quadrant   . Acute bronchospasm   . Acute neck pain 07/02/2019  . Acute sinusitis, unspecified 05/20/2009   Centricity Description: SINUSITIS - ACUTE-NOS Qualifier: Diagnosis of  By: Birdie Riddle MD, Belenda Cruise   Centricity Description: ACUTE SINUSITIS, UNSPECIFIED Qualifier: Diagnosis of  By: Alveta Heimlich MD, Cornelia Copa   Centricity Description: SINUSITIS, ACUTE Qualifier: Diagnosis of  By: Koleen Nimrod MD, Dellis Filbert    . Atypical chest pain 03/05/2013  . Body mass index (BMI) of 23.0-23.9 in adult   . Cervicalgia   . Colon polyp   . Costochondral junction syndrome   . Cough   . DEPRESSION 08/25/2008   Qualifier: Diagnosis of  By: Ronnald Ramp CMA, Chemira    . Diarrhea   . Diverticulosis 07/27/2012  . Dorsalgia   . Elevated blood pressure reading without diagnosis of hypertension   . Endometrial polyp 8/08   benign  . Family history of osteoporosis 12/28/2011   Mother pt had normal Dexa 11/2009   . Fatigue   . GERD (gastroesophageal reflux disease)   . HIP PAIN, RIGHT 04/12/2009   Qualifier: Diagnosis of  By: Oneida Alar MD, KARL    . History of hiatal hernia   . HYPERLIPIDEMIA 08/25/2008   Qualifier: Diagnosis of  By: Birdie Riddle MD, Belenda Cruise    . IBS (irritable bowel  syndrome)   . Left hip pain 07/30/2013  . Left wrist pain 07/30/2013  . Lumbar strain, initial encounter 07/28/2019  . LUNG NODULE 11/19/2009   Neg CT    . 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 Menopause   . Migraine   . NEUTROPENIA UNSPECIFIED 10/07/2008   Qualifier: Diagnosis of  By: Birdie Riddle MD, Belenda Cruise    . NONSPCIFC ABN FINDING RAD & OTH EXAM LUNG FIELD 11/19/2009   Qualifier: Diagnosis of  By: Alveta Heimlich MD, Cornelia Copa    . Nonspecific (abnormal) findings on radiological and other examination of body structure 11/19/2009   Qualifier: Diagnosis of By: Alveta Heimlich MD, Bennie Dallas list entry automatically replaced. Please review for accuracy.  . Otalgia, unspecified ear   . Pain in throat   . Palpitations   . Peroneal tendinitis of right lower extremity 03/12/2014  . Plantar wart 04/02/2014  . Pneumonia   . RHINITIS 06/21/2009   Qualifier: Diagnosis of  By: Birdie Riddle MD, Belenda Cruise    . Right ankle injury, subsequent encounter 01/11/2018  . Right foot pain 08/09/2011  . Sinusitis, chronic   . Skene's gland abscess 2009   patient unaware  . Sprain of unspecified ligament of right ankle, subsequent encounter   . TRANSAMINASES, SERUM, ELEVATED 12/07/2009  Qualifier: Diagnosis of  By: Inda Castle FNP, Wellington Hampshire   . Unspecified cataract   . Urinary tract infection, site not specified   . Vitamin D deficiency     Past Surgical History:  Procedure Laterality Date  . COLONOSCOPY    . DILATION AND CURETTAGE OF UTERUS  8/08  . GYNECOLOGIC CRYOSURGERY  1980  . HYSTEROSCOPY WITH D & C N/A 03/30/2017   Procedure: DILATATION & CURETTAGE/HYSTEROSCOPY WITH ULTRASOUND GUIDANCE;  Surgeon: Megan Salon, MD;  Location: [facility];  Service: Gynecology;  Laterality: N/A;  . UPPER GI ENDOSCOPY    . uterine ablation  2007    There were no vitals filed for this visit.  Subjective Assessment - 11/26/19 1131    Subjective  Mild soreness after last tx. but feels like sessions are helping. Still  with intermittent lef costal region pain.    Currently in Pain?  No/denies                       [facility] Adult PT Treatment/Exercise - 11/26/19 0001      Exercises   Exercises  Shoulder      Lumbar Exercises: Stretches   Lower Trunk Rotation Limitations  R LTR with legs on 55 cm P-ball x 15 reps    Other Lumbar Stretch Exercise  standing left lateral trunk/costal region stretch with hands on upper right part of doorway 3x20 sec    Other Lumbar Stretch Exercise  child's pose stretch 20 sec x 3      Lumbar Exercises: Quadruped   Madcat/Old Horse  15 reps    Single Arm Raise  Right;Left;10 reps    Straight Leg Raise  10 reps      Shoulder Exercises: Supine   Protraction  AROM;Strengthening;Both;20 reps    Protraction Weight (lbs)  2    Protraction Limitations  serratus punch      Shoulder Exercises: Standing   Other Standing Exercises  wall push up with a plus 2x10, "Cressy" press with pink foam roll x 15 reps      Manual Therapy   Soft tissue mobilization  STM left intercostal and thoracic paraspinal region       Trigger Point Dry Needling - 11/26/19 0001    Consent Given?  Yes    Dry Needling Comments  left intercostals and serratus in right sidelying with rib lock/ribspace blocking technique as well as left thoracic longissimus in prone at T7-8 region with "shelf" technique, 32 gauge 30 mm needles used           PT Education - 11/26/19 1246    Education Details  needling, costal/periscapular muscular anatomy, exercises    Person(s) Educated  Patient    Methods  Explanation;Demonstration;Tactile cues;Verbal cues    Comprehension  Verbalized understanding;Returned demonstration          PT Long Term Goals - 11/11/19 1932      PT LONG TERM GOAL #1   Title  Able to consistently perform trunk rotation without sharp pain    Baseline  notices in Lt ribs but is inconsistent    Time  4    Period  Weeks    Status  New    Target Date  12/12/19      PT  LONG TERM GOAL #2   Title  Foto to 25% limited    Baseline  43% limited    Time  4    Status  New    Target  Date  12/12/19      PT LONG TERM GOAL #3   Title  able to perform all household chores without limitation by rib pain    Baseline  pain noted with mopping, vaccuuming    Time  4    Period  Weeks    Status  New    Target Date  12/12/19      PT LONG TERM GOAL #4   Title  able to perform work activities without limitation by rib pain    Baseline  limited with sudden onset pains    Time  4    Period  Weeks    Status  New    Target Date  12/12/19            Plan - 11/26/19 1247    Clinical Impression Statement  Progressed exercises with qudruped core stabilization and also for scapular stability with work on serratus strengthening both with good tolerance. Brief discomfort with dry needling but overall session well-tolerated and gradually progressing with therapy goals.    Personal Factors and Comorbidities  Comorbidity 1    Comorbidities  h/o cervical pain & LBP    Examination-Activity Limitations  Bend;Sit;Carry;Sleep;Dressing;Stand;Lift;Reach Overhead    Examination-Participation Restrictions  Other    Stability/Clinical Decision Making  Stable/Uncomplicated    Clinical Decision Making  Low    Rehab Potential  Good    PT Frequency  2x / week    PT Duration  4 weeks    PT Treatment/Interventions  ADLs/Self Care Home Management;Cryotherapy;Electrical Stimulation;Moist Heat;Iontophoresis '4mg'$ /ml Dexamethasone;Therapeutic activities;Therapeutic exercise;Neuromuscular re-education;Manual techniques;Patient/family education;Passive range of motion;Dry needling;Joint Manipulations;Spinal Manipulations;Taping    PT Next Visit Plan  DN PRN- Lt lower rib intercostals & paraspinals, core activation in quadruped, scapular stabilization, stretches and manual prn    PT Home Exercise Plan  upper trapezius and posterior shoulder stretches, corner pec stretch, Theraband rows, extension  and supine horizontal abduction, SLR, hip flexor MET, hamstring stretching seated and supine. LTR iwth P-ball, wall push up with a plus, lateral trunk stretch in doorway    Consulted and Agree with Plan of Care  Patient       Patient will benefit from skilled therapeutic intervention in order to improve the following deficits and impairments:  Increased muscle spasms, Pain, Decreased mobility  Visit Diagnosis: Muscle spasm of back     Problem List Patient Active Problem List   Diagnosis Date Noted  . Lumbar strain, initial encounter 07/28/2019  . Acute neck pain 07/02/2019  . Right ankle injury, subsequent encounter 01/11/2018  . Plantar wart 04/02/2014  . Peroneal tendinitis of right lower extremity 03/12/2014  . Left wrist pain 07/30/2013  . Left hip pain 07/30/2013  . Atypical chest pain 03/05/2013  . Diverticulosis 07/27/2012  . Family history of osteoporosis 12/28/2011  . Right foot pain 08/09/2011  . Sinusitis, chronic   . Colon polyp   . Menopause   . Migraine   . TRANSAMINASES, SERUM, ELEVATED 12/07/2009  . LUNG NODULE 11/19/2009  . Nonspecific (abnormal) findings on radiological and other examination of body structure 11/19/2009  . [place] LUNG FIELD 11/19/2009  . RHINITIS 06/21/2009  . Acute sinusitis, unspecified 05/20/2009  . HIP PAIN, RIGHT 04/12/2009  . NEUTROPENIA UNSPECIFIED 10/07/2008  . HYPERLIPIDEMIA 08/25/2008  . DEPRESSION 08/25/2008    Beaulah Dinning, PT, DPT 11/26/19 12:50 PM  [place] [facility] [facility] [address] [place], [place], [address] Phone: [phone]   Fax:  [phone]  Name: Kara Warren MRN: [id number] Date of Birth: 01-Jul-1956

## 2019-11-27 ENCOUNTER — Ambulatory Visit (INDEPENDENT_AMBULATORY_CARE_PROVIDER_SITE_OTHER): Payer: 59

## 2019-11-27 ENCOUNTER — Encounter: Payer: Self-pay | Admitting: Rheumatology

## 2019-11-27 ENCOUNTER — Ambulatory Visit: Payer: 59 | Admitting: Rheumatology

## 2019-11-27 VITALS — BP 113/73 | HR 53 | Resp 12 | Ht 63.75 in | Wt 148.6 lb

## 2019-11-27 DIAGNOSIS — R0781 Pleurodynia: Secondary | ICD-10-CM

## 2019-11-27 DIAGNOSIS — Z8669 Personal history of other diseases of the nervous system and sense organs: Secondary | ICD-10-CM

## 2019-11-27 DIAGNOSIS — Z8639 Personal history of other endocrine, nutritional and metabolic disease: Secondary | ICD-10-CM

## 2019-11-27 DIAGNOSIS — Z8719 Personal history of other diseases of the digestive system: Secondary | ICD-10-CM | POA: Diagnosis not present

## 2019-11-27 DIAGNOSIS — M546 Pain in thoracic spine: Secondary | ICD-10-CM | POA: Diagnosis not present

## 2019-11-27 DIAGNOSIS — Z8659 Personal history of other mental and behavioral disorders: Secondary | ICD-10-CM

## 2019-11-27 DIAGNOSIS — M7671 Peroneal tendinitis, right leg: Secondary | ICD-10-CM

## 2019-11-27 DIAGNOSIS — Z86010 Personal history of colonic polyps: Secondary | ICD-10-CM

## 2019-12-05 ENCOUNTER — Other Ambulatory Visit: Payer: Self-pay

## 2019-12-05 ENCOUNTER — Ambulatory Visit: Payer: 59 | Attending: Family Medicine | Admitting: Physical Therapy

## 2019-12-05 ENCOUNTER — Encounter: Payer: Self-pay | Admitting: Physical Therapy

## 2019-12-05 DIAGNOSIS — M62830 Muscle spasm of back: Secondary | ICD-10-CM | POA: Diagnosis not present

## 2019-12-05 NOTE — Therapy (Signed)
[place], [place], [address] Phone: [phone]   Fax:  [phone]  Physical Therapy Treatment  Patient Details  Name: Kara Warren MRN: [id number] Date of Birth: [birth year] Referring Provider (PT): Karlton Lemon, MD   Encounter Date: 12/05/2019  PT End of Session - 12/05/19 1022    Visit Number  5    Number of Visits  9    Date for PT Re-Evaluation  12/12/19    Authorization Type  [facility] [employer]    PT Start Time  0932    PT Stop Time  1017    PT Time Calculation (min)  45 min    Activity Tolerance  Patient tolerated treatment well    Behavior During Therapy  [facility] for tasks assessed/performed       Past Medical History:  Diagnosis Date  . Abdominal pain, left upper quadrant   . Acute bronchospasm   . Acute neck pain 07/02/2019  . Acute sinusitis, unspecified 05/20/2009   Centricity Description: SINUSITIS - ACUTE-NOS Qualifier: Diagnosis of  By: Birdie Riddle MD, Belenda Cruise   Centricity Description: ACUTE SINUSITIS, UNSPECIFIED Qualifier: Diagnosis of  By: Alveta Heimlich MD, Cornelia Copa   Centricity Description: SINUSITIS, ACUTE Qualifier: Diagnosis of  By: Koleen Nimrod MD, Dellis Filbert    . Atypical chest pain 03/05/2013  . Body mass index (BMI) of 23.0-23.9 in adult   . Cervicalgia   . Colon polyp   . Costochondral junction syndrome   . Cough   . DEPRESSION 08/25/2008   Qualifier: Diagnosis of  By: Ronnald Ramp CMA, Chemira    . Diarrhea   . Diverticulosis 07/27/2012  . Dorsalgia   . Elevated blood pressure reading without diagnosis of hypertension   . Endometrial polyp 8/08   benign  . Family history of osteoporosis 12/28/2011   Mother pt had normal Dexa 11/2009   . Fatigue   . GERD (gastroesophageal reflux disease)   . HIP PAIN, RIGHT 04/12/2009   Qualifier: Diagnosis of  By: Oneida Alar MD, KARL    . History of hiatal hernia   . HYPERLIPIDEMIA 08/25/2008   Qualifier: Diagnosis of  By: Birdie Riddle MD, Belenda Cruise    . IBS (irritable bowel  syndrome)   . Left hip pain 07/30/2013  . Left wrist pain 07/30/2013  . Lumbar strain, initial encounter 07/28/2019  . LUNG NODULE 11/19/2009   Neg CT    . 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 Menopause   . Migraine   . NEUTROPENIA UNSPECIFIED 10/07/2008   Qualifier: Diagnosis of  By: Birdie Riddle MD, Belenda Cruise    . NONSPCIFC ABN FINDING RAD & OTH EXAM LUNG FIELD 11/19/2009   Qualifier: Diagnosis of  By: Alveta Heimlich MD, Cornelia Copa    . Nonspecific (abnormal) findings on radiological and other examination of body structure 11/19/2009   Qualifier: Diagnosis of By: Alveta Heimlich MD, Bennie Dallas list entry automatically replaced. Please review for accuracy.  . Otalgia, unspecified ear   . Pain in throat   . Palpitations   . Peroneal tendinitis of right lower extremity 03/12/2014  . Plantar wart 04/02/2014  . Pneumonia   . RHINITIS 06/21/2009   Qualifier: Diagnosis of  By: Birdie Riddle MD, Belenda Cruise    . Right ankle injury, subsequent encounter 01/11/2018  . Right foot pain 08/09/2011  . Sinusitis, chronic   . Skene's gland abscess 2009   patient unaware  . Sprain of unspecified ligament of right ankle, subsequent encounter   . TRANSAMINASES, SERUM, ELEVATED 12/07/2009  Qualifier: Diagnosis of  By: Inda Castle FNP, Wellington Hampshire   . Unspecified cataract   . Urinary tract infection, site not specified   . Vitamin D deficiency     Past Surgical History:  Procedure Laterality Date  . COLONOSCOPY    . DILATION AND CURETTAGE OF UTERUS  8/08  . GYNECOLOGIC CRYOSURGERY  1980  . HYSTEROSCOPY WITH D & C N/A 03/30/2017   Procedure: DILATATION & CURETTAGE/HYSTEROSCOPY WITH ULTRASOUND GUIDANCE;  Surgeon: Megan Salon, MD;  Location: [facility];  Service: Gynecology;  Laterality: N/A;  . UPPER GI ENDOSCOPY    . uterine ablation  2007    There were no vitals filed for this visit.  Subjective Assessment - 12/05/19 0934    Subjective  Continues with rib pain but noting mild improvement, dry needling helps.  Also got massage recently which helped.    Currently in Pain?  Yes    Pain Score  3     Pain Location  Rib cage    Pain Orientation  Left    Pain Descriptors / Indicators  Tingling;Stabbing;Burning    Pain Type  Chronic pain    Pain Onset  More than a month ago    Pain Frequency  Intermittent    Aggravating Factors   sometimes no aggs, somtimes symptoms with activity, lifting activities    Pain Relieving Factors  laying supine    Effect of Pain on Daily Activities  limits positional tolerance and ability for IADLs with carrying and lifting activites                       [facility] Adult PT Treatment/Exercise - 12/05/19 0001      Exercises   Exercises  Shoulder      Lumbar Exercises: Stretches   Other Lumbar Stretch Exercise  sidelying manual left QL stretch 3x30 seconds      Shoulder Exercises: Supine   Protraction  AROM;Strengthening;Both;20 reps    Protraction Weight (lbs)  3    Protraction Limitations  serratus punch    Other Supine Exercises  supine Theraband diagonal "chops" x 20 reps with green band, supine Theraband "pullover" with green band 2x10    Other Supine Exercises  open book thoracic stretch x 10 ea. direction bilat.      Shoulder Exercises: Standing   Other Standing Exercises  "Cressy press" scapular protraction with foam roll at wall 2x10, pall-off press with green Theraband x 15 ea. direction      Manual Therapy   Soft tissue mobilization  STM/myofascial release left costal region and abdominal obliques       Trigger Point Dry Needling - 12/05/19 0001    Consent Given?  Yes    Muscles Treated Back/Hip  Quadratus lumborum   also needled left external obliques with 32 gauge 30 mm   Dry Needling Comments  left intercostals and serratus in right sidelying with rib lock/ribspace blocking technique as well as left thoracic longissimus in prone at T7-8 region with "shelf" technique, 32 gauge 30 mm needles used excepting for QL used 30 gauge 75 mm  needle           PT Education - 12/05/19 1021    Education Details  exercises, regional anatomy    Person(s) Educated  Patient    Methods  Explanation;Demonstration;Verbal cues    Comprehension  Returned demonstration;Verbalized understanding          PT Long Term Goals - 11/11/19 1932  PT LONG TERM GOAL #1   Title  Able to consistently perform trunk rotation without sharp pain    Baseline  notices in Lt ribs but is inconsistent    Time  4    Period  Weeks    Status  New    Target Date  12/12/19      PT LONG TERM GOAL #2   Title  Foto to 25% limited    Baseline  43% limited    Time  4    Status  New    Target Date  12/12/19      PT LONG TERM GOAL #3   Title  able to perform all household chores without limitation by rib pain    Baseline  pain noted with mopping, vaccuuming    Time  4    Period  Weeks    Status  New    Target Date  12/12/19      PT LONG TERM GOAL #4   Title  able to perform work activities without limitation by rib pain    Baseline  limited with sudden onset pains    Time  4    Period  Weeks    Status  New    Target Date  12/12/19            Plan - 12/05/19 1022    Clinical Impression Statement  Moderate improvements from baseline status with decreased left costal region pain. Continues previous tx. emphasis with ROM, periscapular and oblique stabilization as well as dry needling and manual therapy all with good tolerance. Progress re: therapy goals ongoing but is making some improvement with activity tolerance.    Personal Factors and Comorbidities  Comorbidity 1    Comorbidities  h/o cervical pain & LBP    Examination-Activity Limitations  Bend;Sit;Carry;Sleep;Dressing;Stand;Lift;Reach Overhead    Stability/Clinical Decision Making  Stable/Uncomplicated    Clinical Decision Making  Low    Rehab Potential  Good    PT Frequency  2x / week    PT Duration  4 weeks    PT Treatment/Interventions  ADLs/Self Care Home  Management;Cryotherapy;Electrical Stimulation;Moist Heat;Iontophoresis '4mg'$ /ml Dexamethasone;Therapeutic activities;Therapeutic exercise;Neuromuscular re-education;Manual techniques;Patient/family education;Passive range of motion;Dry needling;Joint Manipulations;Spinal Manipulations;Taping    PT Next Visit Plan  ERO next week, DN PRN- Lt lower rib intercostals & paraspinals, core activation in quadruped, scapular stabilization, stretches and manual prn    PT Home Exercise Plan  upper trapezius and posterior shoulder stretches, corner pec stretch, Theraband rows, extension and supine horizontal abduction, SLR, hip flexor MET, hamstring stretching seated and supine. LTR iwth P-ball, wall push up with a plus, lateral trunk stretch in doorway    Consulted and Agree with Plan of Care  Patient       Patient will benefit from skilled therapeutic intervention in order to improve the following deficits and impairments:  Increased muscle spasms, Pain, Decreased mobility  Visit Diagnosis: Muscle spasm of back     Problem List Patient Active Problem List   Diagnosis Date Noted  . Lumbar strain, initial encounter 07/28/2019  . Acute neck pain 07/02/2019  . Right ankle injury, subsequent encounter 01/11/2018  . Plantar wart 04/02/2014  . Peroneal tendinitis of right lower extremity 03/12/2014  . Left wrist pain 07/30/2013  . Left hip pain 07/30/2013  . Atypical chest pain 03/05/2013  . Diverticulosis 07/27/2012  . Family history of osteoporosis 12/28/2011  . Right foot pain 08/09/2011  . Sinusitis, chronic   . Colon polyp   .  Menopause   . Migraine   . TRANSAMINASES, SERUM, ELEVATED 12/07/2009  . LUNG NODULE 11/19/2009  . Nonspecific (abnormal) findings on radiological and other examination of body structure 11/19/2009  . [place] LUNG FIELD 11/19/2009  . RHINITIS 06/21/2009  . Acute sinusitis, unspecified 05/20/2009  . HIP PAIN, RIGHT 04/12/2009  . NEUTROPENIA  UNSPECIFIED 10/07/2008  . HYPERLIPIDEMIA 08/25/2008  . DEPRESSION 08/25/2008    Beaulah Dinning, PT, DPT 12/05/19 10:31 AM  [facility] [place] [facility] [address] [place], [place], [address] Phone: [phone]   Fax:  [phone]  Name: Kara Warren MRN: [id number] Date of Birth: [birth year]

## 2019-12-08 ENCOUNTER — Encounter: Payer: Self-pay | Admitting: Physical Therapy

## 2019-12-08 ENCOUNTER — Ambulatory Visit: Payer: 59 | Admitting: Physical Therapy

## 2019-12-08 ENCOUNTER — Other Ambulatory Visit: Payer: Self-pay

## 2019-12-08 DIAGNOSIS — M62830 Muscle spasm of back: Secondary | ICD-10-CM | POA: Diagnosis not present

## 2019-12-08 NOTE — Therapy (Signed)
[place], [place], [address] Phone: [phone]   Fax:  [phone]  Physical Therapy Treatment/Re-certification  Patient Details  Name: Kara Warren MRN: [id number] Date of Birth: [birth year] Referring Provider (PT): Karlton Lemon, MD   Encounter Date: 12/08/2019  PT End of Session - 12/08/19 1021    Visit Number  6    Number of Visits  14    Date for PT Re-Evaluation  01/09/20    Authorization Type  [facility]    PT Start Time  1017    PT Stop Time  1058    PT Time Calculation (min)  41 min    Activity Tolerance  Patient tolerated treatment well    Behavior During Therapy  [facility] for tasks assessed/performed       Past Medical History:  Diagnosis Date  . Abdominal pain, left upper quadrant   . Acute bronchospasm   . Acute neck pain 07/02/2019  . Acute sinusitis, unspecified 05/20/2009   Centricity Description: SINUSITIS - ACUTE-NOS Qualifier: Diagnosis of  By: Birdie Riddle MD, Belenda Cruise   Centricity Description: ACUTE SINUSITIS, UNSPECIFIED Qualifier: Diagnosis of  By: Alveta Heimlich MD, Cornelia Copa   Centricity Description: SINUSITIS, ACUTE Qualifier: Diagnosis of  By: Koleen Nimrod MD, Dellis Filbert    . Atypical chest pain 03/05/2013  . Body mass index (BMI) of 23.0-23.9 in adult   . Cervicalgia   . Colon polyp   . Costochondral junction syndrome   . Cough   . DEPRESSION 08/25/2008   Qualifier: Diagnosis of  By: Ronnald Ramp CMA, Chemira    . Diarrhea   . Diverticulosis 07/27/2012  . Dorsalgia   . Elevated blood pressure reading without diagnosis of hypertension   . Endometrial polyp 8/08   benign  . Family history of osteoporosis 12/28/2011   Mother pt had normal Dexa 11/2009   . Fatigue   . GERD (gastroesophageal reflux disease)   . HIP PAIN, RIGHT 04/12/2009   Qualifier: Diagnosis of  By: Oneida Alar MD, KARL    . History of hiatal hernia   . HYPERLIPIDEMIA 08/25/2008   Qualifier: Diagnosis of  By: Birdie Riddle MD, Belenda Cruise    . IBS  (irritable bowel syndrome)   . Left hip pain 07/30/2013  . Left wrist pain 07/30/2013  . Lumbar strain, initial encounter 07/28/2019  . LUNG NODULE 11/19/2009   Neg CT    . 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 Menopause   . Migraine   . NEUTROPENIA UNSPECIFIED 10/07/2008   Qualifier: Diagnosis of  By: Birdie Riddle MD, Belenda Cruise    . NONSPCIFC ABN FINDING RAD & OTH EXAM LUNG FIELD 11/19/2009   Qualifier: Diagnosis of  By: Alveta Heimlich MD, Cornelia Copa    . Nonspecific (abnormal) findings on radiological and other examination of body structure 11/19/2009   Qualifier: Diagnosis of By: Alveta Heimlich MD, Bennie Dallas list entry automatically replaced. Please review for accuracy.  . Otalgia, unspecified ear   . Pain in throat   . Palpitations   . Peroneal tendinitis of right lower extremity 03/12/2014  . Plantar wart 04/02/2014  . Pneumonia   . RHINITIS 06/21/2009   Qualifier: Diagnosis of  By: Birdie Riddle MD, Belenda Cruise    . Right ankle injury, subsequent encounter 01/11/2018  . Right foot pain 08/09/2011  . Sinusitis, chronic   . Skene's gland abscess 2009   patient unaware  . Sprain of unspecified ligament of right ankle, subsequent encounter   . TRANSAMINASES, SERUM, ELEVATED 12/07/2009  Qualifier: Diagnosis of  By: Inda Castle FNP, Wellington Hampshire   . Unspecified cataract   . Urinary tract infection, site not specified   . Vitamin D deficiency     Past Surgical History:  Procedure Laterality Date  . COLONOSCOPY    . DILATION AND CURETTAGE OF UTERUS  8/08  . GYNECOLOGIC CRYOSURGERY  1980  . HYSTEROSCOPY WITH D & C N/A 03/30/2017   Procedure: DILATATION & CURETTAGE/HYSTEROSCOPY WITH ULTRASOUND GUIDANCE;  Surgeon: Megan Salon, MD;  Location: [facility];  Service: Gynecology;  Laterality: N/A;  . UPPER GI ENDOSCOPY    . uterine ablation  2007    There were no vitals filed for this visit.  Subjective Assessment - 12/08/19 1019    Subjective  Same area of pain but less intensity. I have been doing a  lot more stretching. Denies Rash.         [facility] PT Assessment - 12/08/19 0001      Assessment   Medical Diagnosis  Lt rib pain    Referring Provider (PT)  Karlton Lemon, MD      Observation/Other Assessments   Focus on Therapeutic Outcomes (FOTO)   39% limited      Posture/Postural Control   Posture Comments  flat thoracic spine- notable improvement in forward head at rest      AROM   Overall AROM Comments  mild discomfort felt in Rt rotation    Lumbar - Right Rotation  48    Lumbar - Left Rotation  40      Palpation   Palpation comment  elevation of Lt ribs with decreased intercostal space                   [facility] Adult PT Treatment/Exercise - 12/08/19 0001      Lumbar Exercises: Seated   Other Seated Lumbar Exercises  C-sit with ball support- double and single arm diagonal red tband      Lumbar Exercises: Supine   Other Supine Lumbar Exercises  ball bw knees- side-toside and rotation    Other Supine Lumbar Exercises  oblique curnches- cues for length in torso      Lumbar Exercises: Quadruped   Opposite Arm/Leg Raise  Right arm/Left leg;Left arm/Right leg   cues for rib cage depression     Manual Therapy   Manual Therapy  Taping    Manual therapy comments  skilled palpation and monitoring during TPDN    Joint Mobilization  Lt rib cage depression with breathing in sidelying    Soft tissue mobilization  STM Lt intercostals    Kinesiotex  --   Lt ribs X      Trigger Point Dry Needling - 12/08/19 0001    Dry Needling Comments  -Lt intercostals T5-8 in sidelying           PT Education - 12/08/19 1327    Education Details  goals, FOTO, POC, HEP    Person(s) Educated  Patient    Methods  Explanation;Handout;Demonstration;Tactile cues;Verbal cues    Comprehension  Verbalized understanding;Returned demonstration;Verbal cues required;Tactile cues required;Need further instruction          PT Long Term Goals - 12/08/19 1021      PT LONG TERM  GOAL #1   Title  Able to consistently perform trunk rotation without sharp pain    Baseline  feels just a little something- points to Lt medial rib line    Status  On-going      PT LONG  TERM GOAL #2   Title  Foto to 25% limited    Baseline  43% limited at eval, 39% limited today    Status  On-going      PT LONG TERM GOAL #3   Title  able to perform all household chores without limitation by rib pain    Baseline  sometimes, almost like a burn or catch    Status  On-going      PT LONG TERM GOAL #4   Title  able to perform work activities without limitation by rib pain    Baseline  still getting sudden onset but not as intense    Status  On-going            Plan - 12/08/19 1319    Clinical Impression Statement  Multiple twitches created with DN to rib cage today and soreness reported following as expected. Did not complain of pain into lats, obliques were TTPalong medial rib border as well. Able to complete higher level core exercises without pain. K-tape placed for retraining. Her pain is improving and she is making progress but will benefit from further treatment to continue toward functional goals.    PT Frequency  2x / week    PT Duration  4 weeks    PT Treatment/Interventions  ADLs/Self Care Home Management;Cryotherapy;Electrical Stimulation;Moist Heat;Iontophoresis '4mg'$ /ml Dexamethasone;Therapeutic activities;Therapeutic exercise;Neuromuscular re-education;Manual techniques;Patient/family education;Passive range of motion;Dry needling;Joint Manipulations;Spinal Manipulations;Taping    PT Next Visit Plan  DN PRN, progress qped/core strength, scapular stabilization    PT Home Exercise Plan  upper trapezius and posterior shoulder stretches, corner pec stretch, Theraband rows, extension and supine horizontal abduction, SLR, hip flexor MET, hamstring stretching seated and supine. LTR iwth P-ball, wall push up with a plus, lateral trunk stretch in doorway    Consulted and Agree with  Plan of Care  Patient       Patient will benefit from skilled therapeutic intervention in order to improve the following deficits and impairments:  Increased muscle spasms, Pain, Decreased mobility  Visit Diagnosis: Muscle spasm of back - Plan: PT plan of care cert/re-cert     Problem List Patient Active Problem List   Diagnosis Date Noted  . Lumbar strain, initial encounter 07/28/2019  . Acute neck pain 07/02/2019  . Right ankle injury, subsequent encounter 01/11/2018  . Plantar wart 04/02/2014  . Peroneal tendinitis of right lower extremity 03/12/2014  . Left wrist pain 07/30/2013  . Left hip pain 07/30/2013  . Atypical chest pain 03/05/2013  . Diverticulosis 07/27/2012  . Family history of osteoporosis 12/28/2011  . Right foot pain 08/09/2011  . Sinusitis, chronic   . Colon polyp   . Menopause   . Migraine   . TRANSAMINASES, SERUM, ELEVATED 12/07/2009  . LUNG NODULE 11/19/2009  . Nonspecific (abnormal) findings on radiological and other examination of body structure 11/19/2009  . [place] LUNG FIELD 11/19/2009  . RHINITIS 06/21/2009  . Acute sinusitis, unspecified 05/20/2009  . HIP PAIN, RIGHT 04/12/2009  . NEUTROPENIA UNSPECIFIED 10/07/2008  . HYPERLIPIDEMIA 08/25/2008  . DEPRESSION 08/25/2008    Ryker Sudbury C. Christapher Gillian PT, DPT 12/08/19 1:29 PM   [facility] [address] [place], [place], [address] Phone: [phone]   Fax:  [phone]  Name: FIA HEBERT MRN: [id number] Date of Birth: [birth year]

## 2019-12-15 ENCOUNTER — Other Ambulatory Visit: Payer: Self-pay

## 2019-12-15 ENCOUNTER — Encounter: Payer: Self-pay | Admitting: Physical Therapy

## 2019-12-15 ENCOUNTER — Ambulatory Visit: Payer: 59 | Admitting: Physical Therapy

## 2019-12-15 DIAGNOSIS — M62830 Muscle spasm of back: Secondary | ICD-10-CM | POA: Diagnosis not present

## 2019-12-15 NOTE — Therapy (Signed)
[place], [place], [address] Phone: [phone]   Fax:  [phone]  Physical Therapy Treatment  Patient Details  Name: Kara Warren MRN: [id number] Date of Birth: 04/16/1956 Referring Provider (PT): Karlton Lemon, MD   Encounter Date: 12/15/2019  PT End of Session - 12/15/19 0938    Visit Number  7    Number of Visits  14    Date for PT Re-Evaluation  01/09/20    Authorization Type  [facility] [employer]    PT Start Time  0843    PT Stop Time  0929    PT Time Calculation (min)  46 min    Activity Tolerance  Patient tolerated treatment well    Behavior During Therapy  [facility] for tasks assessed/performed       Past Medical History:  Diagnosis Date  . Abdominal pain, left upper quadrant   . Acute bronchospasm   . Acute neck pain 07/02/2019  . Acute sinusitis, unspecified 05/20/2009   Centricity Description: SINUSITIS - ACUTE-NOS Qualifier: Diagnosis of  By: Birdie Riddle MD, Belenda Cruise   Centricity Description: ACUTE SINUSITIS, UNSPECIFIED Qualifier: Diagnosis of  By: Alveta Heimlich MD, Cornelia Copa   Centricity Description: SINUSITIS, ACUTE Qualifier: Diagnosis of  By: Koleen Nimrod MD, Dellis Filbert    . Atypical chest pain 03/05/2013  . Body mass index (BMI) of 23.0-23.9 in adult   . Cervicalgia   . Colon polyp   . Costochondral junction syndrome   . Cough   . DEPRESSION 08/25/2008   Qualifier: Diagnosis of  By: Ronnald Ramp CMA, Chemira    . Diarrhea   . Diverticulosis 07/27/2012  . Dorsalgia   . Elevated blood pressure reading without diagnosis of hypertension   . Endometrial polyp 8/08   benign  . Family history of osteoporosis 12/28/2011   Mother pt had normal Dexa 11/2009   . Fatigue   . GERD (gastroesophageal reflux disease)   . HIP PAIN, RIGHT 04/12/2009   Qualifier: Diagnosis of  By: Oneida Alar MD, KARL    . History of hiatal hernia   . HYPERLIPIDEMIA 08/25/2008   Qualifier: Diagnosis of  By: Birdie Riddle MD, Belenda Cruise    . IBS (irritable bowel  syndrome)   . Left hip pain 07/30/2013  . Left wrist pain 07/30/2013  . Lumbar strain, initial encounter 07/28/2019  . LUNG NODULE 11/19/2009   Neg CT    . 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 Menopause   . Migraine   . NEUTROPENIA UNSPECIFIED 10/07/2008   Qualifier: Diagnosis of  By: Birdie Riddle MD, Belenda Cruise    . NONSPCIFC ABN FINDING RAD & OTH EXAM LUNG FIELD 11/19/2009   Qualifier: Diagnosis of  By: Alveta Heimlich MD, Cornelia Copa    . Nonspecific (abnormal) findings on radiological and other examination of body structure 11/19/2009   Qualifier: Diagnosis of By: Alveta Heimlich MD, Bennie Dallas list entry automatically replaced. Please review for accuracy.  . Otalgia, unspecified ear   . Pain in throat   . Palpitations   . Peroneal tendinitis of right lower extremity 03/12/2014  . Plantar wart 04/02/2014  . Pneumonia   . RHINITIS 06/21/2009   Qualifier: Diagnosis of  By: Birdie Riddle MD, Belenda Cruise    . Right ankle injury, subsequent encounter 01/11/2018  . Right foot pain 08/09/2011  . Sinusitis, chronic   . Skene's gland abscess 2009   patient unaware  . Sprain of unspecified ligament of right ankle, subsequent encounter   . TRANSAMINASES, SERUM, ELEVATED 12/07/2009  Qualifier: Diagnosis of  By: Inda Castle FNP, Wellington Hampshire   . Unspecified cataract   . Urinary tract infection, site not specified   . Vitamin D deficiency     Past Surgical History:  Procedure Laterality Date  . COLONOSCOPY    . DILATION AND CURETTAGE OF UTERUS  8/08  . GYNECOLOGIC CRYOSURGERY  1980  . HYSTEROSCOPY WITH D & C N/A 03/30/2017   Procedure: DILATATION & CURETTAGE/HYSTEROSCOPY WITH ULTRASOUND GUIDANCE;  Surgeon: Megan Salon, MD;  Location: [facility];  Service: Gynecology;  Laterality: N/A;  . UPPER GI ENDOSCOPY    . uterine ablation  2007    There were no vitals filed for this visit.  Subjective Assessment - 12/15/19 0943    Subjective  No new complaints/concerns this AM. Reports continued gradual improvement  in left costal symptoms.                       [facility] Adult PT Treatment/Exercise - 12/15/19 0001      Lumbar Exercises: Stretches   Other Lumbar Stretch Exercise  sidelying manual QL + oblique stretch 3x30 sec      Lumbar Exercises: Supine   Other Supine Lumbar Exercises  pall off press 7 lbs. on [employer]otion cable x 15 ea. way, cable rotation 7 lbs. x 15 ea. way bilatl    Other Supine Lumbar Exercises  sidebend with DB 6 lbs.  x 15 ea. bilat. for obliques      Lumbar Exercises: Sidelying   Other Sidelying Lumbar Exercises  open book stretch x 10 ea. bilat.      Shoulder Exercises: Supine   Protraction Limitations  serratus punch 4 lbs. ea. UE 2x10    Horizontal ABduction Limitations  supine "chops"/crossing "X" pattern green band x 20 reps      Shoulder Exercises: Standing   Protraction Limitations  closed chain protraction from modified push up position with hands on high low table    Row  AROM;Strengthening;Both;20 reps    Row Limitations  7 lbs. ea. UE with [employer]emotion cable    Other Standing Exercises  Cressy press with foam roll at wall 2x10      Manual Therapy   Soft tissue mobilization  STM/myofascial release left lateral costal region and thoracic paraspinals       Trigger Point Dry Needling - 12/15/19 0001    Consent Given?  Yes    Muscles Treated Upper Quadrant  Latissimus dorsi    Muscles Treated Back/Hip  Erector spinae;Quadratus lumborum   T8 longissimus on left   Dry Needling Comments  included left intercostals and serratus with ribspace block technique, needling in right sidelying, 30 mm 30 gauge needles used except 30 gauge 75 mm needle used for left QL           PT Education - 12/15/19 0938    Education Details  exercises    Person(s) Educated  Patient    Methods  Explanation;Demonstration    Comprehension  Verbalized understanding;Returned demonstration          PT Long Term Goals - 12/08/19 1021      PT LONG TERM GOAL #1    Title  Able to consistently perform trunk rotation without sharp pain    Baseline  feels just a little something- points to Lt medial rib line    Status  On-going      PT LONG TERM GOAL #2   Title  Foto to 25% limited  Baseline  43% limited at eval, 39% limited today    Status  On-going      PT LONG TERM GOAL #3   Title  able to perform all household chores without limitation by rib pain    Baseline  sometimes, almost like a burn or catch    Status  On-going      PT LONG TERM GOAL #4   Title  able to perform work activities without limitation by rib pain    Baseline  still getting sudden onset but not as intense    Status  On-going            Plan - 12/15/19 0939    Clinical Impression Statement  Continued manual tx. as well as dry needling and continued exercise progession for stretches/ROM and scapular and oblique abdominal stabilization. Some soreness with dry needling but otherwise session well-tolerated and continuing to make gradual progress with therapy goals.    Personal Factors and Comorbidities  Comorbidity 1    Comorbidities  h/o cervical pain & LBP    Examination-Activity Limitations  Bend;Sit;Carry;Sleep;Dressing;Stand;Lift;Reach Overhead    Examination-Participation Restrictions  Other    Stability/Clinical Decision Making  Stable/Uncomplicated    Clinical Decision Making  Low    Rehab Potential  Good    PT Frequency  2x / week    PT Duration  4 weeks    PT Treatment/Interventions  ADLs/Self Care Home Management;Cryotherapy;Electrical Stimulation;Moist Heat;Iontophoresis 74m/ml Dexamethasone;Therapeutic activities;Therapeutic exercise;Neuromuscular re-education;Manual techniques;Patient/family education;Passive range of motion;Dry needling;Joint Manipulations;Spinal Manipulations;Taping    PT Next Visit Plan  DN PRN, progress qped/core strength, scapular stabilization    PT Home Exercise Plan  upper trapezius and posterior shoulder stretches, corner pec stretch,  Theraband rows, extension and supine horizontal abduction, SLR, hip flexor MET, hamstring stretching seated and supine. LTR iwth P-ball, wall push up with a plus, lateral trunk stretch in doorway    Consulted and Agree with Plan of Care  Patient       Patient will benefit from skilled therapeutic intervention in order to improve the following deficits and impairments:  Increased muscle spasms, Pain, Decreased mobility  Visit Diagnosis: Muscle spasm of back     Problem List Patient Active Problem List   Diagnosis Date Noted  . Lumbar strain, initial encounter 07/28/2019  . Acute neck pain 07/02/2019  . Right ankle injury, subsequent encounter 01/11/2018  . Plantar wart 04/02/2014  . Peroneal tendinitis of right lower extremity 03/12/2014  . Left wrist pain 07/30/2013  . Left hip pain 07/30/2013  . Atypical chest pain 03/05/2013  . Diverticulosis 07/27/2012  . Family history of osteoporosis 12/28/2011  . Right foot pain 08/09/2011  . Sinusitis, chronic   . Colon polyp   . Menopause   . Migraine   . TRANSAMINASES, SERUM, ELEVATED 12/07/2009  . LUNG NODULE 11/19/2009  . Nonspecific (abnormal) findings on radiological and other examination of body structure 11/19/2009  . [place] 11/19/2009  . RHINITIS 06/21/2009  . Acute sinusitis, unspecified 05/20/2009  . HIP PAIN, RIGHT 04/12/2009  . NEUTROPENIA UNSPECIFIED 10/07/2008  . HYPERLIPIDEMIA 08/25/2008  . DEPRESSION 08/25/2008    CBeaulah Dinning PT, DPT 12/15/19 9:43 AM  [facility] [address]Phone: [phone]  Fax:  [phone] Name: JMIYEKO MAHLUMMRN: [id number] of Birth: [birth year]

## 2019-12-17 MED FILL — FAMOTIDINE 20 MG TABS: 20 | 45 days supply | Qty: 90 | Fill #1

## 2019-12-19 ENCOUNTER — Ambulatory Visit: Payer: 59 | Admitting: Physical Therapy

## 2019-12-19 ENCOUNTER — Other Ambulatory Visit: Payer: Self-pay

## 2019-12-19 ENCOUNTER — Encounter: Payer: Self-pay | Admitting: Physical Therapy

## 2019-12-19 DIAGNOSIS — M62830 Muscle spasm of back: Secondary | ICD-10-CM | POA: Diagnosis not present

## 2019-12-19 NOTE — Therapy (Signed)
[place], [place], [address] Phone: [phone]   Fax:  [phone]  Physical Therapy Treatment  Patient Details  Name: Kara Warren MRN: [id number] Date of Birth: [birth year] Referring Provider (PT): Karlton Lemon, MD   Encounter Date: 12/19/2019  PT End of Session - 12/19/19 0926    Visit Number  8    Number of Visits  14    Date for PT Re-Evaluation  01/09/20    Authorization Type  [facility]    PT Start Time  [phone]    PT Stop Time  0930    PT Time Calculation (min)  46 min    Activity Tolerance  Patient tolerated treatment well    Behavior During Therapy  [facility] for tasks assessed/performed       Past Medical History:  Diagnosis Date  . Abdominal pain, left upper quadrant   . Acute bronchospasm   . Acute neck pain 07/02/2019  . Acute sinusitis, unspecified 05/20/2009   Centricity Description: SINUSITIS - ACUTE-NOS Qualifier: Diagnosis of  By: Birdie Riddle MD, Belenda Cruise   Centricity Description: ACUTE SINUSITIS, UNSPECIFIED Qualifier: Diagnosis of  By: Alveta Heimlich MD, Cornelia Copa   Centricity Description: SINUSITIS, ACUTE Qualifier: Diagnosis of  By: Koleen Nimrod MD, Dellis Filbert    . Atypical chest pain 03/05/2013  . Body mass index (BMI) of 23.0-23.9 in adult   . Cervicalgia   . Colon polyp   . Costochondral junction syndrome   . Cough   . DEPRESSION 08/25/2008   Qualifier: Diagnosis of  By: Ronnald Ramp CMA, Chemira    . Diarrhea   . Diverticulosis 07/27/2012  . Dorsalgia   . Elevated blood pressure reading without diagnosis of hypertension   . Endometrial polyp 8/08   benign  . Family history of osteoporosis 12/28/2011   Mother pt had normal Dexa 11/2009   . Fatigue   . GERD (gastroesophageal reflux disease)   . HIP PAIN, RIGHT 04/12/2009   Qualifier: Diagnosis of  By: Oneida Alar MD, KARL    . History of hiatal hernia   . HYPERLIPIDEMIA 08/25/2008   Qualifier: Diagnosis of  By: Birdie Riddle MD, Belenda Cruise    . IBS (irritable bowel  syndrome)   . Left hip pain 07/30/2013  . Left wrist pain 07/30/2013  . Lumbar strain, initial encounter 07/28/2019  . LUNG NODULE 11/19/2009   Neg CT    . 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 Menopause   . Migraine   . NEUTROPENIA UNSPECIFIED 10/07/2008   Qualifier: Diagnosis of  By: Birdie Riddle MD, Belenda Cruise    . NONSPCIFC ABN FINDING RAD & OTH EXAM LUNG FIELD 11/19/2009   Qualifier: Diagnosis of  By: Alveta Heimlich MD, Cornelia Copa    . Nonspecific (abnormal) findings on radiological and other examination of body structure 11/19/2009   Qualifier: Diagnosis of By: Alveta Heimlich MD, Bennie Dallas list entry automatically replaced. Please review for accuracy.  . Otalgia, unspecified ear   . Pain in throat   . Palpitations   . Peroneal tendinitis of right lower extremity 03/12/2014  . Plantar wart 04/02/2014  . Pneumonia   . RHINITIS 06/21/2009   Qualifier: Diagnosis of  By: Birdie Riddle MD, Belenda Cruise    . Right ankle injury, subsequent encounter 01/11/2018  . Right foot pain 08/09/2011  . Sinusitis, chronic   . Skene's gland abscess 2009   patient unaware  . Sprain of unspecified ligament of right ankle, subsequent encounter   . TRANSAMINASES, SERUM, ELEVATED 12/07/2009  Qualifier: Diagnosis of  By: Inda Castle FNP, Wellington Hampshire   . Unspecified cataract   . Urinary tract infection, site not specified   . Vitamin D deficiency     Past Surgical History:  Procedure Laterality Date  . COLONOSCOPY    . DILATION AND CURETTAGE OF UTERUS  8/08  . GYNECOLOGIC CRYOSURGERY  1980  . HYSTEROSCOPY WITH D & C N/A 03/30/2017   Procedure: DILATATION & CURETTAGE/HYSTEROSCOPY WITH ULTRASOUND GUIDANCE;  Surgeon: Megan Salon, MD;  Location: [facility];  Service: Gynecology;  Laterality: N/A;  . UPPER GI ENDOSCOPY    . uterine ablation  2007    There were no vitals filed for this visit.  Subjective Assessment - 12/19/19 0846    Subjective  "Stiff"/"a little tight" left costal region but otherwise no pain this AM.          [facility] PT Assessment - 12/19/19 0001      AROM   Lumbar - Right Rotation  60    Lumbar - Left Rotation  60                   [facility] Adult PT Treatment/Exercise - 12/19/19 0001      Lumbar Exercises: Quadruped   Other Quadruped Lumbar Exercises  cat/camel x 15 reps, seated trunk rotation with dowel on 65 cm P-ball x 15 reps      Shoulder Exercises: Seated   Other Seated Exercises  Diagonal "chops" D1-2 with red band seated on 65 cm P-ball x 15 reps      Shoulder Exercises: Prone   Other Prone Exercises  protraction/"push up with a plus" with UE on high low table 2x10      Manual Therapy   Joint Mobilization  thoracic PAs (focus lower thoracic region) grade I-III    Soft tissue mobilization  STM/myofascial release left lateral costal region and thoracic paraspinals       Trigger Point Dry Needling - 12/19/19 0001    Consent Given?  Yes    Muscles Treated Back/Hip  Erector spinae   T7-8 longissimus on left   Dry Needling Comments  included left intercostals and serratus with ribspace block technique, needling in right sidelying, 30 mm 32 gauge needles used           PT Education - 12/19/19 0925    Education Details  exercises, POC    Person(s) Educated  Patient    Methods  Explanation    Comprehension  Verbalized understanding          PT Long Term Goals - 12/08/19 1021      PT LONG TERM GOAL #1   Title  Able to consistently perform trunk rotation without sharp pain    Baseline  feels just a little something- points to Lt medial rib line    Status  On-going      PT LONG TERM GOAL #2   Title  Foto to 25% limited    Baseline  43% limited at eval, 39% limited today    Status  On-going      PT LONG TERM GOAL #3   Title  able to perform all household chores without limitation by rib pain    Baseline  sometimes, almost like a burn or catch    Status  On-going      PT LONG TERM GOAL #4   Title  able to perform work activities without  limitation by rib pain    Baseline  still getting  sudden onset but not as intense    Status  On-going            Plan - 12/19/19 0926    Clinical Impression Statement  More manual focus today with STM to address thoracic paraspinal tightness but otherwise continued ROM and periscapular stabilization progression as noted per flowshett. Pt. continues to make gradual improvements for decreased pain and improved ADL tolerance.    Personal Factors and Comorbidities  Comorbidity 1    Comorbidities  h/o cervical pain & LBP    Examination-Activity Limitations  Bend;Sit;Carry;Sleep;Dressing;Stand;Lift;Reach Overhead    Examination-Participation Restrictions  Other    Stability/Clinical Decision Making  Stable/Uncomplicated    Rehab Potential  Good    PT Frequency  2x / week    PT Duration  4 weeks    PT Treatment/Interventions  ADLs/Self Care Home Management;Cryotherapy;Electrical Stimulation;Moist Heat;Iontophoresis '4mg'$ /ml Dexamethasone;Therapeutic activities;Therapeutic exercise;Neuromuscular re-education;Manual techniques;Patient/family education;Passive range of motion;Dry needling;Joint Manipulations;Spinal Manipulations;Taping    PT Next Visit Plan  DN PRN, progress qped/core strength, scapular stabilization    PT Home Exercise Plan  upper trapezius and posterior shoulder stretches, corner pec stretch, Theraband rows, extension and supine horizontal abduction, SLR, hip flexor MET, hamstring stretching seated and supine. LTR iwth P-ball, wall push up with a plus, lateral trunk stretch in doorway    Consulted and Agree with Plan of Care  Patient       Patient will benefit from skilled therapeutic intervention in order to improve the following deficits and impairments:  Increased muscle spasms, Pain, Decreased mobility  Visit Diagnosis: Muscle spasm of back     Problem List Patient Active Problem List   Diagnosis Date Noted  . Lumbar strain, initial encounter 07/28/2019  . Acute  neck pain 07/02/2019  . Right ankle injury, subsequent encounter 01/11/2018  . Plantar wart 04/02/2014  . Peroneal tendinitis of right lower extremity 03/12/2014  . Left wrist pain 07/30/2013  . Left hip pain 07/30/2013  . Atypical chest pain 03/05/2013  . Diverticulosis 07/27/2012  . Family history of osteoporosis 12/28/2011  . Right foot pain 08/09/2011  . Sinusitis, chronic   . Colon polyp   . Menopause   . Migraine   . TRANSAMINASES, SERUM, ELEVATED 12/07/2009  . LUNG NODULE 11/19/2009  . Nonspecific (abnormal) findings on radiological and other examination of body structure 11/19/2009  . [place] LUNG FIELD 11/19/2009  . RHINITIS 06/21/2009  . Acute sinusitis, unspecified 05/20/2009  . HIP PAIN, RIGHT 04/12/2009  . NEUTROPENIA UNSPECIFIED 10/07/2008  . HYPERLIPIDEMIA 08/25/2008  . DEPRESSION 08/25/2008    Beaulah Dinning, PT, DPT 12/19/19 9:30 AM  [facility] [address] [place], [place], [address] Phone: [phone]   Fax:  [phone]  Name: Kara Warren MRN: [id number] Date of Birth: 12/08/1955

## 2019-12-22 ENCOUNTER — Other Ambulatory Visit: Payer: Self-pay

## 2019-12-22 ENCOUNTER — Ambulatory Visit: Payer: 59 | Admitting: Physical Therapy

## 2019-12-22 ENCOUNTER — Encounter: Payer: Self-pay | Admitting: Physical Therapy

## 2019-12-22 DIAGNOSIS — M549 Dorsalgia, unspecified: Secondary | ICD-10-CM | POA: Diagnosis not present

## 2019-12-22 DIAGNOSIS — F411 Generalized anxiety disorder: Secondary | ICD-10-CM | POA: Diagnosis not present

## 2019-12-22 DIAGNOSIS — J309 Allergic rhinitis, unspecified: Secondary | ICD-10-CM | POA: Diagnosis not present

## 2019-12-22 DIAGNOSIS — K219 Gastro-esophageal reflux disease without esophagitis: Secondary | ICD-10-CM | POA: Diagnosis not present

## 2019-12-22 DIAGNOSIS — M62830 Muscle spasm of back: Secondary | ICD-10-CM | POA: Diagnosis not present

## 2019-12-22 DIAGNOSIS — I4891 Unspecified atrial fibrillation: Secondary | ICD-10-CM | POA: Diagnosis not present

## 2019-12-22 MED FILL — AZELASTINE HCL 137 MCG SPRY: 0.1 | 75 days supply | Qty: 90 | Fill #0

## 2019-12-22 MED FILL — DICLOFENAC SODIUM 1 % GEL: 1 | 19 days supply | Qty: 300 | Fill #0

## 2019-12-22 MED FILL — MONTELUKAST SOD 10 MG TAB: 10 | 90 days supply | Qty: 90 | Fill #0

## 2019-12-22 NOTE — Therapy (Signed)
[place], [place], [address] Phone: [phone]   Fax:  [phone]  Physical Therapy Treatment  Patient Details  Name: Kara Warren MRN: [id number] Date of Birth: 08-Jun-1956 Referring Provider (PT): Karlton Lemon, MD   Encounter Date: 12/22/2019  PT End of Session - 12/22/19 1047    Visit Number  9    Number of Visits  14    Date for PT Re-Evaluation  01/09/20    Authorization Type  [facility] [employer]    PT Start Time  1010    PT Stop Time  1057    PT Time Calculation (min)  47 min    Activity Tolerance  Patient tolerated treatment well    Behavior During Therapy  [facility] for tasks assessed/performed       Past Medical History:  Diagnosis Date  . Abdominal pain, left upper quadrant   . Acute bronchospasm   . Acute neck pain 07/02/2019  . Acute sinusitis, unspecified 05/20/2009   Centricity Description: SINUSITIS - ACUTE-NOS Qualifier: Diagnosis of  By: Birdie Riddle MD, Belenda Cruise   Centricity Description: ACUTE SINUSITIS, UNSPECIFIED Qualifier: Diagnosis of  By: Alveta Heimlich MD, Cornelia Copa   Centricity Description: SINUSITIS, ACUTE Qualifier: Diagnosis of  By: Koleen Nimrod MD, Dellis Filbert    . Atypical chest pain 03/05/2013  . Body mass index (BMI) of 23.0-23.9 in adult   . Cervicalgia   . Colon polyp   . Costochondral junction syndrome   . Cough   . DEPRESSION 08/25/2008   Qualifier: Diagnosis of  By: Ronnald Ramp CMA, Chemira    . Diarrhea   . Diverticulosis 07/27/2012  . Dorsalgia   . Elevated blood pressure reading without diagnosis of hypertension   . Endometrial polyp 8/08   benign  . Family history of osteoporosis 12/28/2011   Mother pt had normal Dexa 11/2009   . Fatigue   . GERD (gastroesophageal reflux disease)   . HIP PAIN, RIGHT 04/12/2009   Qualifier: Diagnosis of  By: Oneida Alar MD, KARL    . History of hiatal hernia   . HYPERLIPIDEMIA 08/25/2008   Qualifier: Diagnosis of  By: Birdie Riddle MD, Belenda Cruise    . IBS (irritable bowel  syndrome)   . Left hip pain 07/30/2013  . Left wrist pain 07/30/2013  . Lumbar strain, initial encounter 07/28/2019  . LUNG NODULE 11/19/2009   Neg CT    . 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 Menopause   . Migraine   . NEUTROPENIA UNSPECIFIED 10/07/2008   Qualifier: Diagnosis of  By: Birdie Riddle MD, Belenda Cruise    . NONSPCIFC ABN FINDING RAD & OTH EXAM LUNG FIELD 11/19/2009   Qualifier: Diagnosis of  By: Alveta Heimlich MD, Cornelia Copa    . Nonspecific (abnormal) findings on radiological and other examination of body structure 11/19/2009   Qualifier: Diagnosis of By: Alveta Heimlich MD, Bennie Dallas list entry automatically replaced. Please review for accuracy.  . Otalgia, unspecified ear   . Pain in throat   . Palpitations   . Peroneal tendinitis of right lower extremity 03/12/2014  . Plantar wart 04/02/2014  . Pneumonia   . RHINITIS 06/21/2009   Qualifier: Diagnosis of  By: Birdie Riddle MD, Belenda Cruise    . Right ankle injury, subsequent encounter 01/11/2018  . Right foot pain 08/09/2011  . Sinusitis, chronic   . Skene's gland abscess 2009   patient unaware  . Sprain of unspecified ligament of right ankle, subsequent encounter   . TRANSAMINASES, SERUM, ELEVATED 12/07/2009  Qualifier: Diagnosis of  By: Inda Castle FNP, Wellington Hampshire   . Unspecified cataract   . Urinary tract infection, site not specified   . Vitamin D deficiency     Past Surgical History:  Procedure Laterality Date  . COLONOSCOPY    . DILATION AND CURETTAGE OF UTERUS  8/08  . GYNECOLOGIC CRYOSURGERY  1980  . HYSTEROSCOPY WITH D & C N/A 03/30/2017   Procedure: DILATATION & CURETTAGE/HYSTEROSCOPY WITH ULTRASOUND GUIDANCE;  Surgeon: Megan Salon, MD;  Location: [facility];  Service: Gynecology;  Laterality: N/A;  . UPPER GI ENDOSCOPY    . uterine ablation  2007    There were no vitals filed for this visit.  Subjective Assessment - 12/22/19 1011    Subjective  A little sore after last treatment but did OK over the weekend. No pain  immediately pre-tx. but noting some intermittent sharp pain in left costal region. "All in all getting better."    Currently in Pain?  No/denies                       [facility] Adult PT Treatment/Exercise - 12/22/19 0001      Lumbar Exercises: Stretches   Lower Trunk Rotation Limitations  Right LTR with lges on 55 cm P-ball x 15 reps, 5 sec holds    Other Lumbar Stretch Exercise  Left QL/lateral trunk stretch in right sidelying 30 sec x 3      Lumbar Exercises: Standing   Other Standing Lumbar Exercises  Freemotion cable chop x 15 ea. way bilat. with 10 lbs., pall off press x 15 ea. way (Freemotion cables) with 10 lbs.      Lumbar Exercises: Sidelying   Other Sidelying Lumbar Exercises  "open book" stretch from right sidelying x 15 reps 5 sec holds      Shoulder Exercises: Seated   Other Seated Exercises  Diagonal "chops" D1-2 with red band seated on 65 cm P-ball x 20 reps      Shoulder Exercises: Standing   Protraction Limitations  closed chain protraction/serratus punch with hands on high low table 2x10    Row Limitations  Freemotion bilat. cable row 10 lbs. 2x10      Manual Therapy   Joint Mobilization  thoracic PAs (focus lower thoracic region) grade I-III    Soft tissue mobilization  STM/myofascial release left lateral costal region and thoracic paraspinals       Trigger Point Dry Needling - 12/22/19 0001    Consent Given?  Yes    Muscles Treated Back/Hip  Erector spinae   left thoracic longissimus at T11-12 region   Dry Needling Comments  included also left intercostals and serratus in right sidelying, 30-32 gauge 30 mm needles used except 50 mm for thoracic paraspinals used 50 mm with transverse oblique insertion with "shelf" technique                PT Long Term Goals - 12/08/19 1021      PT LONG TERM GOAL #1   Title  Able to consistently perform trunk rotation without sharp pain    Baseline  feels just a little something- points to Lt medial rib  line    Status  On-going      PT LONG TERM GOAL #2   Title  Foto to 25% limited    Baseline  43% limited at eval, 39% limited today    Status  On-going      PT LONG TERM GOAL #3  Title  able to perform all household chores without limitation by rib pain    Baseline  sometimes, almost like a burn or catch    Status  On-going      PT LONG TERM GOAL #4   Title  able to perform work activities without limitation by rib pain    Baseline  still getting sudden onset but not as intense    Status  On-going            Plan - 12/22/19 1048    Clinical Impression Statement  Continued previous tx. emphasis with progression rotation core/trunk stability and periscapular stabilization along with manual and dry needling.  No specific exacerbating cause for continued intermittent symptoms but pt. is making gradual progress for decreased pain and functional gains for activity and positional tolerance.    Personal Factors and Comorbidities  Comorbidity 1    Comorbidities  h/o cervical pain & LBP    Examination-Activity Limitations  Bend;Sit;Carry;Sleep;Dressing;Stand;Lift;Reach Overhead    Examination-Participation Restrictions  Other    Stability/Clinical Decision Making  Stable/Uncomplicated    Clinical Decision Making  Low    Rehab Potential  Good    PT Frequency  2x / week    PT Duration  4 weeks    PT Treatment/Interventions  ADLs/Self Care Home Management;Cryotherapy;Electrical Stimulation;Moist Heat;Iontophoresis '4mg'$ /ml Dexamethasone;Therapeutic activities;Therapeutic exercise;Neuromuscular re-education;Manual techniques;Patient/family education;Passive range of motion;Dry needling;Joint Manipulations;Spinal Manipulations;Taping    PT Next Visit Plan  DN PRN, progress qped/core strength, scapular stabilization    PT Home Exercise Plan  upper trapezius and posterior shoulder stretches, corner pec stretch, Theraband rows, extension and supine horizontal abduction, SLR, hip flexor MET,  hamstring stretching seated and supine. LTR iwth P-ball, wall push up with a plus, lateral trunk stretch in doorway    Consulted and Agree with Plan of Care  Patient       Patient will benefit from skilled therapeutic intervention in order to improve the following deficits and impairments:  Increased muscle spasms, Pain, Decreased mobility  Visit Diagnosis: Muscle spasm of back     Problem List Patient Active Problem List   Diagnosis Date Noted  . Lumbar strain, initial encounter 07/28/2019  . Acute neck pain 07/02/2019  . Right ankle injury, subsequent encounter 01/11/2018  . Plantar wart 04/02/2014  . Peroneal tendinitis of right lower extremity 03/12/2014  . Left wrist pain 07/30/2013  . Left hip pain 07/30/2013  . Atypical chest pain 03/05/2013  . Diverticulosis 07/27/2012  . Family history of osteoporosis 12/28/2011  . Right foot pain 08/09/2011  . Sinusitis, chronic   . Colon polyp   . Menopause   . Migraine   . TRANSAMINASES, SERUM, ELEVATED 12/07/2009  . LUNG NODULE 11/19/2009  . Nonspecific (abnormal) findings on radiological and other examination of body structure 11/19/2009  . [place] LUNG FIELD 11/19/2009  . RHINITIS 06/21/2009  . Acute sinusitis, unspecified 05/20/2009  . HIP PAIN, RIGHT 04/12/2009  . NEUTROPENIA UNSPECIFIED 10/07/2008  . HYPERLIPIDEMIA 08/25/2008  . DEPRESSION 08/25/2008    Beaulah Dinning, PT, DPT 12/22/19 10:58 AM  [facility] [address] [place], [place], [address] Phone: [phone]   Fax:  [phone]  Name: Kara Warren MRN: [id number] Date of Birth: [birth year]

## 2019-12-23 MED FILL — OMEPRAZOLE 40 MG CPDR: 40 | 30 days supply | Qty: 30 | Fill #1

## 2019-12-25 ENCOUNTER — Encounter: Payer: Self-pay | Admitting: Physical Therapy

## 2019-12-25 ENCOUNTER — Ambulatory Visit: Payer: 59 | Admitting: Physical Therapy

## 2019-12-25 ENCOUNTER — Other Ambulatory Visit: Payer: Self-pay

## 2019-12-25 DIAGNOSIS — M62830 Muscle spasm of back: Secondary | ICD-10-CM | POA: Diagnosis not present

## 2019-12-25 NOTE — Therapy (Signed)
[place], [place], [address] Phone: [phone]   Fax:  [phone]  Physical Therapy Treatment  Patient Details  Name: Kara Warren MRN: [id number] Date of Birth: Jun 24, 1956 Referring Provider (PT): Karlton Lemon, MD   Encounter Date: 12/25/2019  PT End of Session - 12/25/19 1143    Visit Number  10    Number of Visits  14    Date for PT Re-Evaluation  01/09/20    Authorization Type  [facility]    PT Start Time  1145    PT Stop Time  1225    PT Time Calculation (min)  40 min    Activity Tolerance  Patient tolerated treatment well    Behavior During Therapy  [facility] for tasks assessed/performed       Past Medical History:  Diagnosis Date  . Abdominal pain, left upper quadrant   . Acute bronchospasm   . Acute neck pain 07/02/2019  . Acute sinusitis, unspecified 05/20/2009   Centricity Description: SINUSITIS - ACUTE-NOS Qualifier: Diagnosis of  By: Birdie Riddle MD, Belenda Cruise   Centricity Description: ACUTE SINUSITIS, UNSPECIFIED Qualifier: Diagnosis of  By: Alveta Heimlich MD, Cornelia Copa   Centricity Description: SINUSITIS, ACUTE Qualifier: Diagnosis of  By: Koleen Nimrod MD, Dellis Filbert    . Atypical chest pain 03/05/2013  . Body mass index (BMI) of 23.0-23.9 in adult   . Cervicalgia   . Colon polyp   . Costochondral junction syndrome   . Cough   . DEPRESSION 08/25/2008   Qualifier: Diagnosis of  By: Ronnald Ramp CMA, Chemira    . Diarrhea   . Diverticulosis 07/27/2012  . Dorsalgia   . Elevated blood pressure reading without diagnosis of hypertension   . Endometrial polyp 8/08   benign  . Family history of osteoporosis 12/28/2011   Mother pt had normal Dexa 11/2009   . Fatigue   . GERD (gastroesophageal reflux disease)   . HIP PAIN, RIGHT 04/12/2009   Qualifier: Diagnosis of  By: Oneida Alar MD, KARL    . History of hiatal hernia   . HYPERLIPIDEMIA 08/25/2008   Qualifier: Diagnosis of  By: Birdie Riddle MD, Belenda Cruise    . IBS (irritable bowel  syndrome)   . Left hip pain 07/30/2013  . Left wrist pain 07/30/2013  . Lumbar strain, initial encounter 07/28/2019  . LUNG NODULE 11/19/2009   Neg CT    . 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 Menopause   . Migraine   . NEUTROPENIA UNSPECIFIED 10/07/2008   Qualifier: Diagnosis of  By: Birdie Riddle MD, Belenda Cruise    . NONSPCIFC ABN FINDING RAD & OTH EXAM LUNG FIELD 11/19/2009   Qualifier: Diagnosis of  By: Alveta Heimlich MD, Cornelia Copa    . Nonspecific (abnormal) findings on radiological and other examination of body structure 11/19/2009   Qualifier: Diagnosis of By: Alveta Heimlich MD, Bennie Dallas list entry automatically replaced. Please review for accuracy.  . Otalgia, unspecified ear   . Pain in throat   . Palpitations   . Peroneal tendinitis of right lower extremity 03/12/2014  . Plantar wart 04/02/2014  . Pneumonia   . RHINITIS 06/21/2009   Qualifier: Diagnosis of  By: Birdie Riddle MD, Belenda Cruise    . Right ankle injury, subsequent encounter 01/11/2018  . Right foot pain 08/09/2011  . Sinusitis, chronic   . Skene's gland abscess 2009   patient unaware  . Sprain of unspecified ligament of right ankle, subsequent encounter   . TRANSAMINASES, SERUM, ELEVATED 12/07/2009  Qualifier: Diagnosis of  By: Inda Castle FNP, Wellington Hampshire   . Unspecified cataract   . Urinary tract infection, site not specified   . Vitamin D deficiency     Past Surgical History:  Procedure Laterality Date  . COLONOSCOPY    . DILATION AND CURETTAGE OF UTERUS  8/08  . GYNECOLOGIC CRYOSURGERY  1980  . HYSTEROSCOPY WITH D & C N/A 03/30/2017   Procedure: DILATATION & CURETTAGE/HYSTEROSCOPY WITH ULTRASOUND GUIDANCE;  Surgeon: Megan Salon, MD;  Location: [facility];  Service: Gynecology;  Laterality: N/A;  . UPPER GI ENDOSCOPY    . uterine ablation  2007    There were no vitals filed for this visit.  Subjective Assessment - 12/25/19 1146    Subjective  Did pilates this AM without discomfort. I feel some tightness but not  pain.    Currently in Pain?  No/denies                       [facility] Adult PT Treatment/Exercise - 12/25/19 0001      Lumbar Exercises: Stretches   Quadruped Mid Back Stretch  3 reps;10 seconds      Lumbar Exercises: Standing   Other Standing Lumbar Exercises  kneeling antirotation and rotation with UE extended    Other Standing Lumbar Exercises  high kneeling hinge back with twist      Lumbar Exercises: Quadruped   Other Quadruped Lumbar Exercises  high plank-down dog      Knee/Hip Exercises: Stretches   Passive Hamstring Stretch  Both;30 seconds      Knee/Hip Exercises: Sidelying   Other Sidelying Knee/Hip Exercises  side plank with rotation, float top leg      Manual Therapy   Kinesiotex  --   Lt rib cage X                 PT Long Term Goals - 12/08/19 1021      PT LONG TERM GOAL #1   Title  Able to consistently perform trunk rotation without sharp pain    Baseline  feels just a little something- points to Lt medial rib line    Status  On-going      PT LONG TERM GOAL #2   Title  Foto to 25% limited    Baseline  43% limited at eval, 39% limited today    Status  On-going      PT LONG TERM GOAL #3   Title  able to perform all household chores without limitation by rib pain    Baseline  sometimes, almost like a burn or catch    Status  On-going      PT LONG TERM GOAL #4   Title  able to perform work activities without limitation by rib pain    Baseline  still getting sudden onset but not as intense    Status  On-going            Plan - 12/25/19 1257    Clinical Impression Statement  Progressed core strenghtening exercises to inc[employer] & [place] of UEs. Good tolerance without pain exacerbations. Talked her through application of k-tape to ribs which she reported was helpful last time.    PT Treatment/Interventions  ADLs/Self Care Home Management;Cryotherapy;Electrical Stimulation;Moist Heat;Iontophoresis 71m/ml  Dexamethasone;Therapeutic activities;Therapeutic exercise;Neuromuscular re-education;Manual techniques;Patient/family education;Passive range of motion;Dry needling;Joint Manipulations;Spinal Manipulations;Taping    PT Next Visit Plan  review HEP & discharge    PT Home Exercise Plan  upper trapezius and  posterior shoulder stretches, corner pec stretch, Theraband rows, extension and supine horizontal abduction, SLR, hip flexor MET, hamstring stretching seated and supine. LTR iwth P-ball, wall push up with a plus, lateral trunk stretch in doorway    Consulted and Agree with Plan of Care  Patient       Patient will benefit from skilled therapeutic intervention in order to improve the following deficits and impairments:  Increased muscle spasms, Pain, Decreased mobility  Visit Diagnosis: Muscle spasm of back     Problem List Patient Active Problem List   Diagnosis Date Noted  . Lumbar strain, initial encounter 07/28/2019  . Acute neck pain 07/02/2019  . Right ankle injury, subsequent encounter 01/11/2018  . Plantar wart 04/02/2014  . Peroneal tendinitis of right lower extremity 03/12/2014  . Left wrist pain 07/30/2013  . Left hip pain 07/30/2013  . Atypical chest pain 03/05/2013  . Diverticulosis 07/27/2012  . Family history of osteoporosis 12/28/2011  . Right foot pain 08/09/2011  . Sinusitis, chronic   . Colon polyp   . Menopause   . Migraine   . TRANSAMINASES, SERUM, ELEVATED 12/07/2009  . LUNG NODULE 11/19/2009  . Nonspecific (abnormal) findings on radiological and other examination of body structure 11/19/2009  . [place] LUNG FIELD 11/19/2009  . RHINITIS 06/21/2009  . Acute sinusitis, unspecified 05/20/2009  . HIP PAIN, RIGHT 04/12/2009  . NEUTROPENIA UNSPECIFIED 10/07/2008  . HYPERLIPIDEMIA 08/25/2008  . DEPRESSION 08/25/2008    Camry Theiss C. Jin Capote PT, DPT 12/25/19 1:01 PM   [facility] [address] [place], [place], [address] Phone: [phone]   Fax:  [phone]  Name: Kara Warren MRN: [id number] Date of Birth: 05/04/1956

## 2019-12-26 ENCOUNTER — Ambulatory Visit: Payer: 59 | Admitting: Rheumatology

## 2019-12-26 MED FILL — CITALOPRAM HBR 20 MG TABLET: 20 | 90 days supply | Qty: 90 | Fill #0

## 2019-12-29 ENCOUNTER — Encounter: Payer: Self-pay | Admitting: Physical Therapy

## 2019-12-29 ENCOUNTER — Ambulatory Visit: Payer: 59 | Admitting: Physical Therapy

## 2019-12-29 ENCOUNTER — Other Ambulatory Visit: Payer: Self-pay

## 2019-12-29 DIAGNOSIS — M62830 Muscle spasm of back: Secondary | ICD-10-CM

## 2019-12-29 MED FILL — METOPROLOL SUCCINATE ER 25: 25 | 90 days supply | Qty: 90 | Fill #1

## 2019-12-29 NOTE — Therapy (Signed)
[place], [place], [address] Phone: [phone]   Fax:  [phone]  Physical Therapy Treatment/Discharge  Patient Details  Name: Kara Warren MRN: [id number] Date of Birth: [birth year] Referring Provider (PT): Karlton Lemon, MD   Encounter Date: 12/29/2019  PT End of Session - 12/29/19 1126    Visit Number  11    Number of Visits  14    Date for PT Re-Evaluation  01/09/20    Authorization Type  [facility]    PT Start Time  1056    PT Stop Time  1140    PT Time Calculation (min)  44 min    Activity Tolerance  Patient tolerated treatment well    Behavior During Therapy  [facility] for tasks assessed/performed       Past Medical History:  Diagnosis Date  . Abdominal pain, left upper quadrant   . Acute bronchospasm   . Acute neck pain 07/02/2019  . Acute sinusitis, unspecified 05/20/2009   Centricity Description: SINUSITIS - ACUTE-NOS Qualifier: Diagnosis of  By: Birdie Riddle MD, Belenda Cruise   Centricity Description: ACUTE SINUSITIS, UNSPECIFIED Qualifier: Diagnosis of  By: Alveta Heimlich MD, Cornelia Copa   Centricity Description: SINUSITIS, ACUTE Qualifier: Diagnosis of  By: Koleen Nimrod MD, Dellis Filbert    . Atypical chest pain 03/05/2013  . Body mass index (BMI) of 23.0-23.9 in adult   . Cervicalgia   . Colon polyp   . Costochondral junction syndrome   . Cough   . DEPRESSION 08/25/2008   Qualifier: Diagnosis of  By: Ronnald Ramp CMA, Chemira    . Diarrhea   . Diverticulosis 07/27/2012  . Dorsalgia   . Elevated blood pressure reading without diagnosis of hypertension   . Endometrial polyp 8/08   benign  . Family history of osteoporosis 12/28/2011   Mother pt had normal Dexa 11/2009   . Fatigue   . GERD (gastroesophageal reflux disease)   . HIP PAIN, RIGHT 04/12/2009   Qualifier: Diagnosis of  By: Oneida Alar MD, KARL    . History of hiatal hernia   . HYPERLIPIDEMIA 08/25/2008   Qualifier: Diagnosis of  By: Birdie Riddle MD, Belenda Cruise    . IBS  (irritable bowel syndrome)   . Left hip pain 07/30/2013  . Left wrist pain 07/30/2013  . Lumbar strain, initial encounter 07/28/2019  . LUNG NODULE 11/19/2009   Neg CT    . 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 Menopause   . Migraine   . NEUTROPENIA UNSPECIFIED 10/07/2008   Qualifier: Diagnosis of  By: Birdie Riddle MD, Belenda Cruise    . NONSPCIFC ABN FINDING RAD & OTH EXAM LUNG FIELD 11/19/2009   Qualifier: Diagnosis of  By: Alveta Heimlich MD, Cornelia Copa    . Nonspecific (abnormal) findings on radiological and other examination of body structure 11/19/2009   Qualifier: Diagnosis of By: Alveta Heimlich MD, Bennie Dallas list entry automatically replaced. Please review for accuracy.  . Otalgia, unspecified ear   . Pain in throat   . Palpitations   . Peroneal tendinitis of right lower extremity 03/12/2014  . Plantar wart 04/02/2014  . Pneumonia   . RHINITIS 06/21/2009   Qualifier: Diagnosis of  By: Birdie Riddle MD, Belenda Cruise    . Right ankle injury, subsequent encounter 01/11/2018  . Right foot pain 08/09/2011  . Sinusitis, chronic   . Skene's gland abscess 2009   patient unaware  . Sprain of unspecified ligament of right ankle, subsequent encounter   . TRANSAMINASES, SERUM, ELEVATED 12/07/2009  Qualifier: Diagnosis of  By: Inda Castle FNP, Wellington Hampshire   . Unspecified cataract   . Urinary tract infection, site not specified   . Vitamin D deficiency     Past Surgical History:  Procedure Laterality Date  . COLONOSCOPY    . DILATION AND CURETTAGE OF UTERUS  8/08  . GYNECOLOGIC CRYOSURGERY  1980  . HYSTEROSCOPY WITH D & C N/A 03/30/2017   Procedure: DILATATION & CURETTAGE/HYSTEROSCOPY WITH ULTRASOUND GUIDANCE;  Surgeon: Megan Salon, MD;  Location: [facility];  Service: Gynecology;  Laterality: N/A;  . UPPER GI ENDOSCOPY    . uterine ablation  2007    There were no vitals filed for this visit.  Subjective Assessment - 12/29/19 1102    Subjective  K-tape helped so bought some to use at home. No pain  pre-tx. Pt. rates improvement at 80-90% from baseline and in agreement iwth plans to d/c to HEP today.    Pertinent History  per pt. history chronic neck tension otherwise no significant related PMH    Currently in Pain?  No/denies         [facility] PT Assessment - 12/29/19 0001      Observation/Other Assessments   Focus on Therapeutic Outcomes (FOTO)   14% limited                   [facility] Adult PT Treatment/Exercise - 12/29/19 0001      Lumbar Exercises: Sidelying   Other Sidelying Lumbar Exercises  plank with rotation x 10 ea. way bilat.      Shoulder Exercises: Supine   Protraction  AROM;Strengthening;20 reps    Protraction Weight (lbs)  5      Shoulder Exercises: Standing   Horizontal ABduction Limitations  2x10 in supine with blue band    Extension  Strengthening;Both;20 reps    Theraband Level (Shoulder Extension)  Level 4 (Blue)    Row  AROM;Strengthening;Both;20 reps    Theraband Level (Shoulder Row)  Level 4 (Blue)    Other Standing Exercises  wall push up wth a plus 2x10    Other Standing Exercises  HEP review pall off press and woodchops with Blue Theraband      Manual Therapy   Soft tissue mobilization  STM left serratus and latissimus dorsi        Trigger Point Dry Needling - 12/29/19 0001    Consent Given?  Yes    Dry Needling Comments  dry needling x 5 min of left latissimus dorsi and serratus anterior with 30 mm 32 gauge needles           PT Education - 12/29/19 1144    Education Details  HEP updates, POC    Person(s) Educated  Patient    Methods  Explanation;Demonstration;Verbal cues;Handout    Comprehension  Returned demonstration;Verbalized understanding          PT Long Term Goals - 12/29/19 1131      PT LONG TERM GOAL #1   Title  Able to consistently perform trunk rotation without sharp pain    Baseline  met    Time  4    Period  Weeks    Status  Achieved      PT LONG TERM GOAL #3   Title  able to perform all household  chores without limitation by rib pain    Baseline  met/able    Time  4    Period  Weeks    Status  Achieved  PT LONG TERM GOAL #4   Title  able to perform work activities without limitation by rib pain    Time  4    Period  Weeks    Status  Achieved      PT LONG TERM GOAL #5   Title  maintain current trunk ROM with no report of pain or limitations for QOL    Baseline  met    Time  6    Period  Weeks    Status  Achieved            Plan - 12/29/19 1143    Clinical Impression Statement  Pt. has progressed well with recent therapy with therapy goals met, minimal current/recent symptoms so expect can continue progress independently with HEP. Recommend MD follow up with any future changes in status.    Personal Factors and Comorbidities  Comorbidity 1    Comorbidities  h/o cervical pain & LBP    Examination-Activity Limitations  Bend;Sit;Carry;Sleep;Dressing;Stand;Lift;Reach Overhead    Stability/Clinical Decision Making  Stable/Uncomplicated    PT Frequency  2x / week    PT Duration  4 weeks    PT Treatment/Interventions  ADLs/Self Care Home Management;Cryotherapy;Electrical Stimulation;Moist Heat;Iontophoresis 15m/ml Dexamethasone;Therapeutic activities;Therapeutic exercise;Neuromuscular re-education;Manual techniques;Patient/family education;Passive range of motion;Dry needling;Joint Manipulations;Spinal Manipulations;Taping    PT Next Visit Plan  NA    PT Home Exercise Plan  see chart copy    Consulted and Agree with Plan of Care  Patient       Patient will benefit from skilled therapeutic intervention in order to improve the following deficits and impairments:  Increased muscle spasms, Pain, Decreased mobility  Visit Diagnosis: Muscle spasm of back     Problem List Patient Active Problem List   Diagnosis Date Noted  . Lumbar strain, initial encounter 07/28/2019  . Acute neck pain 07/02/2019  . Right ankle injury, subsequent encounter 01/11/2018  . Plantar  wart 04/02/2014  . Peroneal tendinitis of right lower extremity 03/12/2014  . Left wrist pain 07/30/2013  . Left hip pain 07/30/2013  . Atypical chest pain 03/05/2013  . Diverticulosis 07/27/2012  . Family history of osteoporosis 12/28/2011  . Right foot pain 08/09/2011  . Sinusitis, chronic   . Colon polyp   . Menopause   . Migraine   . TRANSAMINASES, SERUM, ELEVATED 12/07/2009  . LUNG NODULE 11/19/2009  . Nonspecific (abnormal) findings on radiological and other examination of body structure 11/19/2009  . NPlayitaLUNG FIELD 11/19/2009  . RHINITIS 06/21/2009  . Acute sinusitis, unspecified 05/20/2009  . HIP PAIN, RIGHT 04/12/2009  . NEUTROPENIA UNSPECIFIED 10/07/2008  . HYPERLIPIDEMIA 08/25/2008  . DEPRESSION 08/25/2008       PHYSICAL THERAPY DISCHARGE SUMMARY  Visits from Start of Care: 11  Current functional level related to goals / functional outcomes:  see above Remaining deficits: NA   Education / Equipment: HEP, issued blue Theraband Plan: Patient agrees to discharge.  Patient goals were met. Patient is being discharged due to meeting the stated rehab goals.  ?????         CBeaulah Dinning PT, DPT 12/29/19 11:46 AM        [facility] [address]Phone: [phone]  Fax:  [phone] Name: Kara TROUPEMRN: [id number] of Birth: 707/27/1957

## 2020-01-01 ENCOUNTER — Ambulatory Visit: Payer: 59 | Admitting: Physical Therapy

## 2020-01-15 ENCOUNTER — Ambulatory Visit: Payer: 59 | Admitting: Rheumatology

## 2020-01-23 MED FILL — OMEPRAZOLE 40 MG CPDR: 40 | 30 days supply | Qty: 30 | Fill #2

## 2020-02-17 DIAGNOSIS — M549 Dorsalgia, unspecified: Secondary | ICD-10-CM | POA: Diagnosis not present

## 2020-02-17 DIAGNOSIS — F411 Generalized anxiety disorder: Secondary | ICD-10-CM | POA: Diagnosis not present

## 2020-02-17 DIAGNOSIS — K219 Gastro-esophageal reflux disease without esophagitis: Secondary | ICD-10-CM | POA: Diagnosis not present

## 2020-02-18 ENCOUNTER — Other Ambulatory Visit: Payer: Self-pay

## 2020-02-18 ENCOUNTER — Ambulatory Visit: Payer: 59 | Admitting: Family Medicine

## 2020-02-18 ENCOUNTER — Encounter: Payer: Self-pay | Admitting: Family Medicine

## 2020-02-18 VITALS — BP 130/66 | Ht 64.0 in | Wt 140.0 lb

## 2020-02-18 DIAGNOSIS — M25551 Pain in right hip: Secondary | ICD-10-CM

## 2020-02-18 NOTE — Progress Notes (Signed)
PCP: Fanny Bien, MD  Subjective:   HPI: Patient is a 64 y.o. female here for right hip pain.  Patient denies known injury or trauma. She had a deep tissue massage on Saturday including massaging area of current pain but no obvious pain that day. Then Sunday she woke up with low back soreness and pain from right glute to anterior hip. Unable to sleep on right side. Tried ice/heat, lidocaine patches. Feels when she pushes on pedals in car. No numbness, tingling.  Past Medical History:  Diagnosis Date  . Abdominal pain, left upper quadrant   . Acute bronchospasm   . Acute neck pain 07/02/2019  . Acute sinusitis, unspecified 05/20/2009   Centricity Description: SINUSITIS - ACUTE-NOS Qualifier: Diagnosis of  By: Birdie Riddle MD, Belenda Cruise   Centricity Description: ACUTE SINUSITIS, UNSPECIFIED Qualifier: Diagnosis of  By: Alveta Heimlich MD, Cornelia Copa   Centricity Description: SINUSITIS, ACUTE Qualifier: Diagnosis of  By: Koleen Nimrod MD, Dellis Filbert    . Atypical chest pain 03/05/2013  . Body mass index (BMI) of 23.0-23.9 in adult   . Cervicalgia   . Colon polyp   . Costochondral junction syndrome   . Cough   . DEPRESSION 08/25/2008   Qualifier: Diagnosis of  By: Ronnald Ramp CMA, Chemira    . Diarrhea   . Diverticulosis 07/27/2012  . Dorsalgia   . Elevated blood pressure reading without diagnosis of hypertension   . Endometrial polyp 8/08   benign  . Family history of osteoporosis 12/28/2011   Mother pt had normal Dexa 11/2009   . Fatigue   . GERD (gastroesophageal reflux disease)   . HIP PAIN, RIGHT 04/12/2009   Qualifier: Diagnosis of  By: Oneida Alar MD, KARL    . History of hiatal hernia   . HYPERLIPIDEMIA 08/25/2008   Qualifier: Diagnosis of  By: Birdie Riddle MD, Belenda Cruise    . IBS (irritable bowel syndrome)   . Left hip pain 07/30/2013  . Left wrist pain 07/30/2013  . Lumbar strain, initial encounter 07/28/2019  . LUNG NODULE 11/19/2009   Neg CT    . 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 Menopause   . Migraine   . NEUTROPENIA UNSPECIFIED 10/07/2008   Qualifier: Diagnosis of  By: Birdie Riddle MD, Belenda Cruise    . NONSPCIFC ABN FINDING RAD & OTH EXAM LUNG FIELD 11/19/2009   Qualifier: Diagnosis of  By: Alveta Heimlich MD, Cornelia Copa    . Nonspecific (abnormal) findings on radiological and other examination of body structure 11/19/2009   Qualifier: Diagnosis of By: Alveta Heimlich MD, Bennie Dallas list entry automatically replaced. Please review for accuracy.  . Otalgia, unspecified ear   . Pain in throat   . Palpitations   . Peroneal tendinitis of right lower extremity 03/12/2014  . Plantar wart 04/02/2014  . Pneumonia   . RHINITIS 06/21/2009   Qualifier: Diagnosis of  By: Birdie Riddle MD, Belenda Cruise    . Right ankle injury, subsequent encounter 01/11/2018  . Right foot pain 08/09/2011  . Sinusitis, chronic   . Skene's gland abscess 2009   patient unaware  . Sprain of unspecified ligament of right ankle, subsequent encounter   . TRANSAMINASES, SERUM, ELEVATED 12/07/2009   Qualifier: Diagnosis of  By: Inda Castle FNP, Wellington Hampshire   . Unspecified cataract   . Urinary tract infection, site not specified   . Vitamin D deficiency     Current Outpatient Medications on File Prior to Visit  Medication Sig Dispense Refill  . azelastine (ASTELIN) 0.1 % nasal spray Place  1 spray into both nostrils as needed.     . Boswellia-Glucosamine-Vit D (OSTEO BI-FLEX ONE PER DAY PO) Take 1 tablet by mouth daily.    . citalopram (CELEXA) 20 MG tablet TAKE 1 TABLET BY MOUTH ONCE DAILY 90 tablet 3  . diltiazem (CARDIZEM) 30 MG tablet Take 1 Tablet Every 4 Hours As Needed For HR >100 45 tablet 3  . famotidine (PEPCID) 20 MG tablet Take 20 mg by mouth as needed for heartburn or indigestion.    . metoprolol succinate (TOPROL-XL) 25 MG 24 hr tablet Take 1 tablet (25 mg total) by mouth daily. 90 tablet 3  . montelukast (SINGULAIR) 10 MG tablet Take 10 mg by mouth as needed.     . Multiple Vitamins-Minerals (MULTI FOR HER 50+) TABS Take 1  tablet by mouth daily.      . Omega-3 Fatty Acids (FISH OIL PO) Take 1 capsule by mouth daily.    Marland Kitchen omeprazole (PRILOSEC) 40 MG capsule Take by mouth daily.     . Probiotic Product (PROBIOTIC PO) Take 1 capsule by mouth daily. Called ultra spectrum    . vitamin E 400 UNIT capsule Take 400 Units by mouth daily.    . Zinc 50 MG TABS Take by mouth daily.      No current facility-administered medications on file prior to visit.    Past Surgical History:  Procedure Laterality Date  . COLONOSCOPY    . DILATION AND CURETTAGE OF UTERUS  8/08  . GYNECOLOGIC CRYOSURGERY  1980  . HYSTEROSCOPY WITH D & C N/A 03/30/2017   Procedure: DILATATION & CURETTAGE/HYSTEROSCOPY WITH ULTRASOUND GUIDANCE;  Surgeon: Megan Salon, MD;  Location: [facility];  Service: Gynecology;  Laterality: N/A;  . UPPER GI ENDOSCOPY    . uterine ablation  2007    No Known Allergies  Social History   Socioeconomic History  . Marital status: Single    Spouse name: Not on file  . Number of children: 0  . Years of education: Not on file  . Highest education level: Not on file  Occupational History  . Occupation: Programmer, multimedia: [employer]  Tobacco Use  . Smoking status: Former Smoker    Packs/day: 0.10    Years: 3.00    Pack years: 0.30    Types: Cigarettes    Quit date: 10/02/1978    Years since quitting: 41.4  . Smokeless tobacco: Never Used  Substance and Sexual Activity  . Alcohol use: Yes    Alcohol/week: 7.0 standard drinks    Types: 7 Glasses of wine per week    Comment: per week  . Drug use: No  . Sexual activity: Not Currently    Birth control/protection: Post-menopausal  Other Topics Concern  . Not on file  Social History Narrative  . Not on file   Social Determinants of Health   Financial Resource Strain:   . Difficulty of Paying Living Expenses:   Food Insecurity:   . Worried About Charity fundraiser in the Last Year:   . Arboriculturist in the Last Year:   Transportation Needs:   . Consulting civil engineer (Medical):   Marland Kitchen Lack of Transportation (Non-Medical):   Physical Activity:   . Days of Exercise per Week:   . Minutes of Exercise per Session:   Stress:   . Feeling of Stress :   Social Connections:   . Frequency of Communication with Friends and Family:   . Frequency  of Social Gatherings with Friends and Family:   . Attends Religious Services:   . Active Member of Clubs or Organizations:   . Attends Archivist Meetings:   Marland Kitchen Marital Status:   Intimate Partner Violence:   . Fear of Current or Ex-Partner:   . Emotionally Abused:   Marland Kitchen Physically Abused:   . Sexually Abused:     Family History  Problem Relation Age of Onset  . Colon polyps Father   . Ulcerative colitis Father   . CAD Father        Stent placement at age 48  . Glaucoma Father   . Hypertension Father   . Colitis Father   . Breast cancer Paternal Grandmother   . Colon cancer Maternal Grandfather   . Colon polyps Mother   . Osteoporosis Mother   . Thyroid disease Mother        hypo  . Glaucoma Mother   . Diverticulosis Mother   . Sudden death Neg Hx   . Hyperlipidemia Neg Hx   . Heart attack Neg Hx   . Diabetes Neg Hx     BP 130/66   Ht 5\' 4"  (1.626 m)   Wt 140 lb (63.5 kg)   LMP 10/02/2006 (Approximate)   BMI 24.03 kg/m   Review of Systems: See HPI above.     Objective:  Physical Exam:  Gen: NAD, comfortable in exam room  Back: No gross deformity, scoliosis. No paraspinal TTP .  No midline or bony TTP. FROM with mild pain with left side bend felt within right glutes. Strength LEs 5/5 all muscle groups.   Negative SLRs. Sensation intact to light touch bilaterally.  Right hip: No deformity. FROM with 5/5 strength.  Mild pain hip abduction, hip flexion. TTP superior aspect of glute medius, maximus, hip flexor. NVI distally. Negative logroll bilateral hips Negative fabers and piriformis stretches.   Assessment & Plan:  1. Right hip pain - reassuring exam.   Consistent with spasms/strain of hip flexor and gluteal musculature.  No red flags.  Tylenol, home stretches daily.  Ice or heat.  Ibuprofen or aleve.  Expect resolution in next 1-2 weeks.  F/u prn.

## 2020-02-18 NOTE — Patient Instructions (Signed)
Your exam is reassuring. You have strains/spasms of your hip flexor and gluteus muscles. I expect these to resolve over 1-2 weeks. Tylenol as needed. Continue with the lidocaine patches. Consider ibuprofen 600mg  three times a day with food OR aleve 2 tabs twice a day with food for pain and inflammation. Ice or heat 15 minutes at a time 3-4 times a day as needed. Let me know in a couple weeks if you're struggling otherwise follow up as needed.

## 2020-02-26 MED FILL — OMEPRAZOLE 40 MG CPDR: 40 | 30 days supply | Qty: 30 | Fill #3

## 2020-03-18 MED FILL — AZELASTINE HCL 137 MCG SPRY: 0.1 | 75 days supply | Qty: 90 | Fill #1

## 2020-03-24 DIAGNOSIS — Z23 Encounter for immunization: Secondary | ICD-10-CM | POA: Diagnosis not present

## 2020-03-30 DIAGNOSIS — T887XXA Unspecified adverse effect of drug or medicament, initial encounter: Secondary | ICD-10-CM | POA: Diagnosis not present

## 2020-03-30 DIAGNOSIS — R001 Bradycardia, unspecified: Secondary | ICD-10-CM | POA: Diagnosis not present

## 2020-03-30 DIAGNOSIS — I4891 Unspecified atrial fibrillation: Secondary | ICD-10-CM | POA: Diagnosis not present

## 2020-03-31 ENCOUNTER — Other Ambulatory Visit: Payer: Self-pay

## 2020-03-31 ENCOUNTER — Encounter: Payer: Self-pay | Admitting: Cardiovascular Disease

## 2020-03-31 ENCOUNTER — Ambulatory Visit: Payer: 59 | Admitting: Cardiovascular Disease

## 2020-03-31 VITALS — BP 122/60 | HR 60 | Ht 64.0 in | Wt 146.6 lb

## 2020-03-31 DIAGNOSIS — I480 Paroxysmal atrial fibrillation: Secondary | ICD-10-CM | POA: Diagnosis not present

## 2020-03-31 MED ORDER — ASPIRIN EC 81 MG PO TBEC: 81.0000 mg | DELAYED_RELEASE_TABLET | Freq: Every day | ORAL | 3 refills | Status: DC

## 2020-03-31 MED FILL — MONTELUKAST SOD 10 MG TAB: 10 | 90 days supply | Qty: 90 | Fill #1

## 2020-03-31 MED FILL — METOPROLOL SUCCINATE ER 25: 25 | 90 days supply | Qty: 90 | Fill #2

## 2020-03-31 NOTE — Patient Instructions (Signed)
Medication Instructions:  Your physician has recommended you make the following change in your medication:  1.) start aspirin 81 mg (enteric coated) - one tablet once daily  *If you need a refill on your cardiac medications before your next appointment, please call your pharmacy*   Lab Work: none If you have labs (blood work) drawn today and your tests are completely normal, you will receive your results only by: Marland Kitchen MyChart Message (if you have MyChart) OR . A paper copy in the mail If you have any lab test that is abnormal or we need to change your treatment, we will call you to review the results.   Testing/Procedures: none   Follow-Up: At [facility], you and your health needs are our priority.  As part of our continuing mission to provide you with exceptional heart care, we have created designated Provider Care Teams.  These Care Teams include your primary Cardiologist (physician) and Advanced Practice Providers (APPs -  Physician Assistants and Nurse Practitioners) who all work together to provide you with the care you need, when you need it.  Your next appointment:   12 month(s)  The format for your next appointment:   In Person  Provider:   You may see Lauree Chandler, MD or one of the following Advanced Practice Providers on your designated Care Team:    Melina Copa, PA-C  Ermalinda Barrios, PA-C    Other Instructions

## 2020-03-31 NOTE — Progress Notes (Signed)
Chief Complaint  Patient presents with  . Follow-up    PAF   History of Present Illness: 64 yo female with history of paroxysmal atrial fibrillation, depression, irritable bowel syndrome, hiatal hernia, GERD, hyperlipidemia and migraine headaches who is here today for cardiac follow up. She was seen as a new patient 08/20/19 with c/o palpitations. She was seen in the [facility] ED in May 2014 with c/o constant left sided chest pain. Troponin negative x 2. EKG without ischemic changes. I saw her in the office in 2014 after her ED visit. . Her pain was felt to be non-cardiac. Echo June 2014 with LVEF=55-60%, no valve disease. Exercise stress test 2014 with no ischemic EKG changes. With most recent palpitations, I arranged an echo and a cardiac monitor. Echo 09/04/19 showed QIHK=74-25%, grade 1 diastolic dysfunction. There was no significant valve disease. Cardiac monitor with atrial fibrillation, SVT and one 7 beat run of VT. She was started on Eliquis in January 2021 (CHADS VASC score of 2). She was seen in the atrial fibrillation clinic in February 2021 and Eliquis was stopped.   She is here today for follow up. The patient denies any dyspnea, palpitations, lower extremity edema, orthopnea, PND, dizziness, near syncope or syncope. Her chest wall was sore yesterday. It felt like a muscle spasm. No associated dyspnea, n/v.     Primary Care Physician: Fanny Bien, MD   Past Medical History:  Diagnosis Date  . Abdominal pain, left upper quadrant   . Acute bronchospasm   . Acute neck pain 07/02/2019  . Acute sinusitis, unspecified 05/20/2009   Centricity Description: SINUSITIS - ACUTE-NOS Qualifier: Diagnosis of  By: Birdie Riddle MD, Belenda Cruise   Centricity Description: ACUTE SINUSITIS, UNSPECIFIED Qualifier: Diagnosis of  By: Alveta Heimlich MD, Cornelia Copa   Centricity Description: SINUSITIS, ACUTE Qualifier: Diagnosis of  By: Koleen Nimrod MD, Dellis Filbert    . Atypical chest pain 03/05/2013  . Body mass index (BMI) of 23.0-23.9  in adult   . Cervicalgia   . Colon polyp   . Costochondral junction syndrome   . Cough   . DEPRESSION 08/25/2008   Qualifier: Diagnosis of  By: Ronnald Ramp CMA, Chemira    . Diarrhea   . Diverticulosis 07/27/2012  . Dorsalgia   . Elevated blood pressure reading without diagnosis of hypertension   . Endometrial polyp 8/08   benign  . Family history of osteoporosis 12/28/2011   Mother pt had normal Dexa 11/2009   . Fatigue   . GERD (gastroesophageal reflux disease)   . HIP PAIN, RIGHT 04/12/2009   Qualifier: Diagnosis of  By: Oneida Alar MD, KARL    . History of hiatal hernia   . HYPERLIPIDEMIA 08/25/2008   Qualifier: Diagnosis of  By: Birdie Riddle MD, Belenda Cruise    . IBS (irritable bowel syndrome)   . Left hip pain 07/30/2013  . Left wrist pain 07/30/2013  . Lumbar strain, initial encounter 07/28/2019  . LUNG NODULE 11/19/2009   Neg CT    . 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 Menopause   . Migraine   . NEUTROPENIA UNSPECIFIED 10/07/2008   Qualifier: Diagnosis of  By: Birdie Riddle MD, Belenda Cruise    . NONSPCIFC ABN FINDING RAD & OTH EXAM LUNG FIELD 11/19/2009   Qualifier: Diagnosis of  By: Alveta Heimlich MD, Cornelia Copa    . Nonspecific (abnormal) findings on radiological and other examination of body structure 11/19/2009   Qualifier: Diagnosis of By: Alveta Heimlich MD, Bennie Dallas list entry automatically replaced. Please review for  accuracy.  . Otalgia, unspecified ear   . Pain in throat   . Palpitations   . Paroxysmal atrial fibrillation (HCC)   . Peroneal tendinitis of right lower extremity 03/12/2014  . Plantar wart 04/02/2014  . Pneumonia   . RHINITIS 06/21/2009   Qualifier: Diagnosis of  By: Birdie Riddle MD, Belenda Cruise    . Right ankle injury, subsequent encounter 01/11/2018  . Right foot pain 08/09/2011  . Sinusitis, chronic   . Skene's gland abscess 2009   patient unaware  . Sprain of unspecified ligament of right ankle, subsequent encounter   . TRANSAMINASES, SERUM, ELEVATED 12/07/2009    Qualifier: Diagnosis of  By: Inda Castle FNP, Wellington Hampshire   . Unspecified cataract   . Urinary tract infection, site not specified   . Vitamin D deficiency     Past Surgical History:  Procedure Laterality Date  . COLONOSCOPY    . DILATION AND CURETTAGE OF UTERUS  8/08  . GYNECOLOGIC CRYOSURGERY  1980  . HYSTEROSCOPY WITH D & C N/A 03/30/2017   Procedure: DILATATION & CURETTAGE/HYSTEROSCOPY WITH ULTRASOUND GUIDANCE;  Surgeon: Megan Salon, MD;  Location: [facility];  Service: Gynecology;  Laterality: N/A;  . UPPER GI ENDOSCOPY    . uterine ablation  2007    Current Outpatient Medications  Medication Sig Dispense Refill  . azelastine (ASTELIN) 0.1 % nasal spray Place 1 spray into both nostrils as needed.     . [place]-Glucosamine-Vit D (OSTEO BI-FLEX ONE PER DAY PO) Take 1 tablet by mouth daily.    . citalopram (CELEXA) 20 MG tablet TAKE 1 TABLET BY MOUTH ONCE DAILY 90 tablet 3  . diltiazem (CARDIZEM) 30 MG tablet Take 1 Tablet Every 4 Hours As Needed For HR >100 45 tablet 3  . famotidine (PEPCID) 20 MG tablet Take 20 mg by mouth as needed for heartburn or indigestion.    . metoprolol succinate (TOPROL-XL) 25 MG 24 hr tablet Take 1 tablet (25 mg total) by mouth daily. 90 tablet 3  . montelukast (SINGULAIR) 10 MG tablet Take 10 mg by mouth as needed.     . Multiple Vitamins-Minerals (MULTI FOR HER 50+) TABS Take 1 tablet by mouth daily.      . Omega-3 Fatty Acids (FISH OIL PO) Take 1 capsule by mouth daily.    Marland Kitchen omeprazole (PRILOSEC) 40 MG capsule Take by mouth daily.     . Probiotic Product (PROBIOTIC PO) Take 1 capsule by mouth daily. Called ultra spectrum    . aspirin EC 81 MG tablet Take 1 tablet (81 mg total) by mouth daily. Swallow whole. 90 tablet 3   No current facility-administered medications for this visit.    No Known Allergies  Social History   Socioeconomic History  . Marital status: Single    Spouse name: Not on file  . Number of children: 0  . Years of education:  Not on file  . Highest education level: Not on file  Occupational History  . Occupation: Programmer, multimedia: [employer]  Tobacco Use  . Smoking status: Former Smoker    Packs/day: 0.10    Years: 3.00    Pack years: 0.30    Types: Cigarettes    Quit date: 10/02/1978    Years since quitting: 41.5  . Smokeless tobacco: Never Used  Vaping Use  . Vaping Use: Never used  Substance and Sexual Activity  . Alcohol use: Yes    Alcohol/week: 7.0 standard drinks    Types: 7 Glasses of  wine per week    Comment: per week  . Drug use: No  . Sexual activity: Not Currently    Birth control/protection: Post-menopausal  Other Topics Concern  . Not on file  Social History Narrative  . Not on file   Social Determinants of Health   Financial Resource Strain:   . Difficulty of Paying Living Expenses:   Food Insecurity:   . Worried About Charity fundraiser in the Last Year:   . Arboriculturist in the Last Year:   Transportation Needs:   . Film/video editor (Medical):   Marland Kitchen Lack of Transportation (Non-Medical):   Physical Activity:   . Days of Exercise per Week:   . Minutes of Exercise per Session:   Stress:   . Feeling of Stress :   Social Connections:   . Frequency of Communication with Friends and Family:   . Frequency of Social Gatherings with Friends and Family:   . Attends Religious Services:   . Active Member of Clubs or Organizations:   . Attends Archivist Meetings:   Marland Kitchen Marital Status:   Intimate Partner Violence:   . Fear of Current or Ex-Partner:   . Emotionally Abused:   Marland Kitchen Physically Abused:   . Sexually Abused:     Family History  Problem Relation Age of Onset  . Colon polyps Father   . Ulcerative colitis Father   . CAD Father        Stent placement at age 54  . Glaucoma Father   . Hypertension Father   . Colitis Father   . Breast cancer Paternal Grandmother   . Colon cancer Maternal Grandfather   . Colon polyps Mother   . Osteoporosis Mother   .  Thyroid disease Mother        hypo  . Glaucoma Mother   . Diverticulosis Mother   . Sudden death Neg Hx   . Hyperlipidemia Neg Hx   . Heart attack Neg Hx   . Diabetes Neg Hx     Review of Systems:  As stated in the HPI and otherwise negative.   BP 122/60   Pulse 60   Ht 5\' 4"  (1.626 m)   Wt 146 lb 9.6 oz (66.5 kg)   LMP 10/02/2006 (Approximate)   SpO2 98%   BMI 25.16 kg/m   Physical Examination:  General: Well developed, well nourished, NAD  HEENT: OP clear, mucus membranes moist  SKIN: warm, dry. No rashes. Neuro: No focal deficits  Musculoskeletal: Muscle strength 5/5 all ext  Psychiatric: Mood and affect normal  Neck: No JVD, no carotid bruits, no thyromegaly, no lymphadenopathy.  Lungs:Clear bilaterally, no wheezes, rhonci, crackles Cardiovascular: Regular rate and rhythm. No murmurs, gallops or rubs. Abdomen:Soft. Bowel sounds present. Non-tender.  Extremities: No lower extremity edema. Pulses are 2 + in the bilateral DP/PT.  EKG:  EKG is not ordered today. The ekg ordered today demonstrates   Recent Labs: 11/25/2019: BUN 14; Creatinine, Ser 0.85; Hemoglobin 13.5; Platelets 311; Potassium 4.3; Sodium 137   Lipid Panel    Component Value Date/Time   CHOL 205 (H) 03/16/2014 1152   TRIG 108 03/16/2014 1152   HDL 103 03/16/2014 1152   CHOLHDL 2.0 03/16/2014 1152   VLDL 22 03/16/2014 1152   LDLCALC 80 03/16/2014 1152     Wt Readings from Last 3 Encounters:  03/31/20 146 lb 9.6 oz (66.5 kg)  02/18/20 140 lb (63.5 kg)  11/27/19 148 lb 9.6 oz (67.4  kg)     Assessment and Plan:   1. Paroxysmal atrial fibrillation: Sinus on exam today. No palpitations. Will continue Toprol. CHADS VASC score of 1-2 (female sex, will be 2 next month). I reviewed this with our EP team. Even with CHADS VASC score of 2, the new guidelines state that females do not have to be on Eliquis. Will start ASA 81 mg daily. Will resume Eliquis if any further episodes of atrial fibrillation.    Current medicines are reviewed at length with the patient today.  The patient does not have concerns regarding medicines.  The following changes have been made:  no change  Labs/ tests ordered today include:   No orders of the defined types were placed in this encounter.    Disposition:   FU with me in 12 months.    Signed, Lauree Chandler, MD 03/31/2020 10:51 AM    [facility] [address], [place], [place]  [address] Phone: [phone]; Fax: [phone]

## 2020-04-08 DIAGNOSIS — K219 Gastro-esophageal reflux disease without esophagitis: Secondary | ICD-10-CM | POA: Diagnosis not present

## 2020-04-08 DIAGNOSIS — Z8379 Family history of other diseases of the digestive system: Secondary | ICD-10-CM | POA: Diagnosis not present

## 2020-04-08 DIAGNOSIS — Z1211 Encounter for screening for malignant neoplasm of colon: Secondary | ICD-10-CM | POA: Diagnosis not present

## 2020-04-08 DIAGNOSIS — K5730 Diverticulosis of large intestine without perforation or abscess without bleeding: Secondary | ICD-10-CM | POA: Diagnosis not present

## 2020-04-12 MED FILL — FAMOTIDINE 20 MG TABS: 20 | 45 days supply | Qty: 90 | Fill #0

## 2020-04-12 MED FILL — CITALOPRAM HBR 20 MG TABLET: 20 | 90 days supply | Qty: 90 | Fill #1

## 2020-05-04 DIAGNOSIS — E782 Mixed hyperlipidemia: Secondary | ICD-10-CM | POA: Diagnosis not present

## 2020-05-05 ENCOUNTER — Other Ambulatory Visit (HOSPITAL_BASED_OUTPATIENT_CLINIC_OR_DEPARTMENT_OTHER): Payer: Self-pay | Admitting: Family Medicine

## 2020-05-05 ENCOUNTER — Other Ambulatory Visit: Payer: Self-pay | Admitting: Family Medicine

## 2020-05-05 ENCOUNTER — Other Ambulatory Visit (HOSPITAL_COMMUNITY): Payer: Self-pay | Admitting: Family Medicine

## 2020-05-05 DIAGNOSIS — B029 Zoster without complications: Secondary | ICD-10-CM | POA: Diagnosis not present

## 2020-05-05 DIAGNOSIS — I4891 Unspecified atrial fibrillation: Secondary | ICD-10-CM | POA: Diagnosis not present

## 2020-05-05 DIAGNOSIS — Z1231 Encounter for screening mammogram for malignant neoplasm of breast: Secondary | ICD-10-CM

## 2020-05-05 DIAGNOSIS — K219 Gastro-esophageal reflux disease without esophagitis: Secondary | ICD-10-CM | POA: Diagnosis not present

## 2020-05-05 DIAGNOSIS — K449 Diaphragmatic hernia without obstruction or gangrene: Secondary | ICD-10-CM

## 2020-05-05 DIAGNOSIS — E1169 Type 2 diabetes mellitus with other specified complication: Secondary | ICD-10-CM | POA: Diagnosis not present

## 2020-05-05 DIAGNOSIS — E782 Mixed hyperlipidemia: Secondary | ICD-10-CM | POA: Diagnosis not present

## 2020-05-05 MED FILL — VALACYCLOVIR HCL 500 MG TAB: 500 | 90 days supply | Qty: 90 | Fill #0

## 2020-05-10 ENCOUNTER — Ambulatory Visit (HOSPITAL_BASED_OUTPATIENT_CLINIC_OR_DEPARTMENT_OTHER)
Admission: RE | Admit: 2020-05-10 | Discharge: 2020-05-10 | Disposition: A | Discharge status: Home / Self Care | Payer: 59 | Source: Ambulatory Visit | Attending: Family Medicine | Admitting: Family Medicine

## 2020-05-10 ENCOUNTER — Encounter (HOSPITAL_BASED_OUTPATIENT_CLINIC_OR_DEPARTMENT_OTHER): Payer: Self-pay

## 2020-05-10 ENCOUNTER — Other Ambulatory Visit: Payer: Self-pay

## 2020-05-10 ENCOUNTER — Other Ambulatory Visit: Payer: Self-pay | Admitting: Family Medicine

## 2020-05-10 DIAGNOSIS — Z1231 Encounter for screening mammogram for malignant neoplasm of breast: Secondary | ICD-10-CM

## 2020-05-11 ENCOUNTER — Other Ambulatory Visit: Payer: Self-pay | Admitting: Family Medicine

## 2020-05-11 DIAGNOSIS — K449 Diaphragmatic hernia without obstruction or gangrene: Secondary | ICD-10-CM

## 2020-05-12 ENCOUNTER — Ambulatory Visit
Admission: RE | Admit: 2020-05-12 | Discharge: 2020-05-12 | Disposition: A | Discharge status: Home / Self Care | Payer: 59 | Source: Ambulatory Visit | Attending: Family Medicine | Admitting: Family Medicine

## 2020-05-12 DIAGNOSIS — K449 Diaphragmatic hernia without obstruction or gangrene: Secondary | ICD-10-CM

## 2020-05-12 DIAGNOSIS — R1012 Left upper quadrant pain: Secondary | ICD-10-CM | POA: Diagnosis not present

## 2020-05-19 DIAGNOSIS — Z23 Encounter for immunization: Secondary | ICD-10-CM | POA: Diagnosis not present

## 2020-05-28 DIAGNOSIS — D122 Benign neoplasm of ascending colon: Secondary | ICD-10-CM | POA: Diagnosis not present

## 2020-05-28 DIAGNOSIS — K5730 Diverticulosis of large intestine without perforation or abscess without bleeding: Secondary | ICD-10-CM | POA: Diagnosis not present

## 2020-05-28 DIAGNOSIS — Z1211 Encounter for screening for malignant neoplasm of colon: Secondary | ICD-10-CM | POA: Diagnosis not present

## 2020-05-28 DIAGNOSIS — K635 Polyp of colon: Secondary | ICD-10-CM | POA: Diagnosis not present

## 2020-05-28 DIAGNOSIS — D120 Benign neoplasm of cecum: Secondary | ICD-10-CM | POA: Diagnosis not present

## 2020-05-30 ENCOUNTER — Emergency Department (HOSPITAL_BASED_OUTPATIENT_CLINIC_OR_DEPARTMENT_OTHER)
Admission: EM | Admit: 2020-05-30 | Discharge: 2020-05-31 | Disposition: A | Discharge status: Home / Self Care | Payer: 59 | Attending: Emergency Medicine | Admitting: Emergency Medicine

## 2020-05-30 ENCOUNTER — Encounter (HOSPITAL_BASED_OUTPATIENT_CLINIC_OR_DEPARTMENT_OTHER): Payer: Self-pay | Admitting: Emergency Medicine

## 2020-05-30 ENCOUNTER — Other Ambulatory Visit: Payer: Self-pay

## 2020-05-30 DIAGNOSIS — R1030 Lower abdominal pain, unspecified: Secondary | ICD-10-CM

## 2020-05-30 DIAGNOSIS — R1084 Generalized abdominal pain: Secondary | ICD-10-CM | POA: Insufficient documentation

## 2020-05-30 DIAGNOSIS — K5730 Diverticulosis of large intestine without perforation or abscess without bleeding: Secondary | ICD-10-CM | POA: Diagnosis not present

## 2020-05-30 DIAGNOSIS — Z7982 Long term (current) use of aspirin: Secondary | ICD-10-CM | POA: Diagnosis not present

## 2020-05-30 DIAGNOSIS — Z79899 Other long term (current) drug therapy: Secondary | ICD-10-CM | POA: Diagnosis not present

## 2020-05-30 DIAGNOSIS — Z87891 Personal history of nicotine dependence: Secondary | ICD-10-CM | POA: Diagnosis not present

## 2020-05-30 DIAGNOSIS — I4891 Unspecified atrial fibrillation: Secondary | ICD-10-CM | POA: Insufficient documentation

## 2020-05-30 DIAGNOSIS — R1031 Right lower quadrant pain: Secondary | ICD-10-CM | POA: Diagnosis not present

## 2020-05-30 DIAGNOSIS — R600 Localized edema: Secondary | ICD-10-CM | POA: Diagnosis not present

## 2020-05-30 LAB — URINALYSIS, ROUTINE W REFLEX MICROSCOPIC
Bilirubin Urine: NEGATIVE
Glucose, UA: NEGATIVE mg/dL
Ketones, ur: NEGATIVE mg/dL
Leukocytes,Ua: NEGATIVE
Nitrite: NEGATIVE
Protein, ur: NEGATIVE mg/dL
Specific Gravity, Urine: 1.02 (ref 1.005–1.030)
pH: 6 (ref 5.0–8.0)

## 2020-05-30 LAB — COMPREHENSIVE METABOLIC PANEL
ALT: 18 U/L (ref 0–44)
AST: 17 U/L (ref 15–41)
Albumin: 4.2 g/dL (ref 3.5–5.0)
Alkaline Phosphatase: 52 U/L (ref 38–126)
Anion gap: 8 (ref 5–15)
BUN: 8 mg/dL (ref 8–23)
CO2: 27 mmol/L (ref 22–32)
Calcium: 9.2 mg/dL (ref 8.9–10.3)
Chloride: 99 mmol/L (ref 98–111)
Creatinine, Ser: 0.67 mg/dL (ref 0.44–1.00)
GFR calc Af Amer: >60 mL/min (ref >60)
GFR calc non Af Amer: >60 mL/min (ref >60)
Glucose, Bld: 108 mg/dL — ABNORMAL HIGH (ref 70–99)
Potassium: 3.5 mmol/L (ref 3.5–5.1)
Sodium: 134 mmol/L — ABNORMAL LOW (ref 135–145)
Total Bilirubin: 0.9 mg/dL (ref 0.3–1.2)
Total Protein: 7.4 g/dL (ref 6.5–8.1)

## 2020-05-30 LAB — CBC
HCT: 36.6 % (ref 36.0–46.0)
Hemoglobin: 12.3 g/dL (ref 12.0–15.0)
MCH: 33.4 pg (ref 26.0–34.0)
MCHC: 33.6 g/dL (ref 30.0–36.0)
MCV: 99.5 fL (ref 80.0–100.0)
Platelets: 279 10*3/uL (ref 150–400)
RBC: 3.68 MIL/uL — ABNORMAL LOW (ref 3.87–5.11)
RDW: 12.3 % (ref 11.5–15.5)
WBC: 13.8 10*3/uL — ABNORMAL HIGH (ref 4.0–10.5)
nRBC: 0 % (ref 0.0–0.2)

## 2020-05-30 LAB — LIPASE, BLOOD: Lipase: 27 U/L (ref 11–51)

## 2020-05-30 LAB — URINALYSIS, MICROSCOPIC (REFLEX): Squamous Epithelial / HPF: NONE SEEN (ref 0–5)

## 2020-05-30 MED ORDER — SODIUM CHLORIDE 0.9 % IV BOLUS
1000.0000 mL | Freq: Once | INTRAVENOUS | Status: AC
  Administered 2020-05-30: 1000 mL via INTRAVENOUS

## 2020-05-30 MED ORDER — ONDANSETRON HCL 4 MG/2ML IJ SOLN
4.0000 mg | Freq: Once | INTRAMUSCULAR | Status: AC
  Administered 2020-05-30: 4 mg via INTRAVENOUS
  Filled 2020-05-30: qty 2

## 2020-05-30 MED ORDER — HYDROMORPHONE HCL 1 MG/ML IJ SOLN
0.5000 mg | Freq: Once | INTRAMUSCULAR | Status: AC
  Administered 2020-05-30: 0.5 mg via INTRAVENOUS
  Filled 2020-05-30: qty 1

## 2020-05-30 NOTE — ED Provider Notes (Signed)
[facility] EMERGENCY DEPARTMENT Provider Note   CSN: [id number] Arrival date & time: 05/30/20  1718     History Chief Complaint  Patient presents with  . Abdominal Pain    Kara Warren is a 64 y.o. female.  Patient with h/o PAF on aspirin, diverticulosis --presents to the emergency department for abdominal pain.  Patient had a colonoscopy approximately 60 hours ago by Dr. Collene Mares.  Patient reports that she had 2 large polyps removed in the right colon, one of which she was told was very large.  She did well post procedure until later in the afternoon when she developed right-sided abdominal pain.  Pain was intense at times.  It has been waxing and waning over the weekend and moved to the middle abdomen.  She has had a few small bowel movements without blood.  No nausea, vomiting, or fever.  Temperature 99.9 F on arrival to the ED.  No treatments prior to arrival.  No previous surgical history.  She denies chest pain or shortness of breath.  No urinary symptoms.        Past Medical History:  Diagnosis Date  . Abdominal pain, left upper quadrant   . Acute bronchospasm   . Acute neck pain 07/02/2019  . Acute sinusitis, unspecified 05/20/2009   Centricity Description: SINUSITIS - ACUTE-NOS Qualifier: Diagnosis of  By: Birdie Riddle MD, Belenda Cruise   Centricity Description: ACUTE SINUSITIS, UNSPECIFIED Qualifier: Diagnosis of  By: Alveta Heimlich MD, Cornelia Copa   Centricity Description: SINUSITIS, ACUTE Qualifier: Diagnosis of  By: Koleen Nimrod MD, Dellis Filbert    . Atypical chest pain 03/05/2013  . Body mass index (BMI) of 23.0-23.9 in adult   . Cervicalgia   . Colon polyp   . Costochondral junction syndrome   . Cough   . DEPRESSION 08/25/2008   Qualifier: Diagnosis of  By: Ronnald Ramp CMA, Chemira    . Diarrhea   . Diverticulosis 07/27/2012  . Dorsalgia   . Elevated blood pressure reading without diagnosis of hypertension   . Endometrial polyp 8/08   benign  . Family history of osteoporosis 12/28/2011    Mother pt had normal Dexa 11/2009   . Fatigue   . GERD (gastroesophageal reflux disease)   . HIP PAIN, RIGHT 04/12/2009   Qualifier: Diagnosis of  By: Oneida Alar MD, KARL    . History of hiatal hernia   . HYPERLIPIDEMIA 08/25/2008   Qualifier: Diagnosis of  By: Birdie Riddle MD, Belenda Cruise    . IBS (irritable bowel syndrome)   . Left hip pain 07/30/2013  . Left wrist pain 07/30/2013  . Lumbar strain, initial encounter 07/28/2019  . LUNG NODULE 11/19/2009   Neg CT    . 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 Menopause   . Migraine   . NEUTROPENIA UNSPECIFIED 10/07/2008   Qualifier: Diagnosis of  By: Birdie Riddle MD, Belenda Cruise    . NONSPCIFC ABN FINDING RAD & OTH EXAM LUNG FIELD 11/19/2009   Qualifier: Diagnosis of  By: Alveta Heimlich MD, Cornelia Copa    . Nonspecific (abnormal) findings on radiological and other examination of body structure 11/19/2009   Qualifier: Diagnosis of By: Alveta Heimlich MD, Bennie Dallas list entry automatically replaced. Please review for accuracy.  . Otalgia, unspecified ear   . Pain in throat   . Palpitations   . Paroxysmal atrial fibrillation (HCC)   . Peroneal tendinitis of right lower extremity 03/12/2014  . Plantar wart 04/02/2014  . Pneumonia   . RHINITIS 06/21/2009   Qualifier: Diagnosis of  By: Birdie Riddle MD, Belenda Cruise    . Right ankle injury, subsequent encounter 01/11/2018  . Right foot pain 08/09/2011  . Sinusitis, chronic   . Skene's gland abscess 2009   patient unaware  . Sprain of unspecified ligament of right ankle, subsequent encounter   . TRANSAMINASES, SERUM, ELEVATED 12/07/2009   Qualifier: Diagnosis of  By: Inda Castle FNP, Wellington Hampshire   . Unspecified cataract   . Urinary tract infection, site not specified   . Vitamin D deficiency     Patient Active Problem List   Diagnosis Date Noted  . Lumbar strain, initial encounter 07/28/2019  . Acute neck pain 07/02/2019  . Right ankle injury, subsequent encounter 01/11/2018  . Plantar wart 04/02/2014  . Peroneal  tendinitis of right lower extremity 03/12/2014  . Left wrist pain 07/30/2013  . Left hip pain 07/30/2013  . Atypical chest pain 03/05/2013  . Diverticulosis 07/27/2012  . Family history of osteoporosis 12/28/2011  . Right foot pain 08/09/2011  . Sinusitis, chronic   . Colon polyp   . Menopause   . Migraine   . TRANSAMINASES, SERUM, ELEVATED 12/07/2009  . LUNG NODULE 11/19/2009  . Nonspecific (abnormal) findings on radiological and other examination of body structure 11/19/2009  . [place] LUNG FIELD 11/19/2009  . RHINITIS 06/21/2009  . Acute sinusitis, unspecified 05/20/2009  . HIP PAIN, RIGHT 04/12/2009  . NEUTROPENIA UNSPECIFIED 10/07/2008  . HYPERLIPIDEMIA 08/25/2008  . DEPRESSION 08/25/2008    Past Surgical History:  Procedure Laterality Date  . COLONOSCOPY    . DILATION AND CURETTAGE OF UTERUS  8/08  . GYNECOLOGIC CRYOSURGERY  1980  . HYSTEROSCOPY WITH D & C N/A 03/30/2017   Procedure: DILATATION & CURETTAGE/HYSTEROSCOPY WITH ULTRASOUND GUIDANCE;  Surgeon: Megan Salon, MD;  Location: [facility];  Service: Gynecology;  Laterality: N/A;  . UPPER GI ENDOSCOPY    . uterine ablation  2007     OB History    Gravida  0   Para  0   Term  0   Preterm  0   AB  0   Living  0     SAB  0   TAB  0   Ectopic  0   Multiple  0   Live Births  0           Family History  Problem Relation Age of Onset  . Colon polyps Father   . Ulcerative colitis Father   . CAD Father        Stent placement at age 22  . Glaucoma Father   . Hypertension Father   . Colitis Father   . Breast cancer Paternal Grandmother   . Colon cancer Maternal Grandfather   . Colon polyps Mother   . Osteoporosis Mother   . Thyroid disease Mother        hypo  . Glaucoma Mother   . Diverticulosis Mother   . Sudden death Neg Hx   . Hyperlipidemia Neg Hx   . Heart attack Neg Hx   . Diabetes Neg Hx     Social History   Tobacco Use  . Smoking status: Former  Smoker    Packs/day: 0.10    Years: 3.00    Pack years: 0.30    Types: Cigarettes    Quit date: 10/02/1978    Years since quitting: 41.6  . Smokeless tobacco: Never Used  Vaping Use  . Vaping Use: Never used  Substance Use Topics  .  Alcohol use: Yes    Alcohol/week: 7.0 standard drinks    Types: 7 Glasses of wine per week    Comment: per week  . Drug use: No    Home Medications Prior to Admission medications   Medication Sig Start Date End Date Taking? Authorizing Provider  aspirin EC 81 MG tablet Take 1 tablet (81 mg total) by mouth daily. Swallow whole. 03/31/20   Burnell Blanks, MD  azelastine (ASTELIN) 0.1 % nasal spray Place 1 spray into both nostrils as needed.  02/04/19   [provider]  Boswellia-Glucosamine-Vit D (OSTEO BI-FLEX ONE PER DAY PO) Take 1 tablet by mouth daily.    [provider]  citalopram (CELEXA) 20 MG tablet TAKE 1 TABLET BY MOUTH ONCE DAILY 09/09/14   Schoenhoff, Altamese Cabal, MD  diltiazem (CARDIZEM) 30 MG tablet Take 1 Tablet Every 4 Hours As Needed For HR >100 10/27/19   Sherran Needs, NP  famotidine (PEPCID) 20 MG tablet Take 20 mg by mouth as needed for heartburn or indigestion.    [provider]  metoprolol succinate (TOPROL-XL) 25 MG 24 hr tablet Take 1 tablet (25 mg total) by mouth daily. 10/09/19   Burnell Blanks, MD  montelukast (SINGULAIR) 10 MG tablet Take 10 mg by mouth as needed.  11/06/18   [provider]  Multiple Vitamins-Minerals (MULTI FOR HER 50+) TABS Take 1 tablet by mouth daily.      [provider]  Omega-3 Fatty Acids (FISH OIL PO) Take 1 capsule by mouth daily.    [provider]  omeprazole (PRILOSEC) 40 MG capsule Take by mouth daily.  10/21/19   [provider]  Probiotic Product (PROBIOTIC PO) Take 1 capsule by mouth daily. Called ultra spectrum    [provider]    Allergies    Patient has no known allergies.  Review of Systems   Review of  Systems  Constitutional: Positive for appetite change. Negative for fever.  HENT: Negative for rhinorrhea and sore throat.   Eyes: Negative for redness.  Respiratory: Negative for cough and shortness of breath.   Cardiovascular: Negative for chest pain.  Gastrointestinal: Positive for abdominal distention and abdominal pain. Negative for blood in stool, constipation, diarrhea, nausea and vomiting.  Genitourinary: Negative for dysuria, frequency, hematuria and urgency.  Musculoskeletal: Negative for myalgias.  Skin: Negative for rash.  Neurological: Negative for headaches.    Physical Exam Updated Vital Signs BP 131/72 (BP Location: Right Arm)   Pulse (!) 55   Temp 99.9 F (37.7 C) (Oral)   Resp 13   Ht 5\' 4"  (1.626 m)   Wt 66.7 kg   LMP 10/02/2006 (Approximate)   SpO2 97%   BMI 25.23 kg/m   Physical Exam Vitals and nursing note reviewed.  Constitutional:      General: She is not in acute distress.    Appearance: She is well-developed.  HENT:     Head: Normocephalic and atraumatic.     Right Ear: External ear normal.     Left Ear: External ear normal.     Nose: Nose normal.  Eyes:     Conjunctiva/sclera: Conjunctivae normal.  Cardiovascular:     Rate and Rhythm: Normal rate and regular rhythm.     Heart sounds: No murmur heard.   Pulmonary:     Effort: No respiratory distress.     Breath sounds: No wheezing, rhonchi or rales.  Abdominal:     Palpations: Abdomen is soft.  Tenderness: There is generalized abdominal tenderness. There is no guarding or rebound.     Comments: Patient with mild generalized abdominal tenderness with moderate focal tenderness on the right lateral abdomen and umbilical areas.  Musculoskeletal:     Cervical back: Normal range of motion and neck supple.     Right lower leg: No edema.     Left lower leg: No edema.  Skin:    General: Skin is warm and dry.     Findings: No rash.  Neurological:     General: No focal deficit present.      Mental Status: She is alert. Mental status is at baseline.     Motor: No weakness.  Psychiatric:        Mood and Affect: Mood normal.     ED Results / Procedures / Treatments   Labs (all labs ordered are listed, but only abnormal results are displayed) Labs Reviewed  COMPREHENSIVE METABOLIC PANEL - Abnormal; Notable for the following components:      Result Value   Sodium 134 (*)    Glucose, Bld 108 (*)    All other components within normal limits  CBC - Abnormal; Notable for the following components:   WBC 13.8 (*)    RBC 3.68 (*)    All other components within normal limits  URINALYSIS, ROUTINE W REFLEX MICROSCOPIC - Abnormal; Notable for the following components:   Hgb urine dipstick SMALL (*)    All other components within normal limits  URINALYSIS, MICROSCOPIC (REFLEX) - Abnormal; Notable for the following components:   Bacteria, UA RARE (*)    All other components within normal limits  LIPASE, BLOOD    EKG None  Radiology No results found.  Procedures Procedures (including critical care time)  Medications Ordered in ED Medications  sodium chloride 0.9 % bolus 1,000 mL (1,000 mLs Intravenous New Bag/Given 05/30/20 2314)  HYDROmorphone (DILAUDID) injection 0.5 mg (0.5 mg Intravenous Given 05/30/20 2308)  ondansetron (ZOFRAN) injection 4 mg (4 mg Intravenous Given 05/30/20 2308)    ED Course  I have reviewed the triage vital signs and the nursing notes.  Pertinent labs & imaging results that were available during my care of the patient were reviewed by me and considered in my medical decision making (see chart for details).  Patient seen and examined. Work-up initiated. Medications ordered.  Labs show elevated white blood cell count.  CT of the abdomen and pelvis ordered and pending.  Colonoscopy report unavailable in epic.  Patient discussed with Dr. Gilford Raid.   Vital signs reviewed and are as follows: BP 131/72 (BP Location: Right Arm)   Pulse (!) 55   Temp  99.9 F (37.7 C) (Oral)   Resp 13   Ht 5\' 4"  (1.626 m)   Wt 66.7 kg   LMP 10/02/2006 (Approximate)   SpO2 97%   BMI 25.23 kg/m   11:43 PM Signout to Dr. Dina Rich at shift change who will follow-up on results.     MDM Rules/Calculators/A&P                          Pending completion of work-up to evaluate for large bowel perforation, colitis, other complication from colonoscopy.    Final Clinical Impression(s) / ED Diagnoses Final diagnoses:  Generalized abdominal pain    Rx / DC Orders ED Discharge Orders    None       Carlisle Cater, Hershal Coria 05/30/20 2345    Isla Pence, MD  05/30/20 2352  

## 2020-05-30 NOTE — ED Triage Notes (Signed)
Reports having colonoscopy 48 hrs ago.  Polyps removed.  Reports severe abdominal pain since.  Denies seeing any blood in stool.  Reports being distended and that pain is radiating into center of the belly.  Did have a bm today that was normal.  Denies having any n/v.

## 2020-05-31 ENCOUNTER — Encounter (HOSPITAL_BASED_OUTPATIENT_CLINIC_OR_DEPARTMENT_OTHER): Payer: Self-pay

## 2020-05-31 ENCOUNTER — Emergency Department (HOSPITAL_BASED_OUTPATIENT_CLINIC_OR_DEPARTMENT_OTHER): Payer: 59

## 2020-05-31 DIAGNOSIS — Z79899 Other long term (current) drug therapy: Secondary | ICD-10-CM | POA: Diagnosis not present

## 2020-05-31 DIAGNOSIS — Z7982 Long term (current) use of aspirin: Secondary | ICD-10-CM | POA: Diagnosis not present

## 2020-05-31 DIAGNOSIS — K5730 Diverticulosis of large intestine without perforation or abscess without bleeding: Secondary | ICD-10-CM | POA: Diagnosis not present

## 2020-05-31 DIAGNOSIS — I4891 Unspecified atrial fibrillation: Secondary | ICD-10-CM | POA: Diagnosis not present

## 2020-05-31 DIAGNOSIS — R600 Localized edema: Secondary | ICD-10-CM | POA: Diagnosis not present

## 2020-05-31 DIAGNOSIS — Z87891 Personal history of nicotine dependence: Secondary | ICD-10-CM | POA: Diagnosis not present

## 2020-05-31 DIAGNOSIS — R1084 Generalized abdominal pain: Secondary | ICD-10-CM | POA: Diagnosis not present

## 2020-05-31 MED ORDER — METRONIDAZOLE 500 MG PO TABS
ORAL_TABLET | ORAL | Status: AC
  Filled 2020-05-31: qty 1

## 2020-05-31 MED ORDER — METRONIDAZOLE 500 MG PO TABS: 500.0000 mg | ORAL_TABLET | Freq: Two times a day (BID) | ORAL | 0 refills | Status: DC

## 2020-05-31 MED ORDER — CIPROFLOXACIN HCL 500 MG PO TABS: 500.0000 mg | ORAL_TABLET | Freq: Two times a day (BID) | ORAL | 0 refills | Status: DC

## 2020-05-31 MED ORDER — CIPROFLOXACIN HCL 500 MG PO TABS
500.0000 mg | ORAL_TABLET | Freq: Once | ORAL | Status: AC
  Administered 2020-05-31: 500 mg via ORAL
  Filled 2020-05-31: qty 1

## 2020-05-31 MED ORDER — OXYCODONE-ACETAMINOPHEN 5-325 MG PO TABS
1.0000 | ORAL_TABLET | Freq: Four times a day (QID) | ORAL | 0 refills | Status: DC | PRN
Start: 2020-05-31 — End: 2020-05-31

## 2020-05-31 MED ORDER — METRONIDAZOLE 500 MG PO TABS
500.0000 mg | ORAL_TABLET | Freq: Once | ORAL | Status: AC
  Administered 2020-05-31: 500 mg via ORAL
  Filled 2020-05-31: qty 1

## 2020-05-31 MED ORDER — OXYCODONE-ACETAMINOPHEN 5-325 MG PO TABS: 1.0000 | ORAL_TABLET | Freq: Four times a day (QID) | ORAL | 0 refills | Status: DC | PRN

## 2020-05-31 MED ORDER — IOHEXOL 300 MG/ML  SOLN
100.0000 mL | Freq: Once | INTRAMUSCULAR | Status: AC | PRN
  Administered 2020-05-31: 100 mL via INTRAVENOUS

## 2020-05-31 MED FILL — OXYCODONE-APAP 5-325MG: 5-325 | 2 days supply | Qty: 10 | Fill #0

## 2020-05-31 MED FILL — metroNIDAZOLE 500 MG TABS: 500 | 7 days supply | Qty: 14 | Fill #0

## 2020-05-31 MED FILL — CIPROFLOXACIN HCL 500 MG TA: 500 | 7 days supply | Qty: 14 | Fill #0

## 2020-05-31 NOTE — ED Notes (Signed)
Pt called and said her prescriptions were not at the pharmacy, but that she would like them to be sent to a different pharmacy anyway; EDP Horton made aware

## 2020-05-31 NOTE — ED Notes (Signed)
Pt requesting medications sent to [facility], consulted EDP who reports that change in pharmacy was purposefully made so patient could pick up medicines now (sent to 24 hour pharmacy by Dr. Dina Rich.) Pt concerned that antibiotics may cause GI upset and states she doesn't plan to take flagyl. Educated patients multiple times that these antibiotics are the standard of care and either could cause her to have N/V/D. Pt continues to state that she doesn't want to take her antibiotics as prescribed.

## 2020-05-31 NOTE — Discharge Instructions (Addendum)
You are seen today for abdominal pain.  Follow-up closely with her gastroenterologist.  Your CT scan shows some inflammation or infection of the valve.  You were given an antibiotic given your recent instrumentation.  If you develop any new or worsening symptoms you should be reevaluated.

## 2020-05-31 NOTE — ED Provider Notes (Signed)
Patient received in signout.  In brief had a colonoscopy several days ago with 2 polyps removed.  Has developed lower abdominal pain.  She is overall nontoxic on exam and vital signs are reassuring.  CT scan shows some inflammation of the lower portions of the bowel suggestive of enteritis or other inflammatory pathology.  No evidence of bowel perforation.  Patient is able to tolerate fluids without difficulty.  We discussed given her recent instrumentation, will cover with antibiotics as she has a leukocytosis as well.  She needs to call Dr. Collene Mares first thing in the morning.  After history, exam, and medical workup I feel the patient has been appropriately medically screened and is safe for discharge home. Pertinent diagnoses were discussed with the patient. Patient was given return precautions.  Results for orders placed or performed during the hospital encounter of 05/30/20  Lipase, blood  Result Value Ref Range   Lipase 27 11 - 51 U/L  Comprehensive metabolic panel  Result Value Ref Range   Sodium 134 (L) 135 - 145 mmol/L   Potassium 3.5 3.5 - 5.1 mmol/L   Chloride 99 98 - 111 mmol/L   CO2 27 22 - 32 mmol/L   Glucose, Bld 108 (H) 70 - 99 mg/dL   BUN 8 8 - 23 mg/dL   Creatinine, Ser 0.67 0.44 - 1.00 mg/dL   Calcium 9.2 8.9 - 10.3 mg/dL   Total Protein 7.4 6.5 - 8.1 g/dL   Albumin 4.2 3.5 - 5.0 g/dL   AST 17 15 - 41 U/L   ALT 18 0 - 44 U/L   Alkaline Phosphatase 52 38 - 126 U/L   Total Bilirubin 0.9 0.3 - 1.2 mg/dL   GFR calc non Af Amer >60 >60 mL/min   GFR calc Af Amer >60 >60 mL/min   Anion gap 8 5 - 15  CBC  Result Value Ref Range   WBC 13.8 (H) 4.0 - 10.5 K/uL   RBC 3.68 (L) 3.87 - 5.11 MIL/uL   Hemoglobin 12.3 12.0 - 15.0 g/dL   HCT 36.6 36 - 46 %   MCV 99.5 80.0 - 100.0 fL   MCH 33.4 26.0 - 34.0 pg   MCHC 33.6 30.0 - 36.0 g/dL   RDW 12.3 11.5 - 15.5 %   Platelets 279 150 - 400 K/uL   nRBC 0.0 0.0 - 0.2 %  Urinalysis, Routine w reflex microscopic Urine, Clean Catch   Result Value Ref Range   Color, Urine YELLOW YELLOW   APPearance CLEAR CLEAR   Specific Gravity, Urine 1.020 1.005 - 1.030   pH 6.0 5.0 - 8.0   Glucose, UA NEGATIVE NEGATIVE mg/dL   Hgb urine dipstick SMALL (A) NEGATIVE   Bilirubin Urine NEGATIVE NEGATIVE   Ketones, ur NEGATIVE NEGATIVE mg/dL   Protein, ur NEGATIVE NEGATIVE mg/dL   Nitrite NEGATIVE NEGATIVE   Leukocytes,Ua NEGATIVE NEGATIVE  Urinalysis, Microscopic (reflex)  Result Value Ref Range   RBC / HPF 0-5 0 - 5 RBC/hpf   WBC, UA 0-5 0 - 5 WBC/hpf   Bacteria, UA RARE (A) NONE SEEN   Squamous Epithelial / LPF NONE SEEN 0 - 5   DG Abd 1 View  Result Date: 05/12/2020 CLINICAL DATA:  Left upper quadrant abdominal pain. EXAM: ABDOMEN - 1 VIEW COMPARISON:  None. FINDINGS: The bowel gas pattern is normal. No radio-opaque calculi or other significant radiographic abnormality are seen. IMPRESSION: Negative. Electronically Signed   By: Fidela Salisbury MD   On: 05/12/2020 21:52  CT ABDOMEN PELVIS W CONTRAST  Result Date: 05/31/2020 CLINICAL DATA:  Colonoscopy 48 hours ago with polypectomy, severe abdominal pain EXAM: CT ABDOMEN AND PELVIS WITH CONTRAST TECHNIQUE: Multidetector CT imaging of the abdomen and pelvis was performed using the standard protocol following bolus administration of intravenous contrast. CONTRAST:  148mL OMNIPAQUE IOHEXOL 300 MG/ML  SOLN COMPARISON:  02/21/2017 FINDINGS: Lower chest: Hypoventilatory changes are seen at the lung bases. No acute pleural or parenchymal lung disease. Hepatobiliary: Stable small hypodensity within the right lobe liver measuring 0.9 cm, likely a small cyst or hemangioma. No biliary dilation. The gallbladder is unremarkable. Pancreas: Unremarkable. No pancreatic ductal dilatation or surrounding inflammatory changes. Spleen: Normal in size without focal abnormality. Adrenals/Urinary Tract: Adrenal glands are unremarkable. Kidneys are normal, without renal calculi, focal lesion, or hydronephrosis.  Bladder is unremarkable. Stomach/Bowel: No bowel obstruction or ileus. Normal appendix right lower quadrant. Diverticulosis of the sigmoid colon without diverticulitis. There is mesenteric fat stranding surrounding several loops of decompressed small bowel within the upper pelvis. Evaluation of the bowel in this region is limited without oral contrast. Vascular/Lymphatic: No significant vascular findings are present. No enlarged abdominal or pelvic lymph nodes. Reproductive: Uterus and bilateral adnexa are unremarkable. Other: There is mesenteric edema within the central upper pelvis, surrounding multiple loops of decompressed small bowel. Trace free fluid is seen within the right hemipelvis. No free intraperitoneal gas. No abdominal wall hernia. Musculoskeletal: No acute or destructive bony lesions. Reconstructed images demonstrate no additional findings. IMPRESSION: 1. Nonspecific mesenteric fat stranding within the central upper pelvis, surrounding multiple loops of decompressed small bowel. Findings could reflect sequela of enteritis. 2. Sigmoid diverticulosis without diverticulitis. 3. Normal appendix. 4. Trace pelvic free fluid. Electronically Signed   By: Randa Ngo M.D.   On: 05/31/2020 00:56   MM 3D SCREEN BREAST BILATERAL  Result Date: 05/10/2020 CLINICAL DATA:  Screening. EXAM: DIGITAL SCREENING BILATERAL MAMMOGRAM WITH TOMO AND CAD COMPARISON:  Previous exam(s). ACR Breast Density Category c: The breast tissue is heterogeneously dense, which may obscure small masses. FINDINGS: There are no findings suspicious for malignancy. Images were processed with CAD. IMPRESSION: No mammographic evidence of malignancy. A result letter of this screening mammogram will be mailed directly to the patient. RECOMMENDATION: Screening mammogram in one year. (Code:SM-B-01Y) BI-RADS CATEGORY  1: Negative. Electronically Signed   By: Audie Pinto M.D.   On: 05/10/2020 15:30   DG ESOPHAGUS W DOUBLE CM  (HD)  Result Date: 05/12/2020 CLINICAL DATA:  Left lower chest and upper quadrant pain. EXAM: ESOPHOGRAM / BARIUM SWALLOW / BARIUM TABLET STUDY TECHNIQUE: Combined double contrast and single contrast examination performed using effervescent crystals, thick barium liquid, and thin barium liquid. The patient was observed with fluoroscopy swallowing a 13 mm barium sulphate tablet. FLUOROSCOPY TIME:  Fluoroscopy Time:  1 minutes 12 seconds Radiation Exposure Index (if provided by the fluoroscopic device): 97.9 mGy Number of Acquired Spot Images: 0 COMPARISON:  None. FINDINGS: No evidence of esophageal mass or stricture. No findings of esophagitis noted. Esophageal motility is within normal limits. No evidence of hiatal hernia or gastroesophgeal reflux. An ingested 30mm barium tablet passed freely through the esophagus, and into the stomach. IMPRESSION: Normal study. Electronically Signed   By: Marlaine Hind M.D.   On: 05/12/2020 11:44      Ciani Rutten, Barbette Hair, MD 05/31/20 [phone]

## 2020-06-01 DIAGNOSIS — R140 Abdominal distension (gaseous): Secondary | ICD-10-CM | POA: Diagnosis not present

## 2020-06-01 DIAGNOSIS — Z800 Family history of malignant neoplasm of digestive organs: Secondary | ICD-10-CM | POA: Diagnosis not present

## 2020-06-01 DIAGNOSIS — K5730 Diverticulosis of large intestine without perforation or abscess without bleeding: Secondary | ICD-10-CM | POA: Diagnosis not present

## 2020-06-01 DIAGNOSIS — R1031 Right lower quadrant pain: Secondary | ICD-10-CM | POA: Diagnosis not present

## 2020-06-24 DIAGNOSIS — I10 Essential (primary) hypertension: Secondary | ICD-10-CM | POA: Diagnosis not present

## 2020-06-24 DIAGNOSIS — F411 Generalized anxiety disorder: Secondary | ICD-10-CM | POA: Diagnosis not present

## 2020-06-24 DIAGNOSIS — K219 Gastro-esophageal reflux disease without esophagitis: Secondary | ICD-10-CM | POA: Diagnosis not present

## 2020-06-24 DIAGNOSIS — R1012 Left upper quadrant pain: Secondary | ICD-10-CM | POA: Diagnosis not present

## 2020-06-29 DIAGNOSIS — Z23 Encounter for immunization: Secondary | ICD-10-CM | POA: Diagnosis not present

## 2020-06-29 MED FILL — CITALOPRAM HBR 20 MG TABLET: 20 | 90 days supply | Qty: 90 | Fill #2

## 2020-06-29 MED FILL — FAMOTIDINE 20 MG TABS: 20 | 45 days supply | Qty: 90 | Fill #1

## 2020-06-29 MED FILL — METOPROLOL SUCCINATE ER 25: 25 | 90 days supply | Qty: 90 | Fill #3

## 2020-07-06 ENCOUNTER — Other Ambulatory Visit (HOSPITAL_COMMUNITY): Payer: Self-pay | Admitting: Gastroenterology

## 2020-07-06 MED FILL — CLENPIQ 10-3.5-12 MG-GM -GM: 10-3.5-12 M | 1 days supply | Qty: 320 | Fill #0

## 2020-07-13 DIAGNOSIS — Z23 Encounter for immunization: Secondary | ICD-10-CM | POA: Diagnosis not present

## 2020-08-03 NOTE — Telephone Encounter (Signed)
Will route to PharmD pool to weigh in on Citalopram use and afib/palpitations.

## 2020-08-30 ENCOUNTER — Encounter: Payer: Self-pay | Admitting: Family Medicine

## 2020-08-30 ENCOUNTER — Other Ambulatory Visit: Payer: Self-pay | Admitting: Family Medicine

## 2020-08-30 ENCOUNTER — Ambulatory Visit: Payer: 59 | Admitting: Family Medicine

## 2020-08-30 ENCOUNTER — Other Ambulatory Visit: Payer: Self-pay

## 2020-08-30 ENCOUNTER — Other Ambulatory Visit: Payer: 59

## 2020-08-30 VITALS — BP 118/84 | Ht 65.0 in | Wt 142.0 lb

## 2020-08-30 DIAGNOSIS — R300 Dysuria: Secondary | ICD-10-CM

## 2020-08-30 LAB — POCT URINALYSIS DIP (MANUAL ENTRY)
Bilirubin, UA: NEGATIVE
Blood, UA: NEGATIVE
Glucose, UA: NEGATIVE mg/dL
Ketones, POC UA: NEGATIVE mg/dL
Leukocytes, UA: NEGATIVE
Nitrite, UA: NEGATIVE
Protein Ur, POC: NEGATIVE mg/dL
Spec Grav, UA: 1.025 (ref 1.010–1.025)
Urobilinogen, UA: 0.2 E.U./dL
pH, UA: 5.5 (ref 5.0–8.0)

## 2020-08-30 MED ORDER — MELOXICAM 15 MG PO TABS: 15.0000 mg | ORAL_TABLET | Freq: Every day | ORAL | 1 refills | Status: DC

## 2020-08-30 MED FILL — MELOXICAM 15 MG TABLET: 15 | 30 days supply | Qty: 30 | Fill #0

## 2020-08-30 MED FILL — traMADol HCL 50 MG TABS: 50 | 7 days supply | Qty: 21 | Fill #0

## 2020-08-30 NOTE — Progress Notes (Signed)
PCP: Fanny Bien, MD  Subjective:   HPI: Patient is a 64 y.o. female here for right hip pain.  Patient denies known injury or trauma. She had a deep tissue massage on Saturday including massaging area of current pain but no obvious pain that day. Then Sunday she woke up with low back soreness and pain from right glute to anterior hip. Unable to sleep on right side. Tried ice/heat, lidocaine patches. Feels when she pushes on pedals in car. No numbness, tingling.  11/29 Patient has had right lumbar back pain and left back pain for the past week.  The pain comes and goes.  No focal weakness.  She has taken some Tylenol and use lidocaine patches for the pain. No bowel/bladder dysfunction but some urgency, concern for UTI.  Past Medical History:  Diagnosis Date   Abdominal pain, left upper quadrant    Acute bronchospasm    Acute neck pain 07/02/2019   Acute sinusitis, unspecified 05/20/2009   Centricity Description: SINUSITIS - ACUTE-NOS Qualifier: Diagnosis of  By: Birdie Riddle MD, Belenda Cruise   Centricity Description: ACUTE SINUSITIS, UNSPECIFIED Qualifier: Diagnosis of  By: Alveta Heimlich MD, Cornelia Copa   Centricity Description: SINUSITIS, ACUTE Qualifier: Diagnosis of  By: Koleen Nimrod MD, Dellis Filbert     Atypical chest pain 03/05/2013   Body mass index (BMI) of 23.0-23.9 in adult    Cervicalgia    Colon polyp    Costochondral junction syndrome    Cough    DEPRESSION 08/25/2008   Qualifier: Diagnosis of  By: Ronnald Ramp CMA, Chemira     Diarrhea    Diverticulosis 07/27/2012   Dorsalgia    Elevated blood pressure reading without diagnosis of hypertension    Endometrial polyp 8/08   benign   Family history of osteoporosis 12/28/2011   Mother pt had normal Dexa 11/2009    Fatigue    GERD (gastroesophageal reflux disease)    HIP PAIN, RIGHT 04/12/2009   Qualifier: Diagnosis of  By: Oneida Alar MD, KARL     History of hiatal hernia    HYPERLIPIDEMIA 08/25/2008   Qualifier: Diagnosis of  By:  Birdie Riddle MD, Belenda Cruise     IBS (irritable bowel syndrome)    Left hip pain 07/30/2013   Left wrist pain 07/30/2013   Lumbar strain, initial encounter 07/28/2019   LUNG NODULE 11/19/2009   Neg CT     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 Please review for accuracy.   Otalgia, unspecified ear    Pain in throat    Palpitations    Paroxysmal atrial fibrillation (HCC)    Peroneal tendinitis of right lower extremity 03/12/2014   Plantar wart 04/02/2014   Pneumonia    RHINITIS 06/21/2009   Qualifier: Diagnosis of  By: Birdie Riddle MD, Belenda Cruise     Right ankle injury, subsequent encounter 01/11/2018   Right foot pain 08/09/2011   Sinusitis, chronic    Skene's gland abscess 2009   patient unaware   Sprain of unspecified ligament of right ankle, subsequent encounter    TRANSAMINASES, SERUM, ELEVATED 12/07/2009  Qualifier: Diagnosis of  By: Inda Castle FNP, Melissa S    Unspecified cataract    Urinary tract infection, site not specified    Vitamin D deficiency     Current Outpatient Medications on File Prior to Visit  Medication Sig Dispense Refill   aspirin EC 81 MG tablet Take 1 tablet (81 mg total) by mouth daily. Swallow whole. 90 tablet 3   azelastine (ASTELIN) 0.1 % nasal spray Place 1 spray into both nostrils as needed.      Boswellia-Glucosamine-Vit D (OSTEO BI-FLEX ONE PER DAY PO) Take 1 tablet by mouth daily.     ciprofloxacin (CIPRO) 500 MG tablet Take 1 tablet (500 mg total) by mouth every 12 (twelve) hours. 14 tablet 0    citalopram (CELEXA) 20 MG tablet TAKE 1 TABLET BY MOUTH ONCE DAILY 90 tablet 3   diltiazem (CARDIZEM) 30 MG tablet Take 1 Tablet Every 4 Hours As Needed For HR >100 45 tablet 3   famotidine (PEPCID) 20 MG tablet Take 20 mg by mouth as needed for heartburn or indigestion.     metoprolol succinate (TOPROL-XL) 25 MG 24 hr tablet Take 1 tablet (25 mg total) by mouth daily. 90 tablet 3   metroNIDAZOLE (FLAGYL) 500 MG tablet Take 1 tablet (500 mg total) by mouth 2 (two) times daily. 14 tablet 0   montelukast (SINGULAIR) 10 MG tablet Take 10 mg by mouth as needed.      Multiple Vitamins-Minerals (MULTI FOR HER 50+) TABS Take 1 tablet by mouth daily.       Omega-3 Fatty Acids (FISH OIL PO) Take 1 capsule by mouth daily.     omeprazole (PRILOSEC) 40 MG capsule Take by mouth daily.      oxyCODONE-acetaminophen (PERCOCET/ROXICET) 5-325 MG tablet Take 1 tablet by mouth every 6 (six) hours as needed for severe pain. 10 tablet 0   Probiotic Product (PROBIOTIC PO) Take 1 capsule by mouth daily. Called ultra spectrum     No current facility-administered medications on file prior to visit.    Past Surgical History:  Procedure Laterality Date   COLONOSCOPY     DILATION AND CURETTAGE OF UTERUS  8/08   GYNECOLOGIC CRYOSURGERY  1980   HYSTEROSCOPY WITH D & C N/A 03/30/2017   Procedure: DILATATION & CURETTAGE/HYSTEROSCOPY WITH ULTRASOUND GUIDANCE;  Surgeon: Megan Salon, MD;  Location: [facility];  Service: Gynecology;  Laterality: N/A;   UPPER GI ENDOSCOPY     uterine ablation  2007    No Known Allergies  Social History   Socioeconomic History   Marital status: Single    Spouse name: Not on file   Number of children: 0   Years of education: Not on file   Highest education level: Not on file  Occupational History   Occupation: Therapist, sports    Employer: [employer]  Tobacco Use   Smoking status: Former Smoker    Packs/day: 0.10    Years: 3.00    Pack years: 0.30    Types: Cigarettes     Quit date: 10/02/1978    Years since quitting: 41.9   Smokeless tobacco: Never Used  Vaping Use   Vaping Use: Never used  Substance and Sexual Activity   Alcohol use: Yes    Alcohol/week: 7.0 standard drinks    Types: 7 Glasses of wine per week    Comment: per week   Drug use: No   Sexual activity: Not Currently    Birth control/protection: Post-menopausal  Other Topics Concern  Not on file  Social History Narrative   Not on file   Social Determinants of Health   Financial Resource Strain:    Difficulty of Paying Living Expenses: Not on file  Food Insecurity:    Worried About [place] in the Last Year: Not on file   Ran Out of Food in the Last Year: Not on file  Transportation Needs:    Lack of Transportation (Medical): Not on file   Lack of Transportation (Non-Medical): Not on file  Physical Activity:    Days of Exercise per Week: Not on file   Minutes of Exercise per Session: Not on file  Stress:    Feeling of Stress : Not on file  Social Connections:    Frequency of Communication with Friends and Family: Not on file   Frequency of Social Gatherings with Friends and Family: Not on file   Attends Religious Services: Not on file   Active Member of Clubs or Organizations: Not on file   Attends Archivist Meetings: Not on file   Marital Status: Not on file  Intimate Partner Violence:    Fear of Current or Ex-Partner: Not on file   Emotionally Abused: Not on file   Physically Abused: Not on file   Sexually Abused: Not on file    Family History  Problem Relation Age of Onset   Colon polyps Father    Ulcerative colitis Father    CAD Father        Stent placement at age 12   Glaucoma Father    Hypertension Father    Colitis Father    Breast cancer Paternal Grandmother    Colon cancer Maternal Grandfather    Colon polyps Mother    Osteoporosis Mother    Thyroid disease Mother        hypo   Glaucoma Mother     Diverticulosis Mother    Sudden death Neg Hx    Hyperlipidemia Neg Hx    Heart attack Neg Hx    Diabetes Neg Hx     BP 118/84    Ht 5\' 5"  (1.651 m)    Wt 142 lb (64.4 kg)    LMP 10/02/2006 (Approximate)    BMI 23.63 kg/m   Review of Systems: See HPI above.     Objective:  Physical Exam:  Gen: NAD, comfortable in exam room  No gross deformity, scoliosis. No paraspinal TTP .  No midline or bony TTP. FROM. Strength LEs 5/5 all muscle groups.   2+ MSRs in patellar and achilles tendons, equal bilaterally. Negative SLRs. Sensation intact to light touch bilaterally. Negative logroll bilateral hips  Assessment & Plan:  1. Lumbar Strain - reassuring exam and no red flags. Discussed risk/benefits of testing urine for infections. Patient requested urine test. Denies dysuria and increased frequency,  - topical lidocaine patches - meloxicam 15 mg daily with food for pain and inflammation - exercises and stretches daily for back - UA and UCx - Consider PT if not improving in 1-2 weeks

## 2020-08-30 NOTE — Patient Instructions (Signed)
You have a lumbar strain. Topical lidocaine patches. Meloxicam 15mg  daily with food for pain and inflammation. Get the urinalysis and urine culture up at family practice after you leave today. Do home exercises/stretches for your low back daily. We will call you with your lab results. If you're not improving over the next 1-2 weeks let me know and we can put in an order for physical therapy. Follow up with me in 1 month or as needed if you're doing well.

## 2020-09-01 LAB — URINE CULTURE

## 2020-09-08 DIAGNOSIS — Z1211 Encounter for screening for malignant neoplasm of colon: Secondary | ICD-10-CM | POA: Diagnosis not present

## 2020-09-08 DIAGNOSIS — K648 Other hemorrhoids: Secondary | ICD-10-CM | POA: Diagnosis not present

## 2020-09-08 DIAGNOSIS — K5730 Diverticulosis of large intestine without perforation or abscess without bleeding: Secondary | ICD-10-CM | POA: Diagnosis not present

## 2020-09-08 DIAGNOSIS — D122 Benign neoplasm of ascending colon: Secondary | ICD-10-CM | POA: Diagnosis not present

## 2020-09-08 DIAGNOSIS — K635 Polyp of colon: Secondary | ICD-10-CM | POA: Diagnosis not present

## 2020-09-10 DIAGNOSIS — I10 Essential (primary) hypertension: Secondary | ICD-10-CM | POA: Diagnosis not present

## 2020-09-10 DIAGNOSIS — F411 Generalized anxiety disorder: Secondary | ICD-10-CM | POA: Diagnosis not present

## 2020-09-16 DIAGNOSIS — L570 Actinic keratosis: Secondary | ICD-10-CM | POA: Diagnosis not present

## 2020-09-16 DIAGNOSIS — L738 Other specified follicular disorders: Secondary | ICD-10-CM | POA: Diagnosis not present

## 2020-09-16 DIAGNOSIS — L718 Other rosacea: Secondary | ICD-10-CM | POA: Diagnosis not present

## 2020-09-16 DIAGNOSIS — D225 Melanocytic nevi of trunk: Secondary | ICD-10-CM | POA: Diagnosis not present

## 2020-09-16 DIAGNOSIS — D1801 Hemangioma of skin and subcutaneous tissue: Secondary | ICD-10-CM | POA: Diagnosis not present

## 2020-09-16 DIAGNOSIS — B078 Other viral warts: Secondary | ICD-10-CM | POA: Diagnosis not present

## 2020-09-16 DIAGNOSIS — L821 Other seborrheic keratosis: Secondary | ICD-10-CM | POA: Diagnosis not present

## 2020-09-16 DIAGNOSIS — L578 Other skin changes due to chronic exposure to nonionizing radiation: Secondary | ICD-10-CM | POA: Diagnosis not present

## 2020-09-16 DIAGNOSIS — L814 Other melanin hyperpigmentation: Secondary | ICD-10-CM | POA: Diagnosis not present

## 2020-09-20 ENCOUNTER — Observation Stay (HOSPITAL_COMMUNITY)
Admission: AD | Admit: 2020-09-20 | Discharge: 2020-09-21 | Disposition: A | Discharge status: Home / Self Care | Payer: 59 | Source: Ambulatory Visit | Attending: Family Medicine | Admitting: Family Medicine

## 2020-09-20 ENCOUNTER — Encounter (HOSPITAL_COMMUNITY): Payer: Self-pay | Admitting: Internal Medicine

## 2020-09-20 ENCOUNTER — Other Ambulatory Visit: Payer: Self-pay

## 2020-09-20 ENCOUNTER — Observation Stay (HOSPITAL_COMMUNITY): Payer: 59

## 2020-09-20 DIAGNOSIS — Z79899 Other long term (current) drug therapy: Secondary | ICD-10-CM | POA: Diagnosis not present

## 2020-09-20 DIAGNOSIS — Z20822 Contact with and (suspected) exposure to covid-19: Secondary | ICD-10-CM | POA: Diagnosis not present

## 2020-09-20 DIAGNOSIS — I10 Essential (primary) hypertension: Secondary | ICD-10-CM | POA: Diagnosis not present

## 2020-09-20 DIAGNOSIS — K5730 Diverticulosis of large intestine without perforation or abscess without bleeding: Secondary | ICD-10-CM | POA: Diagnosis not present

## 2020-09-20 DIAGNOSIS — K921 Melena: Secondary | ICD-10-CM | POA: Diagnosis present

## 2020-09-20 DIAGNOSIS — Z86010 Personal history of colonic polyps: Secondary | ICD-10-CM | POA: Diagnosis not present

## 2020-09-20 DIAGNOSIS — F32A Depression, unspecified: Secondary | ICD-10-CM | POA: Diagnosis not present

## 2020-09-20 DIAGNOSIS — R109 Unspecified abdominal pain: Secondary | ICD-10-CM

## 2020-09-20 DIAGNOSIS — Z87891 Personal history of nicotine dependence: Secondary | ICD-10-CM | POA: Insufficient documentation

## 2020-09-20 DIAGNOSIS — K529 Noninfective gastroenteritis and colitis, unspecified: Secondary | ICD-10-CM | POA: Insufficient documentation

## 2020-09-20 DIAGNOSIS — Z7982 Long term (current) use of aspirin: Secondary | ICD-10-CM | POA: Diagnosis not present

## 2020-09-20 DIAGNOSIS — I480 Paroxysmal atrial fibrillation: Secondary | ICD-10-CM | POA: Diagnosis not present

## 2020-09-20 DIAGNOSIS — K91840 Postprocedural hemorrhage and hematoma of a digestive system organ or structure following a digestive system procedure: Principal | ICD-10-CM | POA: Diagnosis present

## 2020-09-20 DIAGNOSIS — K625 Hemorrhage of anus and rectum: Secondary | ICD-10-CM | POA: Diagnosis not present

## 2020-09-20 HISTORY — DX: Essential (primary) hypertension: I10

## 2020-09-20 LAB — CBC
HCT: 35.4 % — ABNORMAL LOW (ref 36.0–46.0)
HCT: 37.4 % (ref 36.0–46.0)
Hemoglobin: 12.1 g/dL (ref 12.0–15.0)
Hemoglobin: 12.3 g/dL (ref 12.0–15.0)
MCH: 33.2 pg (ref 26.0–34.0)
MCH: 32.1 pg (ref 26.0–34.0)
MCHC: 34.2 g/dL (ref 30.0–36.0)
MCHC: 32.9 g/dL (ref 30.0–36.0)
MCV: 97 fL (ref 80.0–100.0)
MCV: 97.7 fL (ref 80.0–100.0)
Platelets: 297 10*3/uL (ref 150–400)
Platelets: 279 10*3/uL (ref 150–400)
RBC: 3.65 MIL/uL — ABNORMAL LOW (ref 3.87–5.11)
RBC: 3.83 MIL/uL — ABNORMAL LOW (ref 3.87–5.11)
RDW: 12.8 % (ref 11.5–15.5)
RDW: 12.7 % (ref 11.5–15.5)
WBC: 4.9 10*3/uL (ref 4.0–10.5)
WBC: 6.1 10*3/uL (ref 4.0–10.5)
nRBC: 0 % (ref 0.0–0.2)
nRBC: 0 % (ref 0.0–0.2)

## 2020-09-20 LAB — BASIC METABOLIC PANEL
Anion gap: 10 (ref 5–15)
BUN: 10 mg/dL (ref 8–23)
CO2: 24 mmol/L (ref 22–32)
Calcium: 9.4 mg/dL (ref 8.9–10.3)
Chloride: 102 mmol/L (ref 98–111)
Creatinine, Ser: 0.76 mg/dL (ref 0.44–1.00)
GFR, Estimated: >60 mL/min (ref >60)
Glucose, Bld: 118 mg/dL — ABNORMAL HIGH (ref 70–99)
Potassium: 3.6 mmol/L (ref 3.5–5.1)
Sodium: 136 mmol/L (ref 135–145)

## 2020-09-20 LAB — RESP PANEL BY RT-PCR (FLU A&B, COVID) ARPGX2
Influenza A by PCR: NEGATIVE
Influenza B by PCR: NEGATIVE
SARS Coronavirus 2 by RT PCR: NEGATIVE

## 2020-09-20 LAB — TYPE AND SCREEN
ABO/RH(D): O POS
Antibody Screen: NEGATIVE

## 2020-09-20 LAB — HIV ANTIBODY (ROUTINE TESTING W REFLEX): HIV Screen 4th Generation wRfx: NONREACTIVE

## 2020-09-20 LAB — ABO/RH: ABO/RH(D): O POS

## 2020-09-20 MED ORDER — HYDRALAZINE HCL 20 MG/ML IJ SOLN: 10.0000 mg | INTRAMUSCULAR | Status: DC | PRN

## 2020-09-20 MED ORDER — SIMETHICONE 80 MG PO CHEW
80.0000 mg | CHEWABLE_TABLET | Freq: Four times a day (QID) | ORAL | Status: DC | PRN
  Filled 2020-09-20: qty 1

## 2020-09-20 MED ORDER — ZOLPIDEM TARTRATE 5 MG PO TABS
5.0000 mg | ORAL_TABLET | Freq: Every evening | ORAL | Status: DC | PRN
  Administered 2020-09-20: 5 mg via ORAL
  Filled 2020-09-20: qty 1

## 2020-09-20 MED ORDER — CITALOPRAM HYDROBROMIDE 20 MG PO TABS
20.0000 mg | ORAL_TABLET | Freq: Every day | ORAL | Status: DC
  Administered 2020-09-20: 20 mg via ORAL
  Filled 2020-09-20: qty 1

## 2020-09-20 MED ORDER — METOPROLOL SUCCINATE ER 25 MG PO TB24
25.0000 mg | ORAL_TABLET | Freq: Every day | ORAL | Status: DC
  Administered 2020-09-20: 25 mg via ORAL
  Filled 2020-09-20: qty 1

## 2020-09-20 MED ORDER — ALBUTEROL SULFATE (2.5 MG/3ML) 0.083% IN NEBU: 2.5000 mg | INHALATION_SOLUTION | Freq: Four times a day (QID) | RESPIRATORY_TRACT | Status: DC | PRN

## 2020-09-20 MED ORDER — ACETAMINOPHEN 325 MG PO TABS
650.0000 mg | ORAL_TABLET | Freq: Four times a day (QID) | ORAL | Status: DC | PRN
  Administered 2020-09-20: 650 mg via ORAL
  Filled 2020-09-20: qty 2

## 2020-09-20 NOTE — Plan of Care (Signed)
  Problem: Education: Goal: Knowledge of General Education information will improve Description: Including pain rating scale, medication(s)/side effects and non-pharmacologic comfort measures Outcome: Progressing   Problem: Health Behavior/Discharge Planning: Goal: Ability to manage health-related needs will improve Outcome: Progressing   Problem: Clinical Measurements: Goal: Ability to maintain clinical measurements within normal limits will improve Outcome: Progressing Goal: Will remain free from infection Outcome: Progressing Goal: Diagnostic test results will improve Outcome: Progressing   Problem: Activity: Goal: Risk for activity intolerance will decrease Outcome: Progressing   Problem: Coping: Goal: Level of anxiety will decrease Outcome: Progressing   Problem: Pain Managment: Goal: General experience of comfort will improve Outcome: Progressing   Problem: Safety: Goal: Ability to remain free from injury will improve Outcome: Progressing   Problem: Skin Integrity: Goal: Risk for impaired skin integrity will decrease Outcome: Progressing   Problem: Education: Goal: Ability to identify signs and symptoms of gastrointestinal bleeding will improve Outcome: Progressing   Problem: Bowel/Gastric: Goal: Will show no signs and symptoms of gastrointestinal bleeding Outcome: Progressing   Problem: Fluid Volume: Goal: Will show no signs and symptoms of excessive bleeding Outcome: Progressing   Problem: Clinical Measurements: Goal: Complications related to the disease process, condition or treatment will be avoided or minimized Outcome: Progressing

## 2020-09-20 NOTE — Consult Note (Addendum)
Reason for Consult: Rectal bleeding. Referring Physician: [facility].  Kara Warren is an 64 y.o. female.  HPI: 64 year old white female, with multiple medical problems listed below, has been having some rectal bleeding since yesterday with BM's. She has had a lot of gas and bloating but denies having any diarrhea or constipation. She had an ascending colon polyp removed by a hot snare on 09/08/2020. She denies using any OTC NSAIDS except for one Aspirin a couple of days ago. There is no history of abdominal pain, nausea, vomiting, dizziness, near syncope or syncope.     Past Medical History:  Diagnosis Date  . Abdominal pain, left upper quadrant   . Acute bronchospasm   . Acute neck pain 07/02/2019  . Acute sinusitis, unspecified 05/20/2009   Centricity Description: SINUSITIS - ACUTE-NOS Qualifier: Diagnosis of  By: Birdie Riddle MD, Belenda Cruise   Centricity Description: ACUTE SINUSITIS, UNSPECIFIED Qualifier: Diagnosis of  By: Alveta Heimlich MD, Cornelia Copa   Centricity Description: SINUSITIS, ACUTE Qualifier: Diagnosis of  By: Koleen Nimrod MD, Dellis Filbert    . Atypical chest pain 03/05/2013  . Body mass index (BMI) of 23.0-23.9 in adult   . Cervicalgia   . Colon polyp   . Costochondral junction syndrome   . Cough   . DEPRESSION 08/25/2008   Qualifier: Diagnosis of  By: Ronnald Ramp CMA, Chemira    . Diarrhea   . Diverticulosis 07/27/2012  . Dorsalgia   . Elevated blood pressure reading without diagnosis of hypertension   . Endometrial polyp 8/08   benign  . Essential hypertension 09/20/2020  . Family history of osteoporosis 12/28/2011   Mother pt had normal Dexa 11/2009   . Fatigue   . GERD (gastroesophageal reflux disease)   . HIP PAIN, RIGHT 04/12/2009   Qualifier: Diagnosis of  By: Oneida Alar MD, KARL    . History of hiatal hernia   . HYPERLIPIDEMIA 08/25/2008   Qualifier: Diagnosis of  By: Birdie Riddle MD, Belenda Cruise    . IBS (irritable bowel syndrome)   . Left hip pain 07/30/2013  . Left wrist pain 07/30/2013  .  Lumbar strain, initial encounter 07/28/2019  . LUNG NODULE 11/19/2009   Neg CT    . 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 Menopause   . Migraine   . NEUTROPENIA UNSPECIFIED 10/07/2008   Qualifier: Diagnosis of  By: Birdie Riddle MD, Belenda Cruise    . NONSPCIFC ABN FINDING RAD & OTH EXAM LUNG FIELD 11/19/2009   Qualifier: Diagnosis of  By: Alveta Heimlich MD, Cornelia Copa    . Nonspecific (abnormal) findings on radiological and other examination of body structure 11/19/2009   Qualifier: Diagnosis of By: Alveta Heimlich MD, Bennie Dallas list entry automatically replaced. Please review for accuracy.  . Otalgia, unspecified ear   . Pain in throat   . Palpitations   . Paroxysmal atrial fibrillation (HCC)   . Peroneal tendinitis of right lower extremity 03/12/2014  . Plantar wart 04/02/2014  . Pneumonia   . RHINITIS 06/21/2009   Qualifier: Diagnosis of  By: Birdie Riddle MD, Belenda Cruise    . Right ankle injury, subsequent encounter 01/11/2018  . Right foot pain 08/09/2011  . Sinusitis, chronic   . Skene's gland abscess 2009   patient unaware  . Sprain of unspecified ligament of right ankle, subsequent encounter   . TRANSAMINASES, SERUM, ELEVATED 12/07/2009   Qualifier: Diagnosis of  By: Inda Castle FNP, Wellington Hampshire   . Unspecified cataract   . Urinary tract infection, site not specified   . Vitamin  D deficiency    Past Surgical History:  Procedure Laterality Date  . COLONOSCOPY    . DILATION AND CURETTAGE OF UTERUS  8/08  . GYNECOLOGIC CRYOSURGERY  1980  . HYSTEROSCOPY WITH D & C N/A 03/30/2017   Procedure: DILATATION & CURETTAGE/HYSTEROSCOPY WITH ULTRASOUND GUIDANCE;  Surgeon: Megan Salon, MD;  Location: [facility];  Service: Gynecology;  Laterality: N/A;  . UPPER GI ENDOSCOPY    . uterine ablation  2007   Family History  Problem Relation Age of Onset  . Colon polyps Father   . Ulcerative colitis Father   . CAD Father        Stent placement at age 24  . Glaucoma Father   . Hypertension Father   . Colitis  Father   . Breast cancer Paternal Grandmother   . Colon cancer Maternal Grandfather   . Colon polyps Mother   . Osteoporosis Mother   . Thyroid disease Mother        hypo  . Glaucoma Mother   . Diverticulosis Mother   . Sudden death Neg Hx   . Hyperlipidemia Neg Hx   . Heart attack Neg Hx   . Diabetes Neg Hx    Social History:  reports that she quit smoking about 41 years ago. Her smoking use included cigarettes. She has a 0.30 pack-year smoking history. She has never used smokeless tobacco. She reports current alcohol use of about 7.0 standard drinks of alcohol per week. She reports that she does not use drugs.  Allergies: No Known Allergies  Medications: I have reviewed the patient's current medications.  Results for orders placed or performed during the hospital encounter of 09/20/20 (from the past 48 hour(s))  HIV Antibody (routine testing w rflx)     Status: None   Collection Time: 09/20/20  2:16 PM  Result Value Ref Range   HIV Screen 4th Generation wRfx Non Reactive Non Reactive    Comment: Performed at [facility], [address] [place], [place] [address]  CBC     Status: Abnormal   Collection Time: 09/20/20  2:16 PM  Result Value Ref Range   WBC 4.9 4.0 - 10.5 K/uL   RBC 3.65 (L) 3.87 - 5.11 MIL/uL   Hemoglobin 12.1 12.0 - 15.0 g/dL   HCT 35.4 (L) 36.0 - 46.0 %   MCV 97.0 80.0 - 100.0 fL   MCH 33.2 26.0 - 34.0 pg   MCHC 34.2 30.0 - 36.0 g/dL   RDW 12.8 11.5 - 15.5 %   Platelets 297 150 - 400 K/uL   nRBC 0.0 0.0 - 0.2 %    Comment: Performed at [facility], [place] [address] [place], [place] [address]  Type and screen [place]     Status: None   Collection Time: 09/20/20  2:16 PM  Result Value Ref Range   ABO/RH(D) O POS    Antibody Screen NEG    Sample Expiration      09/23/2020,2359 Performed at [facility], [place] [address] [place], [place] [address]   Basic metabolic panel     Status: Abnormal   Collection Time:  09/20/20  2:16 PM  Result Value Ref Range   Sodium 136 135 - 145 mmol/L   Potassium 3.6 3.5 - 5.1 mmol/L   Chloride 102 98 - 111 mmol/L   CO2 24 22 - 32 mmol/L   Glucose, Bld 118 (H) 70 - 99 mg/dL    Comment:  Glucose reference range applies only to samples taken after fasting for at least 8 hours.   BUN 10 8 - 23 mg/dL   Creatinine, Ser 0.76 0.44 - 1.00 mg/dL   Calcium 9.4 8.9 - 10.3 mg/dL   GFR, Estimated >60 >60 mL/min    Comment: (NOTE) Calculated using the CKD-EPI Creatinine Equation (2021)    Anion gap 10 5 - 15    Comment: Performed at [facility] [address], [place], [place] [address]  ABO/Rh     Status: None   Collection Time: 09/20/20  5:23 PM  Result Value Ref Range   ABO/RH(D)      O POS Performed at [facility] [address] [place], [place] [address]   CBC     Status: Abnormal   Collection Time: 09/20/20  5:23 PM  Result Value Ref Range   WBC 6.1 4.0 - 10.5 K/uL   RBC 3.83 (L) 3.87 - 5.11 MIL/uL   Hemoglobin 12.3 12.0 - 15.0 g/dL   HCT 37.4 36.0 - 46.0 %   MCV 97.7 80.0 - 100.0 fL   MCH 32.1 26.0 - 34.0 pg   MCHC 32.9 30.0 - 36.0 g/dL   RDW 12.7 11.5 - 15.5 %   Platelets 279 150 - 400 K/uL   nRBC 0.0 0.0 - 0.2 %    Comment: Performed at [facility], [place] [address] [place], [place] [address]  Resp Panel by RT-PCR (Flu A&B, Covid) Nasopharyngeal Swab     Status: None   Collection Time: 09/20/20  5:30 PM   Specimen: Nasopharyngeal Swab; Nasopharyngeal(NP) swabs in vial transport medium  Result Value Ref Range   SARS Coronavirus 2 by RT PCR NEGATIVE NEGATIVE    Comment: (NOTE) SARS-CoV-2 target nucleic acids are NOT DETECTED.  The SARS-CoV-2 RNA is generally detectable in upper respiratory specimens during the acute phase of infection. The lowest concentration of SARS-CoV-2 viral copies this assay can detect is 138 copies/mL. A negative result does not preclude SARS-Cov-2 infection and should not be used as the sole basis for  treatment or other patient management decisions. A negative result may occur with  improper specimen collection/handling, submission of specimen other than nasopharyngeal swab, presence of viral mutation(s) within the areas targeted by this assay, and inadequate number of viral copies(<138 copies/mL). A negative result must be combined with clinical observations, patient history, and epidemiological information. The expected result is Negative.  Fact Sheet for Patients:  EntrepreneurPulse.com.au  Fact Sheet for Healthcare Providers:  IncredibleEmployment.be  This test is no t yet approved or cleared by the [place] FDA and  has been authorized for detection and/or diagnosis of SARS-CoV-2 by FDA under an Emergency Use Authorization (EUA). This EUA will remain  in effect (meaning this test can be used) for the duration of the COVID-19 declaration under Section 564(b)(1) of the Act, 21 U.S.C.section 360bbb-3(b)(1), unless the authorization is terminated  or revoked sooner.       Influenza A by PCR NEGATIVE NEGATIVE   Influenza B by PCR NEGATIVE NEGATIVE    Comment: (NOTE) The Xpert Xpress SARS-CoV-2/FLU/RSV plus assay is intended as an aid in the diagnosis of influenza from Nasopharyngeal swab specimens and should not be used as a sole basis for treatment. Nasal washings and aspirates are unacceptable for Xpert Xpress SARS-CoV-2/FLU/RSV testing.  Fact Sheet for Patients: EntrepreneurPulse.com.au  Fact Sheet for Healthcare Providers: IncredibleEmployment.be  This test is not yet approved or cleared by the [place]  States FDA and has been authorized for detection and/or diagnosis of SARS-CoV-2 by FDA under an Emergency Use Authorization (EUA). This EUA will remain in effect (meaning this test can be used) for the duration of the COVID-19 declaration under Section 564(b)(1) of the Act, 21 U.S.C. section  360bbb-3(b)(1), unless the authorization is terminated or revoked.  Performed at [facility], [place] [address] [place], [place] [address]    DG ABD ACUTE 2+V W 1V CHEST  Result Date: 09/20/2020 CLINICAL DATA:  Abdominal pain EXAM: DG ABDOMEN ACUTE WITH 1 VIEW CHEST COMPARISON:  CT AP 05/31/2020 FINDINGS: There is no evidence of dilated bowel loops or free intraperitoneal air. Scattered nondilated, air-filled loops of small bowel are noted. No radiopaque calculi or other significant radiographic abnormality is seen. Heart size and mediastinal contours are within normal limits. Both lungs are clear. IMPRESSION: 1. Nonobstructive bowel gas pattern. 2. No acute cardiopulmonary abnormalities. Electronically Signed   By: Kerby Moors M.D.   On: 09/20/2020 16:39    Review of Systems Blood pressure (!) 157/85, pulse 60, temperature 98.3 F (36.8 C), resp. rate 19, last menstrual period 10/02/2006, SpO2 99 %. Physical Exam Constitutional:      Appearance: Normal appearance. She is normal weight.  HENT:     Head: Normocephalic and atraumatic.     Mouth/Throat:     Mouth: Mucous membranes are moist.  Cardiovascular:     Rate and Rhythm: Normal rate and regular rhythm.     Pulses: Normal pulses.     Heart sounds: Normal heart sounds.  Abdominal:     General: Abdomen is flat. There is no distension.     Palpations: Abdomen is soft.  Musculoskeletal:        General: Normal range of motion.     Cervical back: Normal range of motion and neck supple.  Skin:    General: Skin is warm and dry.  Neurological:     General: No focal deficit present.     Mental Status: She is alert and oriented to person, place, and time.  Psychiatric:        Mood and Affect: Mood normal.        Behavior: Behavior normal.   Assessment/Plan: 1) Post-polypectomy bleed-with hemoglobin at 128 gms/dl this AM in my office and 12.3 gms/dl today. 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 No evidence of active bleeding; will observe  for another day or so before repeating a colonoscopy for control of bleeding. 2) GERD-On PPI's and Pepcid. 3) Depression. 4) HTN Darell Saputo 09/20/2020, 7:20 PM

## 2020-09-20 NOTE — H&P (Addendum)
History and Physical    Kara Warren IEP:[id number] DOB: 06/25/1956 DOA: 09/20/2020  Referring MD/NP/PA: Young Berry, MD PCP: Fanny Bien, MD  Patient coming from: Direct admission  Chief Complaint: Blood in stool  I have personally briefly reviewed patient's old medical records in [place]   HPI: Kara Warren is a 64 y.o. female with medical history significant of paroxysmal atrial fibrillation on aspirin, hyperlipidemia, depression, and benign colon polyps who presents with complaints of blood in stools.  Patient noted that she had a colonoscopy approximately 3 months ago where a large polyp was found partially resected by Dr. Collene Mares of gastroenterology.  She had a second colonoscopy approximately 12 days ago (December 8), where the rest of the follow-up was able to be resected.  Immediately following the procedure patient reported no issues with bleeding or pain.  1 week following the procedure she had an episode of blood in her stools. It was suspected to be residual bleeding that she had discussed with Dr. Collene Mares.  Last week she noticed that her bowel sounds were abnormally loud.  Yesterday morning she had bright red blood in her stool, toilet bowl, and with wiping.  She had frequent bowel movements all with blood present.  Patient reports she normally has frequent bowel movements in the morning so this was not the normal except for blood being present.  Stools were formed and denies diarrhea.  Patient was unable to tell if the blood was mixed in the stools or coated around them, but states they were not black and tarry.  Patient had previously temporarily then on meloxicam for 5 days at the end of last month, but had stopped this medicine 1 week before the procedure and not restarted.  She had taken just 1 dose of her aspirin before stopping again after the procedure on due to her symptoms.  Associated symptoms included right lower abdominal quadrant discomfort and gas.  She  had additional bowel movement last night and then frequent bowel movements this morning that were bloody and appeared to be more severe.  Dr. Collene Mares had requested direct admission for observation of bleeding.  [facility] called and accepted patient to a MedSurg bed.  ED Course: As seen above  Review of Systems  Constitutional: Positive for malaise/fatigue. Negative for fever.  Eyes: Negative for photophobia and discharge.  Respiratory: Negative for cough and shortness of breath.   Cardiovascular: Negative for chest pain.  Gastrointestinal: Positive for abdominal pain and blood in stool. Negative for constipation.  Musculoskeletal: Negative for falls.  Neurological: Negative for focal weakness and loss of consciousness.  Psychiatric/Behavioral: Negative for substance abuse.  All other systems reviewed and are negative.   Past Medical History:  Diagnosis Date   Abdominal pain, left upper quadrant    Acute bronchospasm    Acute neck pain 07/02/2019   Acute sinusitis, unspecified 05/20/2009   Centricity Description: SINUSITIS - ACUTE-NOS Qualifier: Diagnosis of  By: Birdie Riddle MD, Belenda Cruise   Centricity Description: ACUTE SINUSITIS, UNSPECIFIED Qualifier: Diagnosis of  By: Alveta Heimlich MD, Suzanne Boron Description: SINUSITIS, ACUTE Qualifier: Diagnosis of  By: Koleen Nimrod MD, Dellis Filbert     Atypical chest pain 03/05/2013   Body mass index (BMI) of 23.0-23.9 in adult    Cervicalgia    Colon polyp    Costochondral junction syndrome    Cough    DEPRESSION 08/25/2008   Qualifier: Diagnosis of  By: Ronnald Ramp CMA, Chemira     Diarrhea    Diverticulosis 07/27/2012  Dorsalgia    Elevated blood pressure reading without diagnosis of hypertension    Endometrial polyp 8/08   benign   Family history of osteoporosis 12/28/2011   Mother pt had normal Dexa 11/2009    Fatigue    GERD (gastroesophageal reflux disease)    HIP PAIN, RIGHT 04/12/2009   Qualifier: Diagnosis of  By: Oneida Alar MD, KARL      History of hiatal hernia    HYPERLIPIDEMIA 08/25/2008   Qualifier: Diagnosis of  By: Birdie Riddle MD, Belenda Cruise     IBS (irritable bowel syndrome)    Left hip pain 07/30/2013   Left wrist pain 07/30/2013   Lumbar strain, initial encounter 07/28/2019   LUNG NODULE 11/19/2009   Neg CT     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 Please review for accuracy.   Otalgia, unspecified ear    Pain in throat    Palpitations    Paroxysmal atrial fibrillation (HCC)    Peroneal tendinitis of right lower extremity 03/12/2014   Plantar wart 04/02/2014   Pneumonia    RHINITIS 06/21/2009   Qualifier: Diagnosis of  By: Birdie Riddle MD, Belenda Cruise     Right ankle injury, subsequent encounter 01/11/2018   Right foot pain 08/09/2011   Sinusitis, chronic    Skene's gland abscess 2009   patient unaware   Sprain of unspecified ligament of right ankle, subsequent encounter    TRANSAMINASES, SERUM, ELEVATED 12/07/2009   Qualifier: Diagnosis of  By: Inda Castle FNP, Melissa S    Unspecified cataract    Urinary tract infection, site not specified    Vitamin D deficiency     Past Surgical History:  Procedure Laterality Date   COLONOSCOPY     DILATION AND CURETTAGE OF UTERUS  8/08   GYNECOLOGIC CRYOSURGERY  1980   HYSTEROSCOPY WITH D & C N/A 03/30/2017   Procedure: DILATATION & CURETTAGE/HYSTEROSCOPY WITH ULTRASOUND GUIDANCE;  Surgeon: Megan Salon, MD;  Location: [facility];  Service: Gynecology;  Laterality: N/A;   UPPER GI ENDOSCOPY     uterine ablation  2007      reports that she quit smoking about 41 years ago. Her smoking use included cigarettes. She has a 0.30 pack-year smoking history. She has never used smokeless tobacco. She reports current alcohol use of about 7.0 standard drinks of alcohol per week. She reports that she does not use drugs.  No Known Allergies  Family History  Problem Relation Age of Onset   Colon polyps Father    Ulcerative colitis Father    CAD Father        Stent placement at age 77   Glaucoma Father    Hypertension Father    Colitis Father    Breast cancer Paternal Grandmother    Colon cancer Maternal Grandfather    Colon polyps Mother    Osteoporosis Mother    Thyroid disease Mother        hypo   Glaucoma Mother    Diverticulosis Mother    Sudden death Neg Hx  Hyperlipidemia Neg Hx    Heart attack Neg Hx    Diabetes Neg Hx     Prior to Admission medications   Medication Sig Start Date End Date Taking? Authorizing Provider  aspirin EC 81 MG tablet Take 1 tablet (81 mg total) by mouth daily. Swallow whole. 03/31/20   Burnell Blanks, MD  azelastine (ASTELIN) 0.1 % nasal spray Place 1 spray into both nostrils as needed.  02/04/19   [provider]  Boswellia-Glucosamine-Vit D (OSTEO BI-FLEX ONE PER DAY PO) Take 1 tablet by mouth daily.    [provider]  citalopram (CELEXA) 20 MG tablet TAKE 1 TABLET BY MOUTH ONCE DAILY 09/09/14   Schoenhoff, Altamese Cabal, MD  diltiazem (CARDIZEM) 30 MG tablet Take 1 Tablet Every 4 Hours As Needed For HR >100 10/27/19   Sherran Needs, NP  famotidine (PEPCID) 20 MG tablet Take 20 mg by mouth as needed for heartburn or indigestion.    [provider]  meloxicam (MOBIC) 15 MG tablet Take 1 tablet (15 mg total) by mouth daily. 08/30/20   Hudnall, Sharyn Lull, MD  metoprolol succinate (TOPROL-XL) 25 MG 24 hr tablet Take 1 tablet (25 mg total) by mouth daily. 10/09/19   Burnell Blanks, MD  metroNIDAZOLE (FLAGYL) 500 MG tablet  Take 1 tablet (500 mg total) by mouth 2 (two) times daily. 05/31/20   Horton, Barbette Hair, MD  montelukast (SINGULAIR) 10 MG tablet Take 10 mg by mouth as needed.  11/06/18   [provider]  Multiple Vitamins-Minerals (MULTI FOR HER 50+) TABS Take 1 tablet by mouth daily.      [provider]  Omega-3 Fatty Acids (FISH OIL PO) Take 1 capsule by mouth daily.    [provider]  omeprazole (PRILOSEC) 40 MG capsule Take by mouth daily.  10/21/19   [provider]  Probiotic Product (PROBIOTIC PO) Take 1 capsule by mouth daily. Called ultra spectrum    [provider]    Physical Exam:  Constitutional: Older female who appears to be in no acute distress at this time Vitals:   09/20/20 1300  BP: (!) 157/85  Pulse: (!) 59  Resp: 19  Temp: 98.3 F (36.8 C)  SpO2: 99%   Eyes: PERRL, lids and conjunctivae normal ENMT: Mucous membranes are moist. Dentition fair. Neck: normal, supple, no masses, no thyromegaly Respiratory: clear to auscultation bilaterally, no wheezing, no crackles. Normal respiratory effort. No accessory muscle use.  Cardiovascular: Regular rate and rhythm, no murmurs / rubs / gallops. No extremity edema. 2+ pedal pulses. No carotid bruits.  Abdomen: Mild tenderness palpation along the right lower quadrant of the abdomen.  Sounds are increased Musculoskeletal: no clubbing appreciated in the patient's fingers.   Skin: no rashes, lesions, ulcers. No induration.  No pallor appreciated at this time. Neurologic: CN 2-12 grossly intact.patient can move all extremities. Psychiatric: Normal judgment and insight. Alert and oriented x 3. Normal mood.     Labs on Admission: I have personally reviewed following labs and imaging studies  CBC: No results for input(s): WBC, NEUTROABS, HGB, HCT, MCV, PLT in the last 168 hours. Basic Metabolic Panel: No results for input(s): NA, K, CL, CO2, GLUCOSE, BUN, CREATININE, CALCIUM, MG, PHOS in the last  168 hours. GFR: CrCl cannot be calculated (Patient's most recent lab result is older than the maximum 21 days allowed.). Liver Function Tests: No results for input(s): AST, ALT, ALKPHOS, BILITOT, PROT, ALBUMIN in the last 168 hours. No results  for input(s): LIPASE, AMYLASE in the last 168 hours. No results for input(s): AMMONIA in the last 168 hours. Coagulation Profile: No results for input(s): INR, PROTIME in the last 168 hours. Cardiac Enzymes: No results for input(s): CKTOTAL, CKMB, CKMBINDEX, TROPONINI in the last 168 hours. BNP (last 3 results) No results for input(s): PROBNP in the last 8760 hours. HbA1C: No results for input(s): HGBA1C in the last 72 hours. CBG: No results for input(s): GLUCAP in the last 168 hours. Lipid Profile: No results for input(s): CHOL, HDL, LDLCALC, TRIG, CHOLHDL, LDLDIRECT in the last 72 hours. Thyroid Function Tests: No results for input(s): TSH, T4TOTAL, FREET4, T3FREE, THYROIDAB in the last 72 hours. Anemia Panel: No results for input(s): VITAMINB12, FOLATE, FERRITIN, TIBC, IRON, RETICCTPCT in the last 72 hours. Urine analysis:    Component Value Date/Time   COLORURINE YELLOW 05/30/2020 1800   APPEARANCEUR CLEAR 05/30/2020 1800   LABSPEC 1.020 05/30/2020 1800   PHURINE 6.0 05/30/2020 1800   GLUCOSEU NEGATIVE 05/30/2020 1800   HGBUR SMALL (A) 05/30/2020 1800   BILIRUBINUR negative 08/30/2020 1020   BILIRUBINUR neg 12/20/2011 1354   KETONESUR negative 08/30/2020 1020   KETONESUR NEGATIVE 05/30/2020 1800   PROTEINUR negative 08/30/2020 1020   PROTEINUR NEGATIVE 05/30/2020 1800   UROBILINOGEN 0.2 08/30/2020 1020   UROBILINOGEN 0.2 02/26/2013 1732   NITRITE Negative 08/30/2020 1020   NITRITE NEGATIVE 05/30/2020 1800   LEUKOCYTESUR Negative 08/30/2020 1020   LEUKOCYTESUR NEGATIVE 05/30/2020 1800   Sepsis Labs: No results found for this or any previous visit (from the past 240 hour(s)).   Radiological Exams on Admission: No results  found.    Assessment/Plan Colonoscopy causing postprocedural bleeding: Acute.  Patient presents with complaints of bright red blood in stools.  Recently had colonoscopy by Dr. Collene Mares on 12/8 with resection of residual polyp previously attempted to be resected 3 months ago.  Denies any recent or significant use of NSAID/aspirin or blood thinners.  She reported one episode of bleeding last week, but subsequently has had several episodes of bleeding since yesterday morning.  Vital signs currently stable. -Admit to MedSurg bed -Clear liquid diet  -Check abdominal and chest x-ray -Serial monitoring of hemoglobin and hematocrit(initial hemoglobin 12.1) -Type and screen for possible need of blood -Transfuse blood products if needed -Appreciate Dr. Collene Mares consultative services, will follow-up for any further recommendations  Paroxysmal atrial fibrillation: Patient appears to be in sinus rhythm at this time.   CHA2DS2-VASc score =2. Followed by Dr. Angelena Form of cardiology in the outpatient setting. Patient had temporarily been on anticoagulation earlier this year, but it was decided due to limited risk factors that she could be on aspirin alone. -Holding aspirin due bleeding  Essential hypertension: Blood pressures mildly elevated 157/85 on admission.  Home medications include metoprolol succinate 25 mg daily. -Continue metoprolol -Hydralazine IV as needed for elevated systolic blood pressures greater than 180.  History of depression: Home medications include citalopram 20 mg daily -Continue citalopram DVT prophylaxis: SCD Code Status: Full Family Communication: Significant other updated at bedside Disposition Plan: Hopefully discharge home once medically stable Consults called: GI Admission status: Observation, requiring less than 2 midnight stay   Norval Morton MD [facility]   If 7PM-7AM, please contact night-coverage   09/20/2020, 1:02 PM

## 2020-09-21 DIAGNOSIS — K529 Noninfective gastroenteritis and colitis, unspecified: Secondary | ICD-10-CM | POA: Diagnosis not present

## 2020-09-21 DIAGNOSIS — I480 Paroxysmal atrial fibrillation: Secondary | ICD-10-CM | POA: Diagnosis not present

## 2020-09-21 DIAGNOSIS — K91840 Postprocedural hemorrhage and hematoma of a digestive system organ or structure following a digestive system procedure: Secondary | ICD-10-CM | POA: Diagnosis not present

## 2020-09-21 DIAGNOSIS — Z20822 Contact with and (suspected) exposure to covid-19: Secondary | ICD-10-CM | POA: Diagnosis not present

## 2020-09-21 DIAGNOSIS — Z87891 Personal history of nicotine dependence: Secondary | ICD-10-CM | POA: Diagnosis not present

## 2020-09-21 DIAGNOSIS — Z79899 Other long term (current) drug therapy: Secondary | ICD-10-CM | POA: Diagnosis not present

## 2020-09-21 DIAGNOSIS — Z7982 Long term (current) use of aspirin: Secondary | ICD-10-CM | POA: Diagnosis not present

## 2020-09-21 DIAGNOSIS — I10 Essential (primary) hypertension: Secondary | ICD-10-CM | POA: Diagnosis not present

## 2020-09-21 LAB — BASIC METABOLIC PANEL
Anion gap: 13 (ref 5–15)
BUN: 6 mg/dL — ABNORMAL LOW (ref 8–23)
CO2: 21 mmol/L — ABNORMAL LOW (ref 22–32)
Calcium: 9.5 mg/dL (ref 8.9–10.3)
Chloride: 102 mmol/L (ref 98–111)
Creatinine, Ser: 0.7 mg/dL (ref 0.44–1.00)
GFR, Estimated: >60 mL/min (ref >60)
Glucose, Bld: 84 mg/dL (ref 70–99)
Potassium: 3.9 mmol/L (ref 3.5–5.1)
Sodium: 136 mmol/L (ref 135–145)

## 2020-09-21 LAB — CBC
HCT: 37.8 % (ref 36.0–46.0)
Hemoglobin: 12.4 g/dL (ref 12.0–15.0)
MCH: 32 pg (ref 26.0–34.0)
MCHC: 32.8 g/dL (ref 30.0–36.0)
MCV: 97.7 fL (ref 80.0–100.0)
Platelets: 298 10*3/uL (ref 150–400)
RBC: 3.87 MIL/uL (ref 3.87–5.11)
RDW: 12.8 % (ref 11.5–15.5)
WBC: 5.2 10*3/uL (ref 4.0–10.5)
nRBC: 0 % (ref 0.0–0.2)

## 2020-09-21 NOTE — Discharge Summary (Signed)
Physician Discharge Summary  Kara Warren [id number] DOB: 05-17-1956 DOA: 09/20/2020  PCP: Fanny Bien, MD  Admit date: 09/20/2020 Discharge date: 09/21/2020  Admitted From: Home  Disposition:  Home   Recommendations for Outpatient Follow-up:  1. Follow up with Dr. Collene Mares within 1 week 2. Dr. Collene Mares: Please obtain CBC at follow up   Home Health: N/A  Equipment/Devices: N/A  Discharge Condition: Good  CODE STATUS: FULL Diet recommendation: Per Dr. Collene Mares  Brief/Interim Summary: Kara Warren is a 64 y.o. F with HTN who had a colonoscopy 2 weeks ago, had an ascending colon polyp removed by hot snare during that procedure.  After the procedure, was well for about 1 week, then resumed her aspirin, one dose, after which she noticed maroon stools.  Since then, she has had borborygmi and then the day before admission had recurrence of several maroon stools, which persisted until today.  In the ER, HR and Hgb stable and within normal limits.  Abdominal radiograph normal.     PRINCIPAL HOSPITAL DIAGNOSIS: Post-polypectomy bleeding    Discharge Diagnoses:   Post-polypectomy bleeding Patient observed overnight; without stools overnight, nor passing blood until this morning, again small maroon stool x3.  No tachycardia, no fever, no chills or constitutional symptoms to suggest infection.  Abdominal exam benign, perforation ruled out clinically and confirmed with x-ray.  Discussed with Dr. Collene Mares.  Hgb stable, exam benign and vitals within normal limits.    Home with close GI follow up.  Patient advised to call Dr. Collene Mares today.         Discharge Instructions  Discharge Instructions    Diet - low sodium heart healthy   Complete by: As directed    Discharge instructions   Complete by: As directed    You were observed overnight for rectal bleeding. This appears to be most consistent with a "post-polypectomy" bleed. Your hemoglobin level was stable (12.1 > 12.3 >  12.4)  It may be that you have continued, "stuttering" bleeding in bowel movements for a few more days.  Call Dr. Collene Mares today to arrange follow up, repeat blood level measurement (hemoglobin level) and dietary recommendations.  With regard to aspirin, it is reasonable (given the indication you take this for in the first place) to hold this aspirin for now, not to take it, and to resume after the bleeding has stopped.   Increase activity slowly   Complete by: As directed      Allergies as of 09/21/2020   No Known Allergies     Medication List    STOP taking these medications   aspirin EC 81 MG tablet   meloxicam 15 MG tablet Commonly known as: MOBIC   metroNIDAZOLE 500 MG tablet Commonly known as: FLAGYL   omeprazole 40 MG capsule Commonly known as: PRILOSEC     TAKE these medications   azelastine 0.1 % nasal spray Commonly known as: ASTELIN Place 1 spray into both nostrils daily as needed for allergies.   citalopram 20 MG tablet Commonly known as: CELEXA TAKE 1 TABLET BY MOUTH ONCE DAILY   diltiazem 30 MG tablet Commonly known as: Cardizem Take 1 Tablet Every 4 Hours As Needed For HR >100   famotidine 20 MG tablet Commonly known as: PEPCID Take 20 mg by mouth as needed for heartburn or indigestion.   Fish Oil 1000 MG Caps Take 1,000 mg by mouth daily.   metoprolol succinate 25 MG 24 hr tablet Commonly known as: TOPROL-XL Take 1 tablet (25 mg  total) by mouth daily.   montelukast 10 MG tablet Commonly known as: SINGULAIR Take 10 mg by mouth as needed (allergies).   Multi For Her 50+ Tabs Take 1 tablet by mouth daily.   OSTEO BI-FLEX ONE PER DAY PO Take 1 tablet by mouth daily.   PROBIOTIC PO Take 1 capsule by mouth daily. Called ultra spectrum   traMADol 50 MG tablet Commonly known as: ULTRAM Take 50 mg by mouth every 8 (eight) hours as needed for moderate pain.   vitamin C 1000 MG tablet Take 500 mg by mouth 3 (three) times a week.   zinc  gluconate 50 MG tablet Take 50 mg by mouth 3 (three) times a week.       Follow-up Information    Juanita Craver, MD. Call.   Specialty: Gastroenterology Contact information: [address], [place] [address] [phone]              No Known Allergies  Consultations:  GI   Procedures/Studies: DG ABD ACUTE 2+V W 1V CHEST  Result Date: 09/20/2020 CLINICAL DATA:  Abdominal pain EXAM: DG ABDOMEN ACUTE WITH 1 VIEW CHEST COMPARISON:  CT AP 05/31/2020 FINDINGS: There is no evidence of dilated bowel loops or free intraperitoneal air. Scattered nondilated, air-filled loops of small bowel are noted. No radiopaque calculi or other significant radiographic abnormality is seen. Heart size and mediastinal contours are within normal limits. Both lungs are clear. IMPRESSION: 1. Nonobstructive bowel gas pattern. 2. No acute cardiopulmonary abnormalities. Electronically Signed   By: Kerby Moors M.D.   On: 09/20/2020 16:39       Subjective: Maroon stool this morning.  No fever.  Mild RLQ pain.  Discharge Exam: Vitals:   09/20/20 1959 09/21/20 0553  BP: (!) 151/86 118/82  Pulse: (!) 52 62  Resp: 20 18  Temp: 97.9 F (36.6 C) 97.8 F (36.6 C)  SpO2: 99% 99%   Vitals:   09/20/20 1300 09/20/20 1716 09/20/20 1959 09/21/20 0553  BP: (!) 157/85  (!) 151/86 118/82  Pulse: (!) 59 60 (!) 52 62  Resp: 19  20 18   Temp: 98.3 F (36.8 C)  97.9 F (36.6 C) 97.8 F (36.6 C)  TempSrc:   Oral Oral  SpO2: 99%  99% 99%    General: Pt is alert, awake, not in acute distress Cardiovascular: RRR, nl S1-S2, no murmurs appreciated.   No LE edema.   Respiratory: Normal respiratory rate and rhythm.  CTAB without rales or wheezes. Abdominal: Abdomen soft.  No rigidity or rebound.  Mild RLQ tenderness with deep palpation.  No distension or HSM.   Neuro/Psych: Strength symmetric in upper and lower extremities.  Judgment and insight appear normal.   The results of significant  diagnostics from this hospitalization (including imaging, microbiology, ancillary and laboratory) are listed below for reference.     Microbiology: Recent Results (from the past 240 hour(s))  Resp Panel by RT-PCR (Flu A&B, Covid) Nasopharyngeal Swab     Status: None   Collection Time: 09/20/20  5:30 PM   Specimen: Nasopharyngeal Swab; Nasopharyngeal(NP) swabs in vial transport medium  Result Value Ref Range Status   SARS Coronavirus 2 by RT PCR NEGATIVE NEGATIVE Final    Comment: (NOTE) SARS-CoV-2 target nucleic acids are NOT DETECTED.  The SARS-CoV-2 RNA is generally detectable in upper respiratory specimens during the acute phase of infection. The lowest concentration of SARS-CoV-2 viral copies this assay can detect is 138 copies/mL. A negative result does not  preclude SARS-Cov-2 infection and should not be used as the sole basis for treatment or other patient management decisions. A negative result may occur with  improper specimen collection/handling, submission of specimen other than nasopharyngeal swab, presence of viral mutation(s) within the areas targeted by this assay, and inadequate number of viral copies(<138 copies/mL). A negative result must be combined with clinical observations, patient history, and epidemiological information. The expected result is Negative.  Fact Sheet for Patients:  EntrepreneurPulse.com.au  Fact Sheet for Healthcare Providers:  IncredibleEmployment.be  This test is no t yet approved or cleared by the [place] FDA and  has been authorized for detection and/or diagnosis of SARS-CoV-2 by FDA under an Emergency Use Authorization (EUA). This EUA will remain  in effect (meaning this test can be used) for the duration of the COVID-19 declaration under Section 564(b)(1) of the Act, 21 U.S.C.section 360bbb-3(b)(1), unless the authorization is terminated  or revoked sooner.       Influenza A by PCR NEGATIVE  NEGATIVE Final   Influenza B by PCR NEGATIVE NEGATIVE Final    Comment: (NOTE) The Xpert Xpress SARS-CoV-2/FLU/RSV plus assay is intended as an aid in the diagnosis of influenza from Nasopharyngeal swab specimens and should not be used as a sole basis for treatment. Nasal washings and aspirates are unacceptable for Xpert Xpress SARS-CoV-2/FLU/RSV testing.  Fact Sheet for Patients: EntrepreneurPulse.com.au  Fact Sheet for Healthcare Providers: IncredibleEmployment.be  This test is not yet approved or cleared by the [place] FDA and has been authorized for detection and/or diagnosis of SARS-CoV-2 by FDA under an Emergency Use Authorization (EUA). This EUA will remain in effect (meaning this test can be used) for the duration of the COVID-19 declaration under Section 564(b)(1) of the Act, 21 U.S.C. section 360bbb-3(b)(1), unless the authorization is terminated or revoked.  Performed at [facility], [place] [address], [place], [place] [address]      Labs: BNP (last 3 results) No results for input(s): BNP in the last 8760 hours. Basic Metabolic Panel: Recent Labs  Lab 09/20/20 1416 09/21/20 0118  NA 136 136  K 3.6 3.9  CL 102 102  CO2 24 21*  GLUCOSE 118* 84  BUN 10 6*  CREATININE 0.76 0.70  CALCIUM 9.4 9.5   Liver Function Tests: No results for input(s): AST, ALT, ALKPHOS, BILITOT, PROT, ALBUMIN in the last 168 hours. No results for input(s): LIPASE, AMYLASE in the last 168 hours. No results for input(s): AMMONIA in the last 168 hours. CBC: Recent Labs  Lab 09/20/20 1416 09/20/20 1723 09/21/20 0118  WBC 4.9 6.1 5.2  HGB 12.1 12.3 12.4  HCT 35.4* 37.4 37.8  MCV 97.0 97.7 97.7  PLT 297 279 298   Cardiac Enzymes: No results for input(s): CKTOTAL, CKMB, CKMBINDEX, TROPONINI in the last 168 hours. BNP: Invalid input(s): POCBNP CBG: No results for input(s): GLUCAP in the last 168 hours. D-Dimer No results for  input(s): DDIMER in the last 72 hours. Hgb A1c No results for input(s): HGBA1C in the last 72 hours. Lipid Profile No results for input(s): CHOL, HDL, LDLCALC, TRIG, CHOLHDL, LDLDIRECT in the last 72 hours. Thyroid function studies No results for input(s): TSH, T4TOTAL, T3FREE, THYROIDAB in the last 72 hours.  Invalid input(s): FREET3 Anemia work up No results for input(s): VITAMINB12, FOLATE, FERRITIN, TIBC, IRON, RETICCTPCT in the last 72 hours. Urinalysis    Component Value Date/Time   COLORURINE YELLOW 05/30/2020 1800   APPEARANCEUR CLEAR 05/30/2020 1800   LABSPEC 1.020 05/30/2020  1800   PHURINE 6.0 05/30/2020 1800   GLUCOSEU NEGATIVE 05/30/2020 1800   HGBUR SMALL (A) 05/30/2020 1800   BILIRUBINUR negative 08/30/2020 1020   BILIRUBINUR neg 12/20/2011 1354   KETONESUR negative 08/30/2020 1020   KETONESUR NEGATIVE 05/30/2020 1800   PROTEINUR negative 08/30/2020 1020   PROTEINUR NEGATIVE 05/30/2020 1800   UROBILINOGEN 0.2 08/30/2020 1020   UROBILINOGEN 0.2 02/26/2013 1732   NITRITE Negative 08/30/2020 1020   NITRITE NEGATIVE 05/30/2020 1800   LEUKOCYTESUR Negative 08/30/2020 1020   LEUKOCYTESUR NEGATIVE 05/30/2020 1800   Sepsis Labs Invalid input(s): PROCALCITONIN,  WBC,  LACTICIDVEN Microbiology Recent Results (from the past 240 hour(s))  Resp Panel by RT-PCR (Flu A&B, Covid) Nasopharyngeal Swab     Status: None   Collection Time: 09/20/20  5:30 PM   Specimen: Nasopharyngeal Swab; Nasopharyngeal(NP) swabs in vial transport medium  Result Value Ref Range Status   SARS Coronavirus 2 by RT PCR NEGATIVE NEGATIVE Final    Comment: (NOTE) SARS-CoV-2 target nucleic acids are NOT DETECTED.  The SARS-CoV-2 RNA is generally detectable in upper respiratory specimens during the acute phase of infection. The lowest concentration of SARS-CoV-2 viral copies this assay can detect is 138 copies/mL. A negative result does not preclude SARS-Cov-2 infection and should not be used as  the sole basis for treatment or other patient management decisions. A negative result may occur with  improper specimen collection/handling, submission of specimen other than nasopharyngeal swab, presence of viral mutation(s) within the areas targeted by this assay, and inadequate number of viral copies(<138 copies/mL). A negative result must be combined with clinical observations, patient history, and epidemiological information. The expected result is Negative.  Fact Sheet for Patients:  EntrepreneurPulse.com.au  Fact Sheet for Healthcare Providers:  IncredibleEmployment.be  This test is no t yet approved or cleared by the [place] FDA and  has been authorized for detection and/or diagnosis of SARS-CoV-2 by FDA under an Emergency Use Authorization (EUA). This EUA will remain  in effect (meaning this test can be used) for the duration of the COVID-19 declaration under Section 564(b)(1) of the Act, 21 U.S.C.section 360bbb-3(b)(1), unless the authorization is terminated  or revoked sooner.       Influenza A by PCR NEGATIVE NEGATIVE Final   Influenza B by PCR NEGATIVE NEGATIVE Final    Comment: (NOTE) The Xpert Xpress SARS-CoV-2/FLU/RSV plus assay is intended as an aid in the diagnosis of influenza from Nasopharyngeal swab specimens and should not be used as a sole basis for treatment. Nasal washings and aspirates are unacceptable for Xpert Xpress SARS-CoV-2/FLU/RSV testing.  Fact Sheet for Patients: EntrepreneurPulse.com.au  Fact Sheet for Healthcare Providers: IncredibleEmployment.be  This test is not yet approved or cleared by the [place] FDA and has been authorized for detection and/or diagnosis of SARS-CoV-2 by FDA under an Emergency Use Authorization (EUA). This EUA will remain in effect (meaning this test can be used) for the duration of the COVID-19 declaration under Section 564(b)(1) of  the Act, 21 U.S.C. section 360bbb-3(b)(1), unless the authorization is terminated or revoked.  Performed at [facility], [place] [address] [place], [place] [address]      Time coordinating discharge: 25 minutes The [place] controlled substances registry was reviewed for this patient        SIGNED:   Edwin Dada, MD  [facility] 09/21/2020, 8:35 AM

## 2020-09-21 NOTE — Plan of Care (Signed)
  Problem: Education: Goal: Knowledge of General Education information will improve Description: Including pain rating scale, medication(s)/side effects and non-pharmacologic comfort measures Outcome: Progressing   Problem: Health Behavior/Discharge Planning: Goal: Ability to manage health-related needs will improve Outcome: Progressing   Problem: Clinical Measurements: Goal: Ability to maintain clinical measurements within normal limits will improve Outcome: Progressing Goal: Will remain free from infection Outcome: Progressing Goal: Diagnostic test results will improve Outcome: Progressing   Problem: Activity: Goal: Risk for activity intolerance will decrease Outcome: Progressing   Problem: Coping: Goal: Level of anxiety will decrease Outcome: Progressing   Problem: Pain Managment: Goal: General experience of comfort will improve Outcome: Progressing   Problem: Safety: Goal: Ability to remain free from injury will improve Outcome: Progressing   Problem: Skin Integrity: Goal: Risk for impaired skin integrity will decrease Outcome: Progressing   Problem: Education: Goal: Ability to identify signs and symptoms of gastrointestinal bleeding will improve Outcome: Progressing   Problem: Bowel/Gastric: Goal: Will show no signs and symptoms of gastrointestinal bleeding Outcome: Progressing   Problem: Fluid Volume: Goal: Will show no signs and symptoms of excessive bleeding Outcome: Progressing   Problem: Clinical Measurements: Goal: Complications related to the disease process, condition or treatment will be avoided or minimized Outcome: Progressing

## 2020-09-21 NOTE — Progress Notes (Addendum)
Pt. Discharged in stable condition via car with family. AVS documentation given and explained.  

## 2020-09-21 NOTE — Plan of Care (Signed)
  Problem: Education: Goal: Knowledge of General Education information will improve Description: Including pain rating scale, medication(s)/side effects and non-pharmacologic comfort measures Outcome: Adequate for Discharge   

## 2020-09-28 ENCOUNTER — Other Ambulatory Visit: Payer: Self-pay | Admitting: Cardiovascular Disease

## 2020-09-28 MED FILL — METOPROLOL SUCCINATE ER 25: 25 | 90 days supply | Qty: 90 | Fill #0

## 2020-09-28 MED FILL — FAMOTIDINE 20 MG TABS: 20 | 45 days supply | Qty: 90 | Fill #0

## 2020-09-28 MED FILL — CITALOPRAM HBR 20 MG TABLET: 20 | 90 days supply | Qty: 90 | Fill #3

## 2020-09-28 MED FILL — MONTELUKAST SOD 10 MG TAB: 10 | 90 days supply | Qty: 90 | Fill #2

## 2020-10-21 DIAGNOSIS — H524 Presbyopia: Secondary | ICD-10-CM | POA: Diagnosis not present

## 2020-10-29 DIAGNOSIS — Z0000 Encounter for general adult medical examination without abnormal findings: Secondary | ICD-10-CM | POA: Diagnosis not present

## 2020-11-02 DIAGNOSIS — R000 Tachycardia, unspecified: Secondary | ICD-10-CM | POA: Diagnosis not present

## 2020-11-02 DIAGNOSIS — R5383 Other fatigue: Secondary | ICD-10-CM | POA: Diagnosis not present

## 2020-11-02 DIAGNOSIS — K219 Gastro-esophageal reflux disease without esophagitis: Secondary | ICD-10-CM | POA: Diagnosis not present

## 2020-11-02 DIAGNOSIS — Z0000 Encounter for general adult medical examination without abnormal findings: Secondary | ICD-10-CM | POA: Diagnosis not present

## 2020-11-02 DIAGNOSIS — Z1211 Encounter for screening for malignant neoplasm of colon: Secondary | ICD-10-CM | POA: Diagnosis not present

## 2020-11-02 DIAGNOSIS — I4891 Unspecified atrial fibrillation: Secondary | ICD-10-CM | POA: Diagnosis not present

## 2020-11-02 DIAGNOSIS — R030 Elevated blood-pressure reading, without diagnosis of hypertension: Secondary | ICD-10-CM | POA: Diagnosis not present

## 2020-11-03 ENCOUNTER — Other Ambulatory Visit (HOSPITAL_COMMUNITY): Payer: Self-pay | Admitting: Family Medicine

## 2020-11-03 MED FILL — OMEPRAZOLE DR 20 MG CAPSULE: 20 | 90 days supply | Qty: 90 | Fill #0

## 2020-12-08 ENCOUNTER — Other Ambulatory Visit: Payer: Self-pay

## 2020-12-08 ENCOUNTER — Encounter: Payer: Self-pay | Admitting: Family Medicine

## 2020-12-08 ENCOUNTER — Ambulatory Visit: Payer: 59 | Admitting: Family Medicine

## 2020-12-08 ENCOUNTER — Ambulatory Visit
Admission: RE | Admit: 2020-12-08 | Discharge: 2020-12-08 | Disposition: A | Discharge status: Home / Self Care | Payer: 59 | Source: Ambulatory Visit | Attending: Family Medicine | Admitting: Family Medicine

## 2020-12-08 VITALS — BP 122/78 | Ht 64.0 in | Wt 142.0 lb

## 2020-12-08 DIAGNOSIS — M25572 Pain in left ankle and joints of left foot: Secondary | ICD-10-CM

## 2020-12-08 DIAGNOSIS — M7732 Calcaneal spur, left foot: Secondary | ICD-10-CM | POA: Diagnosis not present

## 2020-12-08 NOTE — Progress Notes (Signed)
PCP: Fanny Bien, MD  Subjective:   HPI: Patient is a 65 y.o. female here for left foot pain.  Patient reports she was playing pickleball about 1 month ago when she inverted her left ankle. Immediate pain lateral left foot but no obvious swelling or bruising. Since then has had off and on pain in same area. Sometimes burning quality to the pain. Notices more when walking as well.  Past Medical History:  Diagnosis Date  . Abdominal pain, left upper quadrant   . Acute bronchospasm   . Acute neck pain 07/02/2019  . Acute sinusitis, unspecified 05/20/2009   Centricity Description: SINUSITIS - ACUTE-NOS Qualifier: Diagnosis of  By: Birdie Riddle MD, Belenda Cruise   Centricity Description: ACUTE SINUSITIS, UNSPECIFIED Qualifier: Diagnosis of  By: Alveta Heimlich MD, Cornelia Copa   Centricity Description: SINUSITIS, ACUTE Qualifier: Diagnosis of  By: Koleen Nimrod MD, Dellis Filbert    . Atypical chest pain 03/05/2013  . Body mass index (BMI) of 23.0-23.9 in adult   . Cervicalgia   . Colon polyp   . Costochondral junction syndrome   . Cough   . DEPRESSION 08/25/2008   Qualifier: Diagnosis of  By: Ronnald Ramp CMA, Chemira    . Diarrhea   . Diverticulosis 07/27/2012  . Dorsalgia   . Elevated blood pressure reading without diagnosis of hypertension   . Endometrial polyp 8/08   benign  . Essential hypertension 09/20/2020  . Family history of osteoporosis 12/28/2011   Mother pt had normal Dexa 11/2009   . Fatigue   . GERD (gastroesophageal reflux disease)   . HIP PAIN, RIGHT 04/12/2009   Qualifier: Diagnosis of  By: Oneida Alar MD, KARL    . History of hiatal hernia   . HYPERLIPIDEMIA 08/25/2008   Qualifier: Diagnosis of  By: Birdie Riddle MD, Belenda Cruise    . IBS (irritable bowel syndrome)   . Left hip pain 07/30/2013  . Left wrist pain 07/30/2013  . Lumbar strain, initial encounter 07/28/2019  . LUNG NODULE 11/19/2009   Neg CT    . 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 Menopause   . Migraine   . NEUTROPENIA  UNSPECIFIED 10/07/2008   Qualifier: Diagnosis of  By: Birdie Riddle MD, Belenda Cruise    . NONSPCIFC ABN FINDING RAD & OTH EXAM LUNG FIELD 11/19/2009   Qualifier: Diagnosis of  By: Alveta Heimlich MD, Cornelia Copa    . Nonspecific (abnormal) findings on radiological and other examination of body structure 11/19/2009   Qualifier: Diagnosis of By: Alveta Heimlich MD, Bennie Dallas list entry automatically replaced. Please review for accuracy.  . Otalgia, unspecified ear   . Pain in throat   . Palpitations   . Paroxysmal atrial fibrillation (HCC)   . Peroneal tendinitis of right lower extremity 03/12/2014  . Plantar wart 04/02/2014  . Pneumonia   . RHINITIS 06/21/2009   Qualifier: Diagnosis of  By: Birdie Riddle MD, Belenda Cruise    . Right ankle injury, subsequent encounter 01/11/2018  . Right foot pain 08/09/2011  . Sinusitis, chronic   . Skene's gland abscess 2009   patient unaware  . Sprain of unspecified ligament of right ankle, subsequent encounter   . TRANSAMINASES, SERUM, ELEVATED 12/07/2009   Qualifier: Diagnosis of  By: Inda Castle FNP, Wellington Hampshire   . Unspecified cataract   . Urinary tract infection, site not specified   . Vitamin D deficiency     Current Outpatient Medications on File Prior to Visit  Medication Sig Dispense Refill  . Ascorbic Acid (VITAMIN C) 1000 MG tablet Take 500  mg by mouth 3 (three) times a week.    Marland Kitchen azelastine (ASTELIN) 0.1 % nasal spray Place 1 spray into both nostrils daily as needed for allergies.    . [place]-Glucosamine-Vit D (OSTEO BI-FLEX ONE PER DAY PO) Take 1 tablet by mouth daily.    . citalopram (CELEXA) 20 MG tablet TAKE 1 TABLET BY MOUTH ONCE DAILY (Patient taking differently: Take 20 mg by mouth daily.) 90 tablet 3  . diltiazem (CARDIZEM) 30 MG tablet Take 1 Tablet Every 4 Hours As Needed For HR >100 45 tablet 3  . famotidine (PEPCID) 20 MG tablet Take 20 mg by mouth as needed for heartburn or indigestion.    . metoprolol succinate (TOPROL-XL) 25 MG 24 hr tablet TAKE 1 TABLET (25 MG TOTAL) BY  MOUTH DAILY. 90 tablet 1  . montelukast (SINGULAIR) 10 MG tablet Take 10 mg by mouth as needed (allergies).    . Multiple Vitamins-Minerals (MULTI FOR HER 50+) TABS Take 1 tablet by mouth daily.    . Omega-3 Fatty Acids (FISH OIL) 1000 MG CAPS Take 1,000 mg by mouth daily.    Marland Kitchen omeprazole (PRILOSEC) 20 MG capsule Take 20 mg by mouth every morning.    . Probiotic Product (PROBIOTIC PO) Take 1 capsule by mouth daily. Called ultra spectrum    . traMADol (ULTRAM) 50 MG tablet Take 50 mg by mouth every 8 (eight) hours as needed for moderate pain.    Marland Kitchen zinc gluconate 50 MG tablet Take 50 mg by mouth 3 (three) times a week.     No current facility-administered medications on file prior to visit.    Past Surgical History:  Procedure Laterality Date  . COLONOSCOPY    . DILATION AND CURETTAGE OF UTERUS  8/08  . GYNECOLOGIC CRYOSURGERY  1980  . HYSTEROSCOPY WITH D & C N/A 03/30/2017   Procedure: DILATATION & CURETTAGE/HYSTEROSCOPY WITH ULTRASOUND GUIDANCE;  Surgeon: Megan Salon, MD;  Location: [facility];  Service: Gynecology;  Laterality: N/A;  . UPPER GI ENDOSCOPY    . uterine ablation  2007    No Known Allergies  Social History   Socioeconomic History  . Marital status: Single    Spouse name: Not on file  . Number of children: 0  . Years of education: Not on file  . Highest education level: Not on file  Occupational History  . Occupation: Programmer, multimedia: [employer]  Tobacco Use  . Smoking status: Former Smoker    Packs/day: 0.10    Years: 3.00    Pack years: 0.30    Types: Cigarettes    Quit date: 10/02/1978    Years since quitting: 42.2  . Smokeless tobacco: Never Used  Vaping Use  . Vaping Use: Never used  Substance and Sexual Activity  . Alcohol use: Yes    Alcohol/week: 7.0 standard drinks    Types: 7 Glasses of wine per week    Comment: per week  . Drug use: No  . Sexual activity: Not Currently    Birth control/protection: Post-menopausal  Other Topics Concern  . Not  on file  Social History Narrative  . Not on file   Social Determinants of Health   Financial Resource Strain: Not on file  Food Insecurity: Not on file  Transportation Needs: Not on file  Physical Activity: Not on file  Stress: Not on file  Social Connections: Not on file  Intimate Partner Violence: Not on file    Family History  Problem Relation  Age of Onset  . Colon polyps Father   . Ulcerative colitis Father   . CAD Father        Stent placement at age 12  . Glaucoma Father   . Hypertension Father   . Colitis Father   . Breast cancer Paternal Grandmother   . Colon cancer Maternal Grandfather   . Colon polyps Mother   . Osteoporosis Mother   . Thyroid disease Mother        hypo  . Glaucoma Mother   . Diverticulosis Mother   . Sudden death Neg Hx   . Hyperlipidemia Neg Hx   . Heart attack Neg Hx   . Diabetes Neg Hx     BP 122/78   Ht 5\' 4"  (1.626 m)   Wt 142 lb (64.4 kg)   LMP 10/02/2006 (Approximate)   BMI 24.37 kg/m   [facility] Adult Exercise 08/30/2020  Frequency of aerobic exercise (# of days/week) 7  Average time in minutes 60  Frequency of strengthening activities (# of days/week) 3    No flowsheet data found.  Review of Systems: See HPI above.     Objective:  Physical Exam:  Gen: NAD, comfortable in exam room  Left foot/ankle: No gross deformity, swelling, ecchymoses FROM No TTP. Negative ant drawer and negative talar tilt.   Negative syndesmotic compression. Negative metatarsal squeeze. Thompsons test negative. NV intact distally.   Limited msk u/s left foot: no cortical irregularity, edema overlying cortex of 5th metatarsal.  Peroneus brevis intact without abnormalities.  Assessment & Plan:  1. Left foot pain - will obtain radiographs to further assess.  Ultrasound is reassuring.  Suspect she strained peroneus brevis and has some lingering pain with some localized nerve irritation.  If normal will start home exercise  program, icing, topical medications.  F/u in 3-4 weeks if not improving.

## 2020-12-08 NOTE — Patient Instructions (Signed)
Your ultrasound is reassuring. Get x-rays after you leave today of your foot - we will call you with the results. If these are normal start peroneal tendon rehab as directed. Icing 15 minutes at a time 3-4 times a day as needed. Topical medications if needed as you have been. Follow up with me in 3-4 weeks if not improving as expected.

## 2020-12-28 ENCOUNTER — Other Ambulatory Visit (HOSPITAL_COMMUNITY): Payer: Self-pay | Admitting: Family Medicine

## 2020-12-28 DIAGNOSIS — G4700 Insomnia, unspecified: Secondary | ICD-10-CM | POA: Diagnosis not present

## 2020-12-28 DIAGNOSIS — I10 Essential (primary) hypertension: Secondary | ICD-10-CM | POA: Diagnosis not present

## 2020-12-28 DIAGNOSIS — K219 Gastro-esophageal reflux disease without esophagitis: Secondary | ICD-10-CM | POA: Diagnosis not present

## 2020-12-28 DIAGNOSIS — F331 Major depressive disorder, recurrent, moderate: Secondary | ICD-10-CM | POA: Diagnosis not present

## 2020-12-28 DIAGNOSIS — F411 Generalized anxiety disorder: Secondary | ICD-10-CM | POA: Diagnosis not present

## 2020-12-28 MED FILL — CITALOPRAM HBR 20 MG TABLET: 20 | 90 days supply | Qty: 135 | Fill #0

## 2020-12-28 MED FILL — dilTIAZem HCL 30 MG TABS: 30 | 22 days supply | Qty: 45 | Fill #0

## 2020-12-28 MED FILL — MONTELUKAST SOD 10 MG TAB: 10 | 90 days supply | Qty: 90 | Fill #0

## 2020-12-28 MED FILL — METOPROLOL SUCCINATE ER 25: 25 | 90 days supply | Qty: 90 | Fill #0

## 2021-01-11 DIAGNOSIS — J069 Acute upper respiratory infection, unspecified: Secondary | ICD-10-CM | POA: Diagnosis not present

## 2021-01-12 ENCOUNTER — Other Ambulatory Visit: Payer: Self-pay

## 2021-01-12 ENCOUNTER — Emergency Department
Admission: RE | Admit: 2021-01-12 | Discharge: 2021-01-12 | Disposition: A | Discharge status: Home / Self Care | Payer: 59 | Source: Ambulatory Visit | Attending: Family Medicine | Admitting: Family Medicine

## 2021-01-12 ENCOUNTER — Other Ambulatory Visit (HOSPITAL_COMMUNITY): Payer: Self-pay

## 2021-01-12 VITALS — BP 130/84 | HR 67 | Temp 99.0°F | Resp 16

## 2021-01-12 DIAGNOSIS — J302 Other seasonal allergic rhinitis: Secondary | ICD-10-CM

## 2021-01-12 DIAGNOSIS — J069 Acute upper respiratory infection, unspecified: Secondary | ICD-10-CM

## 2021-01-12 MED ORDER — PREDNISONE 20 MG PO TABS
20.0000 mg | ORAL_TABLET | Freq: Two times a day (BID) | ORAL | 0 refills | Status: DC
Start: 2021-01-12 — End: 2021-03-09
  Filled 2021-01-12: qty 10, 5d supply, fill #0

## 2021-01-12 MED ORDER — AMOXICILLIN-POT CLAVULANATE 875-125 MG PO TABS
1.0000 | ORAL_TABLET | Freq: Two times a day (BID) | ORAL | 0 refills | Status: DC
  Filled 2021-01-12: qty 20, 10d supply, fill #0

## 2021-01-12 MED ORDER — AZITHROMYCIN 250 MG PO TABS
ORAL_TABLET | ORAL | 0 refills | Status: DC
  Filled 2021-01-12: qty 6, 5d supply, fill #0

## 2021-01-12 MED ORDER — BENZONATATE 200 MG PO CAPS
200.0000 mg | ORAL_CAPSULE | Freq: Two times a day (BID) | ORAL | 0 refills | Status: DC | PRN
  Filled 2021-01-12: qty 20, 10d supply, fill #0

## 2021-01-12 NOTE — Discharge Instructions (Addendum)
Drink plenty of fluids Take the Tessalon 2 times a day for cough Take prednisone 2 times a day for allergy symptoms.  Start today, take the first dose now than the second dose with your evening meal Expect improvement in a couple days If you fail to see improvement by 7 to 10 days, or if you have worsening symptoms then you should fill and take the antibiotic

## 2021-01-12 NOTE — ED Provider Notes (Addendum)
[facility]    CSN: [id number] Arrival date & time: 01/12/21  1041      History   Chief Complaint Chief Complaint  Patient presents with  . Nasal Congestion    HPI Kara Warren is a 65 y.o. female.   HPI   Patient has nasal congestion ear pressure and pain and mild sore throat for the last few days.  She also has some hoarse voice and laryngitis.  She has been taking Tylenol for low-grade fever.  Dry cough.  Minimal headache, body aches.  No sweats chills or fever.  Patient is fully vaccinated. No known exposure to illness   Past Medical History:  Diagnosis Date  . Abdominal pain, left upper quadrant   . Acute bronchospasm   . Acute neck pain 07/02/2019  . Acute sinusitis, unspecified 05/20/2009   Centricity Description: SINUSITIS - ACUTE-NOS Qualifier: Diagnosis of  By: Birdie Riddle MD, Belenda Cruise   Centricity Description: ACUTE SINUSITIS, UNSPECIFIED Qualifier: Diagnosis of  By: Alveta Heimlich MD, Cornelia Copa   Centricity Description: SINUSITIS, ACUTE Qualifier: Diagnosis of  By: Koleen Nimrod MD, Dellis Filbert    . Atypical chest pain 03/05/2013  . Body mass index (BMI) of 23.0-23.9 in adult   . Cervicalgia   . Colon polyp   . Costochondral junction syndrome   . Cough   . DEPRESSION 08/25/2008   Qualifier: Diagnosis of  By: Ronnald Ramp CMA, Chemira    . Diarrhea   . Diverticulosis 07/27/2012  . Dorsalgia   . Elevated blood pressure reading without diagnosis of hypertension   . Endometrial polyp 8/08   benign  . Essential hypertension 09/20/2020  . Family history of osteoporosis 12/28/2011   Mother pt had normal Dexa 11/2009   . Fatigue   . GERD (gastroesophageal reflux disease)   . HIP PAIN, RIGHT 04/12/2009   Qualifier: Diagnosis of  By: Oneida Alar MD, KARL    . History of hiatal hernia   . HYPERLIPIDEMIA 08/25/2008   Qualifier: Diagnosis of  By: Birdie Riddle MD, Belenda Cruise    . IBS (irritable bowel syndrome)   . Left hip pain 07/30/2013  . Left wrist pain 07/30/2013  . Lumbar strain, initial  encounter 07/28/2019  . LUNG NODULE 11/19/2009   Neg CT    . 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 Menopause   . Migraine   . NEUTROPENIA UNSPECIFIED 10/07/2008   Qualifier: Diagnosis of  By: Birdie Riddle MD, Belenda Cruise    . NONSPCIFC ABN FINDING RAD & OTH EXAM LUNG FIELD 11/19/2009   Qualifier: Diagnosis of  By: Alveta Heimlich MD, Cornelia Copa    . Nonspecific (abnormal) findings on radiological and other examination of body structure 11/19/2009   Qualifier: Diagnosis of By: Alveta Heimlich MD, Bennie Dallas list entry automatically replaced. Please review for accuracy.  . Otalgia, unspecified ear   . Pain in throat   . Palpitations   . Paroxysmal atrial fibrillation (HCC)   . Peroneal tendinitis of right lower extremity 03/12/2014  . Plantar wart 04/02/2014  . Pneumonia   . RHINITIS 06/21/2009   Qualifier: Diagnosis of  By: Birdie Riddle MD, Belenda Cruise    . Right ankle injury, subsequent encounter 01/11/2018  . Right foot pain 08/09/2011  . Sinusitis, chronic   . Skene's gland abscess 2009   patient unaware  . Sprain of unspecified ligament of right ankle, subsequent encounter   . TRANSAMINASES, SERUM, ELEVATED 12/07/2009   Qualifier: Diagnosis of  By: Inda Castle FNP, Wellington Hampshire   . Unspecified cataract   .  Urinary tract infection, site not specified   . Vitamin D deficiency     Patient Active Problem List   Diagnosis Date Noted  . Colonoscopy causing post-procedural bleeding 09/20/2020  . Essential hypertension 09/20/2020  . AF (paroxysmal atrial fibrillation) ([place]) 09/20/2020  . Plantar wart 04/02/2014  . Peroneal tendinitis of right lower extremity 03/12/2014  . Diverticulosis 07/27/2012  . Sinusitis, chronic   . Colon polyp   . Menopause   . Migraine   . [place] LUNG FIELD 11/19/2009  . HIP PAIN, RIGHT 04/12/2009  . HYPERLIPIDEMIA 08/25/2008  . Depression 08/25/2008    Past Surgical History:  Procedure Laterality Date  . COLONOSCOPY    . DILATION AND  CURETTAGE OF UTERUS  8/08  . GYNECOLOGIC CRYOSURGERY  1980  . HYSTEROSCOPY WITH D & C N/A 03/30/2017   Procedure: DILATATION & CURETTAGE/HYSTEROSCOPY WITH ULTRASOUND GUIDANCE;  Surgeon: Megan Salon, MD;  Location: [facility];  Service: Gynecology;  Laterality: N/A;  . UPPER GI ENDOSCOPY    . uterine ablation  2007    OB History    Gravida  0   Para  0   Term  0   Preterm  0   AB  0   Living  0     SAB  0   IAB  0   Ectopic  0   Multiple  0   Live Births  0            Home Medications    Prior to Admission medications   Medication Sig Start Date End Date Taking? Authorizing Provider  azithromycin (ZITHROMAX Z-PAK) 250 MG tablet Take 2 tablets by mouth today followed by 1 daily  until gone. 01/12/21  Yes Raylene Everts, MD  benzonatate (TESSALON) 200 MG capsule Take 1 capsule (200 mg total) by mouth 2 (two) times daily as needed for cough. 01/12/21  Yes Raylene Everts, MD  predniSONE (DELTASONE) 20 MG tablet Take 1 tablet (20 mg total) by mouth 2 (two) times daily with a meal. 01/12/21  Yes Raylene Everts, MD  amoxicillin-clavulanate (AUGMENTIN) 875-125 MG tablet take 1 tablet  by mouth 2 times a day for 10 days 01/12/21     Ascorbic Acid (VITAMIN C) 1000 MG tablet Take 500 mg by mouth 3 (three) times a week.    [provider]  azelastine (ASTELIN) 0.1 % nasal spray Place 1 spray into both nostrils daily as needed for allergies. 02/04/19   [provider]  Boswellia-Glucosamine-Vit D (OSTEO BI-FLEX ONE PER DAY PO) Take 1 tablet by mouth daily.    [provider]  citalopram (CELEXA) 20 MG tablet TAKE 1 & 1/2 TABLET BY MOUTH ONCE DAILY 12/28/20 12/28/21  Fanny Bien, MD  diltiazem (CARDIZEM) 30 MG tablet TAKE 1 TABLET BY MOUTH 2 TIMES A DAY IF NEEDED FOR AFIB 12/28/20 12/28/21  Fanny Bien, MD  famotidine (PEPCID) 20 MG tablet Take 20 mg by mouth as needed for heartburn or indigestion.    [provider]  famotidine  (PEPCID) 20 MG tablet TAKE 1 TABLET BY MOUTH 1 TO 2 TIMES DAILY WHEN NEEDED 05/05/20 05/05/21  Fanny Bien, MD  metoprolol succinate (TOPROL-XL) 25 MG 24 hr tablet TAKE 1 TABLET BY MOUTH DAILY 12/28/20 12/28/21  Fanny Bien, MD  montelukast (SINGULAIR) 10 MG tablet TAKE 1 TABLET (10 MG) BY MOUTH DAILY 12/28/20 12/28/21  Fanny Bien, MD  Multiple Vitamins-Minerals [facility]  FOR HER 50+) TABS Take 1 tablet by mouth daily.    [provider]  Omega-3 Fatty Acids (FISH OIL) 1000 MG CAPS Take 1,000 mg by mouth daily.    [provider]  omeprazole (PRILOSEC) 20 MG capsule TAKE 1 CAPSULE BY MOUTH 30 MINUTES BEFORE MORNING MEAL 12/28/20 12/28/21  Fanny Bien, MD  Probiotic Product (PROBIOTIC PO) Take 1 capsule by mouth daily. Called ultra spectrum    [provider]  Sod Picosulfate-Mag Ox-Cit Acd 10-3.5-12 MG-GM -GM/160ML SOLN TAKE AS DIRECTED 07/06/20 07/06/21  Juanita Craver, MD  traMADol (ULTRAM) 50 MG tablet TAKE 1 TABLET BY MOUTH EVERY 8 HOURS FOR 7 DAYS 08/30/20 02/26/21  Juanita Craver, MD  zinc gluconate 50 MG tablet Take 50 mg by mouth 3 (three) times a week.    [provider]    Family History Family History  Problem Relation Age of Onset  . Colon polyps Father   . Ulcerative colitis Father   . CAD Father        Stent placement at age 79  . Glaucoma Father   . Hypertension Father   . Colitis Father   . Breast cancer Paternal Grandmother   . Colon cancer Maternal Grandfather   . Colon polyps Mother   . Osteoporosis Mother   . Thyroid disease Mother        hypo  . Glaucoma Mother   . Diverticulosis Mother   . Sudden death Neg Hx   . Hyperlipidemia Neg Hx   . Heart attack Neg Hx   . Diabetes Neg Hx     Social History Social History   Tobacco Use  . Smoking status: Former Smoker    Packs/day: 0.10    Years: 3.00    Pack years: 0.30    Types: Cigarettes    Quit date: 10/02/1978    Years since quitting: 42.3  . Smokeless tobacco:  Never Used  Vaping Use  . Vaping Use: Never used  Substance Use Topics  . Alcohol use: Yes    Alcohol/week: 7.0 standard drinks    Types: 7 Glasses of wine per week    Comment: per week  . Drug use: No     Allergies   Patient has no known allergies.   Review of Systems Review of Systems  See HPI Physical Exam Triage Vital Signs ED Triage Vitals  Enc Vitals Group     BP 01/12/21 1101 130/84     Pulse Rate 01/12/21 1101 67     Resp 01/12/21 1101 16     Temp 01/12/21 1101 99 F (37.2 C)     Temp Source 01/12/21 1101 Oral     SpO2 01/12/21 1101 99 %     Weight --      Height --      Head Circumference --      Peak Flow --      Pain Score 01/12/21 1058 1     Pain Loc --      Pain Edu? --      Excl. in [place]? --    No data found.  Updated Vital Signs BP 130/84 (BP Location: Right Arm)   Pulse 67   Temp 99 F (37.2 C) (Oral)   Resp 16   LMP 10/02/2006 (Approximate)   SpO2 99%   r:     Physical Exam Constitutional:      General: She is not in acute distress.    Appearance: She is well-developed and  normal weight.  HENT:     Head: Normocephalic and atraumatic.     Right Ear: Tympanic membrane and ear canal normal.     Left Ear: Tympanic membrane and ear canal normal.     Nose: Rhinorrhea present. No congestion.     Comments: Clear rhinorrhea    Mouth/Throat:     Mouth: Mucous membranes are moist.     Pharynx: No posterior oropharyngeal erythema.  Eyes:     Conjunctiva/sclera: Conjunctivae normal.     Pupils: Pupils are equal, round, and reactive to light.  Cardiovascular:     Rate and Rhythm: Normal rate and regular rhythm.     Heart sounds: Normal heart sounds.  Pulmonary:     Effort: Pulmonary effort is normal. No respiratory distress.     Breath sounds: Normal breath sounds.  Abdominal:     General: There is no distension.     Palpations: Abdomen is soft.  Musculoskeletal:        General: Normal range of motion.     Cervical back: Normal range of  motion.  Skin:    General: Skin is warm and dry.  Neurological:     Mental Status: She is alert.  Psychiatric:        Behavior: Behavior normal.      UC Treatments / Results  Labs (all labs ordered are listed, but only abnormal results are displayed) Labs Reviewed - No data to display  EKG   Radiology No results found.  Procedures Procedures (including critical care time)  Medications Ordered in UC Medications - No data to display  Initial Impression / Assessment and Plan / UC Course  I have reviewed the triage vital signs and the nursing notes.  Pertinent labs & imaging results that were available during my care of the patient were reviewed by me and considered in my medical decision making (see chart for details).     *Explained viral illness to patient.  She feels like because she started running a fever and has purulent sputum she needs an antibiotic.  I explained to her that these are both very common findings in respiratory viruses.  Patient is an Therapist, sports.  I explained to her that if she fails to improve with conservative treatment, an antibiotic can be used a day 7-10 if needed Final Clinical Impressions(s) / UC Diagnoses   Final diagnoses:  Seasonal allergies  Viral URI with cough     Discharge Instructions     Drink plenty of fluids Take the Tessalon 2 times a day for cough Take prednisone 2 times a day for allergy symptoms.  Start today, take the first dose now than the second dose with your evening meal Expect improvement in a couple days If you fail to see improvement by 7 to 10 days, or if you have worsening symptoms then you should fill and take the antibiotic    ED Prescriptions    Medication Sig Dispense Auth. Provider   benzonatate (TESSALON) 200 MG capsule Take 1 capsule (200 mg total) by mouth 2 (two) times daily as needed for cough. 20 capsule Raylene Everts, MD   predniSONE (DELTASONE) 20 MG tablet Take 1 tablet (20 mg total) by mouth 2  (two) times daily with a meal. 10 tablet Raylene Everts, MD   azithromycin (ZITHROMAX Z-PAK) 250 MG tablet Take 2 tablets by mouth today followed by 1 daily  until gone. 6 tablet Raylene Everts, MD     PDMP not reviewed  this encounter.   Raylene Everts, MD 01/12/21 1525    Raylene Everts, MD 01/12/21 [phone]

## 2021-01-12 NOTE — ED Triage Notes (Signed)
Patient presents to Urgent Care with complaints of nasal congestion and ear pressure since a few days ago. Patient reports she is worried she may have laryngitis, has been taking tylenol for a low grade fever this morning. Has a dry cough and feels she has to strain to speak.

## 2021-01-13 ENCOUNTER — Other Ambulatory Visit (HOSPITAL_COMMUNITY): Payer: Self-pay

## 2021-01-14 ENCOUNTER — Other Ambulatory Visit (HOSPITAL_COMMUNITY): Payer: Self-pay

## 2021-01-25 ENCOUNTER — Other Ambulatory Visit (HOSPITAL_COMMUNITY): Payer: Self-pay

## 2021-01-25 MED FILL — Omeprazole Cap Delayed Release 20 MG: ORAL | 90 days supply | Qty: 90 | Fill #0 | Status: AC

## 2021-02-02 DIAGNOSIS — L738 Other specified follicular disorders: Secondary | ICD-10-CM | POA: Diagnosis not present

## 2021-02-02 DIAGNOSIS — L819 Disorder of pigmentation, unspecified: Secondary | ICD-10-CM | POA: Diagnosis not present

## 2021-02-02 DIAGNOSIS — B078 Other viral warts: Secondary | ICD-10-CM | POA: Diagnosis not present

## 2021-02-02 DIAGNOSIS — L812 Freckles: Secondary | ICD-10-CM | POA: Diagnosis not present

## 2021-02-11 ENCOUNTER — Ambulatory Visit: Payer: 59 | Attending: Internal Medicine

## 2021-02-11 ENCOUNTER — Other Ambulatory Visit (HOSPITAL_BASED_OUTPATIENT_CLINIC_OR_DEPARTMENT_OTHER): Payer: Self-pay

## 2021-02-11 ENCOUNTER — Other Ambulatory Visit: Payer: Self-pay

## 2021-02-11 DIAGNOSIS — Z23 Encounter for immunization: Secondary | ICD-10-CM

## 2021-02-11 MED ORDER — PFIZER-BIONT COVID-19 VAC-TRIS 30 MCG/0.3ML IM SUSP
INTRAMUSCULAR | 0 refills | Status: DC
  Filled 2021-02-11: qty 0.3, 1d supply, fill #0

## 2021-02-11 NOTE — Progress Notes (Signed)
   Covid-19 Vaccination Clinic  Name:  Kara Warren    MRN: [id number] DOB: [birth year]  02/11/2021  [name] was observed post Covid-19 immunization for 15 minutes without incident. She was provided with Vaccine Information Sheet and instruction to access the V-Safe system.   [name] was instructed to call 911 with any severe reactions post vaccine: Marland Kitchen Difficulty breathing  . Swelling of face and throat  . A fast heartbeat  . A bad rash all over body  . Dizziness and weakness   Immunizations Administered    Name Date Dose VIS Date Route   PFIZER Comrnaty(Gray TOP) Covid-19 Vaccine 02/11/2021 12:03 PM 0.3 mL 09/09/2020 Intramuscular   Manufacturer: [place]   Lot: [id number]   NDC: [phone]

## 2021-02-22 ENCOUNTER — Other Ambulatory Visit (HOSPITAL_COMMUNITY): Payer: Self-pay

## 2021-02-22 MED FILL — Famotidine Tab 20 MG: ORAL | 90 days supply | Qty: 180 | Fill #0 | Status: AC

## 2021-03-09 ENCOUNTER — Emergency Department
Admission: EM | Admit: 2021-03-09 | Discharge: 2021-03-09 | Disposition: A | Discharge status: Home / Self Care | Payer: 59 | Source: Home / Self Care

## 2021-03-09 ENCOUNTER — Other Ambulatory Visit (HOSPITAL_COMMUNITY): Payer: Self-pay

## 2021-03-09 ENCOUNTER — Encounter: Payer: Self-pay | Admitting: *Deleted

## 2021-03-09 ENCOUNTER — Other Ambulatory Visit: Payer: Self-pay

## 2021-03-09 DIAGNOSIS — J029 Acute pharyngitis, unspecified: Secondary | ICD-10-CM | POA: Diagnosis not present

## 2021-03-09 LAB — POCT RAPID STREP A (OFFICE): Rapid Strep A Screen: NEGATIVE

## 2021-03-09 MED ORDER — SUCRALFATE 1 GM/10ML PO SUSP
1.0000 g | Freq: Three times a day (TID) | ORAL | 0 refills | Status: DC
  Filled 2021-03-09: qty 420, 11d supply, fill #0

## 2021-03-09 MED ORDER — CARESTART COVID-19 HOME TEST VI KIT
PACK | 0 refills | Status: DC
  Filled 2021-03-09: qty 4, 4d supply, fill #0

## 2021-03-09 NOTE — ED Provider Notes (Signed)
[facility]  ____________________________________________  Time seen: Approximately 11:38 AM  I have reviewed the triage vital signs and the nursing notes.   HISTORY  Chief Complaint Sore Throat   Historian Patient    HPI Kara Warren is a 65 y.o. female presents to the emergency department with pharyngitis for the past 2 days.  Patient denies fever, difficulty speaking, difficulty swallowing, headache, body aches, vomiting or diarrhea.  Patient has numerous potential sick contacts.  No chest pain or abdominal pain.  No recent travel.  No other alleviating measures have been attempted.   Past Medical History:  Diagnosis Date  . Abdominal pain, left upper quadrant   . Acute bronchospasm   . Acute neck pain 07/02/2019  . Acute sinusitis, unspecified 05/20/2009   Centricity Description: SINUSITIS - ACUTE-NOS Qualifier: Diagnosis of  By: Birdie Riddle MD, Belenda Cruise   Centricity Description: ACUTE SINUSITIS, UNSPECIFIED Qualifier: Diagnosis of  By: Alveta Heimlich MD, Cornelia Copa   Centricity Description: SINUSITIS, ACUTE Qualifier: Diagnosis of  By: Koleen Nimrod MD, Dellis Filbert    . Atypical chest pain 03/05/2013  . Body mass index (BMI) of 23.0-23.9 in adult   . Cervicalgia   . Colon polyp   . Costochondral junction syndrome   . Cough   . DEPRESSION 08/25/2008   Qualifier: Diagnosis of  By: Ronnald Ramp CMA, Chemira    . Diarrhea   . Diverticulosis 07/27/2012  . Dorsalgia   . Elevated blood pressure reading without diagnosis of hypertension   . Endometrial polyp 8/08   benign  . Essential hypertension 09/20/2020  . Family history of osteoporosis 12/28/2011   Mother pt had normal Dexa 11/2009   . Fatigue   . GERD (gastroesophageal reflux disease)   . HIP PAIN, RIGHT 04/12/2009   Qualifier: Diagnosis of  By: Oneida Alar MD, KARL    . History of hiatal hernia   . HYPERLIPIDEMIA 08/25/2008   Qualifier: Diagnosis of  By: Birdie Riddle MD, Belenda Cruise    . IBS (irritable bowel syndrome)   . Left hip pain  07/30/2013  . Left wrist pain 07/30/2013  . Lumbar strain, initial encounter 07/28/2019  . LUNG NODULE 11/19/2009   Neg CT    . 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 Menopause   . Migraine   . NEUTROPENIA UNSPECIFIED 10/07/2008   Qualifier: Diagnosis of  By: Birdie Riddle MD, Belenda Cruise    . NONSPCIFC ABN FINDING RAD & OTH EXAM LUNG FIELD 11/19/2009   Qualifier: Diagnosis of  By: Alveta Heimlich MD, Cornelia Copa    . Nonspecific (abnormal) findings on radiological and other examination of body structure 11/19/2009   Qualifier: Diagnosis of By: Alveta Heimlich MD, Bennie Dallas list entry automatically replaced. Please review for accuracy.  . Otalgia, unspecified ear   . Pain in throat   . Palpitations   . Paroxysmal atrial fibrillation (HCC)   . Peroneal tendinitis of right lower extremity 03/12/2014  . Plantar wart 04/02/2014  . Pneumonia   . RHINITIS 06/21/2009   Qualifier: Diagnosis of  By: Birdie Riddle MD, Belenda Cruise    . Right ankle injury, subsequent encounter 01/11/2018  . Right foot pain 08/09/2011  . Sinusitis, chronic   . Skene's gland abscess 2009   patient unaware  . Sprain of unspecified ligament of right ankle, subsequent encounter   . TRANSAMINASES, SERUM, ELEVATED 12/07/2009   Qualifier: Diagnosis of  By: Inda Castle FNP, Wellington Hampshire   . Unspecified cataract   . Urinary tract infection, site not specified   . Vitamin D deficiency  Immunizations up to date:  Yes.     Past Medical History:  Diagnosis Date  . Abdominal pain, left upper quadrant   . Acute bronchospasm   . Acute neck pain 07/02/2019  . Acute sinusitis, unspecified 05/20/2009   Centricity Description: SINUSITIS - ACUTE-NOS Qualifier: Diagnosis of  By: Birdie Riddle MD, Belenda Cruise   Centricity Description: ACUTE SINUSITIS, UNSPECIFIED Qualifier: Diagnosis of  By: Alveta Heimlich MD, Cornelia Copa   Centricity Description: SINUSITIS, ACUTE Qualifier: Diagnosis of  By: Koleen Nimrod MD, Dellis Filbert    . Atypical chest pain 03/05/2013  . Body mass index (BMI)  of 23.0-23.9 in adult   . Cervicalgia   . Colon polyp   . Costochondral junction syndrome   . Cough   . DEPRESSION 08/25/2008   Qualifier: Diagnosis of  By: Ronnald Ramp CMA, Chemira    . Diarrhea   . Diverticulosis 07/27/2012  . Dorsalgia   . Elevated blood pressure reading without diagnosis of hypertension   . Endometrial polyp 8/08   benign  . Essential hypertension 09/20/2020  . Family history of osteoporosis 12/28/2011   Mother pt had normal Dexa 11/2009   . Fatigue   . GERD (gastroesophageal reflux disease)   . HIP PAIN, RIGHT 04/12/2009   Qualifier: Diagnosis of  By: Oneida Alar MD, KARL    . History of hiatal hernia   . HYPERLIPIDEMIA 08/25/2008   Qualifier: Diagnosis of  By: Birdie Riddle MD, Belenda Cruise    . IBS (irritable bowel syndrome)   . Left hip pain 07/30/2013  . Left wrist pain 07/30/2013  . Lumbar strain, initial encounter 07/28/2019  . LUNG NODULE 11/19/2009   Neg CT    . 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 Menopause   . Migraine   . NEUTROPENIA UNSPECIFIED 10/07/2008   Qualifier: Diagnosis of  By: Birdie Riddle MD, Belenda Cruise    . NONSPCIFC ABN FINDING RAD & OTH EXAM LUNG FIELD 11/19/2009   Qualifier: Diagnosis of  By: Alveta Heimlich MD, Cornelia Copa    . Nonspecific (abnormal) findings on radiological and other examination of body structure 11/19/2009   Qualifier: Diagnosis of By: Alveta Heimlich MD, Bennie Dallas list entry automatically replaced. Please review for accuracy.  . Otalgia, unspecified ear   . Pain in throat   . Palpitations   . Paroxysmal atrial fibrillation (HCC)   . Peroneal tendinitis of right lower extremity 03/12/2014  . Plantar wart 04/02/2014  . Pneumonia   . RHINITIS 06/21/2009   Qualifier: Diagnosis of  By: Birdie Riddle MD, Belenda Cruise    . Right ankle injury, subsequent encounter 01/11/2018  . Right foot pain 08/09/2011  . Sinusitis, chronic   . Skene's gland abscess 2009   patient unaware  . Sprain of unspecified ligament of right ankle, subsequent encounter   .  TRANSAMINASES, SERUM, ELEVATED 12/07/2009   Qualifier: Diagnosis of  By: Inda Castle FNP, Wellington Hampshire   . Unspecified cataract   . Urinary tract infection, site not specified   . Vitamin D deficiency     Patient Active Problem List   Diagnosis Date Noted  . Colonoscopy causing post-procedural bleeding 09/20/2020  . Essential hypertension 09/20/2020  . AF (paroxysmal atrial fibrillation) ([place]) 09/20/2020  . Plantar wart 04/02/2014  . Peroneal tendinitis of right lower extremity 03/12/2014  . Diverticulosis 07/27/2012  . Sinusitis, chronic   . Colon polyp   . Menopause   . Migraine   . [place] LUNG FIELD 11/19/2009  . HIP PAIN, RIGHT 04/12/2009  . HYPERLIPIDEMIA  08/25/2008  . Depression 08/25/2008    Past Surgical History:  Procedure Laterality Date  . COLONOSCOPY    . DILATION AND CURETTAGE OF UTERUS  8/08  . GYNECOLOGIC CRYOSURGERY  1980  . HYSTEROSCOPY WITH D & C N/A 03/30/2017   Procedure: DILATATION & CURETTAGE/HYSTEROSCOPY WITH ULTRASOUND GUIDANCE;  Surgeon: Megan Salon, MD;  Location: [facility];  Service: Gynecology;  Laterality: N/A;  . UPPER GI ENDOSCOPY    . uterine ablation  2007    Prior to Admission medications   Medication Sig Start Date End Date Taking? Authorizing Provider  Ascorbic Acid (VITAMIN C) 1000 MG tablet Take 500 mg by mouth 3 (three) times a week.   Yes [provider]  azelastine (ASTELIN) 0.1 % nasal spray Place 1 spray into both nostrils daily as needed for allergies. 02/04/19  Yes [provider]  citalopram (CELEXA) 20 MG tablet TAKE 1 & 1/2 TABLET BY MOUTH ONCE DAILY 12/28/20 12/28/21 Yes Fanny Bien, MD  diltiazem (CARDIZEM) 30 MG tablet TAKE 1 TABLET BY MOUTH 2 TIMES A DAY IF NEEDED FOR AFIB 12/28/20 12/28/21 Yes Fanny Bien, MD  famotidine (PEPCID) 20 MG tablet Take 20 mg by mouth as needed for heartburn or indigestion.   Yes [provider]  famotidine (PEPCID) 20 MG tablet Take 1  tablet (20 mg total) by mouth once to twice daily as needed. 05/05/20  Yes Fanny Bien, MD  metoprolol succinate (TOPROL-XL) 25 MG 24 hr tablet TAKE 1 TABLET BY MOUTH DAILY 12/28/20 12/28/21 Yes Fanny Bien, MD  montelukast (SINGULAIR) 10 MG tablet TAKE 1 TABLET (10 MG) BY MOUTH DAILY 12/28/20 12/28/21 Yes Fanny Bien, MD  Multiple Vitamins-Minerals (MULTI FOR HER 50+) TABS Take 1 tablet by mouth daily.   Yes [provider]  Omega-3 Fatty Acids (FISH OIL) 1000 MG CAPS Take 1,000 mg by mouth daily.   Yes [provider]  omeprazole (PRILOSEC) 20 MG capsule TAKE 1 CAPSULE BY MOUTH 30 MINUTES BEFORE MORNING MEAL 12/28/20  Yes Fanny Bien, MD  Probiotic Product (PROBIOTIC PO) Take 1 capsule by mouth daily. Called ultra spectrum   Yes [provider]  sucralfate (CARAFATE) 1 GM/10ML suspension Take 10 mLs (1 g total) by mouth 4 (four) times daily -  with meals and at bedtime for 7 days. 03/09/21 03/20/21 Yes Vallarie Mare M, PA-C  COVID-19 mRNA Vac-TriS, Pfizer, (PFIZER-BIONT COVID-19 VAC-TRIS) SUSP injection Inject into the muscle. 02/11/21   Carlyle Basques, MD    Allergies Augmentin [amoxicillin-pot clavulanate]  Family History  Problem Relation Age of Onset  . Colon polyps Father   . Ulcerative colitis Father   . CAD Father        Stent placement at age 31  . Glaucoma Father   . Hypertension Father   . Colitis Father   . Breast cancer Paternal Grandmother   . Colon cancer Maternal Grandfather   . Colon polyps Mother   . Osteoporosis Mother   . Thyroid disease Mother        hypo  . Glaucoma Mother   . Diverticulosis Mother   . Sudden death Neg Hx   . Hyperlipidemia Neg Hx   . Heart attack Neg Hx   . Diabetes Neg Hx     Social History Social History   Tobacco Use  . Smoking status: Former Smoker    Packs/day: 0.10    Years: 3.00    Pack years: 0.30    Types: Cigarettes  Quit date: 10/02/1978    Years since quitting: 42.4  .  Smokeless tobacco: Never Used  Vaping Use  . Vaping Use: Never used  Substance Use Topics  . Alcohol use: Yes    Alcohol/week: 7.0 standard drinks    Types: 7 Glasses of wine per week    Comment: per week  . Drug use: No     Review of Systems  Constitutional: No fever/chills Eyes:  No discharge ENT: Patient has pharyngitis. Respiratory: no cough. No SOB/ use of accessory muscles to breath Gastrointestinal:   No nausea, no vomiting.  No diarrhea.  No constipation. Musculoskeletal: Negative for musculoskeletal pain. Skin: Negative for rash, abrasions, lacerations, ecchymosis.   ____________________________________________   PHYSICAL EXAM:  VITAL SIGNS: ED Triage Vitals  Enc Vitals Group     BP 03/09/21 1053 (!) 139/91     Pulse Rate 03/09/21 1053 60     Resp 03/09/21 1053 18     Temp 03/09/21 1053 98.5 F (36.9 C)     Temp Source 03/09/21 1053 Oral     SpO2 03/09/21 1053 98 %     Weight 03/09/21 1046 145 lb (65.8 kg)     Height 03/09/21 1046 5' 4.5" (1.638 m)     Head Circumference --      Peak Flow --      Pain Score 03/09/21 1046 2     Pain Loc --      Pain Edu? --      Excl. in [place]? --      Constitutional: Alert and oriented. Well appearing and in no acute distress. Eyes: Conjunctivae are normal. PERRL. EOMI. Head: Atraumatic. ENT:      Ears:       Nose: No congestion/rhinnorhea.      Mouth/Throat: Mucous membranes are moist.  Patient has ulceration along right posterior pharynx with erythema.  Uvula is midline. Neck: No stridor.  No cervical spine tenderness to palpation. Cardiovascular: Normal rate, regular rhythm. Normal S1 and S2.  Good peripheral circulation. Respiratory: Normal respiratory effort without tachypnea or retractions. Lungs CTAB. Good air entry to the bases with no decreased or absent breath sounds Gastrointestinal: Bowel sounds x 4 quadrants. Soft and nontender to palpation. No guarding or rigidity. No distention. Musculoskeletal: Full  range of motion to all extremities. No obvious deformities noted Neurologic:  Normal for age. No gross focal neurologic deficits are appreciated.  Skin:  Skin is warm, dry and intact. No rash noted. Psychiatric: Mood and affect are normal for age. Speech and behavior are normal.   ____________________________________________   LABS (all labs ordered are listed, but only abnormal results are displayed)  Labs Reviewed  CULTURE, GROUP A STREP  COVID-19, FLU A+B NAA  POCT RAPID STREP A (OFFICE)   ____________________________________________  EKG   ____________________________________________  RADIOLOGY   No results found.  ____________________________________________    PROCEDURES  Procedure(s) performed:     Procedures     Medications - No data to display   ____________________________________________   INITIAL IMPRESSION / ASSESSMENT AND PLAN / ED COURSE  Pertinent labs & imaging results that were available during my care of the patient were reviewed by me and considered in my medical decision making (see chart for details).      Assessment and plan Pharyngitis 65 year old female presents to the urgent care with pharyngitis for the past 2 days.  Rapid strep was negative and culture is pending at this time.  Sendoff COVID-19 and influenza testing results are  in process at this time.  Patient was discharged with Carafate for pharyngitis and also recommended Tylenol and ibuprofen alternating for discomfort.  Rest and hydration were encouraged at home.  All patient questions were answered.     ____________________________________________  FINAL CLINICAL IMPRESSION(S) / ED DIAGNOSES  Final diagnoses:  Acute pharyngitis, unspecified etiology      NEW MEDICATIONS STARTED DURING THIS VISIT:  ED Discharge Orders         Ordered    sucralfate (CARAFATE) 1 GM/10ML suspension  3 times daily with meals & bedtime        03/09/21 1132               This chart was dictated using voice recognition software/Dragon. Despite best efforts to proofread, errors can occur which can change the meaning. Any change was purely unintentional.     Lannie Fields, PA-C 03/09/21 1140

## 2021-03-09 NOTE — Discharge Instructions (Signed)
You can take Tylenol and ibuprofen alternating for pharyngitis. You have been prescribed Carafate to help with sore throat as well. Your COVID-19 and influenza results should post to MyChart.

## 2021-03-09 NOTE — ED Triage Notes (Signed)
Pt c/o sore throat and white spots on throat x 1 day. Negative Covid at home test. Denies fever or cough.

## 2021-03-10 ENCOUNTER — Other Ambulatory Visit (HOSPITAL_COMMUNITY): Payer: Self-pay

## 2021-03-10 MED ORDER — CARESTART COVID-19 HOME TEST VI KIT
PACK | 0 refills | Status: DC
  Filled 2021-03-10 – 2021-03-11 (×2): qty 4, 4d supply, fill #0

## 2021-03-11 ENCOUNTER — Other Ambulatory Visit (HOSPITAL_COMMUNITY): Payer: Self-pay

## 2021-03-12 LAB — COVID-19, FLU A+B NAA
Influenza A, NAA: NOT DETECTED
Influenza B, NAA: NOT DETECTED
SARS-CoV-2, NAA: NOT DETECTED

## 2021-03-12 LAB — CULTURE, GROUP A STREP: Strep A Culture: NEGATIVE

## 2021-03-14 ENCOUNTER — Other Ambulatory Visit (HOSPITAL_BASED_OUTPATIENT_CLINIC_OR_DEPARTMENT_OTHER): Payer: Self-pay

## 2021-03-14 ENCOUNTER — Other Ambulatory Visit (HOSPITAL_COMMUNITY): Payer: Self-pay

## 2021-03-15 ENCOUNTER — Other Ambulatory Visit (HOSPITAL_COMMUNITY): Payer: Self-pay

## 2021-03-21 ENCOUNTER — Telehealth: Payer: Self-pay

## 2021-03-21 NOTE — Telephone Encounter (Signed)
LMVM for the patient to contact the office and schedule a new patient appointment with Dr. Jerilee Hoh.

## 2021-03-22 NOTE — Telephone Encounter (Signed)
Pt called back and has been scheduled.

## 2021-03-23 MED FILL — Metoprolol Succinate Tab ER 24HR 25 MG (Tartrate Equiv): ORAL | 90 days supply | Qty: 90 | Fill #0 | Status: AC

## 2021-03-23 NOTE — Progress Notes (Signed)
Cardiology Office Note    Date:  03/29/2021   ID:  Kara Warren, DOB 03/31/1956, MRN [id number]   PCP:  Fanny Bien, MD   [place]  Cardiologist:  Lauree Chandler, MD   Advanced Practice Provider:  No care team member to display Electrophysiologist:  None   [phone]   Chief Complaint  Patient presents with   Follow-up     History of Present Illness:  Kara Warren is a 65 y.o. female with history of paroxysmal atrial fibrillation, depression, irritable bowel syndrome, hiatal hernia, GERD, hyperlipidemia.  History of chest pain with normal GXT 2014  Echo 09/04/19 showed KNLZ=76-73%, grade 1 diastolic dysfunction. There was no significant valve disease. Cardiac monitor with atrial fibrillation, SVT and one 7 beat run of VT. She was started on Eliquis in January 2021 (CHADS VASC score of 2). She was seen in the atrial fibrillation clinic in February 2021 and Eliquis was stopped.   Patient last saw Dr. Angelena Form 03/31/2020 at which time she was turning 11 and her CHA2DS2-VASc score would be 2 for female and age.  He reviewed with EP team and  with a CHA2DS2-VASc of 2 new guidelines state females not have to be on Eliquis.  She was placed on aspirin 81 mg daily with plans to resume Eliquis if further A. fib.  Patient comes in for f/u. Still has occasional palpitations last a couple minutes-5 min, hasn't used cardizem. Takes a few deep breaths and it goes away. Worse if upset or doesn't get a good night sleep. She walks 2-4 miles daily, does pilates, strength, bikes. No palpitations when exercising. Drinks 1 cup coffee daily. 1-2 glasses of wine daily.   Past Medical History:  Diagnosis Date   Abdominal pain, left upper quadrant    Acute bronchospasm    Acute neck pain 07/02/2019   Acute sinusitis, unspecified 05/20/2009   Centricity Description: SINUSITIS - ACUTE-NOS Qualifier: Diagnosis of  By: Birdie Riddle MD, Belenda Cruise   Centricity  Description: ACUTE SINUSITIS, UNSPECIFIED Qualifier: Diagnosis of  By: Alveta Heimlich MD, Cornelia Copa   Centricity Description: SINUSITIS, ACUTE Qualifier: Diagnosis of  By: Koleen Nimrod MD, Dellis Filbert     Atypical chest pain 03/05/2013   Body mass index (BMI) of 23.0-23.9 in adult    Cervicalgia    Colon polyp    Costochondral junction syndrome    Cough    DEPRESSION 08/25/2008   Qualifier: Diagnosis of  By: Ronnald Ramp CMA, Chemira     Diarrhea    Diverticulosis 07/27/2012   Dorsalgia    Elevated blood pressure reading without diagnosis of hypertension    Endometrial polyp 8/08   benign   Essential hypertension 09/20/2020   Family history of osteoporosis 12/28/2011   Mother pt had normal Dexa 11/2009    Fatigue    GERD (gastroesophageal reflux disease)    HIP PAIN, RIGHT 04/12/2009   Qualifier: Diagnosis of  By: Oneida Alar MD, KARL     History of hiatal hernia    HYPERLIPIDEMIA 08/25/2008   Qualifier: Diagnosis of  By: Birdie Riddle MD, Belenda Cruise     IBS (irritable bowel syndrome)    Left hip pain 07/30/2013   Left wrist pain 07/30/2013   Lumbar strain, initial encounter 07/28/2019   LUNG NODULE 11/19/2009   Neg CT     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 Please review for accuracy.   Otalgia, unspecified ear    Pain in throat    Palpitations    Paroxysmal atrial fibrillation (HCC)    Peroneal tendinitis of right lower extremity 03/12/2014   Plantar wart 04/02/2014   Pneumonia    RHINITIS 06/21/2009   Qualifier: Diagnosis of  By: Birdie Riddle MD, Belenda Cruise     Right ankle injury, subsequent  encounter 01/11/2018   Right foot pain 08/09/2011   Sinusitis, chronic    Skene's gland abscess 2009   patient unaware   Sprain of unspecified ligament of right ankle, subsequent encounter    TRANSAMINASES, SERUM, ELEVATED 12/07/2009   Qualifier: Diagnosis of  By: Inda Castle FNP, Melissa S    Unspecified cataract    Urinary tract infection, site not specified    Vitamin D deficiency     Past Surgical History:  Procedure Laterality Date   COLONOSCOPY     DILATION AND CURETTAGE OF UTERUS  8/08   GYNECOLOGIC CRYOSURGERY  1980   HYSTEROSCOPY WITH D & C N/A 03/30/2017   Procedure: DILATATION & CURETTAGE/HYSTEROSCOPY WITH ULTRASOUND GUIDANCE;  Surgeon: Megan Salon, MD;  Location: [facility];  Service: Gynecology;  Laterality: N/A;   UPPER GI ENDOSCOPY     uterine ablation  2007    Current Medications: Current Meds  Medication Sig   Ascorbic Acid (VITAMIN C) 1000 MG tablet Take 500 mg by mouth 3 (three) times a week.   aspirin EC 81 MG tablet Take 81 mg by mouth daily. Swallow whole.   azelastine (ASTELIN) 0.1 % nasal spray Place 1 spray into both nostrils daily as needed for allergies.   citalopram (CELEXA) 20 MG tablet TAKE 1 & 1/2 TABLET BY MOUTH ONCE DAILY   COVID-19 At Home Antigen Test (CARESTART COVID-19 HOME TEST) KIT Use as directed within package instructions.   COVID-19 mRNA Vac-TriS, Pfizer, (PFIZER-BIONT COVID-19 VAC-TRIS) SUSP injection Inject into the muscle.   diltiazem (CARDIZEM) 30 MG tablet TAKE 1 TABLET BY MOUTH 2 TIMES A DAY IF NEEDED FOR AFIB   famotidine (PEPCID) 20 MG tablet Take 1 tablet (20 mg total) by mouth once to twice daily as needed.   metoprolol succinate (TOPROL-XL) 25 MG 24 hr tablet TAKE 1 TABLET BY MOUTH DAILY   montelukast (SINGULAIR) 10 MG tablet TAKE 1 TABLET (10 MG) BY MOUTH DAILY   Multiple Vitamins-Minerals (MULTI FOR HER 50+) TABS Take 1 tablet by mouth daily.   Omega-3 Fatty Acids (FISH OIL) 1000 MG CAPS Take 1,000 mg by mouth daily.   omeprazole  (PRILOSEC) 20 MG capsule TAKE 1 CAPSULE BY MOUTH 30 MINUTES BEFORE MORNING MEAL   Probiotic Product (PROBIOTIC PO) Take 1 capsule by mouth daily. Called ultra spectrum     Allergies:   Augmentin [amoxicillin-pot clavulanate]   Social History   Socioeconomic History   Marital status: Single    Spouse name: Not on file   Number of children: 0   Years of education: Not on file   Highest education level: Not on file  Occupational History   Occupation: Therapist, sports    Employer: [employer]  Tobacco Use   Smoking status: Former    Packs/day: 0.10  Years: 3.00    Pack years: 0.30    Types: Cigarettes    Quit date: 10/02/1978    Years since quitting: 42.5   Smokeless tobacco: Never  Vaping Use   Vaping Use: Never used  Substance and Sexual Activity   Alcohol use: Yes    Alcohol/week: 7.0 standard drinks    Types: 7 Glasses of wine per week    Comment: per week   Drug use: No   Sexual activity: Not Currently    Birth control/protection: Post-menopausal  Other Topics Concern   Not on file  Social History Narrative   Not on file   Social Determinants of Health   Financial Resource Strain: Not on file  Food Insecurity: Not on file  Transportation Needs: Not on file  Physical Activity: Not on file  Stress: Not on file  Social Connections: Not on file     Family History:  The patient's  family history includes Breast cancer in her paternal grandmother; CAD in her father; Colitis in her father; Colon cancer in her maternal grandfather; Colon polyps in her father and mother; Diverticulosis in her mother; Glaucoma in her father and mother; Hypertension in her father; Osteoporosis in her mother; Thyroid disease in her mother; Ulcerative colitis in her father.   ROS:   Please see the history of present illness.    ROS All other systems reviewed and are negative.   PHYSICAL EXAM:   VS:  BP 130/78   Pulse 61   Ht 5' 5" (1.651 m)   Wt 146 lb 12.8 oz (66.6 kg)   LMP 10/02/2006  (Approximate)   SpO2 98%   BMI 24.43 kg/m   Physical Exam  GEN: Well nourished, well developed, in no acute distress  Neck: no JVD, carotid bruits, or masses Cardiac:RRR; no murmurs, rubs, or gallops  Respiratory:  clear to auscultation bilaterally, normal work of breathing GI: soft, nontender, nondistended, + BS Ext: without cyanosis, clubbing, or edema, Good distal pulses bilaterally Neuro:  Alert and Oriented x 3, Psych: euthymic mood, full affect  Wt Readings from Last 3 Encounters:  03/29/21 146 lb 12.8 oz (66.6 kg)  03/09/21 145 lb (65.8 kg)  12/08/20 142 lb (64.4 kg)      Studies/Labs Reviewed:   EKG:  EKG is  ordered today.  The ekg ordered today demonstrates NSR normal EKG  Recent Labs: 05/30/2020: ALT 18 09/21/2020: BUN 6; Creatinine, Ser 0.70; Hemoglobin 12.4; Platelets 298; Potassium 3.9; Sodium 136   Lipid Panel    Component Value Date/Time   CHOL 205 (H) 03/16/2014 1152   TRIG 108 03/16/2014 1152   HDL 103 03/16/2014 1152   CHOLHDL 2.0 03/16/2014 1152   VLDL 22 03/16/2014 1152   LDLCALC 80 03/16/2014 1152    Additional studies/ records that were reviewed today include:   Monitor 10/07/19 Study Highlights  Sinus rhythm with sinus bradycardia Atrial fibrillation/flutter noted. Longest duration 2 hours, 25 minutes. Several short bursts of supraventricular tachycardia One 7 beat run of ventricular tachycardia.  Echo 09/2019 IMPRESSIONS     1. Left ventricular ejection fraction, by visual estimation, is 60 to  65%. The left ventricle has normal function. Left ventricular septal wall  thickness was mildly increased. Mildly increased left ventricular  posterior wall thickness. There is mildly  increased left ventricular hypertrophy.   2. Left ventricular diastolic parameters are consistent with Grade I  diastolic dysfunction (impaired relaxation).   3. Global right ventricle has normal systolic function.The right  ventricular  size is normal. No  increase in right ventricular wall  thickness.   4. Left atrial size was moderately dilated.   5. Right atrial size was normal.   6. The mitral valve is normal in structure. No evidence of mitral valve  regurgitation. No evidence of mitral stenosis.   7. The tricuspid valve is normal in structure. Tricuspid valve  regurgitation is trivial.   8. The aortic valve is tricuspid. Aortic valve regurgitation is not  visualized. No evidence of aortic valve sclerosis or stenosis.   9. The pulmonic valve was normal in structure. Pulmonic valve  regurgitation is trivial.  10. Normal pulmonary artery systolic pressure.  11. The inferior vena cava is normal in size with greater than 50%  respiratory variability, suggesting right atrial pressure of 3 mmHg.    Risk Assessment/Calculations:    CHA2DS2-VASc Score = 2  This indicates a 2.2% annual risk of stroke. The patient's score is based upon: CHF History: No HTN History: No Diabetes History: No Stroke History: No Vascular Disease History: No Age Score: 1 Gender Score: 1        ASSESSMENT:    1. Paroxysmal atrial fibrillation (HCC)   2. Hyperlipidemia, unspecified hyperlipidemia type   3. History of chest pain      PLAN:  In order of problems listed above:  PAF on Toprol and aspirin.  CHA2DS2-VASc is 2 for female sex and age.  Per EP guidelines indicate a female/age does not need to be on Eliquis with this CHA2DS2-VASc score with recurrent A. Fib.  Patient has occasional palpitations.  She now has an apple watch so will be able to track whether or not she is going in and out of A. fib.  Has not had to use diltiazem.  Continue Toprol and baby aspirin.  Hyperlipidemia LDL 109 10/2020.  Father with history of CAD.  Recommend LDL less than 90  History of chest pain with normal GXT 2014  Shared Decision Making/Informed Consent        Medication Adjustments/Labs and Tests Ordered: Current medicines are reviewed at length with the  patient today.  Concerns regarding medicines are outlined above.  Medication changes, Labs and Tests ordered today are listed in the Patient Instructions below. Patient Instructions  Medication Instructions:  Your physician recommends that you continue on your current medications as directed. Please refer to the Current Medication list given to you today.  *If you need a refill on your cardiac medications before your next appointment, please call your pharmacy*   Lab Work: None If you have labs (blood work) drawn today and your tests are completely normal, you will receive your results only by: [place] (if you have MyChart) OR A paper copy in the mail If you have any lab test that is abnormal or we need to change your treatment, we will call you to review the results.   Follow-Up: At [facility], you and your health needs are our priority.  As part of our continuing mission to provide you with exceptional heart care, we have created designated Provider Care Teams.  These Care Teams include your primary Cardiologist (physician) and Advanced Practice Providers (APPs -  Physician Assistants and Nurse Practitioners) who all work together to provide you with the care you need, when you need it.   Your next appointment:   1 year(s)  The format for your next appointment:   In Person  Provider:   You may see Lauree Chandler, MD or one of  the following Advanced Practice Providers on your designated Care Team:   Melina Copa, PA-C Ermalinda Barrios, PA-C     Signed, Ermalinda Barrios, PA-C  03/29/2021 10:56 AM    [facility] [address] [place], [place]  [id number] Phone: [phone]; Fax: [phone]

## 2021-03-24 ENCOUNTER — Other Ambulatory Visit (HOSPITAL_COMMUNITY): Payer: Self-pay

## 2021-03-29 ENCOUNTER — Ambulatory Visit: Payer: 59 | Admitting: Physician Assistant

## 2021-03-29 ENCOUNTER — Encounter: Payer: Self-pay | Admitting: Physician Assistant

## 2021-03-29 ENCOUNTER — Other Ambulatory Visit: Payer: Self-pay

## 2021-03-29 VITALS — BP 130/78 | HR 61 | Ht 65.0 in | Wt 146.8 lb

## 2021-03-29 DIAGNOSIS — E785 Hyperlipidemia, unspecified: Secondary | ICD-10-CM

## 2021-03-29 DIAGNOSIS — I480 Paroxysmal atrial fibrillation: Secondary | ICD-10-CM

## 2021-03-29 DIAGNOSIS — Z87898 Personal history of other specified conditions: Secondary | ICD-10-CM

## 2021-03-29 NOTE — Patient Instructions (Signed)
Medication Instructions:  Your physician recommends that you continue on your current medications as directed. Please refer to the Current Medication list given to you today.  *If you need a refill on your cardiac medications before your next appointment, please call your pharmacy*   Lab Work: None If you have labs (blood work) drawn today and your tests are completely normal, you will receive your results only by: [place] (if you have MyChart) OR A paper copy in the mail If you have any lab test that is abnormal or we need to change your treatment, we will call you to review the results.   Follow-Up: At [facility], you and your health needs are our priority.  As part of our continuing mission to provide you with exceptional heart care, we have created designated Provider Care Teams.  These Care Teams include your primary Cardiologist (physician) and Advanced Practice Providers (APPs -  Physician Assistants and Nurse Practitioners) who all work together to provide you with the care you need, when you need it.   Your next appointment:   1 year(s)  The format for your next appointment:   In Person  Provider:   You may see Lauree Chandler, MD or one of the following Advanced Practice Providers on your designated Care Team:   Melina Copa, PA-C Ermalinda Barrios, PA-C

## 2021-04-06 ENCOUNTER — Ambulatory Visit (HOSPITAL_BASED_OUTPATIENT_CLINIC_OR_DEPARTMENT_OTHER)
Admission: RE | Admit: 2021-04-06 | Discharge: 2021-04-06 | Disposition: A | Discharge status: Home / Self Care | Payer: 59 | Source: Ambulatory Visit | Attending: Physician Assistant | Admitting: Physician Assistant

## 2021-04-06 ENCOUNTER — Other Ambulatory Visit: Payer: Self-pay

## 2021-04-06 DIAGNOSIS — Z136 Encounter for screening for cardiovascular disorders: Secondary | ICD-10-CM | POA: Insufficient documentation

## 2021-04-14 ENCOUNTER — Ambulatory Visit: Payer: 59 | Admitting: Psychology

## 2021-04-28 ENCOUNTER — Other Ambulatory Visit (HOSPITAL_BASED_OUTPATIENT_CLINIC_OR_DEPARTMENT_OTHER): Payer: Self-pay | Admitting: Family Medicine

## 2021-04-28 DIAGNOSIS — Z1231 Encounter for screening mammogram for malignant neoplasm of breast: Secondary | ICD-10-CM

## 2021-04-29 ENCOUNTER — Ambulatory Visit (INDEPENDENT_AMBULATORY_CARE_PROVIDER_SITE_OTHER): Payer: 59 | Admitting: Psychology

## 2021-04-29 DIAGNOSIS — F418 Other specified anxiety disorders: Secondary | ICD-10-CM | POA: Diagnosis not present

## 2021-05-12 ENCOUNTER — Other Ambulatory Visit (HOSPITAL_COMMUNITY): Payer: Self-pay

## 2021-05-12 MED FILL — Citalopram Hydrobromide Tab 20 MG (Base Equiv): ORAL | 90 days supply | Qty: 135 | Fill #0 | Status: AC

## 2021-05-12 MED FILL — Omeprazole Cap Delayed Release 20 MG: ORAL | 90 days supply | Qty: 90 | Fill #1 | Status: AC

## 2021-05-12 MED FILL — Montelukast Sodium Tab 10 MG (Base Equiv): ORAL | 90 days supply | Qty: 90 | Fill #0 | Status: AC

## 2021-05-23 ENCOUNTER — Other Ambulatory Visit: Payer: Self-pay

## 2021-05-23 ENCOUNTER — Encounter (HOSPITAL_BASED_OUTPATIENT_CLINIC_OR_DEPARTMENT_OTHER): Payer: Self-pay

## 2021-05-23 ENCOUNTER — Ambulatory Visit (HOSPITAL_BASED_OUTPATIENT_CLINIC_OR_DEPARTMENT_OTHER)
Admission: RE | Admit: 2021-05-23 | Discharge: 2021-05-23 | Disposition: A | Discharge status: Home / Self Care | Payer: 59 | Source: Ambulatory Visit | Attending: Family Medicine | Admitting: Family Medicine

## 2021-05-23 DIAGNOSIS — Z1231 Encounter for screening mammogram for malignant neoplasm of breast: Secondary | ICD-10-CM | POA: Diagnosis not present

## 2021-05-25 ENCOUNTER — Other Ambulatory Visit: Payer: Self-pay | Admitting: Family Medicine

## 2021-05-25 ENCOUNTER — Ambulatory Visit: Payer: 59 | Admitting: Psychology

## 2021-05-25 DIAGNOSIS — R928 Other abnormal and inconclusive findings on diagnostic imaging of breast: Secondary | ICD-10-CM

## 2021-05-31 DIAGNOSIS — J3089 Other allergic rhinitis: Secondary | ICD-10-CM | POA: Diagnosis not present

## 2021-06-08 ENCOUNTER — Ambulatory Visit: Payer: 59 | Admitting: Family Medicine

## 2021-06-08 ENCOUNTER — Other Ambulatory Visit: Payer: Self-pay

## 2021-06-08 ENCOUNTER — Other Ambulatory Visit (HOSPITAL_COMMUNITY): Payer: Self-pay

## 2021-06-08 VITALS — Ht 65.0 in | Wt 145.0 lb

## 2021-06-08 DIAGNOSIS — S39012A Strain of muscle, fascia and tendon of lower back, initial encounter: Secondary | ICD-10-CM

## 2021-06-08 MED ORDER — DICLOFENAC SODIUM 75 MG PO TBEC
75.0000 mg | DELAYED_RELEASE_TABLET | Freq: Two times a day (BID) | ORAL | 1 refills | Status: DC
  Filled 2021-06-08: qty 60, 30d supply, fill #0

## 2021-06-08 NOTE — Patient Instructions (Signed)
You have a lumbar strain. Ok to take tylenol for baseline pain relief (1-2 extra strength tabs 3x/day) Take diclofenac '75mg'$  twice a day with food for pain and inflammation - don't take aleve, mobic, ibuprofen while on this. Stay as active as possible. Do home exercises and stretches as directed - hold each for 20-30 seconds and do each one three times. Consider physical therapy. Strengthening of low back muscles, abdominal musculature are key for long term pain relief. If not improving, will consider further imaging (MRI). Let me know if you're not doing better over the next couple weeks.

## 2021-06-08 NOTE — Progress Notes (Signed)
PCP: Fanny Bien, MD  Subjective:   HPI: Patient is a 65 y.o. female here for low back pain.  Reports onset of of bilateral lumbar back pain 3 days ago after lifting a box off of her floor with admittedly poor form.  She is now experiencing mostly left lateral lumbar pain. She denies numbness/tingling in her left leg.  Denies additional red flag symptoms. She is able to sleep without waking up due to pain but endorses discomfort with most movements. She has tried numerous conservative measures including alternating ice/heat, Tylenol, ibuprofen, lidocaine patches, rolling with a tennis ball, Epsom salt baths, and stretching.  2 days ago she started using Mobic for pain relief. She is planning to go out of town later this week and is wondering what other treatment options are available.  Past Medical History:  Diagnosis Date   Abdominal pain, left upper quadrant    Acute bronchospasm    Acute neck pain 07/02/2019   Acute sinusitis, unspecified 05/20/2009   Centricity Description: SINUSITIS - ACUTE-NOS Qualifier: Diagnosis of  By: Birdie Riddle MD, Belenda Cruise   Centricity Description: ACUTE SINUSITIS, UNSPECIFIED Qualifier: Diagnosis of  By: Alveta Heimlich MD, Cornelia Copa   Centricity Description: SINUSITIS, ACUTE Qualifier: Diagnosis of  By: Koleen Nimrod MD, Dellis Filbert     Atypical chest pain 03/05/2013   Body mass index (BMI) of 23.0-23.9 in adult    Cervicalgia    Colon polyp    Costochondral junction syndrome    Cough    DEPRESSION 08/25/2008   Qualifier: Diagnosis of  By: Ronnald Ramp CMA, Chemira     Diarrhea    Diverticulosis 07/27/2012   Dorsalgia    Elevated blood pressure reading without diagnosis of hypertension    Endometrial polyp 8/08   benign   Essential hypertension 09/20/2020   Family history of osteoporosis 12/28/2011   Mother pt had normal Dexa 11/2009    Fatigue    GERD (gastroesophageal reflux disease)    HIP PAIN, RIGHT 04/12/2009   Qualifier: Diagnosis of  By: Oneida Alar MD, KARL     History of  hiatal hernia    HYPERLIPIDEMIA 08/25/2008   Qualifier: Diagnosis of  By: Birdie Riddle MD, Belenda Cruise     IBS (irritable bowel syndrome)    Left hip pain 07/30/2013   Left wrist pain 07/30/2013   Lumbar strain, initial encounter 07/28/2019   LUNG NODULE 11/19/2009   Neg CT     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 Please review for accuracy.   Otalgia, unspecified ear    Pain in throat    Palpitations    Paroxysmal atrial fibrillation (HCC)    Peroneal tendinitis of right lower extremity 03/12/2014   Plantar wart 04/02/2014   Pneumonia    RHINITIS 06/21/2009   Qualifier: Diagnosis of  By: Birdie Riddle MD, Belenda Cruise     Right ankle injury, subsequent encounter 01/11/2018   Right foot pain 08/09/2011   Sinusitis, chronic    Skene's gland abscess 2009   patient unaware   Sprain of unspecified ligament of right ankle, subsequent  encounter    TRANSAMINASES, SERUM, ELEVATED 12/07/2009   Qualifier: Diagnosis of  By: Inda Castle FNP, Melissa S    Unspecified cataract    Urinary tract infection, site not specified    Vitamin D deficiency     Current Outpatient Medications on File Prior to Visit  Medication Sig Dispense Refill   Ascorbic Acid (VITAMIN C) 1000 MG tablet Take 500 mg by mouth 3 (three) times a week.     aspirin EC 81 MG tablet Take 81 mg by mouth daily. Swallow whole.     azelastine (ASTELIN) 0.1 % nasal spray Place 1 spray into both nostrils daily as needed for allergies.     citalopram (CELEXA) 20 MG tablet Take 1.5 tablets (30 mg total) by mouth daily. 145  tablet 3   COVID-19 At Home Antigen Test (CARESTART COVID-19 HOME TEST) KIT Use as directed within package instructions. 4 each 0   COVID-19 mRNA Vac-TriS, Pfizer, (PFIZER-BIONT COVID-19 VAC-TRIS) SUSP injection Inject into the muscle. 0.3 mL 0   diltiazem (CARDIZEM) 30 MG tablet TAKE 1 TABLET BY MOUTH 2 TIMES A DAY IF NEEDED FOR AFIB 45 tablet 1   famotidine (PEPCID) 20 MG tablet Take 1 tablet (20 mg total) by mouth once to twice daily as needed. 90 tablet 3   metoprolol succinate (TOPROL-XL) 25 MG 24 hr tablet TAKE 1 TABLET BY MOUTH DAILY 90 tablet 3   montelukast (SINGULAIR) 10 MG tablet Take 1 tablet (10 mg total) by mouth daily. 90 tablet 3   Multiple Vitamins-Minerals (MULTI FOR HER 50+) TABS Take 1 tablet by mouth daily.     Omega-3 Fatty Acids (FISH OIL) 1000 MG CAPS Take 1,000 mg by mouth daily.     omeprazole (PRILOSEC) 20 MG capsule TAKE 1 CAPSULE BY MOUTH 30 MINUTES BEFORE MORNING MEAL 90 capsule 2   Probiotic Product (PROBIOTIC PO) Take 1 capsule by mouth daily. Called ultra spectrum     sucralfate (CARAFATE) 1 GM/10ML suspension Take 10 mLs (1 g total) by mouth 4 (four) times daily -  with meals and at bedtime for 7 days. 420 mL 0   No current facility-administered medications on file prior to visit.    Past Surgical History:  Procedure Laterality Date   COLONOSCOPY     DILATION AND CURETTAGE OF UTERUS  8/08   GYNECOLOGIC CRYOSURGERY  1980   HYSTEROSCOPY WITH D & C N/A 03/30/2017   Procedure: DILATATION & CURETTAGE/HYSTEROSCOPY WITH ULTRASOUND GUIDANCE;  Surgeon: Megan Salon, MD;  Location: [facility];  Service: Gynecology;  Laterality: N/A;   UPPER GI ENDOSCOPY     uterine ablation  2007    Allergies  Allergen Reactions   Augmentin [Amoxicillin-Pot Clavulanate] Diarrhea    LMP 10/02/2006 (Approximate)   [place] Adult Exercise 08/30/2020  Frequency of aerobic exercise (# of days/week) 7  Average time in minutes 60  Frequency of strengthening activities  (# of days/week) 3    No flowsheet data found.      Objective:  Physical Exam:  Gen: NAD, comfortable in exam room  No gross deformity, scoliosis. TTP over the left paraspinal lumbar region.  No midline or bony TTP. FROM, though there is pain with extension at the waist Strength LEs 5/5 all muscle groups.   2+ MSRs in patellar and achilles tendons, equal bilaterally. Negative SLRs. Sensation intact to light touch bilaterally.    Assessment & Plan:  1.  Lumbar strain Current symptoms, mechanism of injury, and exam findings today are  most consistent with lumbar strain vs less likely disc herniation.  She has already been doing a many of the conservative measures we would recommend.  We discussed starting diclofenac twice daily x 5-7 days instead of using Mobic or other NSAIDs.  We will also provide her with home PT exercises to perform.  We have instructed her to call our office in one week for his follow-up appointment if her pain worsens or she develops new symptoms.  Marland Kitchen, MD PGY-3  Patient seen and examined with resident.  Agree with his note above and findings.

## 2021-06-13 ENCOUNTER — Ambulatory Visit: Payer: 59

## 2021-06-13 ENCOUNTER — Other Ambulatory Visit: Payer: Self-pay

## 2021-06-13 ENCOUNTER — Ambulatory Visit
Admission: RE | Admit: 2021-06-13 | Discharge: 2021-06-13 | Disposition: A | Discharge status: Home / Self Care | Payer: 59 | Source: Ambulatory Visit | Attending: Family Medicine | Admitting: Family Medicine

## 2021-06-13 DIAGNOSIS — R922 Inconclusive mammogram: Secondary | ICD-10-CM | POA: Diagnosis not present

## 2021-06-13 DIAGNOSIS — R928 Other abnormal and inconclusive findings on diagnostic imaging of breast: Secondary | ICD-10-CM

## 2021-06-14 MED FILL — Metoprolol Succinate Tab ER 24HR 25 MG (Tartrate Equiv): ORAL | 90 days supply | Qty: 90 | Fill #1 | Status: AC

## 2021-06-15 ENCOUNTER — Other Ambulatory Visit (HOSPITAL_COMMUNITY): Payer: Self-pay

## 2021-06-16 ENCOUNTER — Ambulatory Visit (INDEPENDENT_AMBULATORY_CARE_PROVIDER_SITE_OTHER): Payer: 59 | Admitting: Psychology

## 2021-06-16 DIAGNOSIS — F418 Other specified anxiety disorders: Secondary | ICD-10-CM

## 2021-06-29 ENCOUNTER — Other Ambulatory Visit: Payer: Self-pay

## 2021-06-30 ENCOUNTER — Ambulatory Visit: Payer: 59 | Admitting: Internal Medicine

## 2021-06-30 ENCOUNTER — Encounter: Payer: Self-pay | Admitting: Internal Medicine

## 2021-06-30 VITALS — BP 110/76 | HR 55 | Temp 98.1°F | Ht 65.0 in | Wt 149.1 lb

## 2021-06-30 DIAGNOSIS — I480 Paroxysmal atrial fibrillation: Secondary | ICD-10-CM | POA: Diagnosis not present

## 2021-06-30 DIAGNOSIS — F330 Major depressive disorder, recurrent, mild: Secondary | ICD-10-CM

## 2021-06-30 NOTE — Progress Notes (Signed)
New Patient Office Visit     This visit occurred during the SARS-CoV-2 public health emergency.  Safety protocols were in place, including screening questions prior to the visit, additional usage of staff PPE, and extensive cleaning of exam room while observing appropriate contact time as indicated for disinfecting solutions.    CC/Reason for Visit: Establish care, discuss chronic medical conditions Previous PCP: Rachell Cipro, MD Last Visit: Earlier this year  HPI: Kara Warren is a 65 y.o. female who is coming in today for the above mentioned reasons. Past Medical History is significant for: Atrial fibrillation rate controlled on metoprolol not anticoagulated on exam 81 mg of aspirin daily, has a history of fever no allergies for which she takes Xyzal and as needed Singulair, history of depression well-controlled on citalopram.  She will be getting a flu vaccine at work and her COVID booster 2 weeks later.  Were not sure when her last pneumonia vaccine was.  Tdap and shingles are up-to-date currently.  She had a colonoscopy in 2021.  She had a mammogram earlier this month.  She had a Pap smear 2 years ago.  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.  She does not smoke, she drinks about a glass of wine every day to every other day.  She has no known drug allergies.   Past Medical/Surgical History: Past Medical History:  Diagnosis Date   Abdominal pain, left upper quadrant    Acute bronchospasm    Acute neck pain 07/02/2019   Acute sinusitis, unspecified 05/20/2009   Centricity Description: SINUSITIS - ACUTE-NOS Qualifier: Diagnosis of  By: Birdie Riddle MD, Belenda Cruise   Centricity Description: ACUTE SINUSITIS, UNSPECIFIED Qualifier: Diagnosis of  By: Alveta Heimlich MD, Cornelia Copa   Centricity Description: SINUSITIS, ACUTE Qualifier: Diagnosis of  By: Koleen Nimrod MD, Dellis Filbert     Atypical chest pain 03/05/2013   Body mass index (BMI) of 23.0-23.9 in adult    Cervicalgia    Colon polyp     Costochondral junction syndrome    Cough    DEPRESSION 08/25/2008   Qualifier: Diagnosis of  By: Ronnald Ramp CMA, Chemira     Diarrhea    Diverticulosis 07/27/2012   Dorsalgia    Elevated blood pressure reading without diagnosis of hypertension    Endometrial polyp 8/08   benign   Essential hypertension 09/20/2020   Family history of osteoporosis 12/28/2011   Mother pt had normal Dexa 11/2009    Fatigue    GERD (gastroesophageal reflux disease)    HIP PAIN, RIGHT 04/12/2009   Qualifier: Diagnosis of  By: Oneida Alar MD, KARL     History of hiatal hernia    HYPERLIPIDEMIA 08/25/2008   Qualifier: Diagnosis of  By: Birdie Riddle MD, Belenda Cruise     IBS (irritable bowel syndrome)    Left hip pain 07/30/2013   Left wrist pain 07/30/2013   Lumbar strain, initial encounter 07/28/2019   LUNG NODULE 11/19/2009   Neg CT     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 Please review for accuracy.  Otalgia, unspecified ear    Pain in throat    Palpitations    Paroxysmal atrial fibrillation (HCC)    Peroneal tendinitis of right lower extremity 03/12/2014   Plantar wart 04/02/2014   Pneumonia    RHINITIS 06/21/2009   Qualifier: Diagnosis of  By: Birdie Riddle MD, Belenda Cruise     Right ankle injury, subsequent encounter 01/11/2018   Right foot pain 08/09/2011   Sinusitis, chronic    Skene's gland abscess 2009   patient unaware   Sprain of unspecified ligament of right ankle, subsequent encounter    TRANSAMINASES, SERUM, ELEVATED 12/07/2009   Qualifier: Diagnosis of  By: Inda Castle FNP, Melissa S     Unspecified cataract    Urinary tract infection, site not specified    Vitamin D deficiency     Past Surgical History:  Procedure Laterality Date   COLONOSCOPY     DILATION AND CURETTAGE OF UTERUS  8/08   GYNECOLOGIC CRYOSURGERY  1980   HYSTEROSCOPY WITH D & C N/A 03/30/2017   Procedure: DILATATION & CURETTAGE/HYSTEROSCOPY WITH ULTRASOUND GUIDANCE;  Surgeon: Megan Salon, MD;  Location: [facility];  Service: Gynecology;  Laterality: N/A;   UPPER GI ENDOSCOPY     uterine ablation  2007    Social History:  reports that she quit smoking about 42 years ago. Her smoking use included cigarettes. She has a 0.30 pack-year smoking history. She has never used smokeless tobacco. She reports current alcohol use of about 7.0 standard drinks per week. She reports that she does not use drugs.  Allergies: Allergies  Allergen Reactions   Augmentin [Amoxicillin-Pot Clavulanate] Diarrhea    Family History:  Family History  Problem Relation Age of Onset   Colon polyps Father    Ulcerative colitis Father    CAD Father        Stent placement at age 39   Glaucoma Father    Hypertension Father    Colitis Father    Breast cancer Paternal Grandmother    Colon cancer Maternal Grandfather    Colon polyps Mother    Osteoporosis Mother    Thyroid disease Mother        hypo   Glaucoma Mother    Diverticulosis Mother    Sudden death Neg Hx    Hyperlipidemia Neg Hx    Heart attack Neg Hx    Diabetes Neg Hx      Current Outpatient Medications:    Ascorbic Acid (VITAMIN C) 1000 MG tablet, Take 500 mg by mouth 3 (three) times a week., Disp: , Rfl:    aspirin EC 81 MG tablet, Take 81 mg by mouth daily. Swallow whole., Disp: , Rfl:    azelastine (ASTELIN) 0.1 % nasal spray, Place 1 spray into both nostrils daily as needed for allergies., Disp: , Rfl:    citalopram (CELEXA) 20 MG tablet, Take 1.5 tablets (30 mg total) by mouth daily. (Patient taking differently: Take 20 mg by mouth daily.), Disp: 145  tablet, Rfl: 3   diltiazem (CARDIZEM) 30 MG tablet, TAKE 1 TABLET BY MOUTH 2 TIMES A DAY IF NEEDED FOR AFIB, Disp: 45 tablet, Rfl: 1   metoprolol succinate (TOPROL-XL) 25 MG 24 hr tablet, TAKE 1 TABLET BY MOUTH DAILY, Disp: 90 tablet, Rfl: 3   montelukast (SINGULAIR) 10 MG tablet, Take 1 tablet (10 mg total) by mouth daily., Disp: 90 tablet, Rfl: 3   Multiple Vitamins-Minerals (MULTI FOR HER 50+) TABS, Take 1 tablet by mouth daily., Disp: , Rfl:  Omega-3 Fatty Acids (FISH OIL) 1000 MG CAPS, Take 1,000 mg by mouth daily., Disp: , Rfl:    omeprazole (PRILOSEC) 20 MG capsule, TAKE 1 CAPSULE BY MOUTH 30 MINUTES BEFORE MORNING MEAL, Disp: 90 capsule, Rfl: 2   Probiotic Product (PROBIOTIC PO), Take 1 capsule by mouth daily. Called ultra spectrum, Disp: , Rfl:   Review of Systems:  Constitutional: Denies fever, chills, diaphoresis, appetite change and fatigue.  HEENT: Denies photophobia, eye pain, redness, hearing loss, ear pain, congestion, sore throat, rhinorrhea, sneezing, mouth sores, trouble swallowing, neck pain, neck stiffness and tinnitus.   Respiratory: Denies SOB, DOE, cough, chest tightness,  and wheezing.   Cardiovascular: Denies chest pain, palpitations and leg swelling.  Gastrointestinal: Denies nausea, vomiting, abdominal pain, diarrhea, constipation, blood in stool and abdominal distention.  Genitourinary: Denies dysuria, urgency, frequency, hematuria, flank pain and difficulty urinating.  Endocrine: Denies: hot or cold intolerance, sweats, changes in hair or nails, polyuria, polydipsia. Musculoskeletal: Denies myalgias, back pain, joint swelling, arthralgias and gait problem.  Skin: Denies pallor, rash and wound.  Neurological: Denies dizziness, seizures, syncope, weakness, light-headedness, numbness and headaches.  Hematological: Denies adenopathy. Easy bruising, personal or family bleeding history  Psychiatric/Behavioral: Denies suicidal ideation, mood changes, confusion,  nervousness, sleep disturbance and agitation    Physical Exam: Vitals:   06/30/21 1349  BP: 110/76  Pulse: (!) 55  Temp: 98.1 F (36.7 C)  TempSrc: Oral  SpO2: 99%  Weight: 149 lb 1.6 oz (67.6 kg)  Height: 5\' 5"  (1.651 m)   Body mass index is 24.81 kg/m.  Constitutional: NAD, calm, comfortable Eyes: PERRL, lids and conjunctivae normal, wears corrective lenses ENMT: Mucous membranes are moist.  Respiratory: clear to auscultation bilaterally, no wheezing, no crackles. Normal respiratory effort. No accessory muscle use.  Cardiovascular: Regular rate and rhythm, no murmurs / rubs / gallops. No extremity edema.  Neurologic: Grossly intact and nonfocal Psychiatric: Normal judgment and insight. Alert and oriented x 3. Normal mood.    Impression and Plan:  Atrial fibrillation, paroxysmal -Appears to be in sinus rhythm today, she is followed by the A. fib clinic, on aspirin, diltiazem and metoprolol. -Followed by the A. fib clinic  Mild episode of recurrent major depressive disorder ([place]) -Mood is stable on Celexa.     Time spent: 31 minutes reviewing chart, interviewing and examining patient and formulating plan of care.     Lelon Frohlich, MD [facility]

## 2021-07-05 ENCOUNTER — Ambulatory Visit: Payer: 59 | Admitting: Psychology

## 2021-07-06 ENCOUNTER — Encounter: Payer: Self-pay | Admitting: Internal Medicine

## 2021-07-12 ENCOUNTER — Other Ambulatory Visit (HOSPITAL_COMMUNITY): Payer: Self-pay

## 2021-07-12 DIAGNOSIS — J300 Vasomotor rhinitis: Secondary | ICD-10-CM | POA: Diagnosis not present

## 2021-07-12 DIAGNOSIS — J309 Allergic rhinitis, unspecified: Secondary | ICD-10-CM | POA: Diagnosis not present

## 2021-07-12 DIAGNOSIS — H1045 Other chronic allergic conjunctivitis: Secondary | ICD-10-CM | POA: Diagnosis not present

## 2021-07-12 MED ORDER — AZELASTINE HCL 0.1 % NA SOLN
1.0000 | Freq: Two times a day (BID) | NASAL | 3 refills | Status: DC
  Filled 2021-07-12: qty 30, 25d supply, fill #0

## 2021-07-12 MED ORDER — FLUTICASONE PROPIONATE 50 MCG/ACT NA SUSP
1.0000 | Freq: Every day | NASAL | 3 refills | Status: DC
  Filled 2021-07-12: qty 16, 30d supply, fill #0
  Filled 2021-09-29: qty 16, 30d supply, fill #1

## 2021-07-12 MED ORDER — MOMETASONE FUROATE 50 MCG/ACT NA SUSP
1.0000 | Freq: Every day | NASAL | 3 refills | Status: DC
  Filled 2021-07-12: qty 17, 30d supply, fill #0

## 2021-07-12 MED ORDER — AZELASTINE HCL 0.05 % OP SOLN
1.0000 [drp] | Freq: Two times a day (BID) | OPHTHALMIC | 3 refills | Status: DC
  Filled 2021-07-12: qty 6, 30d supply, fill #0

## 2021-07-15 ENCOUNTER — Ambulatory Visit: Payer: 59 | Attending: Internal Medicine

## 2021-07-15 ENCOUNTER — Other Ambulatory Visit (HOSPITAL_BASED_OUTPATIENT_CLINIC_OR_DEPARTMENT_OTHER): Payer: Self-pay

## 2021-07-15 DIAGNOSIS — Z23 Encounter for immunization: Secondary | ICD-10-CM

## 2021-07-15 MED ORDER — PFIZER COVID-19 VAC BIVALENT 30 MCG/0.3ML IM SUSP
INTRAMUSCULAR | 0 refills | Status: DC
  Filled 2021-07-15: qty 0.3, 1d supply, fill #0

## 2021-07-15 NOTE — Progress Notes (Signed)
   Covid-19 Vaccination Clinic  Name:  Kara Warren    MRN: [id number] DOB: April 21, 1956  07/15/2021  [name] was observed post Covid-19 immunization for 15 minutes without incident. She was provided with Vaccine Information Sheet and instruction to access the V-Safe system.   [name] was instructed to call 911 with any severe reactions post vaccine: Difficulty breathing  Swelling of face and throat  A fast heartbeat  A bad rash all over body  Dizziness and weakness

## 2021-07-19 ENCOUNTER — Other Ambulatory Visit (HOSPITAL_COMMUNITY): Payer: Self-pay

## 2021-07-19 DIAGNOSIS — R140 Abdominal distension (gaseous): Secondary | ICD-10-CM | POA: Diagnosis not present

## 2021-07-19 DIAGNOSIS — Z800 Family history of malignant neoplasm of digestive organs: Secondary | ICD-10-CM | POA: Diagnosis not present

## 2021-07-19 DIAGNOSIS — R1033 Periumbilical pain: Secondary | ICD-10-CM | POA: Diagnosis not present

## 2021-07-19 DIAGNOSIS — K219 Gastro-esophageal reflux disease without esophagitis: Secondary | ICD-10-CM | POA: Diagnosis not present

## 2021-07-19 MED ORDER — SUCRALFATE 1 G PO TABS
1.0000 g | ORAL_TABLET | Freq: Four times a day (QID) | ORAL | 0 refills | Status: DC
  Filled 2021-07-19: qty 8, 2d supply, fill #0
  Filled 2021-07-20: qty 112, 28d supply, fill #0

## 2021-07-20 ENCOUNTER — Other Ambulatory Visit (HOSPITAL_COMMUNITY): Payer: Self-pay

## 2021-07-21 ENCOUNTER — Ambulatory Visit: Payer: 59 | Admitting: Psychology

## 2021-08-02 ENCOUNTER — Ambulatory Visit: Payer: 59 | Admitting: Family Medicine

## 2021-08-02 ENCOUNTER — Other Ambulatory Visit: Payer: Self-pay

## 2021-08-02 VITALS — BP 130/70 | HR 60 | Temp 98.1°F | Wt 150.5 lb

## 2021-08-02 DIAGNOSIS — Z23 Encounter for immunization: Secondary | ICD-10-CM

## 2021-08-02 DIAGNOSIS — R0789 Other chest pain: Secondary | ICD-10-CM | POA: Diagnosis not present

## 2021-08-02 NOTE — Patient Instructions (Signed)
Let me know if pain persists or worsens and we can get some x-rays.

## 2021-08-02 NOTE — Progress Notes (Signed)
Established Patient Office Visit  Subjective:  Patient ID: Kara Warren, female    DOB: Mar 28, 1956  Age: 65 y.o. MRN: [id number]  CC:  Chief Complaint  Patient presents with   Flank Pain    Left side under the arm and under the breast, no painful but some discomfort, x 1 week, thinks this may be a pulled muscle    HPI Kara Warren presents for approximately 1 week history of poorly localized pain underneath her left breast radiating somewhat toward the back.  She actually is just establishing with this practice.  Will be seeing Dr. Jerilee Hoh for physical soon.  She describes the current pain as a dull ache.  Again very poorly localized.  No pleuritic pain.  No cough.  No fevers or chills.  No rash.  Denies any recent injury.  She had recent mammogram that was unremarkable.  She has no exertional chest pain.  She had coronary calcium score of 0 recently. No abdominal pain.  No appetite or weight changes.  No axillary adenopathy.  Taken some Tylenol but pain is really somewhat minimal and somewhat intermittent.  No clear triggering factors.  Past Medical History:  Diagnosis Date   Abdominal pain, left upper quadrant    Acute bronchospasm    Acute neck pain 07/02/2019   Acute sinusitis, unspecified 05/20/2009   Centricity Description: SINUSITIS - ACUTE-NOS Qualifier: Diagnosis of  By: Birdie Riddle MD, Belenda Cruise   Centricity Description: ACUTE SINUSITIS, UNSPECIFIED Qualifier: Diagnosis of  By: Alveta Heimlich MD, Cornelia Copa   Centricity Description: SINUSITIS, ACUTE Qualifier: Diagnosis of  By: Koleen Nimrod MD, Dellis Filbert     Atypical chest pain 03/05/2013   Body mass index (BMI) of 23.0-23.9 in adult    Cervicalgia    Colon polyp    Costochondral junction syndrome    Cough    DEPRESSION 08/25/2008   Qualifier: Diagnosis of  By: Ronnald Ramp CMA, Chemira     Diarrhea    Diverticulosis 07/27/2012   Dorsalgia    Elevated blood pressure reading without diagnosis of hypertension    Endometrial polyp 8/08   benign    Essential hypertension 09/20/2020   Family history of osteoporosis 12/28/2011   Mother pt had normal Dexa 11/2009    Fatigue    GERD (gastroesophageal reflux disease)    HIP PAIN, RIGHT 04/12/2009   Qualifier: Diagnosis of  By: Oneida Alar MD, KARL     History of hiatal hernia    HYPERLIPIDEMIA 08/25/2008   Qualifier: Diagnosis of  By: Birdie Riddle MD, Belenda Cruise     IBS (irritable bowel syndrome)    Left hip pain 07/30/2013   Left wrist pain 07/30/2013   Lumbar strain, initial encounter 07/28/2019   LUNG NODULE 11/19/2009   Neg CT     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 Please review for accuracy.   Otalgia, unspecified ear    Pain in throat    Palpitations    Paroxysmal atrial fibrillation (HCC)    Peroneal tendinitis of right lower extremity 03/12/2014   Plantar wart  04/02/2014   Pneumonia    RHINITIS 06/21/2009   Qualifier: Diagnosis of  By: Birdie Riddle MD, Belenda Cruise     Right ankle injury, subsequent encounter 01/11/2018   Right foot pain 08/09/2011   Sinusitis, chronic    Skene's gland abscess 2009   patient unaware   Sprain of unspecified ligament of right ankle, subsequent encounter    TRANSAMINASES, SERUM, ELEVATED 12/07/2009   Qualifier: Diagnosis of  By: Inda Castle FNP, Melissa S    Unspecified cataract    Urinary tract infection, site not specified    Vitamin D deficiency     Past Surgical History:  Procedure Laterality Date   COLONOSCOPY     DILATION AND CURETTAGE OF UTERUS  8/08   GYNECOLOGIC CRYOSURGERY  1980   HYSTEROSCOPY WITH D & C N/A  03/30/2017   Procedure: DILATATION & CURETTAGE/HYSTEROSCOPY WITH ULTRASOUND GUIDANCE;  Surgeon: Megan Salon, MD;  Location: [facility];  Service: Gynecology;  Laterality: N/A;   UPPER GI ENDOSCOPY     uterine ablation  2007    Family History  Problem Relation Age of Onset   Colon polyps Father    Ulcerative colitis Father    CAD Father        Stent placement at age 59   Glaucoma Father    Hypertension Father    Colitis Father    Breast cancer Paternal Grandmother    Colon cancer Maternal Grandfather    Colon polyps Mother    Osteoporosis Mother    Thyroid disease Mother        hypo   Glaucoma Mother    Diverticulosis Mother    Sudden death Neg Hx    Hyperlipidemia Neg Hx    Heart attack Neg Hx    Diabetes Neg Hx     Social History   Socioeconomic History   Marital status: Single    Spouse name: Not on file   Number of children: 0   Years of education: Not on file   Highest education level: Not on file  Occupational History   Occupation: Therapist, sports    Employer:   Tobacco Use   Smoking status: Former    Packs/day: 0.10    Years: 3.00    Pack years: 0.30    Types: Cigarettes    Quit date: 10/02/1978    Years since quitting: 42.8   Smokeless tobacco: Never  Vaping Use   Vaping Use: Never used  Substance and Sexual Activity   Alcohol use: Yes    Alcohol/week: 7.0 standard drinks    Types: 7 Glasses of wine per week    Comment: per week   Drug use: No   Sexual activity: Not Currently    Birth control/protection: Post-menopausal  Other Topics Concern   Not on file  Social History Narrative   Not on file   Social Determinants of Health   Financial Resource Strain: Not on file  Food Insecurity: Not on file  Transportation Needs: Not on file  Physical Activity: Not on file  Stress: Not on file  Social Connections: Not on file  Intimate Partner Violence: Not on file    Outpatient Medications Prior to Visit  Medication Sig Dispense Refill   Ascorbic  Acid (VITAMIN C) 1000 MG tablet Take 500 mg by mouth 3 (three) times a week.     aspirin EC 81 MG tablet Take 81 mg by mouth daily. Swallow whole.     azelastine (ASTELIN) 0.1 % nasal spray Place 1 spray  into both nostrils daily as needed for allergies.     azelastine (ASTELIN) 0.1 % nasal spray Place 1-2 sprays into both nostrils 2 (two) times daily. 30 mL 3   azelastine (OPTIVAR) 0.05 % ophthalmic solution Place 1 drop into affected eye(s) 2 (two) times daily. 6 mL 3   citalopram (CELEXA) 20 MG tablet Take 1.5 tablets (30 mg total) by mouth daily. (Patient taking differently: Take 20 mg by mouth daily.) 145 tablet 3   COVID-19 mRNA bivalent vaccine, Pfizer, (PFIZER COVID-19 VAC BIVALENT) injection Inject into the muscle. 0.3 mL 0   diltiazem (CARDIZEM) 30 MG tablet TAKE 1 TABLET BY MOUTH 2 TIMES A DAY IF NEEDED FOR AFIB 45 tablet 1   fluticasone (FLONASE) 50 MCG/ACT nasal spray Place 1-2 sprays into both nostrils daily. 16 g 3   metoprolol succinate (TOPROL-XL) 25 MG 24 hr tablet TAKE 1 TABLET BY MOUTH DAILY 90 tablet 3   mometasone (NASONEX) 50 MCG/ACT nasal spray Place 1-2 sprays into each nostril daily. 17 g 3   montelukast (SINGULAIR) 10 MG tablet Take 1 tablet (10 mg total) by mouth daily. 90 tablet 3   Multiple Vitamins-Minerals (MULTI FOR HER 50+) TABS Take 1 tablet by mouth daily.     Omega-3 Fatty Acids (FISH OIL) 1000 MG CAPS Take 1,000 mg by mouth daily.     omeprazole (PRILOSEC) 20 MG capsule TAKE 1 CAPSULE BY MOUTH 30 MINUTES BEFORE MORNING MEAL 90 capsule 2   Probiotic Product (PROBIOTIC PO) Take 1 capsule by mouth daily. Called ultra spectrum     sucralfate (CARAFATE) 1 g tablet Take 1 tablet (1 g total) by mouth 4 (four) times daily.(between meals & at bedtime) 120 tablet 0   fluticasone (FLONASE) 50 MCG/ACT nasal spray Place 2 sprays into the nose daily. 2 sprays into nostril daily 16 g 1   No facility-administered medications prior to visit.    Allergies  Allergen Reactions    Augmentin [Amoxicillin-Pot Clavulanate] Diarrhea    ROS Review of Systems  Constitutional:  Negative for appetite change, chills, fever and unexpected weight change.  Respiratory:  Negative for cough and shortness of breath.   Cardiovascular:  Negative for chest pain.  Skin:  Negative for rash.  Hematological:  Negative for adenopathy.     Objective:    Physical Exam Cardiovascular:     Rate and Rhythm: Normal rate and regular rhythm.  Pulmonary:     Effort: Pulmonary effort is normal.     Breath sounds: Normal breath sounds. No wheezing or rales.     Comments: Left chest wall is examined with nurse present.  No visible rashes.  No localized tenderness. Musculoskeletal:     Comments: Left thoracic and chest wall area examined with nurse present.  No visible rashes.  No visible swelling.  No ecchymosis.  No localized tenderness in the rib cage area.  No left axillary adenopathy. No left upper quadrant tenderness.  No splenomegaly.  Skin:    Findings: No rash.  Neurological:     Mental Status: She is alert.    BP 130/70 (BP Location: Left Arm, Patient Position: Sitting, Cuff Size: Normal)   Pulse 60   Temp 98.1 F (36.7 C) (Oral)   Wt 150 lb 8 oz (68.3 kg)   LMP 10/02/2006 (Approximate)   SpO2 99%   BMI 25.04 kg/m  Wt Readings from Last 3 Encounters:  08/02/21 150 lb 8 oz (68.3 kg)  06/30/21 149 lb 1.6 oz (67.6 kg)  06/08/21  145 lb (65.8 kg)     Health Maintenance Due  Topic Date Due   URINE MICROALBUMIN  Never done   Hepatitis C Screening  Never done   Pneumonia Vaccine 51+ Years old (2 - PPSV23 if available, else PCV20) 10/03/2015    There are no preventive care reminders to display for this patient.  Lab Results  Component Value Date   TSH 0.926 06/15/2014   Lab Results  Component Value Date   WBC 5.2 09/21/2020   HGB 12.4 09/21/2020   HCT 37.8 09/21/2020   MCV 97.7 09/21/2020   PLT 298 09/21/2020   Lab Results  Component Value Date   NA 136  09/21/2020   K 3.9 09/21/2020   CO2 21 (L) 09/21/2020   GLUCOSE 84 09/21/2020   BUN 6 (L) 09/21/2020   CREATININE 0.70 09/21/2020   BILITOT 0.9 05/30/2020   ALKPHOS 52 05/30/2020   AST 17 05/30/2020   ALT 18 05/30/2020   PROT 7.4 05/30/2020   ALBUMIN 4.2 05/30/2020   CALCIUM 9.5 09/21/2020   ANIONGAP 13 09/21/2020   Lab Results  Component Value Date   CHOL 205 (H) 03/16/2014   Lab Results  Component Value Date   HDL 103 03/16/2014   Lab Results  Component Value Date   LDLCALC 80 03/16/2014   Lab Results  Component Value Date   TRIG 108 03/16/2014   Lab Results  Component Value Date   CHOLHDL 2.0 03/16/2014   No results found for: HGBA1C    Assessment & Plan:   1 week history of poorly localized left chest wall pain.  This does not sound likely to be pulmonary related.  No pleuritic pain.  No dyspnea.  Does not any red flags such as exertional pain, dyspnea, cough, pleuritic pain, fever, appetite change, weight loss etc. does not recall any obvious musculoskeletal injury.  -We recommend observation for now and consider over-the-counter anti-inflammatory as needed.  She has scheduled follow-up for physical with her new primary but this is not until January. -If pain persists or worsens consider chest x-ray and possible left sided rib films to further investigate -Patient is requesting Prevnar 20 and this will be given today  No orders of the defined types were placed in this encounter.   Follow-up: No follow-ups on file.    Carolann Littler, MD

## 2021-08-04 ENCOUNTER — Ambulatory Visit: Payer: 59 | Admitting: Psychology

## 2021-08-16 DIAGNOSIS — Z1211 Encounter for screening for malignant neoplasm of colon: Secondary | ICD-10-CM | POA: Diagnosis not present

## 2021-08-16 DIAGNOSIS — Z800 Family history of malignant neoplasm of digestive organs: Secondary | ICD-10-CM | POA: Diagnosis not present

## 2021-08-16 DIAGNOSIS — Z86010 Personal history of colonic polyps: Secondary | ICD-10-CM | POA: Diagnosis not present

## 2021-08-16 DIAGNOSIS — K219 Gastro-esophageal reflux disease without esophagitis: Secondary | ICD-10-CM | POA: Diagnosis not present

## 2021-08-21 ENCOUNTER — Encounter: Payer: Self-pay | Admitting: Internal Medicine

## 2021-08-22 ENCOUNTER — Ambulatory Visit: Payer: Self-pay

## 2021-08-22 ENCOUNTER — Ambulatory Visit: Payer: 59 | Admitting: Family Medicine

## 2021-08-22 ENCOUNTER — Encounter: Payer: Self-pay | Admitting: Family Medicine

## 2021-08-22 VITALS — BP 122/78 | Ht 65.0 in | Wt 140.0 lb

## 2021-08-22 DIAGNOSIS — M79671 Pain in right foot: Secondary | ICD-10-CM

## 2021-08-22 NOTE — Progress Notes (Signed)
PCP: Isaac Bliss, Rayford Halsted, MD  Subjective:   HPI: Patient is a 65 y.o. female here for right foot injury.  Patient reports just over a week ago she accidentally slid foot under her rocking chair causing severe pain, swelling, bruising of dorsal right foot. These have persisted since that time and able to walk though with difficulty. Has been icing. Describes pain at times as a burning.  Past Medical History:  Diagnosis Date   Abdominal pain, left upper quadrant    Acute bronchospasm    Acute neck pain 07/02/2019   Acute sinusitis, unspecified 05/20/2009   Centricity Description: SINUSITIS - ACUTE-NOS Qualifier: Diagnosis of  By: Birdie Riddle MD, Belenda Cruise   Centricity Description: ACUTE SINUSITIS, UNSPECIFIED Qualifier: Diagnosis of  By: Alveta Heimlich MD, Cornelia Copa   Centricity Description: SINUSITIS, ACUTE Qualifier: Diagnosis of  By: Koleen Nimrod MD, Dellis Filbert     Atypical chest pain 03/05/2013   Body mass index (BMI) of 23.0-23.9 in adult    Cervicalgia    Colon polyp    Costochondral junction syndrome    Cough    DEPRESSION 08/25/2008   Qualifier: Diagnosis of  By: Ronnald Ramp CMA, Chemira     Diarrhea    Diverticulosis 07/27/2012   Dorsalgia    Elevated blood pressure reading without diagnosis of hypertension    Endometrial polyp 8/08   benign   Essential hypertension 09/20/2020   Family history of osteoporosis 12/28/2011   Mother pt had normal Dexa 11/2009    Fatigue    GERD (gastroesophageal reflux disease)    HIP PAIN, RIGHT 04/12/2009   Qualifier: Diagnosis of  By: Oneida Alar MD, KARL     History of hiatal hernia    HYPERLIPIDEMIA 08/25/2008   Qualifier: Diagnosis of  By: Birdie Riddle MD, Belenda Cruise     IBS (irritable bowel syndrome)    Left hip pain 07/30/2013   Left wrist pain 07/30/2013   Lumbar strain, initial encounter 07/28/2019   LUNG NODULE 11/19/2009   Neg CT     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 Please review for accuracy.   Otalgia, unspecified ear    Pain in throat    Palpitations    Paroxysmal atrial fibrillation (HCC)    Peroneal tendinitis of right lower extremity 03/12/2014   Plantar wart 04/02/2014   Pneumonia    RHINITIS 06/21/2009   Qualifier: Diagnosis of  By: Birdie Riddle MD, Belenda Cruise     Right ankle injury, subsequent encounter 01/11/2018   Right foot pain 08/09/2011   Sinusitis, chronic    Skene's gland abscess 2009   patient unaware   Sprain of unspecified ligament of right ankle, subsequent encounter    TRANSAMINASES, SERUM, ELEVATED 12/07/2009   Qualifier: Diagnosis of  By: Inda Castle FNP, Melissa S    Unspecified cataract    Urinary tract infection, site not specified    Vitamin D deficiency     Current Outpatient Medications on File Prior to Visit  Medication Sig Dispense Refill   Ascorbic Acid (VITAMIN C) 1000 MG tablet Take 500 mg by  mouth 3 (three) times a week.     aspirin EC 81 MG tablet Take 81 mg by mouth daily. Swallow whole.     azelastine (ASTELIN) 0.1 % nasal spray Place 1 spray into both nostrils daily as needed for allergies.     azelastine (ASTELIN) 0.1 % nasal spray Place 1-2 sprays into both nostrils 2 (two) times daily. 30 mL 3   azelastine (OPTIVAR) 0.05 % ophthalmic solution Place 1 drop into affected eye(s) 2 (two) times daily. 6 mL 3   citalopram (CELEXA) 20 MG tablet Take 1.5 tablets (30 mg total) by mouth daily. (Patient taking differently: Take 20 mg by mouth daily.) 145 tablet 3   COVID-19 mRNA bivalent vaccine, Pfizer, (PFIZER COVID-19 VAC BIVALENT) injection Inject into the muscle. 0.3 mL 0    diltiazem (CARDIZEM) 30 MG tablet TAKE 1 TABLET BY MOUTH 2 TIMES A DAY IF NEEDED FOR AFIB 45 tablet 1   fluticasone (FLONASE) 50 MCG/ACT nasal spray Place 2 sprays into the nose daily. 2 sprays into nostril daily 16 g 1   fluticasone (FLONASE) 50 MCG/ACT nasal spray Place 1-2 sprays into both nostrils daily. 16 g 3   metoprolol succinate (TOPROL-XL) 25 MG 24 hr tablet TAKE 1 TABLET BY MOUTH DAILY 90 tablet 3   mometasone (NASONEX) 50 MCG/ACT nasal spray Place 1-2 sprays into each nostril daily. 17 g 3   montelukast (SINGULAIR) 10 MG tablet Take 1 tablet (10 mg total) by mouth daily. 90 tablet 3   Multiple Vitamins-Minerals (MULTI FOR HER 50+) TABS Take 1 tablet by mouth daily.     Omega-3 Fatty Acids (FISH OIL) 1000 MG CAPS Take 1,000 mg by mouth daily.     omeprazole (PRILOSEC) 20 MG capsule TAKE 1 CAPSULE BY MOUTH 30 MINUTES BEFORE MORNING MEAL 90 capsule 2   Probiotic Product (PROBIOTIC PO) Take 1 capsule by mouth daily. Called ultra spectrum     sucralfate (CARAFATE) 1 g tablet Take 1 tablet (1 g total) by mouth 4 (four) times daily.(between meals & at bedtime) 120 tablet 0   No current facility-administered medications on file prior to visit.    Past Surgical History:  Procedure Laterality Date   COLONOSCOPY     DILATION AND CURETTAGE OF UTERUS  8/08   GYNECOLOGIC CRYOSURGERY  1980   HYSTEROSCOPY WITH D & C N/A 03/30/2017   Procedure: DILATATION & CURETTAGE/HYSTEROSCOPY WITH ULTRASOUND GUIDANCE;  Surgeon: Megan Salon, MD;  Location: [facility];  Service: Gynecology;  Laterality: N/A;   UPPER GI ENDOSCOPY     uterine ablation  2007    Allergies  Allergen Reactions   Augmentin [Amoxicillin-Pot Clavulanate] Diarrhea    BP 122/78   Ht 5\' 5"  (1.651 m)   Wt 140 lb (63.5 kg)   LMP 10/02/2006 (Approximate)   BMI 23.30 kg/m   [facility] Adult Exercise 08/30/2020 06/08/2021 08/22/2021  Frequency of aerobic exercise (# of days/week) 7 7 7   Average time in minutes 60 60 60   Frequency of strengthening activities (# of days/week) 3 3 2     No flowsheet data found.      Objective:  Physical Exam:  Gen: NAD, comfortable in exam room  Right foot/ankle: Localized swelling dorsally over proximal 2nd metatarsal.  Bruising distal foot with some swelling.  No other deformity. FROM ankle without pain. TTP dorsal foot greatest over 2nd and 3rd metatarsals, swollen area above. Negative ant drawer and negative talar tilt.   Negative metatarsal compression. Thompsons test negative.  NV intact distally.   Limited MSK u/s right foot:  Small hematoma dorsal foot over 2nd metatarsal.  No cortical irregularity, edema overlying cortex of 2nd, 3rd metatarsals.  Assessment & Plan:  1. Right foot contusion - with hematoma.  Icing, compression, voltaren gel.  Ultrasound reassuring.  F/u prn.

## 2021-08-22 NOTE — Patient Instructions (Signed)
Your ultrasound is reassuring. You have a hematoma on the top of your foot from the injury. Ice 15 minutes at a time 3-4 times a day. Activities as tolerated. When you're able to I would use the sleeve for compression of the area which will help this resolve quicker. Topical voltaren gel if needed. Follow up with me as needed.

## 2021-08-23 ENCOUNTER — Other Ambulatory Visit (HOSPITAL_COMMUNITY): Payer: Self-pay

## 2021-08-23 MED ORDER — CITALOPRAM HYDROBROMIDE 20 MG PO TABS
30.0000 mg | ORAL_TABLET | Freq: Every day | ORAL | 0 refills | Status: DC
  Filled 2021-08-23: qty 135, 90d supply, fill #0
  Filled 2022-01-24: qty 135, 90d supply, fill #1

## 2021-08-23 MED ORDER — FAMOTIDINE 10 MG PO TABS
10.0000 mg | ORAL_TABLET | Freq: Two times a day (BID) | ORAL | 0 refills | Status: DC
Start: 2021-08-23 — End: 2022-04-19
  Filled 2021-08-23: qty 90, 45d supply, fill #0

## 2021-08-23 MED ORDER — METOPROLOL SUCCINATE ER 25 MG PO TB24
25.0000 mg | ORAL_TABLET | Freq: Every day | ORAL | 0 refills | Status: DC
  Filled 2021-08-23 – 2021-09-14 (×2): qty 90, 90d supply, fill #0

## 2021-08-23 MED ORDER — OMEPRAZOLE 20 MG PO CPDR
20.0000 mg | DELAYED_RELEASE_CAPSULE | Freq: Every morning | ORAL | 0 refills | Status: DC
  Filled 2021-08-23: qty 90, 90d supply, fill #0

## 2021-08-27 DIAGNOSIS — Z79899 Other long term (current) drug therapy: Secondary | ICD-10-CM | POA: Diagnosis not present

## 2021-08-27 DIAGNOSIS — R002 Palpitations: Secondary | ICD-10-CM | POA: Diagnosis not present

## 2021-08-27 DIAGNOSIS — I4891 Unspecified atrial fibrillation: Secondary | ICD-10-CM | POA: Diagnosis not present

## 2021-08-27 DIAGNOSIS — R079 Chest pain, unspecified: Secondary | ICD-10-CM | POA: Diagnosis not present

## 2021-08-31 NOTE — Progress Notes (Signed)
Office Visit    Patient Name: Kara Warren Date of Encounter: 09/01/2021  PCP:  Kara Warren, Kara Halsted, MD   [place]  Cardiologist:  Kara Chandler, MD  Advanced Practice Provider:  No care team member to display Electrophysiologist:  None      Chief Complaint    Kara Warren is a 65 y.o. female with a hx of paroxysmal atrial fibrillation, depression, irritable bowel syndrome, hiatal hernia, GERD, HLD presents today for ED visit follow up.   Past Medical History    Past Medical History:  Diagnosis Date   Abdominal pain, left upper quadrant    Acute bronchospasm    Acute neck pain 07/02/2019   Acute sinusitis, unspecified 05/20/2009   Centricity Description: SINUSITIS - ACUTE-NOS Qualifier: Diagnosis of  By: Kara Riddle MD, Kara Warren   Centricity Description: ACUTE SINUSITIS, UNSPECIFIED Qualifier: Diagnosis of  By: Kara Heimlich MD, Kara Warren   Centricity Description: SINUSITIS, ACUTE Qualifier: Diagnosis of  By: Kara Nimrod MD, Kara Warren     Atypical chest pain 03/05/2013   Body mass index (BMI) of 23.0-23.9 in adult    Cervicalgia    Colon polyp    Costochondral junction syndrome    Cough    DEPRESSION 08/25/2008   Qualifier: Diagnosis of  By: Kara Warren CMA, Kara Warren     Diarrhea    Diverticulosis 07/27/2012   Dorsalgia    Elevated blood pressure reading without diagnosis of hypertension    Endometrial polyp 8/08   benign   Essential hypertension 09/20/2020   Family history of osteoporosis 12/28/2011   Mother pt had normal Dexa 11/2009    Fatigue    GERD (gastroesophageal reflux disease)    HIP PAIN, RIGHT 04/12/2009   Qualifier: Diagnosis of  By: Kara Alar MD, Kara Warren     History of hiatal hernia    HYPERLIPIDEMIA 08/25/2008   Qualifier: Diagnosis of  By: Kara Riddle MD, Kara Warren     IBS (irritable bowel syndrome)    Left hip pain 07/30/2013   Left wrist pain 07/30/2013   Lumbar strain, initial encounter 07/28/2019   LUNG NODULE 11/19/2009   Neg CT      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 Please review for accuracy.   Otalgia, unspecified ear    Pain in throat    Palpitations    Paroxysmal atrial fibrillation (HCC)    Peroneal tendinitis of right lower extremity 03/12/2014   Plantar wart 04/02/2014   Pneumonia    RHINITIS 06/21/2009   Qualifier: Diagnosis of  By: Kara Riddle MD, Kara Warren     Right ankle injury, subsequent encounter 01/11/2018   Right foot pain 08/09/2011   Sinusitis, chronic    Skene'Warren gland abscess 2009   patient unaware   Sprain of unspecified ligament of right ankle, subsequent encounter    TRANSAMINASES, SERUM, ELEVATED 12/07/2009   Qualifier: Diagnosis of  By: Kara Castle FNP, Kara Warren    Unspecified cataract    Urinary tract infection, site not specified    Vitamin D deficiency  Past Surgical History:  Procedure Laterality Date   COLONOSCOPY     DILATION AND CURETTAGE OF UTERUS  8/08   GYNECOLOGIC CRYOSURGERY  1980   HYSTEROSCOPY WITH D & C N/A 03/30/2017   Procedure: DILATATION & CURETTAGE/HYSTEROSCOPY WITH ULTRASOUND GUIDANCE;  Surgeon: Kara Salon, MD;  Location: [facility];  Service: Gynecology;  Laterality: N/A;   UPPER GI ENDOSCOPY     uterine ablation  2007    Allergies  Allergies  Allergen Reactions   Augmentin [Amoxicillin-Pot Clavulanate] Diarrhea    History of Present Illness    [name] is a 65 y.o. female with a hx of paroxysmal atrial fibrillation, depression, irritable bowel syndrome, hiatal hernia, GERD, HLD last seen 03/29/21 by Kara Husk,  PA.  Prior echo 09/04/19 with normal LVEF 41-96%, grade 1 diastolic dysfunction, no significant valvular abnormalities. Cardiac monitor with atrial fibrillation, SVT, and one 7 beat run of VT. Started on Eliquis January 2021. She was seen in atrial fibrillation clinic and Eliquis stopped. She was seen by Dr. Angelena Form 03/31/20 and as had turned 56 with CHADS2VASc of 2 was discussed with atrial fibrillation clinic with most recent guidelines stating females with CHADS2VASc of 2 do not have to be on eliquis. Aspirin 81mg  daily initiated with plans to start Eliquis for recurrent atrial fibrillation.  She was last seen 03/29/21 by Kara Husk, PA. At that time she noted occasional palpitations not long enough to require cardizem. They improved with deep breath.   Seen in [facility] ED [facility] at [facility] due to palpitations while driving. She felt near syncopal. No chest pain nor shortness of breath. She was in NSR, troponin negative x2.  08/27/21 lab work at ED:  Troponin T 6 and 8 K 4.1, creatinine 0.8, AST 26, ALT 24 Hb 14.2  She presents today for follow up.  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.  She plans to retire at the end of January.  When describing the episode of palpitations tells me she felt her heart racing as if she might pass out.  She does not recall a similar episode.  She was driving home from visiting family and stopped at the ED due to feeling so poorly and being alone in the car with concern for safety.  She drinks 1 cup of caffeinated coffee every morning.  She endorses staying well-hydrated.  No recent stress.  She did try deep breathing during the episode but it did not help.  Lasted about 30 minutes.  She did not take her as needed diltiazem.  Tells me she has never taken.  Otherwise has only had brief episodes of palpitations which self resolved. Reports no shortness of breath nor dyspnea on exertion. Reports no chest pain,  pressure, or tightness. No edema, orthopnea, PND.  EKGs/Labs/Other Studies Reviewed:   The following studies were reviewed today:   Monitor 10/07/19 Study Highlights   Sinus rhythm with sinus bradycardia Atrial fibrillation/flutter noted. Longest duration 2 hours, 25 minutes. Several short bursts of supraventricular tachycardia One 7 beat run of ventricular tachycardia.   Echo 09/2019 IMPRESSIONS     1. Left ventricular ejection fraction, by visual estimation, is 60 to  65%. The left ventricle has normal function. Left ventricular septal wall  thickness was mildly increased. Mildly increased left ventricular  posterior wall thickness. There is mildly  increased left ventricular hypertrophy.   2. Left ventricular diastolic parameters are consistent with Grade I  diastolic dysfunction (impaired  relaxation).   3. Global right ventricle has normal systolic function.The right  ventricular size is normal. No increase in right ventricular wall  thickness.   4. Left atrial size was moderately dilated.   5. Right atrial size was normal.   6. The mitral valve is normal in structure. No evidence of mitral valve  regurgitation. No evidence of mitral stenosis.   7. The tricuspid valve is normal in structure. Tricuspid valve  regurgitation is trivial.   8. The aortic valve is tricuspid. Aortic valve regurgitation is not  visualized. No evidence of aortic valve sclerosis or stenosis.   9. The pulmonic valve was normal in structure. Pulmonic valve  regurgitation is trivial.  10. Normal pulmonary artery systolic pressure.  11. The inferior vena cava is normal in size with greater than 50%  respiratory variability, suggesting right atrial pressure of 3 mmHg.   EKG:  EKG is ordered today.  The ekg ordered today demonstrates sinus bradycardia 55 bpm.   Recent Labs: 09/21/2020: BUN 6; Creatinine, Ser 0.70; Hemoglobin 12.4; Platelets 298; Potassium 3.9; Sodium 136  Recent Lipid Panel     Component Value Date/Time   CHOL 205 (H) 03/16/2014 1152   TRIG 108 03/16/2014 1152   HDL 103 03/16/2014 1152   CHOLHDL 2.0 03/16/2014 1152   VLDL 22 03/16/2014 1152   LDLCALC 80 03/16/2014 1152    Risk Assessment/Calculations:   CHA2DS2-VASc Score = 2   This indicates a 2.2% annual risk of stroke. The patient'Warren score is based upon: CHF History: 0 HTN History: 0 Diabetes History: 0 Stroke History: 0 Vascular Disease History: 0 Age Score: 1 Gender Score: 1      Home Medications   Current Meds  Medication Sig   Ascorbic Acid (VITAMIN C) 1000 MG tablet Take 500 mg by mouth 3 (three) times a week.   aspirin EC 81 MG tablet Take 81 mg by mouth daily. Swallow whole.   azelastine (ASTELIN) 0.1 % nasal spray Place 1 spray into both nostrils daily as needed for allergies.   azelastine (ASTELIN) 0.1 % nasal spray Place 1-2 sprays into both nostrils 2 (two) times daily.   citalopram (CELEXA) 20 MG tablet Take 1.5 tablets (30 mg total) by mouth daily.   COVID-19 mRNA bivalent vaccine, Pfizer, (PFIZER COVID-19 VAC BIVALENT) injection Inject into the muscle.   diltiazem (CARDIZEM) 30 MG tablet TAKE 1 TABLET BY MOUTH 2 TIMES A DAY IF NEEDED FOR AFIB   famotidine (PEPCID) 10 MG tablet Take 1 tablet (10 mg total) by mouth 2 (two) times daily.   metoprolol succinate (TOPROL-XL) 25 MG 24 hr tablet Take 1 tablet (25 mg total) by mouth daily.   mometasone (NASONEX) 50 MCG/ACT nasal spray Place 1-2 sprays into each nostril daily.   Multiple Vitamins-Minerals (MULTI FOR HER 50+) TABS Take 1 tablet by mouth daily.   Omega-3 Fatty Acids (FISH OIL) 1000 MG CAPS Take 1,000 mg by mouth daily.   omeprazole (PRILOSEC) 20 MG capsule Take 1 capsule (20 mg total) by mouth in the morning 30 minutes before morning meal   Probiotic Product (PROBIOTIC PO) Take 1 capsule by mouth daily. Called ultra spectrum     Review of Systems      All other systems reviewed and are otherwise negative except as noted  above.  Physical Exam    VS:  BP 118/78   Pulse (!) 55   Ht 5\' 5"  (1.651 m)   Wt 151 lb (68.5 kg)   LMP 10/02/2006 (  Approximate)   BMI 25.13 kg/m  , BMI Body mass index is 25.13 kg/m.  Wt Readings from Last 3 Encounters:  09/01/21 151 lb (68.5 kg)  08/22/21 140 lb (63.5 kg)  08/02/21 150 lb 8 oz (68.3 kg)    GEN: Well nourished, well developed, in no acute distress. HEENT: normal. Neck: Supple, no JVD, carotid bruits, or masses. Cardiac: RRR, no murmurs, rubs, or gallops. No clubbing, cyanosis, edema.  Radials/PT 2+ and equal bilaterally.  Respiratory:  Respirations regular and unlabored, clear to auscultation bilaterally. GI: Soft, nontender, nondistended. MS: No deformity or atrophy. Skin: Warm and dry, no rash. Neuro:  Strength and sensation are intact. Psych: Normal affect.  Assessment & Plan    PAF / Palpitations - EKG today SB 55 bpm. Episodes with ED visit 08/27/21 with palpitations while driving home and near syncope. Concern for rapid atrial fib vs SVT. ED workup with troponinx2 negative, BMP/CBC unremarkable, EKG with NSR. Plan for thyroid panel to rule out hyperthyroidism as contributory. Plan for 14 day ZIO monitor to be mailed to her home to rule out significant arrhythmia. Not on anticoagulation per EP guidelines that females with CHADS2VASc of 2 do not require anticoagulation.   HLD - 10/2020 LDL 109. Follows with primary care. 10 year ASCVD risk score 4.5%. No indication for statin at this time. Recommend lipid lowering diet and regular cardiovascular exercise.   Disposition: Follow up  in April as scheduled  with Kara Chandler, MD   Signed, Loel Dubonnet, NP 09/01/2021, 9:51 AM [place]

## 2021-09-01 ENCOUNTER — Encounter (HOSPITAL_BASED_OUTPATIENT_CLINIC_OR_DEPARTMENT_OTHER): Payer: Self-pay | Admitting: Family

## 2021-09-01 ENCOUNTER — Other Ambulatory Visit: Payer: Self-pay

## 2021-09-01 ENCOUNTER — Ambulatory Visit (HOSPITAL_BASED_OUTPATIENT_CLINIC_OR_DEPARTMENT_OTHER): Payer: 59 | Admitting: Family

## 2021-09-01 VITALS — BP 118/78 | HR 55 | Ht 65.0 in | Wt 151.0 lb

## 2021-09-01 DIAGNOSIS — R002 Palpitations: Secondary | ICD-10-CM

## 2021-09-01 DIAGNOSIS — E782 Mixed hyperlipidemia: Secondary | ICD-10-CM | POA: Diagnosis not present

## 2021-09-01 DIAGNOSIS — I480 Paroxysmal atrial fibrillation: Secondary | ICD-10-CM | POA: Diagnosis not present

## 2021-09-01 NOTE — Patient Instructions (Signed)
Medication Instructions:  Your Physician recommend you continue on your current medication as directed.    *If you need a refill on your cardiac medications before your next appointment, please call your pharmacy*   Lab Work: Your physician recommends that you return for lab work today- thyroid Panel (3rd floor)  If you have labs (blood work) drawn today and your tests are completely normal, you will receive your results only by: MyChart Message (if you have MyChart) OR A paper copy in the mail If you have any lab test that is abnormal or we need to change your treatment, we will call you to review the results.   Testing/Procedures: Your physician has recommended that you wear a Zio monitor. This will be mailed to you. Please complete when you have returned from your trip!   This monitor is a medical device that records the heart's electrical activity. Doctors most often use these monitors to diagnose arrhythmias. Arrhythmias are problems with the speed or rhythm of the heartbeat. The monitor is a small device applied to your chest. You can wear one while you do your normal daily activities. While wearing this monitor if you have any symptoms to push the button and record what you felt. Once you have worn this monitor for the period of time provider prescribed (Usually 14 days), you will return the monitor device in the postage paid box. Once it is returned they will download the data collected and provide [place] with a report which the provider will then review and we will call you with those results. Important tips:  Avoid showering during the first 24 hours of wearing the monitor. Avoid excessive sweating to help maximize wear time. Do not submerge the device, no hot tubs, and no swimming pools. Keep any lotions or oils away from the patch. After 24 hours you may shower with the patch on. Take brief showers with your back facing the shower head.  Do not remove patch once it has been placed  because that will interrupt data and decrease adhesive wear time. Push the button when you have any symptoms and write down what you were feeling. Once you have completed wearing your monitor, remove and place into box which has postage paid and place in your outgoing mailbox.  If for some reason you have misplaced your box then call our office and we can provide another box and/or mail it off for you.      Follow-Up: At [facility], you and your health needs are our priority.  As part of our continuing mission to provide you with exceptional heart care, we have created designated Provider Care Teams.  These Care Teams include your primary Cardiologist (physician) and Advanced Practice Providers (APPs -  Physician Assistants and Nurse Practitioners) who all work together to provide you with the care you need, when you need it.  We recommend signing up for the patient portal called "MyChart".  Sign up information is provided on this After Visit Summary.  MyChart is used to connect with patients for Virtual Visits (Telemedicine).  Patients are able to view lab/test results, encounter notes, upcoming appointments, etc.  Non-urgent messages can be sent to your provider as well.   To learn more about what you can do with MyChart, go to [email or web address].    Your next appointment:   Please Keep your scheduled appointment with Dr. Angelena Form on 4/3 at 10:00am   The format for your next appointment:   In Person  Provider:  Lauree Chandler, MD     Other Instructions To prevent palpitations: Make sure you are adequately hydrated.  Avoid and/or limit caffeine containing beverages like soda or tea. Exercise regularly.  Manage stress well. Some over the counter medications can cause palpitations such as Benadryl, AdvilPM, TylenolPM. Regular Advil or Tylenol do not cause palpitations.    Supraventricular Tachycardia, Adult Supraventricular tachycardia (SVT) is a kind of abnormal  heartbeat. It makes your heart beat very fast. This may last for a short time and then return to normal, or it may last longer. A normal resting heartbeat is 60-100 times a minute. This condition can make your heart beat more than 150 times a minute. Times of having a fast heartbeat (episodes) can be scary, but they are usually not dangerous. In some cases, they may lead to heart failure if they: Happen many times a day. Last longer than a few seconds. What are the causes? This condition happens when electrical signals are sent out from areas of the heart that do not normally send signals for the heartbeat. What increases the risk? You are more likely to develop this condition if you are: Middle aged or younger. Female. The following factors may also make you more likely to develop this condition: Stress. Feeling worried or nervous (anxiety). Tiredness. Smoking. Stimulant drugs, such as cocaine and methamphetamine. Alcohol. Caffeine. Pregnancy. Having certain medical conditions. What are the signs or symptoms? A pounding heart. A feeling that your heart is skipping beats (palpitations). Weakness. Trouble getting enough air. Pain or tightness in your chest. Dizziness or feeling like you are going to pass out (faint). Feeling worried or nervous. Sweating. Feeling like you may vomit (nausea). Passing out. Tiredness. Sometimes, there are no symptoms. How is this treated? Treatment may include: Vagal nerve stimulation. Ways to do this include: Holding your breath and pushing, as though you are pooping (having a bowel movement). Massaging an area on one side of your neck. Do not try this yourself. Only a doctor should do this. If done the wrong way, it can lead to a stroke. Bending forward with your head between your legs. Coughing while bending forward with your head between your legs. Putting an ice-cold, wet towel on your face. Medicines that prevent attacks. Medicine to stop  an attack given through an IV tube at the hospital. A small electric shock (cardioversion) that stops an attack. A procedure to get rid of cells in the area that is causing the fast heartbeats (radiofrequency ablation). If you do not have symptoms, you may not need treatment. Follow these instructions at home: Stress Avoid things that make you feel stressed. To deal with stress, try: Doing yoga or meditation. Being out in nature. Listening to relaxing music. Doing deep breathing. Taking steps to be healthy, such as getting lots of sleep, exercising, and eating a balanced diet. Talking with a mental health doctor. Lifestyle  Try to get at least 7 hours of sleep each night. Do not smoke or use any products that contain nicotine or tobacco. If you need help quitting, ask your doctor. Do not drink alcohol if it gives you a fast heartbeat. If alcohol does not seem to give you a fast heartbeat, limit your alcohol use. If you drink alcohol: Limit how much you have to: 0-1 drink a day for women who are not pregnant. 0-2 drinks a day for men. Know how much alcohol is in your drink. In the [place] one drink equals one 12 oz bottle of beer (  355 mL), one 5 oz glass of wine (148 mL), or one 1 oz glass of hard liquor (44 mL). Be aware of how caffeine affects you. If caffeine gives you a fast heartbeat, do not eat, drink, or use anything with caffeine in it. If caffeine does not seem to give you a fast heartbeat, limit how much caffeine you eat, drink, or use. Do not use stimulant drugs. If you need help quitting, ask your doctor. General instructions Stay at a healthy weight. Exercise regularly. Ask your doctor about good activities for you. Try one or a mixture of these: 150 minutes a week of gentle exercise, like walking or yoga. 75 minutes a week of exercise that is very active, like running or swimming. Do vagus nerve treatments to slow down your heartbeat as told by your doctor. Take  over-the-counter and prescription medicines only as told by your doctor. Keep all follow-up visits. Contact a doctor if: You have a fast heartbeat more often. Times of having a fast heartbeat last longer than before. Home treatments to slow down your heartbeat do not help. You have new symptoms. Get help right away if: You have chest pain. Your symptoms get worse. You have trouble breathing. Your heart beats very fast for more than 20 minutes. You pass out. These symptoms may be an emergency. Get medical help right away. Call your local emergency services (911 in the [place] Do not wait to see if the symptoms will go away. Do not drive yourself to the hospital. Summary SVT is a type of abnormal heartbeat. This condition can make your heart beat more than 150 times a minute. If you do not have symptoms, you may not need treatment. This information is not intended to replace advice given to you by your health care provider. Make sure you discuss any questions you have with your health care provider. Document Revised: 05/01/2020 Document Reviewed: 05/01/2020 Elsevier Patient Education  [place] [employer].

## 2021-09-02 ENCOUNTER — Encounter (HOSPITAL_BASED_OUTPATIENT_CLINIC_OR_DEPARTMENT_OTHER): Payer: Self-pay

## 2021-09-02 LAB — THYROID PANEL WITH TSH
Free Thyroxine Index: 1.5 (ref 1.2–4.9)
T3 Uptake Ratio: 30 % (ref 24–39)
T4, Total: 5.1 ug/dL (ref 4.5–12.0)
TSH: 0.711 u[IU]/mL (ref 0.450–4.500)

## 2021-09-02 NOTE — Progress Notes (Signed)
Results shared via mychart

## 2021-09-05 ENCOUNTER — Encounter (HOSPITAL_BASED_OUTPATIENT_CLINIC_OR_DEPARTMENT_OTHER): Payer: Self-pay

## 2021-09-05 NOTE — Progress Notes (Signed)
Last read by Wilma Flavin at 8:57 AM on 09/03/2021.

## 2021-09-10 ENCOUNTER — Encounter (HOSPITAL_BASED_OUTPATIENT_CLINIC_OR_DEPARTMENT_OTHER): Payer: Self-pay

## 2021-09-12 ENCOUNTER — Other Ambulatory Visit (HOSPITAL_COMMUNITY): Payer: Self-pay

## 2021-09-12 ENCOUNTER — Other Ambulatory Visit: Payer: Self-pay | Admitting: Nurse Practitioner

## 2021-09-12 ENCOUNTER — Ambulatory Visit: Payer: 59

## 2021-09-12 DIAGNOSIS — U071 COVID-19: Secondary | ICD-10-CM

## 2021-09-12 MED ORDER — NIRMATRELVIR/RITONAVIR (PAXLOVID)TABLET
3.0000 | ORAL_TABLET | Freq: Two times a day (BID) | ORAL | 0 refills | Status: AC
Start: 2021-09-12 — End: 2021-09-17
  Filled 2021-09-12: qty 30, 5d supply, fill #0

## 2021-09-12 MED ORDER — BENZONATATE 100 MG PO CAPS
100.0000 mg | ORAL_CAPSULE | Freq: Three times a day (TID) | ORAL | 0 refills | Status: AC | PRN
  Filled 2021-09-12: qty 30, 10d supply, fill #0

## 2021-09-12 MED ORDER — ALBUTEROL SULFATE HFA 108 (90 BASE) MCG/ACT IN AERS
2.0000 | INHALATION_SPRAY | Freq: Four times a day (QID) | RESPIRATORY_TRACT | 0 refills | Status: DC | PRN
  Filled 2021-09-12: qty 18, 25d supply, fill #0

## 2021-09-12 NOTE — Progress Notes (Unsigned)
Enrolled for [employer] to mail a ZIO XT long term holter monitor to the patients address on file.  Requested mailing of monitor to be expedited, (monitor ordered 09/01/21 but unfortunately linked to patients office visit appointment the same day).

## 2021-09-12 NOTE — Progress Notes (Signed)
[facility] visit prescriptions to [facility] - unable to e prescribe via [facility]

## 2021-09-14 ENCOUNTER — Other Ambulatory Visit (HOSPITAL_COMMUNITY): Payer: Self-pay

## 2021-09-20 ENCOUNTER — Other Ambulatory Visit (HOSPITAL_COMMUNITY): Payer: Self-pay

## 2021-09-20 MED ORDER — CARESTART COVID-19 HOME TEST VI KIT
PACK | 0 refills | Status: DC
  Filled 2021-09-20: qty 4, 4d supply, fill #0

## 2021-09-23 ENCOUNTER — Ambulatory Visit: Payer: 59 | Admitting: Plastic Surgery

## 2021-09-23 ENCOUNTER — Other Ambulatory Visit: Payer: Self-pay

## 2021-09-23 DIAGNOSIS — D6869 Other thrombophilia: Secondary | ICD-10-CM | POA: Diagnosis not present

## 2021-09-23 DIAGNOSIS — H02831 Dermatochalasis of right upper eyelid: Secondary | ICD-10-CM | POA: Diagnosis not present

## 2021-09-23 DIAGNOSIS — H02834 Dermatochalasis of left upper eyelid: Secondary | ICD-10-CM

## 2021-09-26 DIAGNOSIS — D6869 Other thrombophilia: Secondary | ICD-10-CM | POA: Insufficient documentation

## 2021-09-26 NOTE — Progress Notes (Signed)
Referring Provider Isaac Bliss, Rayford Halsted, MD [address] [place],  [place] [address]   CC:  Upper eyelid visual disturbance   Kara Warren is an 65 y.o. female.  HPI: 65 year old who notes difficulty with peripheral vision.  She attributes this to her upper eyelids.  Her ophthalmologist is Dr. Phillip Heal Lyles/she is not really interested in lower eyelid surgery.  She has not had a visual field test.  She is a non-smoker.  No blood thinners.  No history of diabetes.  She has a history of A. fib and is followed by cardiology as part of the [facility].  Allergies  Allergen Reactions   Augmentin [Amoxicillin-Pot Clavulanate] Diarrhea    Outpatient Encounter Medications as of 09/23/2021  Medication Sig   Ascorbic Acid (VITAMIN C) 1000 MG tablet Take 500 mg by mouth 3 (three) times a week.   aspirin EC 81 MG tablet Take 81 mg by mouth daily. Swallow whole.   citalopram (CELEXA) 20 MG tablet Take 1.5 tablets (30 mg total) by mouth daily.   COVID-19 At Home Antigen Test [facility] COVID-19 HOME TEST) KIT use as directed   COVID-19 mRNA bivalent vaccine, Pfizer, (PFIZER COVID-19 VAC BIVALENT) injection Inject into the muscle.   diltiazem (CARDIZEM) 30 MG tablet TAKE 1 TABLET BY MOUTH 2 TIMES A DAY IF NEEDED FOR AFIB   metoprolol succinate (TOPROL-XL) 25 MG 24 hr tablet Take 1 tablet (25 mg total) by mouth daily.   Multiple Vitamins-Minerals (MULTI FOR HER 50+) TABS Take 1 tablet by mouth daily.   Omega-3 Fatty Acids (FISH OIL) 1000 MG CAPS Take 1,000 mg by mouth daily.   omeprazole (PRILOSEC) 20 MG capsule Take 1 capsule (20 mg total) by mouth in the morning 30 minutes before morning meal   omeprazole (PRILOSEC) 40 MG capsule    Probiotic Product (PROBIOTIC PO) Take 1 capsule by mouth daily. Called ultra spectrum   sucralfate (CARAFATE) 1 g tablet Take 1 tablet (1 g total) by mouth 4 (four) times daily.(between meals & at bedtime)   traMADol (ULTRAM) 50 MG tablet     albuterol (VENTOLIN HFA) 108 (90 Base) MCG/ACT inhaler Inhale 2 puffs into the lungs every 6 (six) hours as needed for wheezing or shortness of breath. (Patient not taking: Reported on 09/23/2021)   azelastine (ASTELIN) 0.1 % nasal spray Place 1 spray into both nostrils daily as needed for allergies. (Patient not taking: Reported on 09/23/2021)   azelastine (ASTELIN) 0.1 % nasal spray Place 1-2 sprays into both nostrils 2 (two) times daily.   azelastine (OPTIVAR) 0.05 % ophthalmic solution Place 1 drop into affected eye(s) 2 (two) times daily. (Patient not taking: Reported on 09/01/2021)   famotidine (PEPCID) 10 MG tablet Take 1 tablet (10 mg total) by mouth 2 (two) times daily. (Patient not taking: Reported on 09/23/2021)   fluticasone (FLONASE) 50 MCG/ACT nasal spray Place 1-2 sprays into both nostrils daily. (Patient not taking: Reported on 09/01/2021)   mometasone (NASONEX) 50 MCG/ACT nasal spray Place 1-2 sprays into each nostril daily.   montelukast (SINGULAIR) 10 MG tablet Take 1 tablet (10 mg total) by mouth daily.   No facility-administered encounter medications on file as of 09/23/2021.     Past Medical History:  Diagnosis Date   Abdominal pain, left upper quadrant    Acute bronchospasm    Acute neck pain 07/02/2019   Acute sinusitis, unspecified 05/20/2009   Centricity Description: SINUSITIS - ACUTE-NOS Qualifier: Diagnosis of  By: Birdie Riddle MD,  Belenda Cruise   Centricity Description: ACUTE SINUSITIS, UNSPECIFIED Qualifier: Diagnosis of  By: Alveta Heimlich MD, Cornelia Copa   Centricity Description: SINUSITIS, ACUTE Qualifier: Diagnosis of  By: Koleen Nimrod MD, Dellis Filbert     Atypical chest pain 03/05/2013   Body mass index (BMI) of 23.0-23.9 in adult    Cervicalgia    Colon polyp    Costochondral junction syndrome    Cough    DEPRESSION 08/25/2008   Qualifier: Diagnosis of  By: Ronnald Ramp CMA, Chemira     Diarrhea    Diverticulosis 07/27/2012   Dorsalgia    Elevated blood pressure reading without diagnosis of  hypertension    Endometrial polyp 8/08   benign   Essential hypertension 09/20/2020   Family history of osteoporosis 12/28/2011   Mother pt had normal Dexa 11/2009    Fatigue    GERD (gastroesophageal reflux disease)    HIP PAIN, RIGHT 04/12/2009   Qualifier: Diagnosis of  By: Oneida Alar MD, KARL     History of hiatal hernia    HYPERLIPIDEMIA 08/25/2008   Qualifier: Diagnosis of  By: Birdie Riddle MD, Belenda Cruise     IBS (irritable bowel syndrome)    Left hip pain 07/30/2013   Left wrist pain 07/30/2013   Lumbar strain, initial encounter 07/28/2019   LUNG NODULE 11/19/2009   Neg CT     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 Please review for accuracy.   Otalgia, unspecified ear    Pain in throat    Palpitations    Paroxysmal atrial fibrillation (HCC)    Peroneal tendinitis of right lower extremity 03/12/2014   Plantar wart 04/02/2014   Pneumonia    RHINITIS 06/21/2009   Qualifier: Diagnosis of  By: Birdie Riddle MD, Belenda Cruise     Right ankle injury, subsequent encounter 01/11/2018   Right foot pain 08/09/2011   Sinusitis, chronic    Skene's gland abscess 2009   patient unaware   Sprain of unspecified ligament of right ankle, subsequent encounter    TRANSAMINASES, SERUM, ELEVATED 12/07/2009   Qualifier: Diagnosis of  By: Inda Castle FNP, Melissa S    Unspecified cataract    Urinary tract infection, site not specified    Vitamin D deficiency     Past Surgical History:  Procedure Laterality Date   COLONOSCOPY     DILATION AND CURETTAGE OF UTERUS  8/08   GYNECOLOGIC  CRYOSURGERY  1980   HYSTEROSCOPY WITH D & C N/A 03/30/2017   Procedure: DILATATION & CURETTAGE/HYSTEROSCOPY WITH ULTRASOUND GUIDANCE;  Surgeon: Megan Salon, MD;  Location: [facility];  Service: Gynecology;  Laterality: N/A;   UPPER GI ENDOSCOPY     uterine ablation  2007    Family History  Problem Relation Age of Onset   Colon polyps Father    Ulcerative colitis Father    CAD Father        Stent placement at age 67   Glaucoma Father    Hypertension Father    Colitis Father    Breast cancer Paternal Grandmother    Colon cancer Maternal Grandfather  Colon polyps Mother    Osteoporosis Mother    Thyroid disease Mother        hypo   Glaucoma Mother    Diverticulosis Mother    Sudden death Neg Hx    Hyperlipidemia Neg Hx    Heart attack Neg Hx    Diabetes Neg Hx     Social History   Social History Narrative   Not on file     Review of Systems General: Denies fevers, chills, weight loss CV: Denies chest pain, shortness of breath, palpitations   Physical Exam Vitals with BMI 09/01/2021 08/22/2021 08/02/2021  Height $Remov'5\' 5"'zQUvBq$  $Remove'5\' 5"'LYsOxPl$  -  Weight 151 lbs 140 lbs 150 lbs 8 oz  BMI 32.34 68.8 -  Systolic 737 308 168  Diastolic 78 78 70  Pulse 55 - 60    General:  No acute distress,  Alert and oriented, Non-Toxic, Normal speech and affect HEENT: Brow above the orbital rim, significant dermatochalasis with eyelid skin resting on the lashes.  MRD equals 3  Assessment/Plan Patient has significant excess upper eyelid skin.  I think she is a good candidate for an upper blepharoplasty.  She has not had a visual field test.  I would like to get a taped and untaped visual field test.  We will also need to make sure she is cleared from a cardiology standpoint at the time of surgery due to her A. fib.  Lennice Sites 09/26/2021, 11:25 AM

## 2021-09-29 ENCOUNTER — Other Ambulatory Visit (HOSPITAL_COMMUNITY): Payer: Self-pay

## 2021-09-30 ENCOUNTER — Institutional Professional Consult (permissible substitution): Payer: 59 | Admitting: Plastic Surgery

## 2021-10-04 ENCOUNTER — Encounter (HOSPITAL_BASED_OUTPATIENT_CLINIC_OR_DEPARTMENT_OTHER): Payer: Self-pay

## 2021-10-10 ENCOUNTER — Ambulatory Visit (INDEPENDENT_AMBULATORY_CARE_PROVIDER_SITE_OTHER): Payer: Medicare HMO | Admitting: Internal Medicine

## 2021-10-10 ENCOUNTER — Encounter: Payer: Self-pay | Admitting: Internal Medicine

## 2021-10-10 ENCOUNTER — Other Ambulatory Visit (HOSPITAL_BASED_OUTPATIENT_CLINIC_OR_DEPARTMENT_OTHER): Payer: Self-pay

## 2021-10-10 VITALS — BP 120/78 | HR 55 | Temp 98.5°F | Ht 64.0 in | Wt 146.6 lb

## 2021-10-10 DIAGNOSIS — Z13820 Encounter for screening for osteoporosis: Secondary | ICD-10-CM

## 2021-10-10 DIAGNOSIS — D6869 Other thrombophilia: Secondary | ICD-10-CM | POA: Diagnosis not present

## 2021-10-10 DIAGNOSIS — K635 Polyp of colon: Secondary | ICD-10-CM | POA: Diagnosis not present

## 2021-10-10 DIAGNOSIS — E782 Mixed hyperlipidemia: Secondary | ICD-10-CM | POA: Diagnosis not present

## 2021-10-10 DIAGNOSIS — I10 Essential (primary) hypertension: Secondary | ICD-10-CM | POA: Diagnosis not present

## 2021-10-10 DIAGNOSIS — F330 Major depressive disorder, recurrent, mild: Secondary | ICD-10-CM

## 2021-10-10 DIAGNOSIS — I480 Paroxysmal atrial fibrillation: Secondary | ICD-10-CM | POA: Diagnosis not present

## 2021-10-10 DIAGNOSIS — Z0000 Encounter for general adult medical examination without abnormal findings: Secondary | ICD-10-CM | POA: Diagnosis not present

## 2021-10-10 DIAGNOSIS — R69 Illness, unspecified: Secondary | ICD-10-CM | POA: Diagnosis not present

## 2021-10-10 LAB — COMPREHENSIVE METABOLIC PANEL
ALT: 23 U/L (ref 0–35)
AST: 27 U/L (ref 0–37)
Albumin: 4.8 g/dL (ref 3.5–5.2)
Alkaline Phosphatase: 57 U/L (ref 39–117)
BUN: 15 mg/dL (ref 6–23)
CO2: 28 mEq/L (ref 19–32)
Calcium: 9.8 mg/dL (ref 8.4–10.5)
Chloride: 100 mEq/L (ref 96–112)
Creatinine, Ser: 0.73 mg/dL (ref 0.40–1.20)
GFR: 85.67 mL/min (ref >60.00)
Glucose, Bld: 88 mg/dL (ref 70–99)
Potassium: 4.2 mEq/L (ref 3.5–5.1)
Sodium: 136 mEq/L (ref 135–145)
Total Bilirubin: 0.8 mg/dL (ref 0.2–1.2)
Total Protein: 7.6 g/dL (ref 6.0–8.3)

## 2021-10-10 LAB — CBC WITH DIFFERENTIAL/PLATELET
Basophils Absolute: 0 10*3/uL (ref 0.0–0.1)
Basophils Relative: 0.9 % (ref 0.0–3.0)
Eosinophils Absolute: 0.1 10*3/uL (ref 0.0–0.7)
Eosinophils Relative: 1.5 % (ref 0.0–5.0)
HCT: 41.4 % (ref 36.0–46.0)
Hemoglobin: 13.8 g/dL (ref 12.0–15.0)
Lymphocytes Relative: 47 % — ABNORMAL HIGH (ref 12.0–46.0)
Lymphs Abs: 1.8 10*3/uL (ref 0.7–4.0)
MCHC: 33.4 g/dL (ref 30.0–36.0)
MCV: 97.6 fl (ref 78.0–100.0)
Monocytes Absolute: 0.4 10*3/uL (ref 0.1–1.0)
Monocytes Relative: 9.7 % (ref 3.0–12.0)
Neutro Abs: 1.5 10*3/uL (ref 1.4–7.7)
Neutrophils Relative %: 40.9 % — ABNORMAL LOW (ref 43.0–77.0)
Platelets: 281 10*3/uL (ref 150.0–400.0)
RBC: 4.24 Mil/uL (ref 3.87–5.11)
RDW: 13.7 % (ref 11.5–15.5)
WBC: 3.8 10*3/uL — ABNORMAL LOW (ref 4.0–10.5)

## 2021-10-10 LAB — LIPID PANEL
Cholesterol: 210 mg/dL — ABNORMAL HIGH (ref 0–200)
HDL: 87.7 mg/dL (ref >39.00)
LDL Cholesterol: 109 mg/dL — ABNORMAL HIGH (ref 0–99)
NonHDL: 122.05
Total CHOL/HDL Ratio: 2
Triglycerides: 64 mg/dL (ref 0.0–149.0)
VLDL: 12.8 mg/dL (ref 0.0–40.0)

## 2021-10-10 LAB — TSH: TSH: 0.69 u[IU]/mL (ref 0.35–5.50)

## 2021-10-10 NOTE — Progress Notes (Signed)
Established Patient Office Visit     This visit occurred during the SARS-CoV-2 public health emergency.  Safety protocols were in place, including screening questions prior to the visit, additional usage of staff PPE, and extensive cleaning of exam room while observing appropriate contact time as indicated for disinfecting solutions.    CC/Reason for Visit: Subsequent Medicare wellness visit  HPI: Kara Warren is a 66 y.o. female who is coming in today for the above mentioned reasons. Past Medical History is significant for: Atrial fibrillation followed by the [facility] fib [facility], history of depression on Celexa.  She is feeling well and has no acute concerns today.  She has routine eye and dental care.  No perceived hearing issues, she exercises on a daily basis either walking, doing strength training or Pilates.  All immunizations are up-to-date and age-appropriate.  She had a colonoscopy in 2021 with Dr. Collene Mares but will be having a repeat this year.  She had a mammogram in September.  She believes she had a Pap smear within the last 2 years but we will request records from her prior PCP to determine.  She is overdue for a bone density test.   Past Medical/Surgical History: Past Medical History:  Diagnosis Date   Abdominal pain, left upper quadrant    Acute bronchospasm    Acute neck pain 07/02/2019   Acute sinusitis, unspecified 05/20/2009   Centricity Description: SINUSITIS - ACUTE-NOS Qualifier: Diagnosis of  By: Birdie Riddle MD, Belenda Cruise   Centricity Description: ACUTE SINUSITIS, UNSPECIFIED Qualifier: Diagnosis of  By: Alveta Heimlich MD, Eugene   Centricity Description: SINUSITIS, ACUTE Qualifier: Diagnosis of  By: Koleen Nimrod MD, Dellis Filbert     Atypical chest pain 03/05/2013   Body mass index (BMI) of 23.0-23.9 in adult    Cervicalgia    Colon polyp    Costochondral junction syndrome    Cough    DEPRESSION 08/25/2008   Qualifier: Diagnosis of  By: Ronnald Ramp CMA, Chemira     Diarrhea    Diverticulosis  07/27/2012   Dorsalgia    Elevated blood pressure reading without diagnosis of hypertension    Endometrial polyp 8/08   benign   Essential hypertension 09/20/2020   Family history of osteoporosis 12/28/2011   Mother pt had normal Dexa 11/2009    Fatigue    GERD (gastroesophageal reflux disease)    HIP PAIN, RIGHT 04/12/2009   Qualifier: Diagnosis of  By: Oneida Alar MD, KARL     History of hiatal hernia    HYPERLIPIDEMIA 08/25/2008   Qualifier: Diagnosis of  By: Birdie Riddle MD, Belenda Cruise     IBS (irritable bowel syndrome)    Left hip pain 07/30/2013   Left wrist pain 07/30/2013   Lumbar strain, initial encounter 07/28/2019   LUNG NODULE 11/19/2009   Neg CT     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 Please review for accuracy.   Otalgia, unspecified ear    Pain in throat    Palpitations    Paroxysmal atrial fibrillation (HCC)    Peroneal  tendinitis of right lower extremity 03/12/2014   Plantar wart 04/02/2014   Pneumonia    RHINITIS 06/21/2009   Qualifier: Diagnosis of  By: Birdie Riddle MD, Belenda Cruise     Right ankle injury, subsequent encounter 01/11/2018   Right foot pain 08/09/2011   Sinusitis, chronic    Skene's gland abscess 2009   patient unaware   Sprain of unspecified ligament of right ankle, subsequent encounter    TRANSAMINASES, SERUM, ELEVATED 12/07/2009   Qualifier: Diagnosis of  By: Inda Castle FNP, Melissa S    Unspecified cataract    Urinary tract infection, site not specified    Vitamin D deficiency     Past Surgical History:  Procedure Laterality Date    COLONOSCOPY     DILATION AND CURETTAGE OF UTERUS  8/08   GYNECOLOGIC CRYOSURGERY  1980   HYSTEROSCOPY WITH D & C N/A 03/30/2017   Procedure: DILATATION & CURETTAGE/HYSTEROSCOPY WITH ULTRASOUND GUIDANCE;  Surgeon: Megan Salon, MD;  Location: [facility];  Service: Gynecology;  Laterality: N/A;   UPPER GI ENDOSCOPY     uterine ablation  2007    Social History:  reports that she quit smoking about 43 years ago. Her smoking use included cigarettes. She has a 0.30 pack-year smoking history. She has never used smokeless tobacco. She reports current alcohol use of about 7.0 standard drinks per week. She reports that she does not use drugs.  Allergies: Allergies  Allergen Reactions   Augmentin [Amoxicillin-Pot Clavulanate] Diarrhea    Family History:  Family History  Problem Relation Age of Onset   Colon polyps Father    Ulcerative colitis Father    CAD Father        Stent placement at age 5   Glaucoma Father    Hypertension Father    Colitis Father    Breast cancer Paternal Grandmother    Colon cancer Maternal Grandfather    Colon polyps Mother    Osteoporosis Mother    Thyroid disease Mother        hypo   Glaucoma Mother    Diverticulosis Mother    Sudden death Neg Hx    Hyperlipidemia Neg Hx    Heart attack Neg Hx    Diabetes Neg Hx      Current Outpatient Medications:    albuterol (VENTOLIN HFA) 108 (90 Base) MCG/ACT inhaler, Inhale 2 puffs into the lungs every 6 (six) hours as needed for wheezing or shortness of breath., Disp: 18 g, Rfl: 0   Ascorbic Acid (VITAMIN C) 1000 MG tablet, Take 500 mg by mouth 3 (three) times a week., Disp: , Rfl:    aspirin EC 81 MG tablet, Take 81 mg by mouth daily. Swallow whole., Disp: , Rfl:    azelastine (ASTELIN) 0.1 % nasal spray, Place 1 spray into both nostrils daily as needed for allergies., Disp: , Rfl:    azelastine (OPTIVAR) 0.05 % ophthalmic solution, Place 1 drop into affected eye(s) 2 (two) times daily., Disp: 6 mL, Rfl: 3    citalopram (CELEXA) 20 MG tablet, Take 1.5 tablets (30 mg total) by mouth daily., Disp: 145 tablet, Rfl: 0   diltiazem (CARDIZEM) 30 MG tablet, TAKE 1 TABLET BY MOUTH 2 TIMES A DAY IF NEEDED FOR AFIB, Disp: 45 tablet, Rfl: 1   famotidine (PEPCID) 10 MG tablet, Take 1 tablet (10 mg total) by mouth 2 (two) times daily., Disp: 90 tablet, Rfl: 0   fluticasone (FLONASE) 50 MCG/ACT nasal spray, Place 1-2 sprays into both nostrils daily., Disp:  16 g, Rfl: 3   metoprolol succinate (TOPROL-XL) 25 MG 24 hr tablet, Take 1 tablet (25 mg total) by mouth daily., Disp: 90 tablet, Rfl: 0   Multiple Vitamins-Minerals (MULTI FOR HER 50+) TABS, Take 1 tablet by mouth daily., Disp: , Rfl:    Omega-3 Fatty Acids (FISH OIL) 1000 MG CAPS, Take 1,000 mg by mouth daily., Disp: , Rfl:    omeprazole (PRILOSEC) 20 MG capsule, Take 1 capsule (20 mg total) by mouth in the morning 30 minutes before morning meal, Disp: 90 capsule, Rfl: 0   Probiotic Product (PROBIOTIC PO), Take 1 capsule by mouth daily. Called ultra spectrum, Disp: , Rfl:    omeprazole (PRILOSEC) 40 MG capsule, , Disp: , Rfl:    sucralfate (CARAFATE) 1 g tablet, Take 1 tablet (1 g total) by mouth 4 (four) times daily.(between meals & at bedtime) (Patient not taking: Reported on 10/10/2021), Disp: 120 tablet, Rfl: 0   traMADol (ULTRAM) 50 MG tablet, , Disp: , Rfl:   Review of Systems:  Constitutional: Denies fever, chills, diaphoresis, appetite change and fatigue.  HEENT: Denies photophobia, eye pain, redness, hearing loss, ear pain, congestion, sore throat, rhinorrhea, sneezing, mouth sores, trouble swallowing, neck pain, neck stiffness and tinnitus.   Respiratory: Denies SOB, DOE, cough, chest tightness,  and wheezing.   Cardiovascular: Denies chest pain, palpitations and leg swelling.  Gastrointestinal: Denies nausea, vomiting, abdominal pain, diarrhea, constipation, blood in stool and abdominal distention.  Genitourinary: Denies dysuria, urgency, frequency,  hematuria, flank pain and difficulty urinating.  Endocrine: Denies: hot or cold intolerance, sweats, changes in hair or nails, polyuria, polydipsia. Musculoskeletal: Denies myalgias, back pain, joint swelling, arthralgias and gait problem.  Skin: Denies pallor, rash and wound.  Neurological: Denies dizziness, seizures, syncope, weakness, light-headedness, numbness and headaches.  Hematological: Denies adenopathy. Easy bruising, personal or family bleeding history  Psychiatric/Behavioral: Denies suicidal ideation, mood changes, confusion, nervousness, sleep disturbance and agitation    Physical Exam: Vitals:   10/10/21 0836  BP: 120/78  Pulse: (!) 55  Temp: 98.5 F (36.9 C)  TempSrc: Oral  SpO2: 97%  Weight: 146 lb 9.6 oz (66.5 kg)  Height: 5\' 4"  (1.626 m)    Body mass index is 25.16 kg/m.   Constitutional: NAD, calm, comfortable Eyes: PERRL, lids and conjunctivae normal, wears corrective lenses ENMT: Mucous membranes are moist. Posterior pharynx clear of any exudate or lesions. Normal dentition. Tympanic membrane is pearly white, no erythema or bulging. Neck: normal, supple, no masses, no thyromegaly Respiratory: clear to auscultation bilaterally, no wheezing, no crackles. Normal respiratory effort. No accessory muscle use.  Cardiovascular: Regular rate and irregular rhythm, no murmurs / rubs / gallops. No extremity edema. 2+ pedal pulses. No carotid bruits.  Abdomen: no tenderness, no masses palpated. No hepatosplenomegaly. Bowel sounds positive.  Musculoskeletal: no clubbing / cyanosis. No joint deformity upper and lower extremities. Good ROM, no contractures. Normal muscle tone.  Skin: no rashes, lesions, ulcers. No induration Neurologic: CN 2-12 grossly intact. Sensation intact, DTR normal. Strength 5/5 in all 4.  Psychiatric: Normal judgment and insight. Alert and oriented x 3. Normal mood.    Subsequent Medicare wellness visit   1. Risk factors, based on past  M,S,F  -cardiovascular disease risk factors include age.   2.  Physical activities: She is very physically active   3.  Depression/mood: History of depression but mood is stable   4.  Hearing: No perceived issues   5.  ADL's: Independent in all ADLs   6.  Fall risk: Low fall risk   7.  Home safety: No problems identified   8.  Height weight, and visual acuity: height and weight as above, vision:  Vision Screening   Right eye Left eye Both eyes  Without correction     With correction 20/25 20/40 20/30      9.  Counseling: We discussed updating DEXA scan   10. Lab orders based on risk factors: Laboratory update will be reviewed   11. Referral : DEXA scan   12. Care plan: Follow-up with me in 6 months   13. Cognitive assessment: No cognitive impairment   14. Screening: Patient provided with a written and personalized 5-10 year screening schedule in the AVS. yes   15. Provider List Update: PCP only  16. Advance Directives: Full code   17. Opioids: Patient is not on any opioid prescriptions and has no risk factors for a substance use disorder.   [place] Office Visit from 10/10/2021 in [facility]  PHQ-9 Total Score 0       Fall Risk 09/21/2020 01/12/2021 03/09/2021 06/30/2021 10/10/2021  Falls in the past year? - - - 0 0  Was there an injury with Fall? - - - 0 0  Fall Risk Category Calculator - - - 0 0  Fall Risk Category - - - Low Low  Patient Fall Risk Level Low fall risk Low fall risk Low fall risk - Low fall risk  Patient at Risk for Falls Due to - - - - No Fall Risks  Fall risk Follow up - - - - Falls evaluation completed     Impression and Plan:  Medicare annual wellness visit, subsequent -Recommend routine eye and dental care. -Immunizations: All immunizations are up-to-date and age-appropriate -Healthy lifestyle discussed in detail. -Labs to be updated today. -Colon cancer screening: March/2021, due for redo this year -Breast cancer  screening: 06/2021 -Cervical cancer screening: She believes was done in 2020 or 2021, will obtain records from PCP -Lung cancer screening: Not applicable -Prostate cancer screening: Not applicable -DEXA: Requested  AF (paroxysmal atrial fibrillation) ([place])  - Plan: TSH, TSH -She is rate controlled, not anticoagulated.  Essential hypertension  - Plan: CBC with Differential/Platelet, Comprehensive metabolic panel, Comprehensive metabolic panel, CBC with Differential/Platelet -Well-controlled.  Polyp of colon, unspecified part of colon, unspecified type -Scheduled for 2-year follow-up colonoscopy in March.  Mixed hyperlipidemia  - Plan: Lipid panel, Lipid panel  Mild episode of recurrent major depressive disorder (HCC) -Mood is stable on Celexa. -PHQ-9 score of 0.  Encounter for screening for osteoporosis  - Plan: DG Bone Density    Patient Instructions  -Nice seeing you today!!  -Lab work today; will notify you once results are available.  -All immunizations are up to date.  -Schedule follow up in 6 months or sooner as needed.      Lelon Frohlich, MD [facility]

## 2021-10-10 NOTE — Progress Notes (Signed)
Medical release for records successfully faxed to Dr Ernie Hew office.

## 2021-10-10 NOTE — Patient Instructions (Signed)
-  Nice seeing you today!!  -Lab work today; will notify you once results are available.  -All immunizations are up to date.  -Schedule follow up in 6 months or sooner as needed.

## 2021-10-11 ENCOUNTER — Encounter: Payer: Self-pay | Admitting: Internal Medicine

## 2021-10-13 ENCOUNTER — Ambulatory Visit
Admission: RE | Admit: 2021-10-13 | Discharge: 2021-10-13 | Disposition: A | Discharge status: Home / Self Care | Payer: Medicare HMO | Source: Ambulatory Visit | Attending: Internal Medicine | Admitting: Internal Medicine

## 2021-10-13 DIAGNOSIS — M8589 Other specified disorders of bone density and structure, multiple sites: Secondary | ICD-10-CM | POA: Diagnosis not present

## 2021-10-13 DIAGNOSIS — Z13820 Encounter for screening for osteoporosis: Secondary | ICD-10-CM

## 2021-10-13 DIAGNOSIS — Z780 Asymptomatic menopausal state: Secondary | ICD-10-CM | POA: Diagnosis not present

## 2021-10-21 DIAGNOSIS — H02834 Dermatochalasis of left upper eyelid: Secondary | ICD-10-CM | POA: Diagnosis not present

## 2021-10-21 DIAGNOSIS — H02831 Dermatochalasis of right upper eyelid: Secondary | ICD-10-CM | POA: Diagnosis not present

## 2021-10-22 ENCOUNTER — Encounter: Payer: Self-pay | Admitting: Internal Medicine

## 2021-10-25 ENCOUNTER — Telehealth: Payer: Self-pay

## 2021-10-25 NOTE — Telephone Encounter (Signed)
Returned patients call. LMVM to have results faxed (provided fax#) to our office to Stephanie's attention. We will then forward on to the insurance company. This process can take up to 2 weeks. Colletta Maryland will call her back once she hears from [employer]. Call our office if there are any further questions.

## 2021-10-25 NOTE — Telephone Encounter (Signed)
Patient called to say she completed her visual field exam last Friday and would like to know her next steps.  Please call.

## 2021-10-26 NOTE — Telephone Encounter (Signed)
Once we have the field test in hand can somebody upload a copy in media on epic or just put a copy on my desk to review.  Thanks, [employer]

## 2021-11-02 ENCOUNTER — Encounter (HOSPITAL_BASED_OUTPATIENT_CLINIC_OR_DEPARTMENT_OTHER): Payer: Self-pay

## 2021-11-09 ENCOUNTER — Ambulatory Visit (INDEPENDENT_AMBULATORY_CARE_PROVIDER_SITE_OTHER): Payer: Medicare HMO | Admitting: Family Medicine

## 2021-11-09 ENCOUNTER — Encounter: Payer: Self-pay | Admitting: Family Medicine

## 2021-11-09 VITALS — BP 110/82 | Ht 64.0 in | Wt 140.0 lb

## 2021-11-09 DIAGNOSIS — M542 Cervicalgia: Secondary | ICD-10-CM

## 2021-11-09 NOTE — Patient Instructions (Signed)
Your pain is due to a combination of underlying arthritis and muscle strain (trapezius, posture muscles). Tylenol, ibuprofen only if needed. Heat 15 minutes at a time as needed for pain/spasms. Start physical therapy. Do home exercises/stretches on days you don't go to therapy. Follow up with me in 5-6 weeks.

## 2021-11-09 NOTE — Progress Notes (Signed)
PCP: Isaac Bliss, Rayford Halsted, MD  Subjective:   HPI: Patient is a 66 y.o. female here for neck, mid-upper back pain.  Patient denies known injury or trauma. She's had pain in neck and back before but seems to be persistent. She recently retired a couple weeks ago but no acute increase in activity level. No radiation into extremities. No bowel/bladder incontinence. Has a massage scheduled and upcoming. Neck pain at times causes her to have headaches. Tried heat. Feels like neck tightens throughout the day.  Past Medical History:  Diagnosis Date   Abdominal pain, left upper quadrant    Acute bronchospasm    Acute neck pain 07/02/2019   Acute sinusitis, unspecified 05/20/2009   Centricity Description: SINUSITIS - ACUTE-NOS Qualifier: Diagnosis of  By: Birdie Riddle MD, Belenda Cruise   Centricity Description: ACUTE SINUSITIS, UNSPECIFIED Qualifier: Diagnosis of  By: Alveta Heimlich MD, Cornelia Copa   Centricity Description: SINUSITIS, ACUTE Qualifier: Diagnosis of  By: Koleen Nimrod MD, Dellis Filbert     Atypical chest pain 03/05/2013   Body mass index (BMI) of 23.0-23.9 in adult    Cervicalgia    Colon polyp    Costochondral junction syndrome    Cough    DEPRESSION 08/25/2008   Qualifier: Diagnosis of  By: Ronnald Ramp CMA, Chemira     Diarrhea    Diverticulosis 07/27/2012   Dorsalgia    Elevated blood pressure reading without diagnosis of hypertension    Endometrial polyp 8/08   benign   Essential hypertension 09/20/2020   Family history of osteoporosis 12/28/2011   Mother pt had normal Dexa 11/2009    Fatigue    GERD (gastroesophageal reflux disease)    HIP PAIN, RIGHT 04/12/2009   Qualifier: Diagnosis of  By: Oneida Alar MD, KARL     History of hiatal hernia    HYPERLIPIDEMIA 08/25/2008   Qualifier: Diagnosis of  By: Birdie Riddle MD, Belenda Cruise     IBS (irritable bowel syndrome)    Left hip pain 07/30/2013   Left wrist pain 07/30/2013   Lumbar strain, initial encounter 07/28/2019   LUNG NODULE 11/19/2009   Neg CT      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 Please review for accuracy.   Otalgia, unspecified ear    Pain in throat    Palpitations    Paroxysmal atrial fibrillation (HCC)    Peroneal tendinitis of right lower extremity 03/12/2014   Plantar wart 04/02/2014   Pneumonia    RHINITIS 06/21/2009   Qualifier: Diagnosis of  By: Birdie Riddle MD, Belenda Cruise     Right ankle injury, subsequent encounter 01/11/2018   Right foot pain 08/09/2011   Sinusitis, chronic    Skene's gland abscess 2009   patient unaware   Sprain of unspecified ligament of right ankle, subsequent encounter    TRANSAMINASES, SERUM, ELEVATED 12/07/2009   Qualifier: Diagnosis of  By: Inda Castle FNP, Melissa S    Unspecified cataract    Urinary tract infection, site not specified    Vitamin D deficiency     Current Outpatient Medications on File Prior to Visit  Medication Sig Dispense Refill   albuterol (VENTOLIN HFA) 108 (90 Base) MCG/ACT inhaler Inhale 2 puffs into the lungs every 6 (six) hours as needed for wheezing or shortness of breath. 18 g 0   Ascorbic Acid (VITAMIN C) 1000 MG tablet Take 500 mg by mouth 3 (three) times a week.     aspirin EC 81 MG tablet Take 81 mg by mouth daily. Swallow whole.     azelastine (ASTELIN) 0.1 % nasal spray Place 1 spray into both nostrils daily as needed for allergies.     azelastine (OPTIVAR) 0.05 % ophthalmic solution Place 1 drop into affected eye(s) 2 (two) times daily. 6 mL 3   citalopram (CELEXA) 20 MG tablet Take 1.5 tablets (30 mg total) by mouth daily. 145 tablet  0   diltiazem (CARDIZEM) 30 MG tablet TAKE 1 TABLET BY MOUTH 2 TIMES A DAY IF NEEDED FOR AFIB 45 tablet 1   famotidine (PEPCID) 10 MG tablet Take 1 tablet (10 mg total) by mouth 2 (two) times daily. 90 tablet 0   fluticasone (FLONASE) 50 MCG/ACT nasal spray Place 1-2 sprays into both nostrils daily. 16 g 3   metoprolol succinate (TOPROL-XL) 25 MG 24 hr tablet Take 1 tablet (25 mg total) by mouth daily. 90 tablet 0   Multiple Vitamins-Minerals (MULTI FOR HER 50+) TABS Take 1 tablet by mouth daily.     Omega-3 Fatty Acids (FISH OIL) 1000 MG CAPS Take 1,000 mg by mouth daily.     omeprazole (PRILOSEC) 20 MG capsule Take 1 capsule (20 mg total) by mouth in the morning 30 minutes before morning meal 90 capsule 0   omeprazole (PRILOSEC) 40 MG capsule  (Patient not taking: Reported on 10/10/2021)     Probiotic Product (PROBIOTIC PO) Take 1 capsule by mouth daily. Called ultra spectrum     sucralfate (CARAFATE) 1 g tablet Take 1 tablet (1 g total) by mouth 4 (four) times daily.(between meals & at bedtime) (Patient not taking: Reported on 10/10/2021) 120 tablet 0   traMADol (ULTRAM) 50 MG tablet  (Patient not taking: Reported on 10/10/2021)     No current facility-administered medications on file prior to visit.    Past Surgical History:  Procedure Laterality Date   COLONOSCOPY     DILATION AND CURETTAGE OF UTERUS  8/08   GYNECOLOGIC CRYOSURGERY  1980   HYSTEROSCOPY WITH D & C N/A 03/30/2017   Procedure: DILATATION & CURETTAGE/HYSTEROSCOPY WITH ULTRASOUND GUIDANCE;  Surgeon: Megan Salon, MD;  Location: [facility];  Service: Gynecology;  Laterality: N/A;   UPPER GI ENDOSCOPY     uterine ablation  2007    Allergies  Allergen Reactions   Augmentin [Amoxicillin-Pot Clavulanate] Diarrhea    BP 110/82    Ht 5\' 4"  (1.626 m)    Wt 140 lb (63.5 kg)    LMP 10/02/2006 (Approximate)    BMI 24.03 kg/m   [facility] 08/30/2020 06/08/2021 08/22/2021 11/09/2021  Frequency of aerobic exercise  (# of days/week) 7 7 7 6   Average time in minutes 60 60 60 60  Frequency of strengthening activities (# of days/week) 3 3 2 3     No flowsheet data found.      Objective:  Physical Exam:  Gen: NAD, comfortable in exam room  Neck: No gross deformity, swelling, bruising. TTP bilateral paraspinal cervical and thoracic regions.  No midline/bony TTP. FROM but pain with flexion, bilateral lateral rotations. BUE strength 5/5.   Sensation intact to light touch.  2+ equal reflexes in triceps, biceps, brachioradialis tendons. Negative spurlings. NV intact distal BUEs.   Assessment & Plan:  1. Neck and upper back pain - consistent with underlying arthropathy of cervical and thoracic spine as well as trapezius, erector spinae overuse strain.  Start formal physical therapy.  Heat.  Ibuprofen and tylenol only if needed.  F/u in 5-6 weeks.

## 2021-11-10 DIAGNOSIS — H2513 Age-related nuclear cataract, bilateral: Secondary | ICD-10-CM | POA: Diagnosis not present

## 2021-11-10 DIAGNOSIS — H524 Presbyopia: Secondary | ICD-10-CM | POA: Diagnosis not present

## 2021-11-10 DIAGNOSIS — H5213 Myopia, bilateral: Secondary | ICD-10-CM | POA: Diagnosis not present

## 2021-11-10 DIAGNOSIS — H25013 Cortical age-related cataract, bilateral: Secondary | ICD-10-CM | POA: Diagnosis not present

## 2021-11-11 ENCOUNTER — Encounter: Payer: Self-pay | Admitting: Plastic Surgery

## 2021-11-11 ENCOUNTER — Telehealth (INDEPENDENT_AMBULATORY_CARE_PROVIDER_SITE_OTHER): Payer: Medicare HMO | Admitting: Plastic Surgery

## 2021-11-11 ENCOUNTER — Other Ambulatory Visit: Payer: Self-pay

## 2021-11-11 DIAGNOSIS — H02834 Dermatochalasis of left upper eyelid: Secondary | ICD-10-CM

## 2021-11-11 DIAGNOSIS — H02831 Dermatochalasis of right upper eyelid: Secondary | ICD-10-CM | POA: Diagnosis not present

## 2021-11-11 NOTE — Progress Notes (Signed)
° °  Referring Provider Isaac Bliss, Rayford Halsted, MD [place],  [place] [id number]   CC: Drooping eyelids, visual obstruction.     Kara Warren is an 66 y.o. female.  HPI: Patient is a 66 year old who returns to review her visual field test.  Of note she feels like her eyelids are blocking her vision particularly peripherally.  Review of Systems General: No fever or chills.  Physical Exam Vitals with BMI 11/09/2021 10/10/2021 09/01/2021  Height 5\' 4"  5\' 4"  5\' 5"   Weight 140 lbs 146 lbs 10 oz 151 lbs  BMI 24.02 29.02 11.15  Systolic 520 802 233  Diastolic 82 78 78  Pulse - 55 55    General:  No acute distress,  Alert and oriented, Non-Toxic, Normal speech and affect HEENT: Patient has eyelid skin resting her lashes, MRD, is 3 corneas clear  Visual Field: Reviewed ophthalmology interpretation which shows significant improvement in visual field with taping in both the right and left eye.  In her peripheral vision she shows greater than 15 degrees of improvement in each eye with taping.   Assessment/Plan The patient is a good candidate for functional upper eyelid blepharoplasty.  Risks and benefits of blepharoplasty were discussed and the patient wishes to proceed.  Time based coding phone visit: Time on call: 6 minutes Call type: Video Patient location: Home Physician location: Office We discussed the risk and benefits of upper blepharoplasty and reviewed visual field test.  Lennice Sites 11/11/2021, 2:01 PM

## 2021-11-28 ENCOUNTER — Ambulatory Visit: Payer: Medicare HMO | Attending: Family Medicine | Admitting: Physical Therapy

## 2021-11-28 ENCOUNTER — Other Ambulatory Visit: Payer: Self-pay

## 2021-11-28 ENCOUNTER — Encounter: Payer: Self-pay | Admitting: Physical Therapy

## 2021-11-28 DIAGNOSIS — M542 Cervicalgia: Secondary | ICD-10-CM | POA: Insufficient documentation

## 2021-11-28 DIAGNOSIS — M6281 Muscle weakness (generalized): Secondary | ICD-10-CM | POA: Diagnosis not present

## 2021-11-28 NOTE — Therapy (Signed)
OUTPATIENT PHYSICAL THERAPY EVALUATION   Patient Name: Kara Warren MRN: [id number] DOB:01/16/1956, 66 y.o., female Today's Date: 11/28/2021   PT End of Session - 11/28/21 1146     Visit Number 1    Number of Visits 10    Date for PT Re-Evaluation 01/23/22    Authorization Type [employer] MEDICARE    Progress Note Due on Visit 10    PT Start Time 1130    PT Stop Time 1215    PT Time Calculation (min) 45 min    Activity Tolerance Patient tolerated treatment well    Behavior During Therapy [facility] for tasks assessed/performed             Past Medical History:  Diagnosis Date   Abdominal pain, left upper quadrant    Acute bronchospasm    Acute neck pain 07/02/2019   Acute sinusitis, unspecified 05/20/2009   Centricity Description: SINUSITIS - ACUTE-NOS Qualifier: Diagnosis of  By: Birdie Riddle MD, Belenda Cruise   Centricity Description: ACUTE SINUSITIS, UNSPECIFIED Qualifier: Diagnosis of  By: Alveta Heimlich MD, Eugene   Centricity Description: SINUSITIS, ACUTE Qualifier: Diagnosis of  By: Koleen Nimrod MD, Jeffrey     Atypical chest pain 03/05/2013   Body mass index (BMI) of 23.0-23.9 in adult    Cervicalgia    Colon polyp    Costochondral junction syndrome    Cough    DEPRESSION 08/25/2008   Qualifier: Diagnosis of  By: Ronnald Ramp CMA, Chemira     Diarrhea    Diverticulosis 07/27/2012   Dorsalgia    Elevated blood pressure reading without diagnosis of hypertension    Endometrial polyp 8/08   benign   Essential hypertension 09/20/2020   Family history of osteoporosis 12/28/2011   Mother pt had normal Dexa 11/2009    Fatigue    GERD (gastroesophageal reflux disease)    HIP PAIN, RIGHT 04/12/2009   Qualifier: Diagnosis of  By: Oneida Alar MD, KARL     History of hiatal hernia    HYPERLIPIDEMIA 08/25/2008   Qualifier: Diagnosis of  By: Birdie Riddle MD, Belenda Cruise     IBS (irritable bowel syndrome)    Left hip pain 07/30/2013   Left wrist pain 07/30/2013   Lumbar strain, initial encounter 07/28/2019   LUNG NODULE  11/19/2009   Neg CT     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 Please review for accuracy.   Otalgia, unspecified ear    Pain in throat    Palpitations    Paroxysmal atrial fibrillation (HCC)    Peroneal tendinitis of right lower extremity 03/12/2014   Plantar wart 04/02/2014   Pneumonia    RHINITIS 06/21/2009   Qualifier: Diagnosis of  By: Birdie Riddle MD, Belenda Cruise     Right ankle injury, subsequent encounter 01/11/2018   Right foot pain 08/09/2011   Sinusitis, chronic    Skene's gland abscess 2009   patient unaware   Sprain of unspecified ligament of right ankle, subsequent encounter    TRANSAMINASES, SERUM, ELEVATED 12/07/2009   Qualifier: Diagnosis of  By: Inda Castle FNP, Wellington Hampshire  Unspecified cataract    Urinary tract infection, site not specified    Vitamin D deficiency    Past Surgical History:  Procedure Laterality Date   COLONOSCOPY     DILATION AND CURETTAGE OF UTERUS  8/08   GYNECOLOGIC CRYOSURGERY  1980   HYSTEROSCOPY WITH D & C N/A 03/30/2017   Procedure: DILATATION & CURETTAGE/HYSTEROSCOPY WITH ULTRASOUND GUIDANCE;  Surgeon: Megan Salon, MD;  Location: [facility];  Service: Gynecology;  Laterality: N/A;   UPPER GI ENDOSCOPY     uterine ablation  2007   Patient Active Problem List   Diagnosis Date Noted   Secondary hypercoagulable state ([place]) 09/26/2021   Colonoscopy causing post-procedural bleeding 09/20/2020   Essential hypertension 09/20/2020   AF (paroxysmal atrial fibrillation) ([place]) 09/20/2020   Plantar wart 04/02/2014   Peroneal  tendinitis of right lower extremity 03/12/2014   Diverticulosis 07/27/2012   Sinusitis, chronic    Colon polyp    Menopause    Migraine    NONSPCIFC ABN FINDING RAD & OTH EXAM LUNG FIELD 11/19/2009   HIP PAIN, RIGHT 04/12/2009   Hyperlipidemia 08/25/2008   Depression 08/25/2008    PCP: Isaac Bliss, Rayford Halsted, MD  REFERRING PROVIDER: Dene Gentry, MD  REFERRING DIAG: Neck pain  THERAPY DIAG:  Cervicalgia  Muscle weakness (generalized)  ONSET DATE: ongoing multiple years, gradually worsening  SUBJECTIVE:           SUBJECTIVE STATEMENT: Patient reports some neck pain that can cause headaches, and she feels like she always has pain/tightness in her neck and upper back. States that the pain is more aggravating, but it does not wake her up at night. She does get a massage once a month which helps, and she does work out weight free weights and resistance band. Patient also reports left shoulder/pec tightness.   PERTINENT HISTORY:  None  PAIN:  Are you having pain? Yes NPRS scale: 4/10 Pain location: Neck, upper back Pain orientation: Bilateral PAIN TYPE: Chronic Pain description: Constant, tight, ache Aggravating factors: Nothing specific Relieving factors: OTC meds, heat, stretch, massage  PRECAUTIONS: None  WEIGHT BEARING RESTRICTIONS No  FALLS:  Has patient fallen in last 6 months? No  LIVING ENVIRONMENT: Lives with: lives with their family  OCCUPATION: Retired  PLOF: Independent  PATIENT GOALS: Improve feeling of neck/upper back tightness   OBJECTIVE:  DIAGNOSTIC FINDINGS:  N/A  PATIENT SURVEYS:  FOTO 72% functional status (predicted 72%)  COGNITION: Overall cognitive status: Within functional limits for tasks assessed   SENSATION: Light touch: Appears intact  POSTURE:  Slightly rounded shoulder and forward head posture  PALPATION: TTP bilateral upper trap, cervical paraspinals   CERVICAL AROM  A/PROM AROM (deg) 11/28/2021  Flexion  50  Extension 40  Right lateral flexion 25  Left lateral flexion 20  Right rotation 45  Left rotation 50   Thoracic AROM grossly limited with feeling of tightness primarily in rotation and extension  UE AROM/PROM:   UE AROM grossly WFL and non-painful   UE MMT:  MMT Right 11/28/2021 Left 11/28/2021  Shoulder flexion 5 5  Shoulder extension 4+ 4+  Shoulder abduction 5 5  Shoulder internal rotation 5 5  Shoulder external rotation 5 5  Middle trapezius 4 4-  Lower trapezius 4- 4-   CERVICAL SPECIAL TESTS:  Not performed  FUNCTIONAL TESTS:  DNF endurance: 17 seconds   TODAY'S TREATMENT:  Sidelying thoracic rotation 5 x 5 sec Child's pose thoracic rotation with hand behind head 5 x 5  sec Cat cow 5 x 5 sec Supine thoracic extension over FR at various levels Standing step back thoracic extension stretch 5 x 5 sec  PATIENT EDUCATION:  Education details: Exam findings, POC, HEP Person educated: Patient Education method: Explanation, Demonstration, Tactile cues, Verbal cues, and Handouts Education comprehension: verbalized understanding, returned demonstration, verbal cues required, tactile cues required, and needs further education  HOME EXERCISE PROGRAM: Access Code: [email or web address]   ASSESSMENT: CLINICAL IMPRESSION: Patient is a 66 y.o. female who was seen today for physical therapy evaluation and treatment for neck and upper/mid back pain and tightness. Symptoms seem musculoskeletal in nature without any radicular symptoms. She was provided majority mobility exercises this visit.   OBJECTIVE IMPAIRMENTS decreased activity tolerance, decreased ROM, decreased strength, postural dysfunction, and pain.   ACTIVITY LIMITATIONS cleaning, community activity, meal prep, laundry, yard work, and shopping.   PERSONAL FACTORS Past/current experiences and Time since onset of injury/illness/exacerbation are also affecting patient's functional outcome.    REHAB POTENTIAL:  Good  CLINICAL DECISION MAKING: Stable/uncomplicated  EVALUATION COMPLEXITY: Low   GOALS: Goals reviewed with patient? Yes  SHORT TERM GOALS:  STG Name Target Date Goal status  1 Patient will be I with initial HEP in order to progress with therapy. Baseline: provided at evaluation 12/26/2021 INITIAL  2 PT will review FOTO with patient by 3rd visit in order to understand expected progress and outcome with therapy. Baseline: assessed at evaluation 12/26/2021 INITIAL  3 Patient will report </= 2/10 pain level in order to reduce functional limitations. Baseline: 4/10 resting pain at eval 12/26/2021 INITIAL   LONG TERM GOALS:   LTG Name Target Date Goal status  1 Patient will be I with final HEP to maintain progress from PT. Baseline: provided at evaluation     01/23/2022 INITIAL  2 Patient will report > 72% status on FOTO to indicate improved functional ability. Baseline: 72% at intake with prediction of 72% 01/23/2022 INITIAL  3 Patient will demonstrate periscapular strength grossly >/= 4/5 MMT in order to improve postural control and reduce neck/upper back pain with activity Baseline: grossly 4-/5 MMT 01/23/2022 INITIAL  4 Patient will exhibit DNF endurance >/= 30 seconds to improve postural control and reduce neck tightness with pilates Baseline: 17 seconds 01/23/2022 INITIAL  5 Patient will report </= 1/10 pain level with all activity in order to return to exercising without limitation Baseline: patient report > 4/10 pain with activity 01/23/2022 INITIAL    PLAN: PT FREQUENCY: 1-2x/week  PT DURATION: 8 weeks  PLANNED INTERVENTIONS: Therapeutic exercises, Therapeutic activity, Neuro Muscular re-education, Balance training, Gait training, Patient/Family education, Joint mobilization, Aquatic Therapy, Dry Needling, Electrical stimulation, Spinal mobilization, Cryotherapy, Moist heat, Taping, and Manual therapy  PLAN FOR NEXT SESSION: Review HEP and progress PRN, manual/dry needling for  upper traps/cervical paraspinals/left pec or lat, thoracic mobility, initiate DNF endurance and periscapular strengthening   Hilda Blades, PT, DPT, LAT, ATC 11/28/21  12:41 PM Phone: [phone] Fax: [phone]

## 2021-11-28 NOTE — Patient Instructions (Signed)
Access Code: [facility] URL: [email or web address] Date: 11/28/2021 Prepared by: Hilda Blades  Exercises Sidelying Thoracic Lumbar Rotation - 1 x daily - 10 reps - 5 seconds hold Child's Pose Thoracic Rotation with Hand on Neck - 1 x daily - 10 reps - 5 seconds hold Cat Cow - 1 x daily - 10 reps - 5 seconds hold Thoracic Extension Mobilization on Foam Roll - 1 x daily - 3 sets - 3-5 reps - 5 seconds hold Standing 'L' Stretch at Counter - 1 x daily - 10 reps - 5 seconds hold

## 2021-11-30 ENCOUNTER — Ambulatory Visit: Payer: Medicare HMO | Attending: Family Medicine

## 2021-11-30 ENCOUNTER — Other Ambulatory Visit: Payer: Self-pay

## 2021-11-30 DIAGNOSIS — M6281 Muscle weakness (generalized): Secondary | ICD-10-CM | POA: Diagnosis not present

## 2021-11-30 DIAGNOSIS — M542 Cervicalgia: Secondary | ICD-10-CM | POA: Diagnosis not present

## 2021-11-30 DIAGNOSIS — M62830 Muscle spasm of back: Secondary | ICD-10-CM | POA: Diagnosis not present

## 2021-11-30 NOTE — Patient Instructions (Signed)

## 2021-11-30 NOTE — Therapy (Signed)
OUTPATIENT PHYSICAL THERAPY TREATMENT NOTE   Patient Name: Kara Warren MRN: [id number] DOB:[birth year], 66 y.o., female Today's Date: 11/30/2021  PCP: Isaac Bliss, Rayford Halsted, MD REFERRING PROVIDER: Dene Gentry, MD   PT End of Session - 11/30/21 1122     Visit Number 2    Number of Visits 10    Date for PT Re-Evaluation 01/23/22    Authorization Type [employer] MEDICARE    Progress Note Due on Visit 10    PT Start Time 1018    PT Stop Time 1105    PT Time Calculation (min) 47 min    Activity Tolerance Patient tolerated treatment well    Behavior During Therapy WFL for tasks assessed/performed             Past Medical History:  Diagnosis Date   Abdominal pain, left upper quadrant    Acute bronchospasm    Acute neck pain 07/02/2019   Acute sinusitis, unspecified 05/20/2009   Centricity Description: SINUSITIS - ACUTE-NOS Qualifier: Diagnosis of  By: Birdie Riddle MD, Belenda Cruise   Centricity Description: ACUTE SINUSITIS, UNSPECIFIED Qualifier: Diagnosis of  By: Alveta Heimlich MD, Eugene   Centricity Description: SINUSITIS, ACUTE Qualifier: Diagnosis of  By: Koleen Nimrod MD, Dellis Filbert     Atypical chest pain 03/05/2013   Body mass index (BMI) of 23.0-23.9 in adult    Cervicalgia    Colon polyp    Costochondral junction syndrome    Cough    DEPRESSION 08/25/2008   Qualifier: Diagnosis of  By: Ronnald Ramp CMA, Chemira     Diarrhea    Diverticulosis 07/27/2012   Dorsalgia    Elevated blood pressure reading without diagnosis of hypertension    Endometrial polyp 8/08   benign   Essential hypertension 09/20/2020   Family history of osteoporosis 12/28/2011   Mother pt had normal Dexa 11/2009    Fatigue    GERD (gastroesophageal reflux disease)    HIP PAIN, RIGHT 04/12/2009   Qualifier: Diagnosis of  By: Oneida Alar MD, KARL     History of hiatal hernia    HYPERLIPIDEMIA 08/25/2008   Qualifier: Diagnosis of  By: Birdie Riddle MD, Belenda Cruise     IBS (irritable bowel syndrome)    Left hip pain 07/30/2013   Left  wrist pain 07/30/2013   Lumbar strain, initial encounter 07/28/2019   LUNG NODULE 11/19/2009   Neg CT     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 Please review for accuracy.   Otalgia, unspecified ear    Pain in throat    Palpitations    Paroxysmal atrial fibrillation (HCC)    Peroneal tendinitis of right lower extremity 03/12/2014   Plantar wart 04/02/2014   Pneumonia    RHINITIS 06/21/2009   Qualifier: Diagnosis of  By: Birdie Riddle MD, Belenda Cruise     Right ankle injury, subsequent encounter 01/11/2018   Right foot pain 08/09/2011   Sinusitis, chronic    Skene's gland abscess 2009   patient unaware   Sprain of unspecified ligament of right ankle, subsequent encounter    TRANSAMINASES,  SERUM, ELEVATED 12/07/2009   Qualifier: Diagnosis of  By: Inda Castle FNP, Melissa S    Unspecified cataract    Urinary tract infection, site not specified    Vitamin D deficiency    Past Surgical History:  Procedure Laterality Date   COLONOSCOPY     DILATION AND CURETTAGE OF UTERUS  8/08   GYNECOLOGIC CRYOSURGERY  1980   HYSTEROSCOPY WITH D & C N/A 03/30/2017   Procedure: DILATATION & CURETTAGE/HYSTEROSCOPY WITH ULTRASOUND GUIDANCE;  Surgeon: Megan Salon, MD;  Location: [facility];  Service: Gynecology;  Laterality: N/A;   UPPER GI ENDOSCOPY     uterine ablation  2007   Patient Active Problem List   Diagnosis Date Noted   Secondary hypercoagulable state ([place]) 09/26/2021   Colonoscopy causing post-procedural bleeding 09/20/2020   Essential hypertension 09/20/2020   AF  (paroxysmal atrial fibrillation) ([place]) 09/20/2020   Plantar wart 04/02/2014   Peroneal tendinitis of right lower extremity 03/12/2014   Diverticulosis 07/27/2012   Sinusitis, chronic    Colon polyp    Menopause    Migraine    NONSPCIFC ABN FINDING RAD & OTH EXAM LUNG FIELD 11/19/2009   HIP PAIN, RIGHT 04/12/2009   Hyperlipidemia 08/25/2008   Depression 08/25/2008    REFERRING DIAG: Neck pain  THERAPY DIAG:  Cervicalgia  Muscle weakness (generalized)  Muscle spasm of back  SUBJECTIVE:           SUBJECTIVE STATEMENT: Pt reports she is completing her HEP consistently.  PAIN:  Are you having pain? Yes NPRS scale: 4/10 Pain location: Neck, upper back Pain orientation: Bilateral PAIN TYPE: Chronic Pain description: Constant, tight, ache Aggravating factors: Nothing specific Relieving factors: OTC meds, heat, stretch, massage  PERTINENT HISTORY:  None  PRECAUTIONS: None  WEIGHT BEARING RESTRICTIONS No  FALLS:  Has patient fallen in last 6 months? No  LIVING ENVIRONMENT: Lives with: lives with their family  OCCUPATION: Retired  PLOF: Independent  PATIENT GOALS: Improve feeling of neck/upper back tightness   OBJECTIVE:  DIAGNOSTIC FINDINGS:  N/A  PATIENT SURVEYS:  FOTO 72% functional status (predicted 72%)  COGNITION: Overall cognitive status: Within functional limits for tasks assessed   SENSATION: Light touch: Appears intact  POSTURE:  Slightly rounded shoulder and forward head posture  PALPATION: TTP bilateral upper trap, cervical paraspinals   CERVICAL AROM  A/PROM AROM (deg) 11/30/2021  Flexion 50  Extension 40  Right lateral flexion 25  Left lateral flexion 20  Right rotation 45  Left rotation 50   Thoracic AROM grossly limited with feeling of tightness primarily in rotation and extension  UE AROM/PROM:   UE AROM grossly WFL and non-painful   UE MMT:  MMT Right 11/30/2021 Left 11/30/2021  Shoulder flexion 5 5  Shoulder extension  4+ 4+  Shoulder abduction 5 5  Shoulder internal rotation 5 5  Shoulder external rotation 5 5  Middle trapezius 4 4-  Lower trapezius 4- 4-   CERVICAL SPECIAL TESTS:  Not performed  FUNCTIONAL TESTS:  DNF endurance: 17 seconds   TODAY'S TREATMENT:  [facility] Adult PT Treatment:                                                DATE: 11/30/21 Therapeutic Exercise: Cervical retraction x10 3"  Cervical rotation c retraction x5 3" Cervical lat flex c retraction x5 3" Shoulder row 2x10 GTB  Shoulder ext 2x10 GTB Shoulder ER 2x10 GTB Manual Therapy: STM to the upper cervical paraspinals, suboccipitals, and upper traps. Self Care: Updated HEP   Trigger Point Dry Needling Treatment: Skilled palpation to ID taut muscles and TPs  Pre-treatment instruction: Patient instructed on dry needling rationale, procedures, and possible side effects including pain during treatment (achy,cramping feeling), bruising, drop of blood, lightheadedness, nausea, sweating. Patient Consent Given: Yes Education handout provided: Yes Muscles treated: Bilat OCI and upper cervical multifidi  Needle size and number:  .30x27mm, 2 Electrical stimulation performed: No Parameters: N/A Treatment response/outcome: Twitch response elicited, Palpable decrease in muscle tension, and deep ache, pressure Post-treatment instructions: Patient instructed to expect possible mild to moderate muscle soreness later today and/or tomorrow. Patient instructed in methods to reduce muscle soreness and to continue prescribed HEP. If patient was dry needled over the lung field, patient was instructed on signs and symptoms of pneumothorax and, however unlikely, to see immediate medical attention should they occur. Patient was also educated on signs and symptoms of infection and to seek medical attention should they occur. Patient verbalized understanding of these instructions and education.   EVAL TREATMENT: Sidelying thoracic rotation 5 x 5  sec Child's pose thoracic rotation with hand behind head 5 x 5 sec Cat cow 5 x 5 sec Supine thoracic extension over FR at various levels Standing step back thoracic extension stretch 5 x 5 sec  PATIENT EDUCATION:  Education details: Exam findings, POC, HEP Person educated: Patient Education method: Explanation, Demonstration, Tactile cues, Verbal cues, and Handouts Education comprehension: verbalized understanding, returned demonstration, verbal cues required, tactile cues required, and needs further education  HOME EXERCISE PROGRAM: Access Code: [email or web address]   ASSESSMENT: CLINICAL IMPRESSION: PT was completed for STM to the suboccipitals, cervical paraspinals, and upper traps. TPDN was then completed suboccipital OCI and the cervical multifidi. TPDN was f/b cervical therex for ROM/flexibility and posterior chain strengthening. Post session, pt reported her neck was feeling better. Pt tolerated the session without adverse effects.   OBJECTIVE IMPAIRMENTS decreased activity tolerance, decreased ROM, decreased strength, postural dysfunction, and pain.   ACTIVITY LIMITATIONS cleaning, community activity, meal prep, laundry, yard work, and shopping.   PERSONAL FACTORS Past/current experiences and Time since onset of injury/illness/exacerbation are also affecting patient's functional outcome.    GOALS: Goals reviewed with patient? Yes  SHORT TERM GOALS:  STG Name Target Date Goal status  1 Patient will be I with initial HEP in order to progress with therapy. Baseline: provided at evaluation 12/28/2021 INITIAL  2 PT will review FOTO with patient by 3rd visit in order to understand expected progress and outcome with therapy. Baseline: assessed at evaluation 12/28/2021 INITIAL  3 Patient will report </= 2/10 pain level in order to reduce functional limitations. Baseline: 4/10 resting pain at eval 12/28/2021 INITIAL   LONG TERM GOALS:   LTG Name Target Date Goal status  1 Patient will be I  with final HEP to maintain progress from PT. Baseline: provided at evaluation     01/25/2022 INITIAL  2 Patient will report > 72% status on FOTO to indicate improved functional ability. Baseline: 72% at intake with prediction of 72% 01/25/2022 INITIAL  3 Patient will demonstrate periscapular strength grossly >/= 4/5 MMT in order to improve postural control and reduce neck/upper back pain with activity Baseline: grossly 4-/5 MMT 01/25/2022 INITIAL  4 Patient will exhibit DNF endurance >/= 30 seconds to improve postural control and reduce neck tightness with pilates Baseline: 17 seconds 01/25/2022 INITIAL  5 Patient will report </= 1/10 pain level with all activity in order to return to exercising without limitation Baseline: patient report > 4/10 pain with activity 01/25/2022 INITIAL    PLAN: PT FREQUENCY: 1-2x/week  PT DURATION: 8 weeks  PLANNED INTERVENTIONS: Therapeutic exercises, Therapeutic activity, Neuro Muscular re-education, Balance training, Gait training, Patient/Family education, Joint mobilization, Aquatic Therapy, Dry Needling, Electrical stimulation, Spinal mobilization, Cryotherapy, Moist heat, Taping, and Manual therapy  PLAN FOR NEXT SESSION: Review HEP and progress PRN, manual/dry needling for upper traps/cervical paraspinals/left pec or lat, thoracic mobility, initiate DNF endurance and periscapular strengthening. Assess repsonse to TPDN.  Nairobi Gustafson MS, PT 11/30/21 11:46 AM

## 2021-12-01 ENCOUNTER — Other Ambulatory Visit (HOSPITAL_COMMUNITY): Payer: Self-pay

## 2021-12-01 MED ORDER — CLENPIQ 10-3.5-12 MG-GM -GM/160ML PO SOLN
160.0000 mL | ORAL | 0 refills | Status: DC
  Filled 2021-12-01: qty 320, 1d supply, fill #0

## 2021-12-02 NOTE — Therapy (Signed)
OUTPATIENT PHYSICAL THERAPY TREATMENT NOTE   Patient Name: Kara Warren MRN: [id number] DOB:1956/07/06, 65 y.o., female Today's Date: 12/05/2021  PCP: Isaac Bliss, Rayford Halsted, MD REFERRING PROVIDER: Isaac Bliss, Estel*   PT End of Session - 12/05/21 [phone]     Visit Number 3    Number of Visits 10    Date for PT Re-Evaluation 01/23/22    Authorization Type [employer] MEDICARE    Progress Note Due on Visit 10    PT Start Time 0915    PT Stop Time 1000    PT Time Calculation (min) 45 min    Activity Tolerance Patient tolerated treatment well    Behavior During Therapy [facility] for tasks assessed/performed              Past Medical History:  Diagnosis Date   Abdominal pain, left upper quadrant    Acute bronchospasm    Acute neck pain 07/02/2019   Acute sinusitis, unspecified 05/20/2009   Centricity Description: SINUSITIS - ACUTE-NOS Qualifier: Diagnosis of  By: Birdie Riddle MD, Belenda Cruise   Centricity Description: ACUTE SINUSITIS, UNSPECIFIED Qualifier: Diagnosis of  By: Alveta Heimlich MD, Eugene   Centricity Description: SINUSITIS, ACUTE Qualifier: Diagnosis of  By: Koleen Nimrod MD, Dellis Filbert     Atypical chest pain 03/05/2013   Body mass index (BMI) of 23.0-23.9 in adult    Cervicalgia    Colon polyp    Costochondral junction syndrome    Cough    DEPRESSION 08/25/2008   Qualifier: Diagnosis of  By: Ronnald Ramp CMA, Chemira     Diarrhea    Diverticulosis 07/27/2012   Dorsalgia    Elevated blood pressure reading without diagnosis of hypertension    Endometrial polyp 8/08   benign   Essential hypertension 09/20/2020   Family history of osteoporosis 12/28/2011   Mother pt had normal Dexa 11/2009    Fatigue    GERD (gastroesophageal reflux disease)    HIP PAIN, RIGHT 04/12/2009   Qualifier: Diagnosis of  By: Oneida Alar MD, KARL     History of hiatal hernia    HYPERLIPIDEMIA 08/25/2008   Qualifier: Diagnosis of  By: Birdie Riddle MD, Belenda Cruise     IBS (irritable bowel syndrome)    Left hip pain 07/30/2013    Left wrist pain 07/30/2013   Lumbar strain, initial encounter 07/28/2019   LUNG NODULE 11/19/2009   Neg CT     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 Please review for accuracy.   Otalgia, unspecified ear    Pain in throat    Palpitations    Paroxysmal atrial fibrillation (HCC)    Peroneal tendinitis of right lower extremity 03/12/2014   Plantar wart 04/02/2014   Pneumonia    RHINITIS 06/21/2009   Qualifier: Diagnosis of  By: Birdie Riddle MD, Belenda Cruise     Right ankle injury, subsequent encounter 01/11/2018   Right foot pain 08/09/2011   Sinusitis, chronic    Skene's gland abscess 2009   patient unaware   Sprain of unspecified ligament of right ankle, subsequent encounter    TRANSAMINASES,  SERUM, ELEVATED 12/07/2009   Qualifier: Diagnosis of  By: Inda Castle FNP, Melissa S    Unspecified cataract    Urinary tract infection, site not specified    Vitamin D deficiency    Past Surgical History:  Procedure Laterality Date   COLONOSCOPY     DILATION AND CURETTAGE OF UTERUS  8/08   GYNECOLOGIC CRYOSURGERY  1980   HYSTEROSCOPY WITH D & C N/A 03/30/2017   Procedure: DILATATION & CURETTAGE/HYSTEROSCOPY WITH ULTRASOUND GUIDANCE;  Surgeon: Megan Salon, MD;  Location: [place] [facility];  Service: Gynecology;  Laterality: N/A;   UPPER GI ENDOSCOPY     uterine ablation  2007   Patient Active Problem List   Diagnosis Date Noted   Secondary hypercoagulable state ([place]) 09/26/2021   Colonoscopy causing post-procedural bleeding 09/20/2020   Essential hypertension 09/20/2020   AF  (paroxysmal atrial fibrillation) ([place]) 09/20/2020   Plantar wart 04/02/2014   Peroneal tendinitis of right lower extremity 03/12/2014   Diverticulosis 07/27/2012   Sinusitis, chronic    Colon polyp    Menopause    Migraine    NONSPCIFC ABN FINDING RAD & OTH EXAM LUNG FIELD 11/19/2009   HIP PAIN, RIGHT 04/12/2009   Hyperlipidemia 08/25/2008   Depression 08/25/2008    REFERRING DIAG: Neck pain  THERAPY DIAG:  Cervicalgia  Muscle weakness (generalized)  SUBJECTIVE: Patient reports improvement in symptoms. She has been consistent with her exercises. She has a massage scheduled for after therapy on Wednesday.  PAIN:  Are you having pain? Yes NPRS scale: 2-3/10 Pain location: Neck, upper back Pain orientation: Bilateral PAIN TYPE: Chronic Pain description: Constant, tight, ache Aggravating factors: Nothing specific Relieving factors: OTC meds, heat, stretch, massage  PATIENT GOALS: Improve feeling of neck/upper back tightness   OBJECTIVE:  PATIENT SURVEYS:  FOTO 72% functional status (predicted 72%)  POSTURE:  Slightly rounded shoulder and forward head posture  PALPATION: TTP bilateral upper trap, cervical paraspinals   CERVICAL AROM  A/PROM AROM (deg) 11/28/2021  Flexion 50  Extension 40  Right lateral flexion 25  Left lateral flexion 20  Right rotation 45  Left rotation 50   Thoracic AROM grossly limited with feeling of tightness primarily in rotation and extension  UE AROM/PROM:   UE AROM grossly WFL and non-painful   UE MMT:  MMT Right 11/28/2021 Left 11/28/2021  Shoulder flexion 5 5  Shoulder extension 4+ 4+  Shoulder abduction 5 5  Shoulder internal rotation 5 5  Shoulder external rotation 5 5  Middle trapezius 4 4-  Lower trapezius 4- 4-   FUNCTIONAL TESTS:  DNF endurance: 17 seconds   TODAY'S TREATMENT:   12/05/2021:  Therapeutic Exercise: UBE L1 x 4 min (2 fwd/bwd) Sidelying thoracic rotation 5 x 5 sec hold Child's pose x 20 sec  forward, x 20 sec each side Cat cow x 5 Supine thoracic extension over FR at various levels Machine low row 25# 2 x 15 Double er and scap retraction with green 2 x 15  Manual Therapy: Skilled palpation and monitoring of muscle tension while performing TPDN STM bilaterally upper trap regions Suboccipital release with gentle manual traction x 3 bouts Passive upper trap and levator stretch  Trigger Point Dry Needling Treatment: Pre-treatment instruction: Patient instructed on dry needling rationale, procedures, and possible side effects including pain during treatment (achy,cramping feeling), bruising, drop of blood, lightheadedness, nausea, sweating. Patient Consent Given: Yes Education handout provided: No Muscles treated: bilateral upper traps  Needle size and number: .30x26mm x 4 Electrical stimulation performed:  No Parameters: N/A Treatment response/outcome: Twitch response elicited, Palpable decrease in muscle tension, and patient report reduced tightness. Post-treatment instructions: Patient instructed to expect possible mild to moderate muscle soreness later today and/or tomorrow. Patient instructed in methods to reduce muscle soreness and to continue prescribed HEP. If patient was dry needled over the lung field, patient was instructed on signs and symptoms of pneumothorax and, however unlikely, to see immediate medical attention should they occur. Patient was also educated on signs and symptoms of infection and to seek medical attention should they occur. Patient verbalized understanding of these instructions and education.   11/30/2021 Therapeutic Exercise: Cervical retraction x10 3"  Cervical rotation c retraction x5 3" Cervical lat flex c retraction x5 3" Shoulder row 2x10 GTB Shoulder ext 2x10 GTB Shoulder ER 2x10 GTB Manual Therapy: STM to the upper cervical paraspinals, suboccipitals, and upper traps. Self Care: Updated HEP Trigger Point Dry Needling Treatment: Skilled  palpation to ID taut muscles and TPs  Pre-treatment instruction: Patient instructed on dry needling rationale, procedures, and possible side effects including pain during treatment (achy,cramping feeling), bruising, drop of blood, lightheadedness, nausea, sweating. Patient Consent Given: Yes Education handout provided: Yes Muscles treated: Bilat OCI and upper cervical multifidi  Needle size and number:  .30x8mm, 2 Electrical stimulation performed: No Parameters: N/A Treatment response/outcome: Twitch response elicited, Palpable decrease in muscle tension, and deep ache, pressure Post-treatment instructions: Patient instructed to expect possible mild to moderate muscle soreness later today and/or tomorrow. Patient instructed in methods to reduce muscle soreness and to continue prescribed HEP. If patient was dry needled over the lung field, patient was instructed on signs and symptoms of pneumothorax and, however unlikely, to see immediate medical attention should they occur. Patient was also educated on signs and symptoms of infection and to seek medical attention should they occur. Patient verbalized understanding of these instructions and education.   11/28/2021 (evaluations): Therapeutic Exercise: Sidelying thoracic rotation 5 x 5 sec Child's pose thoracic rotation with hand behind head 5 x 5 sec Cat cow 5 x 5 sec Supine thoracic extension over FR at various levels Standing step back thoracic extension stretch 5 x 5 sec  PATIENT EDUCATION:  Education details: HEP, TPDN Person educated: Patient Education method: Explanation, Demonstration, Tactile cues, and Verbal cues Education comprehension: verbalized understanding, returned demonstration, verbal cues required, tactile cues required, and needs further education  HOME EXERCISE PROGRAM: Access Code: [email or web address]   ASSESSMENT: CLINICAL IMPRESSION: Patient tolerated therapy well with no adverse effects. Continued with TPDN treatment for  bilateral upper traps and patient reported good benefit. Therapy continues to focus on cervical and thoracic mobility and progressing postural control. She seems to be progressing well with therapy and responding well to the dry needling treatment to reduce muscle tension. She is reporting improvement in her symptoms. No changes to HEP this visit. Patient would benefit from continued skilled PT to progress mobility and strength in order to reduce pain and maximize functional ability.  OBJECTIVE IMPAIRMENTS decreased activity tolerance, decreased ROM, decreased strength, postural dysfunction, and pain.   ACTIVITY LIMITATIONS cleaning, community activity, meal prep, laundry, yard work, and shopping.   PERSONAL FACTORS Past/current experiences and Time since onset of injury/illness/exacerbation are also affecting patient's functional outcome.    GOALS: Goals reviewed with patient? Yes  SHORT TERM GOALS:  STG Name Target Date Goal status  1 Patient will be I with initial HEP in order to progress with therapy. Baseline: provided at evaluation 01/02/2022 INITIAL  2 PT will review  FOTO with patient by 3rd visit in order to understand expected progress and outcome with therapy. Baseline: assessed at evaluation 12/05/2021: reviewed 01/02/2022 MET  3 Patient will report </= 2/10 pain level in order to reduce functional limitations. Baseline: 4/10 resting pain at eval 01/02/2022 INITIAL   LONG TERM GOALS:   LTG Name Target Date Goal status  1 Patient will be I with final HEP to maintain progress from PT. Baseline: provided at evaluation     01/30/2022 INITIAL  2 Patient will report > 72% status on FOTO to indicate improved functional ability. Baseline: 72% at intake with prediction of 72% 01/30/2022 INITIAL  3 Patient will demonstrate periscapular strength grossly >/= 4/5 MMT in order to improve postural control and reduce neck/upper back pain with activity Baseline: grossly 4-/5 MMT 01/30/2022 INITIAL  4  Patient will exhibit DNF endurance >/= 30 seconds to improve postural control and reduce neck tightness with pilates Baseline: 17 seconds 01/30/2022 INITIAL  5 Patient will report </= 1/10 pain level with all activity in order to return to exercising without limitation Baseline: patient report > 4/10 pain with activity 01/30/2022 INITIAL    PLAN: PT FREQUENCY: 1-2x/week  PT DURATION: 8 weeks  PLANNED INTERVENTIONS: Therapeutic exercises, Therapeutic activity, Neuro Muscular re-education, Balance training, Gait training, Patient/Family education, Joint mobilization, Aquatic Therapy, Dry Needling, Electrical stimulation, Spinal mobilization, Cryotherapy, Moist heat, Taping, and Manual therapy  PLAN FOR NEXT SESSION: Review HEP and progress PRN, manual/dry needling for upper traps/cervical paraspinals/left pec or lat, thoracic mobility, initiate DNF endurance and periscapular strengthening   Hilda Blades, PT, DPT, LAT, ATC 12/05/21  10:02 AM Phone: [phone] Fax: [phone]

## 2021-12-05 ENCOUNTER — Encounter: Payer: Self-pay | Admitting: Physical Therapy

## 2021-12-05 ENCOUNTER — Other Ambulatory Visit: Payer: Self-pay

## 2021-12-05 ENCOUNTER — Ambulatory Visit: Payer: Medicare HMO | Admitting: Physical Therapy

## 2021-12-05 DIAGNOSIS — M542 Cervicalgia: Secondary | ICD-10-CM

## 2021-12-05 DIAGNOSIS — M6281 Muscle weakness (generalized): Secondary | ICD-10-CM | POA: Diagnosis not present

## 2021-12-05 DIAGNOSIS — M62830 Muscle spasm of back: Secondary | ICD-10-CM | POA: Diagnosis not present

## 2021-12-05 NOTE — Therapy (Signed)
OUTPATIENT PHYSICAL THERAPY TREATMENT NOTE   Patient Name: Kara Warren MRN: [id number] DOB:06/29/1956, 66 y.o., female Today's Date: 12/07/2021  PCP: Isaac Bliss, Rayford Halsted, MD REFERRING PROVIDER: Dene Gentry, MD   PT End of Session - 12/07/21 1216     Visit Number 4    Number of Visits 10    Date for PT Re-Evaluation 01/23/22    Authorization Type [employer] MEDICARE    Progress Note Due on Visit 10    PT Start Time 1215    PT Stop Time 1255    PT Time Calculation (min) 40 min    Activity Tolerance Patient tolerated treatment well    Behavior During Therapy [facility] for tasks assessed/performed               Past Medical History:  Diagnosis Date   Abdominal pain, left upper quadrant    Acute bronchospasm    Acute neck pain 07/02/2019   Acute sinusitis, unspecified 05/20/2009   Centricity Description: SINUSITIS - ACUTE-NOS Qualifier: Diagnosis of  By: Birdie Riddle MD, Belenda Cruise   Centricity Description: ACUTE SINUSITIS, UNSPECIFIED Qualifier: Diagnosis of  By: Alveta Heimlich MD, Eugene   Centricity Description: SINUSITIS, ACUTE Qualifier: Diagnosis of  By: Koleen Nimrod MD, Dellis Filbert     Atypical chest pain 03/05/2013   Body mass index (BMI) of 23.0-23.9 in adult    Cervicalgia    Colon polyp    Costochondral junction syndrome    Cough    DEPRESSION 08/25/2008   Qualifier: Diagnosis of  By: Ronnald Ramp CMA, Chemira     Diarrhea    Diverticulosis 07/27/2012   Dorsalgia    Elevated blood pressure reading without diagnosis of hypertension    Endometrial polyp 8/08   benign   Essential hypertension 09/20/2020   Family history of osteoporosis 12/28/2011   Mother pt had normal Dexa 11/2009    Fatigue    GERD (gastroesophageal reflux disease)    HIP PAIN, RIGHT 04/12/2009   Qualifier: Diagnosis of  By: Oneida Alar MD, KARL     History of hiatal hernia    HYPERLIPIDEMIA 08/25/2008   Qualifier: Diagnosis of  By: Birdie Riddle MD, Belenda Cruise     IBS (irritable bowel syndrome)    Left hip pain 07/30/2013    Left wrist pain 07/30/2013   Lumbar strain, initial encounter 07/28/2019   LUNG NODULE 11/19/2009   Neg CT     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 Please review for accuracy.   Otalgia, unspecified ear    Pain in throat    Palpitations    Paroxysmal atrial fibrillation (HCC)    Peroneal tendinitis of right lower extremity 03/12/2014   Plantar wart 04/02/2014   Pneumonia    RHINITIS 06/21/2009   Qualifier: Diagnosis of  By: Birdie Riddle MD, Belenda Cruise     Right ankle injury, subsequent encounter 01/11/2018   Right foot pain 08/09/2011   Sinusitis, chronic    Skene's gland abscess 2009   patient unaware   Sprain of unspecified ligament of right ankle, subsequent encounter  TRANSAMINASES, SERUM, ELEVATED 12/07/2009   Qualifier: Diagnosis of  By: Inda Castle FNP, Melissa S    Unspecified cataract    Urinary tract infection, site not specified    Vitamin D deficiency    Past Surgical History:  Procedure Laterality Date   COLONOSCOPY     DILATION AND CURETTAGE OF UTERUS  8/08   GYNECOLOGIC CRYOSURGERY  1980   HYSTEROSCOPY WITH D & C N/A 03/30/2017   Procedure: DILATATION & CURETTAGE/HYSTEROSCOPY WITH ULTRASOUND GUIDANCE;  Surgeon: Megan Salon, MD;  Location: [facility];  Service: Gynecology;  Laterality: N/A;   UPPER GI ENDOSCOPY     uterine ablation  2007   Patient Active Problem List   Diagnosis Date Noted   Secondary hypercoagulable state ([place]) 09/26/2021   Colonoscopy causing post-procedural bleeding 09/20/2020   Essential hypertension 09/20/2020   AF  (paroxysmal atrial fibrillation) ([place]) 09/20/2020   Plantar wart 04/02/2014   Peroneal tendinitis of right lower extremity 03/12/2014   Diverticulosis 07/27/2012   Sinusitis, chronic    Colon polyp    Menopause    Migraine    NONSPCIFC ABN FINDING RAD & OTH EXAM LUNG FIELD 11/19/2009   HIP PAIN, RIGHT 04/12/2009   Hyperlipidemia 08/25/2008   Depression 08/25/2008    REFERRING DIAG: Neck pain  THERAPY DIAG:  Cervicalgia  Muscle weakness (generalized)  SUBJECTIVE: Patient reports she was feeling ok after last visit, yesterday she was a little sore but today is better.   PAIN:  Are you having pain? Yes NPRS scale: 2-3/10 Pain location: Neck, upper back Pain orientation: Bilateral PAIN TYPE: Chronic Pain description: Constant, tight, ache Aggravating factors: Nothing specific Relieving factors: OTC meds, heat, stretch, massage  PATIENT GOALS: Improve feeling of neck/upper back tightness   OBJECTIVE:  PATIENT SURVEYS:  FOTO 72% functional status (predicted 72%)  POSTURE:  Slightly rounded shoulder and forward head posture  PALPATION: TTP bilateral upper trap, cervical paraspinals   CERVICAL AROM  A/PROM AROM (deg) 11/28/2021  Flexion 50  Extension 40  Right lateral flexion 25  Left lateral flexion 20  Right rotation 45  Left rotation 50   Thoracic AROM grossly limited with feeling of tightness primarily in rotation and extension  UE AROM/PROM:   UE AROM grossly WFL and non-painful   UE MMT:  MMT Right 11/28/2021 Left 11/28/2021  Shoulder flexion 5 5  Shoulder extension 4+ 4+  Shoulder abduction 5 5  Shoulder internal rotation 5 5  Shoulder external rotation 5 5  Middle trapezius 4 4-  Lower trapezius 4- 4-   FUNCTIONAL TESTS:  DNF endurance: 17 seconds   TODAY'S TREATMENT:   12/07/2021:  Therapeutic Exercise: UBE L1 x 4 min (2 fwd/bwd) Sidelying thoracic rotation x 10 each Supine thoracic extension over FR at various levels Child's pose  thoracic extension stretch with FR 5 x 5 sec Quadruped thread the needle with FM x 5 each Cat cow x 5 Supine cervical retraction with small towel roll under neck 2 x 10 with 5 sec hold Supine horizontal abduction with green 2 x 10 Double er and scap retraction with green x 15 1/2 kneeing arrow pull at wall with green x 10 each 1/2 kneeling thoracic rotation at wall x 5 each (towards and away)   12/05/2021:  Therapeutic Exercise: UBE L1 x 4 min (2 fwd/bwd) Sidelying thoracic rotation 5 x 5 sec hold Child's pose x 20 sec forward, x 20 sec each side Cat cow x 5 Supine thoracic extension over FR at various levels  Machine low row 25# 2 x 15 Double er and scap retraction with green 2 x 15  Manual Therapy: Skilled palpation and monitoring of muscle tension while performing TPDN STM bilaterally upper trap regions Suboccipital release with gentle manual traction x 3 bouts Passive upper trap and levator stretch  Trigger Point Dry Needling Treatment: Pre-treatment instruction: Patient instructed on dry needling rationale, procedures, and possible side effects including pain during treatment (achy,cramping feeling), bruising, drop of blood, lightheadedness, nausea, sweating. Patient Consent Given: Yes Education handout provided: No Muscles treated: bilateral upper traps  Needle size and number: .30x2m x 4 Electrical stimulation performed: No Parameters: N/A Treatment response/outcome: Twitch response elicited, Palpable decrease in muscle tension, and patient report reduced tightness. Post-treatment instructions: Patient instructed to expect possible mild to moderate muscle soreness later today and/or tomorrow. Patient instructed in methods to reduce muscle soreness and to continue prescribed HEP. If patient was dry needled over the lung field, patient was instructed on signs and symptoms of pneumothorax and, however unlikely, to see immediate medical attention should they occur. Patient was also  educated on signs and symptoms of infection and to seek medical attention should they occur. Patient verbalized understanding of these instructions and education.  11/30/2021 Therapeutic Exercise: Cervical retraction x10 3"  Cervical rotation c retraction x5 3" Cervical lat flex c retraction x5 3" Shoulder row 2x10 GTB Shoulder ext 2x10 GTB Shoulder ER 2x10 GTB Manual Therapy: STM to the upper cervical paraspinals, suboccipitals, and upper traps. Self Care: Updated HEP Trigger Point Dry Needling Treatment: Skilled palpation to ID taut muscles and TPs  Pre-treatment instruction: Patient instructed on dry needling rationale, procedures, and possible side effects including pain during treatment (achy,cramping feeling), bruising, drop of blood, lightheadedness, nausea, sweating. Patient Consent Given: Yes Education handout provided: Yes Muscles treated: Bilat OCI and upper cervical multifidi  Needle size and number:  .30x368m 2 Electrical stimulation performed: No Parameters: N/A Treatment response/outcome: Twitch response elicited, Palpable decrease in muscle tension, and deep ache, pressure Post-treatment instructions: Patient instructed to expect possible mild to moderate muscle soreness later today and/or tomorrow. Patient instructed in methods to reduce muscle soreness and to continue prescribed HEP. If patient was dry needled over the lung field, patient was instructed on signs and symptoms of pneumothorax and, however unlikely, to see immediate medical attention should they occur. Patient was also educated on signs and symptoms of infection and to seek medical attention should they occur. Patient verbalized understanding of these instructions and education.   PATIENT EDUCATION:  Education details: HEP Person educated: Patient Education method: ExEducation officer, environmentalTaCorporate treasurerues, and Verbal cues Education comprehension: verbalized understanding, returned demonstration, verbal  cues required, tactile cues required, and needs further education  HOME EXERCISE PROGRAM: Access Code: [email or web address] ASSESSMENT: CLINICAL IMPRESSION: Patient tolerated therapy well with no adverse effects. Therapy primarily focused on progression of cervical and thoracic mobility and postural strengthening and endurance training. Patient does seem to be improving with therapy and does not report any increase in pain with therapy. No changes to HEP this visit. Patient would benefit from continued skilled PT to progress mobility and strength in order to reduce pain and maximize functional ability.   OBJECTIVE IMPAIRMENTS decreased activity tolerance, decreased ROM, decreased strength, postural dysfunction, and pain.   ACTIVITY LIMITATIONS cleaning, community activity, meal prep, laundry, yard work, and shopping.   PERSONAL FACTORS Past/current experiences and Time since onset of injury/illness/exacerbation are also affecting patient's functional outcome.    GOALS: Goals reviewed with patient?  Yes  SHORT TERM GOALS:  STG Name Target Date Goal status  1 Patient will be I with initial HEP in order to progress with therapy. Baseline: provided at evaluation 01/04/2022 INITIAL  2 PT will review FOTO with patient by 3rd visit in order to understand expected progress and outcome with therapy. Baseline: assessed at evaluation 12/05/2021: reviewed 01/04/2022 MET  3 Patient will report </= 2/10 pain level in order to reduce functional limitations. Baseline: 4/10 resting pain at eval 01/04/2022 INITIAL   LONG TERM GOALS:   LTG Name Target Date Goal status  1 Patient will be I with final HEP to maintain progress from PT. Baseline: provided at evaluation     02/01/2022 INITIAL  2 Patient will report > 72% status on FOTO to indicate improved functional ability. Baseline: 72% at intake with prediction of 72% 02/01/2022 INITIAL  3 Patient will demonstrate periscapular strength grossly >/= 4/5 MMT in order to  improve postural control and reduce neck/upper back pain with activity Baseline: grossly 4-/5 MMT 02/01/2022 INITIAL  4 Patient will exhibit DNF endurance >/= 30 seconds to improve postural control and reduce neck tightness with pilates Baseline: 17 seconds 02/01/2022 INITIAL  5 Patient will report </= 1/10 pain level with all activity in order to return to exercising without limitation Baseline: patient report > 4/10 pain with activity 02/01/2022 INITIAL    PLAN: PT FREQUENCY: 1-2x/week  PT DURATION: 8 weeks  PLANNED INTERVENTIONS: Therapeutic exercises, Therapeutic activity, Neuro Muscular re-education, Balance training, Gait training, Patient/Family education, Joint mobilization, Aquatic Therapy, Dry Needling, Electrical stimulation, Spinal mobilization, Cryotherapy, Moist heat, Taping, and Manual therapy  PLAN FOR NEXT SESSION: Review HEP and progress PRN, manual/dry needling for upper traps/cervical paraspinals/left pec or lat, thoracic mobility, initiate DNF endurance and periscapular strengthening   Hilda Blades, PT, DPT, LAT, ATC 12/07/21  1:35 PM Phone: [phone] Fax: [phone]

## 2021-12-07 ENCOUNTER — Encounter: Payer: Self-pay | Admitting: Physical Therapy

## 2021-12-07 ENCOUNTER — Ambulatory Visit: Payer: Medicare HMO | Admitting: Physical Therapy

## 2021-12-07 ENCOUNTER — Other Ambulatory Visit: Payer: Self-pay

## 2021-12-07 DIAGNOSIS — M6281 Muscle weakness (generalized): Secondary | ICD-10-CM

## 2021-12-07 DIAGNOSIS — M542 Cervicalgia: Secondary | ICD-10-CM

## 2021-12-07 DIAGNOSIS — M62830 Muscle spasm of back: Secondary | ICD-10-CM | POA: Diagnosis not present

## 2021-12-09 ENCOUNTER — Encounter: Payer: Self-pay | Admitting: Plastic Surgery

## 2021-12-09 ENCOUNTER — Other Ambulatory Visit (HOSPITAL_COMMUNITY): Payer: Self-pay

## 2021-12-09 ENCOUNTER — Ambulatory Visit (INDEPENDENT_AMBULATORY_CARE_PROVIDER_SITE_OTHER): Payer: Medicare HMO | Admitting: Plastic Surgery

## 2021-12-09 ENCOUNTER — Other Ambulatory Visit: Payer: Self-pay

## 2021-12-09 ENCOUNTER — Encounter: Payer: Self-pay | Admitting: Internal Medicine

## 2021-12-09 VITALS — BP 131/83 | HR 56 | Ht 64.0 in | Wt 147.0 lb

## 2021-12-09 DIAGNOSIS — H02831 Dermatochalasis of right upper eyelid: Secondary | ICD-10-CM

## 2021-12-09 DIAGNOSIS — H02834 Dermatochalasis of left upper eyelid: Secondary | ICD-10-CM

## 2021-12-09 MED ORDER — OMEPRAZOLE 20 MG PO CPDR
20.0000 mg | DELAYED_RELEASE_CAPSULE | Freq: Every morning | ORAL | 1 refills | Status: DC
  Filled 2021-12-09: qty 90, 90d supply, fill #0

## 2021-12-09 MED ORDER — METOPROLOL SUCCINATE ER 25 MG PO TB24
25.0000 mg | ORAL_TABLET | Freq: Every day | ORAL | 1 refills | Status: DC
  Filled 2021-12-09: qty 90, 90d supply, fill #0
  Filled 2022-03-10: qty 90, 90d supply, fill #1

## 2021-12-10 NOTE — Therapy (Signed)
OUTPATIENT PHYSICAL THERAPY TREATMENT NOTE   Patient Name: Kara Warren MRN: [id number] DOB:[birth year], 66 y.o., female Today's Date: 12/12/2021  PCP: Isaac Bliss, Rayford Halsted, MD REFERRING PROVIDER: Isaac Bliss, Estel*   PT End of Session - 12/12/21 1046     Visit Number 5    Number of Visits 10    Date for PT Re-Evaluation 01/23/22    Authorization Type [employer] MEDICARE    Progress Note Due on Visit 10    PT Start Time 1045    PT Stop Time 1130    PT Time Calculation (min) 45 min    Activity Tolerance Patient tolerated treatment well    Behavior During Therapy [facility] for tasks assessed/performed                Past Medical History:  Diagnosis Date   Abdominal pain, left upper quadrant    Acute bronchospasm    Acute neck pain 07/02/2019   Acute sinusitis, unspecified 05/20/2009   Centricity Description: SINUSITIS - ACUTE-NOS Qualifier: Diagnosis of  By: Birdie Riddle MD, Belenda Cruise   Centricity Description: ACUTE SINUSITIS, UNSPECIFIED Qualifier: Diagnosis of  By: Alveta Heimlich MD, Eugene   Centricity Description: SINUSITIS, ACUTE Qualifier: Diagnosis of  By: Koleen Nimrod MD, Dellis Filbert     Atypical chest pain 03/05/2013   Body mass index (BMI) of 23.0-23.9 in adult    Cervicalgia    Colon polyp    Costochondral junction syndrome    Cough    DEPRESSION 08/25/2008   Qualifier: Diagnosis of  By: Ronnald Ramp CMA, Chemira     Diarrhea    Diverticulosis 07/27/2012   Dorsalgia    Elevated blood pressure reading without diagnosis of hypertension    Endometrial polyp 8/08   benign   Essential hypertension 09/20/2020   Family history of osteoporosis 12/28/2011   Mother pt had normal Dexa 11/2009    Fatigue    GERD (gastroesophageal reflux disease)    HIP PAIN, RIGHT 04/12/2009   Qualifier: Diagnosis of  By: Oneida Alar MD, KARL     History of hiatal hernia    HYPERLIPIDEMIA 08/25/2008   Qualifier: Diagnosis of  By: Birdie Riddle MD, Belenda Cruise     IBS (irritable bowel syndrome)    Left hip pain  07/30/2013   Left wrist pain 07/30/2013   Lumbar strain, initial encounter 07/28/2019   LUNG NODULE 11/19/2009   Neg CT     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 Please review for accuracy.   Otalgia, unspecified ear    Pain in throat    Palpitations    Paroxysmal atrial fibrillation (HCC)    Peroneal tendinitis of right lower extremity 03/12/2014   Plantar wart 04/02/2014   Pneumonia    RHINITIS 06/21/2009   Qualifier: Diagnosis of  By: Birdie Riddle MD, Belenda Cruise     Right ankle injury, subsequent encounter 01/11/2018   Right foot pain 08/09/2011   Sinusitis, chronic    Skene's gland abscess 2009   patient unaware   Sprain of unspecified ligament of right ankle, subsequent encounter  TRANSAMINASES, SERUM, ELEVATED 12/07/2009   Qualifier: Diagnosis of  By: Inda Castle FNP, Melissa S    Unspecified cataract    Urinary tract infection, site not specified    Vitamin D deficiency    Past Surgical History:  Procedure Laterality Date   COLONOSCOPY     DILATION AND CURETTAGE OF UTERUS  8/08   GYNECOLOGIC CRYOSURGERY  1980   HYSTEROSCOPY WITH D & C N/A 03/30/2017   Procedure: DILATATION & CURETTAGE/HYSTEROSCOPY WITH ULTRASOUND GUIDANCE;  Surgeon: Megan Salon, MD;  Location: [facility];  Service: Gynecology;  Laterality: N/A;   UPPER GI ENDOSCOPY     uterine ablation  2007   Patient Active Problem List   Diagnosis Date Noted   Secondary hypercoagulable state ([place]) 09/26/2021   Colonoscopy causing post-procedural bleeding 09/20/2020   Essential hypertension  09/20/2020   AF (paroxysmal atrial fibrillation) ([place]) 09/20/2020   Plantar wart 04/02/2014   Peroneal tendinitis of right lower extremity 03/12/2014   Diverticulosis 07/27/2012   Sinusitis, chronic    Colon polyp    Menopause    Migraine    NONSPCIFC ABN FINDING RAD & OTH EXAM LUNG FIELD 11/19/2009   HIP PAIN, RIGHT 04/12/2009   Hyperlipidemia 08/25/2008   Depression 08/25/2008    REFERRING DIAG: Neck pain  THERAPY DIAG:  Cervicalgia  Muscle weakness (generalized)  SUBJECTIVE: Patient reports she is doing well. She is not having any pain but is having a little tightness.   PAIN:  Are you having pain? Yes NPRS scale: 2-3/10 Pain location: Neck, upper back Pain orientation: Bilateral PAIN TYPE: Chronic Pain description: Constant, tightness Aggravating factors: Nothing specific Relieving factors: OTC meds, heat, stretch, massage  PATIENT GOALS: Improve feeling of neck/upper back tightness   OBJECTIVE:  PATIENT SURVEYS:  FOTO 72% functional status (predicted 72%)  POSTURE:  Slightly rounded shoulder and forward head posture  PALPATION: TTP bilateral upper trap, cervical paraspinals   CERVICAL AROM  AROM AROM (deg) 11/28/2021   12/12/2021  Flexion 50   Extension 40   Right lateral flexion 25 30  Left lateral flexion 20 30  Right rotation 45 50  Left rotation 50 50   Thoracic AROM grossly limited with feeling of tightness primarily in rotation and extension  UE AROM/PROM:   UE AROM grossly WFL and non-painful   UE MMT:  MMT Right 11/28/2021 Left 11/28/2021  Shoulder flexion 5 5  Shoulder extension 4+ 4+  Shoulder abduction 5 5  Shoulder internal rotation 5 5  Shoulder external rotation 5 5  Middle trapezius 4 4-  Lower trapezius 4- 4-   FUNCTIONAL TESTS:  DNF endurance: 17 seconds   TODAY'S TREATMENT:   12/12/2021:  Therapeutic Exercise: UBE L1 x 4 min (2 fwd/bwd) Seated upper trap stretch 2 x 20 sec each Supine cervical retraction with  small towel roll under neck x 10 with 5 sec hold Row with blue 2 x 10 Standing thoracic extension stretch at counter 5 x 5 sec 1/2 kneeling thoracic rotation at wall x 5 each (towards and away) 1/2 kneeing arrow pull at wall with green x 10 each Manual Therapy: Skilled palpation and monitoring of muscle tension while performing TPDN STM bilaterally upper trap regions Suboccipital release with gentle manual traction x 3 bouts Trigger Point Dry Needling Treatment: Pre-treatment instruction: Patient instructed on dry needling rationale, procedures, and possible side effects including pain during treatment (achy,cramping feeling), bruising, drop of blood, lightheadedness, nausea, sweating. Patient Consent Given: Yes Education handout provided: No Muscles treated: bilateral  upper traps  Needle size and number: .30x35m x 4 Electrical stimulation performed: No Parameters: N/A Treatment response/outcome: Twitch response elicited, Palpable decrease in muscle tension, and patient report reduced tightness. Post-treatment instructions: Patient instructed to expect possible mild to moderate muscle soreness later today and/or tomorrow. Patient instructed in methods to reduce muscle soreness and to continue prescribed HEP. If patient was dry needled over the lung field, patient was instructed on signs and symptoms of pneumothorax and, however unlikely, to see immediate medical attention should they occur. Patient was also educated on signs and symptoms of infection and to seek medical attention should they occur. Patient verbalized understanding of these instructions and education.   12/07/2021:  Therapeutic Exercise: UBE L1 x 4 min (2 fwd/bwd) Sidelying thoracic rotation x 10 each Supine thoracic extension over FR at various levels Child's pose thoracic extension stretch with FR 5 x 5 sec Quadruped thread the needle with FM x 5 each Cat cow x 5 Supine cervical retraction with small towel roll under  neck 2 x 10 with 5 sec hold Supine horizontal abduction with green 2 x 10 Double er and scap retraction with green x 15 1/2 kneeing arrow pull at wall with green x 10 each 1/2 kneeling thoracic rotation at wall x 5 each (towards and away)  12/05/2021:  Therapeutic Exercise: UBE L1 x 4 min (2 fwd/bwd) Sidelying thoracic rotation 5 x 5 sec hold Child's pose x 20 sec forward, x 20 sec each side Cat cow x 5 Supine thoracic extension over FR at various levels Machine low row 25# 2 x 15 Double er and scap retraction with green 2 x 15  Manual Therapy: Skilled palpation and monitoring of muscle tension while performing TPDN STM bilaterally upper trap regions Suboccipital release with gentle manual traction x 3 bouts Passive upper trap and levator stretch  Trigger Point Dry Needling Treatment: Pre-treatment instruction: Patient instructed on dry needling rationale, procedures, and possible side effects including pain during treatment (achy,cramping feeling), bruising, drop of blood, lightheadedness, nausea, sweating. Patient Consent Given: Yes Education handout provided: No Muscles treated: bilateral upper traps  Needle size and number: .30x552mx 4 Electrical stimulation performed: No Parameters: N/A Treatment response/outcome: Twitch response elicited, Palpable decrease in muscle tension, and patient report reduced tightness. Post-treatment instructions: Patient instructed to expect possible mild to moderate muscle soreness later today and/or tomorrow. Patient instructed in methods to reduce muscle soreness and to continue prescribed HEP. If patient was dry needled over the lung field, patient was instructed on signs and symptoms of pneumothorax and, however unlikely, to see immediate medical attention should they occur. Patient was also educated on signs and symptoms of infection and to seek medical attention should they occur. Patient verbalized understanding of these instructions and  education.  PATIENT EDUCATION:  Education details: HEP Person educated: Patient Education method: ExEducation officer, environmentalTaCorporate treasurerues, and Verbal cues Education comprehension: verbalized understanding, returned demonstration, verbal cues required, tactile cues required, and needs further education  HOME EXERCISE PROGRAM: Access Code: [email or web address] ASSESSMENT: CLINICAL IMPRESSION: Patient tolerated therapy well with no adverse effects. Performed TPDN this visit due to patient reporting cervical tightness, elicited strong twitch responses and patient with reduced tightness. Therapy continues to focus on cervical and thoracic mobility with postural control and endurance. She notes continued improvement with therapy and is demonstrating improved motion. No changes to HEP this visit. Patient would benefit from continued skilled PT to progress mobility and strength in order to reduce pain and maximize functional  ability.   OBJECTIVE IMPAIRMENTS decreased activity tolerance, decreased ROM, decreased strength, postural dysfunction, and pain.   ACTIVITY LIMITATIONS cleaning, community activity, meal prep, laundry, yard work, and shopping.   PERSONAL FACTORS Past/current experiences and Time since onset of injury/illness/exacerbation are also affecting patient's functional outcome.    GOALS: Goals reviewed with patient? Yes  SHORT TERM GOALS:  STG Name Target Date Goal status  1 Patient will be I with initial HEP in order to progress with therapy. Baseline: provided at evaluation 01/09/2022 INITIAL  2 PT will review FOTO with patient by 3rd visit in order to understand expected progress and outcome with therapy. Baseline: assessed at evaluation 12/05/2021: reviewed 01/09/2022 MET  3 Patient will report </= 2/10 pain level in order to reduce functional limitations. Baseline: 4/10 resting pain at eval 01/09/2022 INITIAL   LONG TERM GOALS:   LTG Name Target Date Goal status  1 Patient will  be I with final HEP to maintain progress from PT. Baseline: provided at evaluation     02/06/2022 INITIAL  2 Patient will report > 72% status on FOTO to indicate improved functional ability. Baseline: 72% at intake with prediction of 72% 02/06/2022 INITIAL  3 Patient will demonstrate periscapular strength grossly >/= 4/5 MMT in order to improve postural control and reduce neck/upper back pain with activity Baseline: grossly 4-/5 MMT 02/06/2022 INITIAL  4 Patient will exhibit DNF endurance >/= 30 seconds to improve postural control and reduce neck tightness with pilates Baseline: 17 seconds 02/06/2022 INITIAL  5 Patient will report </= 1/10 pain level with all activity in order to return to exercising without limitation Baseline: patient report > 4/10 pain with activity 02/06/2022 INITIAL    PLAN: PT FREQUENCY: 1-2x/week  PT DURATION: 8 weeks  PLANNED INTERVENTIONS: Therapeutic exercises, Therapeutic activity, Neuro Muscular re-education, Balance training, Gait training, Patient/Family education, Joint mobilization, Aquatic Therapy, Dry Needling, Electrical stimulation, Spinal mobilization, Cryotherapy, Moist heat, Taping, and Manual therapy  PLAN FOR NEXT SESSION: Review HEP and progress PRN, manual/dry needling for upper traps/cervical paraspinals/left pec or lat, thoracic mobility, initiate DNF endurance and periscapular strengthening   Hilda Blades, PT, DPT, LAT, ATC 12/12/21  11:30 AM Phone: [phone] Fax: [phone]

## 2021-12-11 MED ORDER — ONDANSETRON 4 MG PO TBDP
4.0000 mg | ORAL_TABLET | Freq: Three times a day (TID) | ORAL | 0 refills | Status: DC | PRN
  Filled 2021-12-11: qty 20, 7d supply, fill #0

## 2021-12-11 MED ORDER — OXYCODONE HCL 5 MG PO TABS
5.0000 mg | ORAL_TABLET | ORAL | 0 refills | Status: AC | PRN
  Filled 2021-12-11: qty 28, 5d supply, fill #0

## 2021-12-11 NOTE — Progress Notes (Signed)
Patient ID: Kara Warren, female    DOB: 12-19-1955, 66 y.o.   MRN: [id number]  Chief Complaint  Patient presents with   Pre-op Exam    No diagnosis found.   History of Present Illness: Kara Warren is a 66 y.o.  female  with a history of dermatochalasis.  She presents for preoperative evaluation for upcoming procedure, upper blepharoplasty, scheduled for 12/29/2021 with Dr.  Erin Hearing .  The patient has not had problems with anesthesia.    PMH Significant for: Atrial fibrillation.   Past Medical History: Allergies: Allergies  Allergen Reactions   Augmentin [Amoxicillin-Pot Clavulanate] Diarrhea    Current Medications:  Current Outpatient Medications:    Ascorbic Acid (VITAMIN C) 1000 MG tablet, Take 500 mg by mouth 3 (three) times a week., Disp: , Rfl:    aspirin EC 81 MG tablet, Take 81 mg by mouth daily. Swallow whole., Disp: , Rfl:    azelastine (ASTELIN) 0.1 % nasal spray, Place 1 spray into both nostrils daily in the afternoon. Place 1 spray into both nostrils daily as needed for allergies., Disp: , Rfl:    citalopram (CELEXA) 20 MG tablet, Take 1.5 tablets (30 mg total) by mouth daily., Disp: 145 tablet, Rfl: 0   diltiazem (CARDIZEM) 30 MG tablet, TAKE 1 TABLET BY MOUTH 2 TIMES A DAY IF NEEDED FOR AFIB, Disp: 45 tablet, Rfl: 1   famotidine (PEPCID) 10 MG tablet, Take 1 tablet (10 mg total) by mouth 2 (two) times daily., Disp: 90 tablet, Rfl: 0   fluticasone (FLONASE) 50 MCG/ACT nasal spray, Place 1-2 sprays into both nostrils daily., Disp: 16 g, Rfl: 3   metoprolol succinate (TOPROL-XL) 25 MG 24 hr tablet, Take 1 tablet (25 mg total) by mouth daily., Disp: 90 tablet, Rfl: 1   Multiple Vitamins-Minerals (MULTI FOR HER 50+) TABS, Take 1 tablet by mouth daily., Disp: , Rfl:    Omega-3 Fatty Acids (FISH OIL) 1000 MG CAPS, Take 1,000 mg by mouth daily., Disp: , Rfl:    Probiotic Product (PROBIOTIC PO), Take 1 capsule by mouth daily. Called ultra spectrum, Disp: ,  Rfl:   Past Medical Problems: Past Medical History:  Diagnosis Date   Abdominal pain, left upper quadrant    Acute bronchospasm    Acute neck pain 07/02/2019   Acute sinusitis, unspecified 05/20/2009   Centricity Description: SINUSITIS - ACUTE-NOS Qualifier: Diagnosis of  By: Birdie Riddle MD, Belenda Cruise   Centricity Description: ACUTE SINUSITIS, UNSPECIFIED Qualifier: Diagnosis of  By: Alveta Heimlich MD, Cornelia Copa   Centricity Description: SINUSITIS, ACUTE Qualifier: Diagnosis of  By: Koleen Nimrod MD, Dellis Filbert     Atypical chest pain 03/05/2013   Body mass index (BMI) of 23.0-23.9 in adult    Cervicalgia    Colon polyp    Costochondral junction syndrome    Cough    DEPRESSION 08/25/2008   Qualifier: Diagnosis of  By: Ronnald Ramp CMA, Chemira     Diarrhea    Diverticulosis 07/27/2012   Dorsalgia    Elevated blood pressure reading without diagnosis of hypertension    Endometrial polyp 8/08   benign   Essential hypertension 09/20/2020   Family history of osteoporosis 12/28/2011   Mother pt had normal Dexa 11/2009    Fatigue    GERD (gastroesophageal reflux disease)    HIP PAIN, RIGHT 04/12/2009   Qualifier: Diagnosis of  By: Oneida Alar MD, KARL     History of hiatal hernia    HYPERLIPIDEMIA 08/25/2008   Qualifier: Diagnosis of  By:  Birdie Riddle MD, Belenda Cruise     IBS (irritable bowel syndrome)    Left hip pain 07/30/2013   Left wrist pain 07/30/2013   Lumbar strain, initial encounter 07/28/2019   LUNG NODULE 11/19/2009   Neg CT     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 Please review for  accuracy.   Otalgia, unspecified ear    Pain in throat    Palpitations    Paroxysmal atrial fibrillation (HCC)    Peroneal tendinitis of right lower extremity 03/12/2014   Plantar wart 04/02/2014   Pneumonia    RHINITIS 06/21/2009   Qualifier: Diagnosis of  By: Birdie Riddle MD, Belenda Cruise     Right ankle injury, subsequent encounter 01/11/2018   Right foot pain 08/09/2011   Sinusitis, chronic    Skene's gland abscess 2009   patient unaware   Sprain of unspecified ligament of right ankle, subsequent encounter    TRANSAMINASES, SERUM, ELEVATED 12/07/2009   Qualifier: Diagnosis of  By: Inda Castle FNP, Melissa S    Unspecified cataract    Urinary tract infection, site not specified    Vitamin D deficiency     Past Surgical History: Past Surgical History:  Procedure Laterality Date   COLONOSCOPY     DILATION AND CURETTAGE OF UTERUS  8/08   GYNECOLOGIC CRYOSURGERY  1980   HYSTEROSCOPY WITH D & C N/A 03/30/2017   Procedure: DILATATION & CURETTAGE/HYSTEROSCOPY WITH ULTRASOUND GUIDANCE;  Surgeon: Megan Salon, MD;  Location: [facility];  Service: Gynecology;  Laterality: N/A;   UPPER GI ENDOSCOPY     uterine ablation  2007    Social History: Social History   Socioeconomic History   Marital status: Single    Spouse name: Not on file   Number of children: 0   Years of education: Not on file   Highest education level: Not on file  Occupational History   Occupation: Therapist, sports    Employer: [employer]  Tobacco Use   Smoking status: Former    Packs/day: 0.10    Years: 3.00    Pack years: 0.30    Types: Cigarettes    Quit date: 10/02/1978    Years since quitting: 43.2   Smokeless tobacco: Never  Vaping Use   Vaping Use: Never used  Substance and Sexual Activity   Alcohol use: Yes    Alcohol/week: 7.0 standard drinks    Types: 7 Glasses of wine per week    Comment: per week   Drug use: No   Sexual activity: Not Currently    Birth control/protection: Post-menopausal  Other Topics Concern   Not  on file  Social History Narrative   Not on file   Social Determinants of Health   Financial Resource Strain: Not on file  Food Insecurity: Not on file  Transportation Needs: Not on file  Physical Activity: Not on file  Stress: Not on file  Social Connections: Not on file  Intimate Partner Violence: Not on file    Family History: Family History  Problem Relation Age of Onset  Colon polyps Father    Ulcerative colitis Father    CAD Father        Stent placement at age 17   Glaucoma Father    Hypertension Father    Colitis Father    Breast cancer Paternal Grandmother    Colon cancer Maternal Grandfather    Colon polyps Mother    Osteoporosis Mother    Thyroid disease Mother        hypo   Glaucoma Mother    Diverticulosis Mother    Sudden death Neg Hx    Hyperlipidemia Neg Hx    Heart attack Neg Hx    Diabetes Neg Hx     Review of Systems: ROS  Physical Exam: Vital Signs BP 131/83 (BP Location: Left Arm, Patient Position: Sitting, Cuff Size: Normal)    Pulse (!) 56    Ht '5\' 4"'$  (1.626 m)    Wt 147 lb (66.7 kg)    LMP 10/02/2006 (Approximate)    SpO2 99%    BMI 25.23 kg/m   Physical Exam  Constitutional:      General: Not in acute distress.    Appearance: Normal appearance. Not ill-appearing.  HENT:     Head: Normocephalic and atraumatic. Brow above the orbital rim, significant dermatochalasis with eyelid skin resting on the lashes.  MRD equals 3 Eyes:     Pupils: Pupils are equal, round Neck:     Musculoskeletal: Normal range of motion.  Cardiovascular:     Rate and Rhythm: Normal rate    Pulses: Normal pulses.  Pulmonary:     Effort: Pulmonary effort is normal. No respiratory distress.  Abdominal:     General: Abdomen is flat. There is no distension.  Musculoskeletal: Normal range of motion.  Skin:    General: Skin is warm and dry.     Findings: No erythema or rash.  Neurological:     General: No focal deficit present.     Mental Status: Alert and  oriented to person, place, and time. Mental status is at baseline.     Motor: No weakness.  Psychiatric:        Mood and Affect: Mood normal.        Behavior: Behavior normal.    Assessment/Plan: The patient is scheduled for bilateral upper blepharoplasty with Dr.  Erin Hearing .  Risks, benefits, and alternatives of procedure discussed, questions answered and consent obtained.    Smoking Status: Former; Counseling Given?  Yes   Caprini Score: 5; patient has a history of A-fib and her cardiologist has her rate controlled and on aspirin only based on her risk scoring.  We discussed that she may continue aspirin. Pictures obtained: Previous visit  Post-op Rx sent to pharmacy: Oxycodone and Zofran  Patient was provided with the General Surgical Risk consent document and Pain Medication Agreement prior to their appointment.  They had adequate time to read through the risk consent documents and Pain Medication Agreement. We also discussed them in person together during this preop appointment. All of their questions were answered to their satisfaction.  Recommended calling if they have any further questions.  Risk consent form and Pain Medication Agreement to be scanned into patient's chart.     Electronically signed by: Lennice Sites, MD 12/11/2021 1:23 PM

## 2021-12-11 NOTE — H&P (View-Only) (Signed)
? ?  Patient ID: Kara Warren, female    DOB: [birth year], 66 y.o.   MRN: [id number] ? ?Chief Complaint  ?Patient presents with  ? Pre-op Exam  ? ? ?No diagnosis found. ? ? ?History of Present Illness: ?Kara Warren is a 66 y.o.  female  with a history of dermatochalasis.  She presents for preoperative evaluation for upcoming procedure, upper blepharoplasty, scheduled for 12/29/2021 with Dr.  Erin Hearing . ? ?The patient has not had problems with anesthesia.  ? ? ?PMH Significant for: Atrial fibrillation. ? ? ?Past Medical History: ?Allergies: ?Allergies  ?Allergen Reactions  ? Augmentin [Amoxicillin-Pot Clavulanate] Diarrhea  ? ? ?Current Medications: ? ?Current Outpatient Medications:  ?  Ascorbic Acid (VITAMIN C) 1000 MG tablet, Take 500 mg by mouth 3 (three) times a week., Disp: , Rfl:  ?  aspirin EC 81 MG tablet, Take 81 mg by mouth daily. Swallow whole., Disp: , Rfl:  ?  azelastine (ASTELIN) 0.1 % nasal spray, Place 1 spray into both nostrils daily in the afternoon. Place 1 spray into both nostrils daily as needed for allergies., Disp: , Rfl:  ?  citalopram (CELEXA) 20 MG tablet, Take 1.5 tablets (30 mg total) by mouth daily., Disp: 145 tablet, Rfl: 0 ?  diltiazem (CARDIZEM) 30 MG tablet, TAKE 1 TABLET BY MOUTH 2 TIMES A DAY IF NEEDED FOR AFIB, Disp: 45 tablet, Rfl: 1 ?  famotidine (PEPCID) 10 MG tablet, Take 1 tablet (10 mg total) by mouth 2 (two) times daily., Disp: 90 tablet, Rfl: 0 ?  fluticasone (FLONASE) 50 MCG/ACT nasal spray, Place 1-2 sprays into both nostrils daily., Disp: 16 g, Rfl: 3 ?  metoprolol succinate (TOPROL-XL) 25 MG 24 hr tablet, Take 1 tablet (25 mg total) by mouth daily., Disp: 90 tablet, Rfl: 1 ?  Multiple Vitamins-Minerals (MULTI FOR HER 50+) TABS, Take 1 tablet by mouth daily., Disp: , Rfl:  ?  Omega-3 Fatty Acids (FISH OIL) 1000 MG CAPS, Take 1,000 mg by mouth daily., Disp: , Rfl:  ?  Probiotic Product (PROBIOTIC PO), Take 1 capsule by mouth daily. Called ultra spectrum, Disp: ,  Rfl:  ? ?Past Medical Problems: ?Past Medical History:  ?Diagnosis Date  ? Abdominal pain, left upper quadrant   ? Acute bronchospasm   ? Acute neck pain 07/02/2019  ? Acute sinusitis, unspecified 05/20/2009  ? Centricity Description: SINUSITIS - ACUTE-NOS Qualifier: Diagnosis of  By: Birdie Riddle MD, Belenda Cruise   Centricity Description: ACUTE SINUSITIS, UNSPECIFIED Qualifier: Diagnosis of  By: Alveta Heimlich MD, Cornelia Copa   Centricity Description: SINUSITIS, ACUTE Qualifier: Diagnosis of  By: Koleen Nimrod MD, Dellis Filbert    ? Atypical chest pain 03/05/2013  ? Body mass index (BMI) of 23.0-23.9 in adult   ? Cervicalgia   ? Colon polyp   ? Costochondral junction syndrome   ? Cough   ? DEPRESSION 08/25/2008  ? Qualifier: Diagnosis of  By: Ronnald Ramp CMA, Chemira    ? Diarrhea   ? Diverticulosis 07/27/2012  ? Dorsalgia   ? Elevated blood pressure reading without diagnosis of hypertension   ? Endometrial polyp 8/08  ? benign  ? Essential hypertension 09/20/2020  ? Family history of osteoporosis 12/28/2011  ? Mother pt had normal Dexa 11/2009   ? Fatigue   ? GERD (gastroesophageal reflux disease)   ? HIP PAIN, RIGHT 04/12/2009  ? Qualifier: Diagnosis of  By: Oneida Alar MD, KARL    ? History of hiatal hernia   ? HYPERLIPIDEMIA 08/25/2008  ? Qualifier: Diagnosis of  By:  Birdie Riddle MD, Belenda Cruise    ? IBS (irritable bowel syndrome)   ? Left hip pain 07/30/2013  ? Left wrist pain 07/30/2013  ? Lumbar strain, initial encounter 07/28/2019  ? LUNG NODULE 11/19/2009  ? Neg CT    ? 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 Menopause   ? Migraine   ? NEUTROPENIA UNSPECIFIED 10/07/2008  ? Qualifier: Diagnosis of  By: Birdie Riddle MD, Belenda Cruise    ? [place] LUNG FIELD 11/19/2009  ? Qualifier: Diagnosis of  By: Alveta Heimlich MD, Cornelia Copa    ? Nonspecific (abnormal) findings on radiological and other examination of body structure 11/19/2009  ? Qualifier: Diagnosis of By: Alveta Heimlich MD, Bennie Dallas list entry automatically replaced. Please review for  accuracy.  ? Otalgia, unspecified ear   ? Pain in throat   ? Palpitations   ? Paroxysmal atrial fibrillation (HCC)   ? Peroneal tendinitis of right lower extremity 03/12/2014  ? Plantar wart 04/02/2014  ? Pneumonia   ? RHINITIS 06/21/2009  ? Qualifier: Diagnosis of  By: Birdie Riddle MD, Belenda Cruise    ? Right ankle injury, subsequent encounter 01/11/2018  ? Right foot pain 08/09/2011  ? Sinusitis, chronic   ? Skene's gland abscess 2009  ? patient unaware  ? Sprain of unspecified ligament of right ankle, subsequent encounter   ? TRANSAMINASES, SERUM, ELEVATED 12/07/2009  ? Qualifier: Diagnosis of  By: Inda Castle FNP, Wellington Hampshire   ? Unspecified cataract   ? Urinary tract infection, site not specified   ? Vitamin D deficiency   ? ? ?Past Surgical History: ?Past Surgical History:  ?Procedure Laterality Date  ? COLONOSCOPY    ? DILATION AND CURETTAGE OF UTERUS  8/08  ? [place]  ? HYSTEROSCOPY WITH D & C N/A 03/30/2017  ? Procedure: DILATATION & CURETTAGE/HYSTEROSCOPY WITH ULTRASOUND GUIDANCE;  Surgeon: Megan Salon, MD;  Location: [facility];  Service: Gynecology;  Laterality: N/A;  ? UPPER GI ENDOSCOPY    ? uterine ablation  2007  ? ? ?Social History: ?Social History  ? ?Socioeconomic History  ? Marital status: Single  ?  Spouse name: Not on file  ? Number of children: 0  ? Years of education: Not on file  ? Highest education level: Not on file  ?Occupational History  ? Occupation: Therapist, sports  ?  Employer: [employer]  ?Tobacco Use  ? Smoking status: Former  ?  Packs/day: 0.10  ?  Years: 3.00  ?  Pack years: 0.30  ?  Types: Cigarettes  ?  Quit date: 10/02/1978  ?  Years since quitting: 43.2  ? Smokeless tobacco: Never  ?Vaping Use  ? Vaping Use: Never used  ?Substance and Sexual Activity  ? Alcohol use: Yes  ?  Alcohol/week: 7.0 standard drinks  ?  Types: 7 Glasses of wine per week  ?  Comment: per week  ? Drug use: No  ? Sexual activity: Not Currently  ?  Birth control/protection: Post-menopausal  ?Other Topics Concern  ? Not  on file  ?Social History Narrative  ? Not on file  ? ?Social Determinants of Health  ? ?Financial Resource Strain: Not on file  ?Food Insecurity: Not on file  ?Transportation Needs: Not on file  ?Physical Activity: Not on file  ?Stress: Not on file  ?Social Connections: Not on file  ?Intimate Partner Violence: Not on file  ? ? ?Family History: ?Family History  ?Problem Relation Age of Onset  ?  Colon polyps Father   ? Ulcerative colitis Father   ? CAD Father   ?     Stent placement at age 36  ? Glaucoma Father   ? Hypertension Father   ? Colitis Father   ? Breast cancer Paternal Grandmother   ? Colon cancer Maternal Grandfather   ? Colon polyps Mother   ? Osteoporosis Mother   ? Thyroid disease Mother   ?     hypo  ? Glaucoma Mother   ? Diverticulosis Mother   ? Sudden death Neg Hx   ? Hyperlipidemia Neg Hx   ? Heart attack Neg Hx   ? Diabetes Neg Hx   ? ? ?Review of Systems: ?ROS ? ?Physical Exam: ?Vital Signs ?BP 131/83 (BP Location: Left Arm, Patient Position: Sitting, Cuff Size: Normal)   Pulse (!) 56   Ht '5\' 4"'$  (1.626 m)   Wt 147 lb (66.7 kg)   LMP 10/02/2006 (Approximate)   SpO2 99%   BMI 25.23 kg/m?  ? ?Physical Exam  ?Constitutional:   ?   General: Not in acute distress. ?   Appearance: Normal appearance. Not ill-appearing.  ?HENT:  ?   Head: Normocephalic and atraumatic. Brow above the orbital rim, significant dermatochalasis with eyelid skin resting on the lashes.  MRD equals 3 ?Eyes:  ?   Pupils: Pupils are equal, round ?Neck:  ?   Musculoskeletal: Normal range of motion.  ?Cardiovascular:  ?   Rate and Rhythm: Normal rate ?   Pulses: Normal pulses.  ?Pulmonary:  ?   Effort: Pulmonary effort is normal. No respiratory distress.  ?Abdominal:  ?   General: Abdomen is flat. There is no distension.  ?Musculoskeletal: Normal range of motion.  ?Skin: ?   General: Skin is warm and dry.  ?   Findings: No erythema or rash.  ?Neurological:  ?   General: No focal deficit present.  ?   Mental Status: Alert and  oriented to person, place, and time. Mental status is at baseline.  ?   Motor: No weakness.  ?Psychiatric:     ?   Mood and Affect: Mood normal.     ?   Behavior: Behavior normal.  ? ? ?Assessment/Plan: ?The patien

## 2021-12-12 ENCOUNTER — Other Ambulatory Visit (HOSPITAL_COMMUNITY): Payer: Self-pay

## 2021-12-12 ENCOUNTER — Other Ambulatory Visit: Payer: Self-pay | Admitting: Internal Medicine

## 2021-12-12 ENCOUNTER — Encounter: Payer: Self-pay | Admitting: Physical Therapy

## 2021-12-12 ENCOUNTER — Ambulatory Visit: Payer: Medicare HMO | Admitting: Physical Therapy

## 2021-12-12 ENCOUNTER — Other Ambulatory Visit: Payer: Self-pay

## 2021-12-12 DIAGNOSIS — M6281 Muscle weakness (generalized): Secondary | ICD-10-CM

## 2021-12-12 DIAGNOSIS — M542 Cervicalgia: Secondary | ICD-10-CM

## 2021-12-12 DIAGNOSIS — M62830 Muscle spasm of back: Secondary | ICD-10-CM | POA: Diagnosis not present

## 2021-12-12 MED ORDER — OMEPRAZOLE 20 MG PO CPDR
20.0000 mg | DELAYED_RELEASE_CAPSULE | Freq: Every morning | ORAL | 0 refills | Status: DC
  Filled 2021-12-12: qty 90, 90d supply, fill #0

## 2021-12-13 ENCOUNTER — Encounter: Payer: Self-pay | Admitting: Cardiovascular Disease

## 2021-12-13 NOTE — Therapy (Signed)
?OUTPATIENT PHYSICAL THERAPY TREATMENT NOTE ? ? ?Patient Name: Kara Warren ?MRN: [id number] ?DOB:[birth year], 66 y.o., female ?Today's Date: 12/15/2021 ? ?PCP: Isaac Bliss, Rayford Halsted, MD ?REFERRING PROVIDER: Dene Gentry, MD ? ? PT End of Session - 12/15/21 1138   ? ? Visit Number 6   ? Number of Visits 10   ? Date for PT Re-Evaluation 01/23/22   ? Authorization Type AETNA MEDICARE   ? Progress Note Due on Visit 10   ? PT Start Time 1130   ? PT Stop Time 1210   ? PT Time Calculation (min) 40 min   ? Activity Tolerance Patient tolerated treatment well   ? Behavior During Therapy [facility] for tasks assessed/performed   ? ?  ?  ? ?  ? ? ? ? ? ? ?Past Medical History:  ?Diagnosis Date  ? Abdominal pain, left upper quadrant   ? Acute bronchospasm   ? Acute neck pain 07/02/2019  ? Acute sinusitis, unspecified 05/20/2009  ? Centricity Description: SINUSITIS - ACUTE-NOS Qualifier: Diagnosis of  By: Birdie Riddle MD, Belenda Cruise   Centricity Description: ACUTE SINUSITIS, UNSPECIFIED Qualifier: Diagnosis of  By: Alveta Heimlich MD, Cornelia Copa   Centricity Description: SINUSITIS, ACUTE Qualifier: Diagnosis of  By: Koleen Nimrod MD, Dellis Filbert    ? Atypical chest pain 03/05/2013  ? Body mass index (BMI) of 23.0-23.9 in adult   ? Cervicalgia   ? Colon polyp   ? Costochondral junction syndrome   ? Cough   ? DEPRESSION 08/25/2008  ? Qualifier: Diagnosis of  By: Ronnald Ramp CMA, Chemira    ? Diarrhea   ? Diverticulosis 07/27/2012  ? Dorsalgia   ? Elevated blood pressure reading without diagnosis of hypertension   ? Endometrial polyp 8/08  ? benign  ? Essential hypertension 09/20/2020  ? Family history of osteoporosis 12/28/2011  ? Mother pt had normal Dexa 11/2009   ? Fatigue   ? GERD (gastroesophageal reflux disease)   ? HIP PAIN, RIGHT 04/12/2009  ? Qualifier: Diagnosis of  By: Oneida Alar MD, KARL    ? History of hiatal hernia   ? HYPERLIPIDEMIA 08/25/2008  ? Qualifier: Diagnosis of  By: Birdie Riddle MD, Belenda Cruise    ? IBS (irritable bowel syndrome)   ? Left hip pain  07/30/2013  ? Left wrist pain 07/30/2013  ? Lumbar strain, initial encounter 07/28/2019  ? LUNG NODULE 11/19/2009  ? Neg CT    ? 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 Menopause   ? Migraine   ? NEUTROPENIA UNSPECIFIED 10/07/2008  ? Qualifier: Diagnosis of  By: Birdie Riddle MD, Belenda Cruise    ? [place] LUNG FIELD 11/19/2009  ? Qualifier: Diagnosis of  By: Alveta Heimlich MD, Cornelia Copa    ? Nonspecific (abnormal) findings on radiological and other examination of body structure 11/19/2009  ? Qualifier: Diagnosis of By: Alveta Heimlich MD, Bennie Dallas list entry automatically replaced. Please review for accuracy.  ? Otalgia, unspecified ear   ? Pain in throat   ? Palpitations   ? Paroxysmal atrial fibrillation (HCC)   ? Peroneal tendinitis of right lower extremity 03/12/2014  ? Plantar wart 04/02/2014  ? Pneumonia   ? RHINITIS 06/21/2009  ? Qualifier: Diagnosis of  By: Birdie Riddle MD, Belenda Cruise    ? Right ankle injury, subsequent encounter 01/11/2018  ? Right foot pain 08/09/2011  ? Sinusitis, chronic   ? Skene's gland abscess 2009  ? patient unaware  ? Sprain of unspecified ligament of right ankle, subsequent encounter   ?  TRANSAMINASES, SERUM, ELEVATED 12/07/2009  ? Qualifier: Diagnosis of  By: Inda Castle FNP, Wellington Hampshire   ? Unspecified cataract   ? Urinary tract infection, site not specified   ? Vitamin D deficiency   ? ?Past Surgical History:  ?Procedure Laterality Date  ? COLONOSCOPY    ? DILATION AND CURETTAGE OF UTERUS  8/08  ? [place]  ? HYSTEROSCOPY WITH D & C N/A 03/30/2017  ? Procedure: DILATATION & CURETTAGE/HYSTEROSCOPY WITH ULTRASOUND GUIDANCE;  Surgeon: Megan Salon, MD;  Location: [facility];  Service: Gynecology;  Laterality: N/A;  ? UPPER GI ENDOSCOPY    ? uterine ablation  2007  ? ?Patient Active Problem List  ? Diagnosis Date Noted  ? Secondary hypercoagulable state ([place]) 09/26/2021  ? Colonoscopy causing post-procedural bleeding 09/20/2020  ? Essential hypertension  09/20/2020  ? AF (paroxysmal atrial fibrillation) ([place]) 09/20/2020  ? Plantar wart 04/02/2014  ? Peroneal tendinitis of right lower extremity 03/12/2014  ? Diverticulosis 07/27/2012  ? Sinusitis, chronic   ? Colon polyp   ? Menopause   ? Migraine   ? [place] LUNG FIELD 11/19/2009  ? HIP PAIN, RIGHT 04/12/2009  ? Hyperlipidemia 08/25/2008  ? Depression 08/25/2008  ? ? ?REFERRING DIAG: Neck pain ? ?THERAPY DIAG:  ?Cervicalgia ? ?Muscle weakness (generalized) ? ?Muscle spasm of back ? ?SUBJECTIVE: Patient reports she is doing well. She has started playing pickleball again and has developed some mid-lower back tightness. ? ?PAIN:  ?Are you having pain? Yes ?NPRS scale: 2/10 ?Pain location: Neck, upper-mid-lower back ?Pain orientation: Bilateral ?PAIN TYPE: Chronic ?Pain description: Constant, tightness ?Aggravating factors: Nothing specific ?Relieving factors: OTC meds, heat, stretch, massage ? ?PATIENT GOALS: Improve feeling of neck/upper back tightness ? ? ?OBJECTIVE:  ?PATIENT SURVEYS:  ?FOTO 72% functional status (predicted 72%) ? ?POSTURE:  ?Slightly rounded shoulder and forward head posture ? ?PALPATION: ?TTP bilateral upper trap, cervical paraspinals  ? ?CERVICAL AROM ? ?AROM AROM (deg) ?11/28/2021  ? ?12/12/2021  ?Flexion 50   ?Extension 40   ?Right lateral flexion 25 30  ?Left lateral flexion 20 30  ?Right rotation 45 50  ?Left rotation 50 50  ? ?Thoracic AROM grossly limited with feeling of tightness primarily in rotation and extension ? ?UE AROM/PROM: ?  UE AROM grossly WFL and non-painful  ? ?UE MMT: ? ?MMT Right ?11/28/2021 Left ?11/28/2021  ?Shoulder flexion 5 5  ?Shoulder extension 4+ 4+  ?Shoulder abduction 5 5  ?Shoulder internal rotation 5 5  ?Shoulder external rotation 5 5  ?Middle trapezius 4 4-  ?Lower trapezius 4- 4-  ? ?FUNCTIONAL TESTS:  ?DNF endurance: 17 seconds ? ? ?TODAY'S TREATMENT:  ? 12/15/2021: ? Therapeutic Exercise: ?UBE L1 x 4 min (2 fwd/bwd) ?Supine LTR  figure-4 x 5 each ?Sidelying thoracic rotation x 5 each ?Cat cow x 5 ?Quadruped thoracic rotation with [place] x 5 each ?Bird dog x 10 ?Dead bug x 10 ?SL bridge with knee to chest x 10 each ?Modified side plank on knees with row using green x 10 each ?Pallof press with black x 10 each ?Deadlift with 30# x 10 ? ? ?12/12/2021: ? Therapeutic Exercise: ?UBE L1 x 4 min (2 fwd/bwd) ?Seated upper trap stretch 2 x 20 sec each ?Supine cervical retraction with small towel roll under neck x 10 with 5 sec hold ?Row with blue 2 x 10 ?Standing thoracic extension stretch at counter 5 x 5 sec ?1/2 kneeling thoracic rotation at wall x 5  each (towards and away) ?1/2 kneeing arrow pull at wall with green x 10 each ?Manual Therapy: ?Skilled palpation and monitoring of muscle tension while performing TPDN ?STM bilaterally upper trap regions ?Suboccipital release with gentle manual traction x 3 bouts ?Trigger Point Dry Needling Treatment: ?Pre-treatment instruction: Patient instructed on dry needling rationale, procedures, and possible side effects including pain during treatment (achy,cramping feeling), bruising, drop of blood, lightheadedness, nausea, sweating. ?Patient Consent Given: Yes ?Education handout provided: No ?Muscles treated: bilateral upper traps  ?Needle size and number: .30x74m x 4 ?Electrical stimulation performed: No ?Parameters: N/A ?Treatment response/outcome: Twitch response elicited, Palpable decrease in muscle tension, and patient report reduced tightness. ?Post-treatment instructions: Patient instructed to expect possible mild to moderate muscle soreness later today and/or tomorrow. Patient instructed in methods to reduce muscle soreness and to continue prescribed HEP. If patient was dry needled over the lung field, patient was instructed on signs and symptoms of pneumothorax and, however unlikely, to see immediate medical attention should they occur. Patient was also educated on signs and symptoms of infection and  to seek medical attention should they occur. Patient verbalized understanding of these instructions and education. ? ?12/07/2021: ? Therapeutic Exercise: ?UBE L1 x 4 min (2 fwd/bwd) ?Sidelying thoracic rotation x 10 each ?Supin

## 2021-12-15 ENCOUNTER — Encounter: Payer: Self-pay | Admitting: Physical Therapy

## 2021-12-15 ENCOUNTER — Other Ambulatory Visit: Payer: Self-pay

## 2021-12-15 ENCOUNTER — Telehealth: Payer: Self-pay | Admitting: *Deleted

## 2021-12-15 ENCOUNTER — Ambulatory Visit: Payer: Medicare HMO | Admitting: Physical Therapy

## 2021-12-15 DIAGNOSIS — D492 Neoplasm of unspecified behavior of bone, soft tissue, and skin: Secondary | ICD-10-CM | POA: Diagnosis not present

## 2021-12-15 DIAGNOSIS — D1801 Hemangioma of skin and subcutaneous tissue: Secondary | ICD-10-CM | POA: Diagnosis not present

## 2021-12-15 DIAGNOSIS — M62830 Muscle spasm of back: Secondary | ICD-10-CM | POA: Diagnosis not present

## 2021-12-15 DIAGNOSIS — M542 Cervicalgia: Secondary | ICD-10-CM | POA: Diagnosis not present

## 2021-12-15 DIAGNOSIS — D225 Melanocytic nevi of trunk: Secondary | ICD-10-CM | POA: Diagnosis not present

## 2021-12-15 DIAGNOSIS — L814 Other melanin hyperpigmentation: Secondary | ICD-10-CM | POA: Diagnosis not present

## 2021-12-15 DIAGNOSIS — L578 Other skin changes due to chronic exposure to nonionizing radiation: Secondary | ICD-10-CM | POA: Diagnosis not present

## 2021-12-15 DIAGNOSIS — L821 Other seborrheic keratosis: Secondary | ICD-10-CM | POA: Diagnosis not present

## 2021-12-15 DIAGNOSIS — M6281 Muscle weakness (generalized): Secondary | ICD-10-CM | POA: Diagnosis not present

## 2021-12-15 NOTE — Telephone Encounter (Signed)
? ?  Pre-operative Risk Assessment  ?  ?Patient Name: Kara Warren  ?DOB: Jan 24, 1956 ?MRN: [id number]  ? ?  ? ?Request for Surgical Clearance   ? ?Procedure:   BLEPHAROPLASTY ? ?Date of Surgery:  Clearance 12/29/21                              ?   ?Surgeon:  DR. DANIEL LUPPENS ?Surgeon's Group or Practice Name:  [facility] ?Phone number:  [phone] ?Fax number:  [phone] ?  ?Type of Clearance Requested:   ?- Medical  ?- Pharmacy:  Hold Aspirin   ?  ?Type of Anesthesia:  General  ?  ?Additional requests/questions:   ? ?Signed, ?Julaine Hua   ?12/15/2021, 1:02 PM  ? ?

## 2021-12-15 NOTE — Patient Instructions (Signed)
Access Code: [email or web address] ?URL: [email or web address] ?Date: 12/15/2021 ?Prepared by: Hilda Blades ? ?Exercises ?Sidelying Thoracic Lumbar Rotation - 1 x daily - 10 reps - 5 seconds hold ?Child's Pose Thoracic Rotation with Hand on Neck - 1 x daily - 10 reps - 5 seconds hold ?Cat Cow - 1 x daily - 10 reps - 5 seconds hold ?Thoracic Extension Mobilization on Foam Roll - 1 x daily - 3 sets - 3-5 reps - 5 seconds hold ?Standing 'L' Stretch at Counter - 1 x daily - 10 reps - 5 seconds hold ?Seated Passive Cervical Retraction - 1 x daily - 7 x weekly - 3 sets - 10 reps - 3 hold ?Seated Cervical Retraction and Rotation - 1 x daily - 7 x weekly - 1 sets - 5 reps - 3 hold ?Standing Cervical Retraction with Sidebending - 1 x daily - 7 x weekly - 1 sets - 5 reps - 3 hold ?Standing Shoulder Row with Anchored Resistance - 1 x daily - 7 x weekly - 2-3 sets - 10 reps - 3 hold ?Shoulder External Rotation and Scapular Retraction with Resistance - 1 x daily - 7 x weekly - 2-3 sets - 10 reps - 3 hold ?Shoulder extension with resistance - Neutral - 1 x daily - 7 x weekly - 2-3 sets - 10 reps - 3 hold ?90/90 Lower Trunk Rotation - 1 x daily - 7 x weekly - 10 reps ?Supine Dead Bug with Leg Extension - 1 x daily - 7 x weekly - 2-3 sets - 10 reps ?Supine Bridge with Knee to Chest - 1 x daily - 7 x weekly - 2-3 sets - 10 reps ?Bird Dog - 1 x daily - 7 x weekly - 2-3 sets - 10 reps ? ?

## 2021-12-15 NOTE — Telephone Encounter (Signed)
Pre-op covering staff, I don't see a pre-op clearance form for this lady. Can you follow-up on this? ? ?Thanks so much! ?Agnes Brightbill ?

## 2021-12-16 ENCOUNTER — Telehealth: Payer: Self-pay | Admitting: *Deleted

## 2021-12-16 ENCOUNTER — Encounter: Payer: Self-pay | Admitting: Cardiovascular Disease

## 2021-12-16 NOTE — Therapy (Signed)
?OUTPATIENT PHYSICAL THERAPY TREATMENT NOTE ? ? ?Patient Name: Kara Warren ?MRN: [id number] ?DOB:05-Jun-1956, 66 y.o., female ?Today's Date: 12/19/2021 ? ?PCP: Isaac Bliss, Rayford Halsted, MD ?REFERRING PROVIDER: Isaac Bliss, Estel* ? ? PT End of Session - 12/19/21 1041   ? ? Visit Number 7   ? Number of Visits 10   ? Date for PT Re-Evaluation 01/23/22   ? Authorization Type AETNA MEDICARE   ? Progress Note Due on Visit 10   ? PT Start Time 1045   ? PT Stop Time 1130   ? PT Time Calculation (min) 45 min   ? Activity Tolerance Patient tolerated treatment well   ? Behavior During Therapy [facility] for tasks assessed/performed   ? ?  ?  ? ?  ? ? ? ? ? ? ? ?Past Medical History:  ?Diagnosis Date  ? Abdominal pain, left upper quadrant   ? Acute bronchospasm   ? Acute neck pain 07/02/2019  ? Acute sinusitis, unspecified 05/20/2009  ? Centricity Description: SINUSITIS - ACUTE-NOS Qualifier: Diagnosis of  By: Birdie Riddle MD, Belenda Cruise   Centricity Description: ACUTE SINUSITIS, UNSPECIFIED Qualifier: Diagnosis of  By: Alveta Heimlich MD, Cornelia Copa   Centricity Description: SINUSITIS, ACUTE Qualifier: Diagnosis of  By: Koleen Nimrod MD, Dellis Filbert    ? Atypical chest pain 03/05/2013  ? Body mass index (BMI) of 23.0-23.9 in adult   ? Cervicalgia   ? Colon polyp   ? Costochondral junction syndrome   ? Cough   ? DEPRESSION 08/25/2008  ? Qualifier: Diagnosis of  By: Ronnald Ramp CMA, Chemira    ? Diarrhea   ? Diverticulosis 07/27/2012  ? Dorsalgia   ? Elevated blood pressure reading without diagnosis of hypertension   ? Endometrial polyp 8/08  ? benign  ? Essential hypertension 09/20/2020  ? Family history of osteoporosis 12/28/2011  ? Mother pt had normal Dexa 11/2009   ? Fatigue   ? GERD (gastroesophageal reflux disease)   ? HIP PAIN, RIGHT 04/12/2009  ? Qualifier: Diagnosis of  By: Oneida Alar MD, KARL    ? History of hiatal hernia   ? HYPERLIPIDEMIA 08/25/2008  ? Qualifier: Diagnosis of  By: Birdie Riddle MD, Belenda Cruise    ? IBS (irritable bowel syndrome)   ? Left hip pain  07/30/2013  ? Left wrist pain 07/30/2013  ? Lumbar strain, initial encounter 07/28/2019  ? LUNG NODULE 11/19/2009  ? Neg CT    ? 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 Menopause   ? Migraine   ? NEUTROPENIA UNSPECIFIED 10/07/2008  ? Qualifier: Diagnosis of  By: Birdie Riddle MD, Belenda Cruise    ? [place] LUNG FIELD 11/19/2009  ? Qualifier: Diagnosis of  By: Alveta Heimlich MD, Cornelia Copa    ? Nonspecific (abnormal) findings on radiological and other examination of body structure 11/19/2009  ? Qualifier: Diagnosis of By: Alveta Heimlich MD, Bennie Dallas list entry automatically replaced. Please review for accuracy.  ? Otalgia, unspecified ear   ? Pain in throat   ? Palpitations   ? Paroxysmal atrial fibrillation (HCC)   ? Peroneal tendinitis of right lower extremity 03/12/2014  ? Plantar wart 04/02/2014  ? Pneumonia   ? RHINITIS 06/21/2009  ? Qualifier: Diagnosis of  By: Birdie Riddle MD, Belenda Cruise    ? Right ankle injury, subsequent encounter 01/11/2018  ? Right foot pain 08/09/2011  ? Sinusitis, chronic   ? Skene's gland abscess 2009  ? patient unaware  ? Sprain of unspecified ligament of right ankle, subsequent encounter   ?  TRANSAMINASES, SERUM, ELEVATED 12/07/2009  ? Qualifier: Diagnosis of  By: Inda Castle FNP, Wellington Hampshire   ? Unspecified cataract   ? Urinary tract infection, site not specified   ? Vitamin D deficiency   ? ?Past Surgical History:  ?Procedure Laterality Date  ? COLONOSCOPY    ? DILATION AND CURETTAGE OF UTERUS  8/08  ? [place]  ? HYSTEROSCOPY WITH D & C N/A 03/30/2017  ? Procedure: DILATATION & CURETTAGE/HYSTEROSCOPY WITH ULTRASOUND GUIDANCE;  Surgeon: Megan Salon, MD;  Location: [facility];  Service: Gynecology;  Laterality: N/A;  ? UPPER GI ENDOSCOPY    ? uterine ablation  2007  ? ?Patient Active Problem List  ? Diagnosis Date Noted  ? Secondary hypercoagulable state ([place]) 09/26/2021  ? Colonoscopy causing post-procedural bleeding 09/20/2020  ? Essential hypertension  09/20/2020  ? AF (paroxysmal atrial fibrillation) ([place]) 09/20/2020  ? Plantar wart 04/02/2014  ? Peroneal tendinitis of right lower extremity 03/12/2014  ? Diverticulosis 07/27/2012  ? Sinusitis, chronic   ? Colon polyp   ? Menopause   ? Migraine   ? [place] LUNG FIELD 11/19/2009  ? HIP PAIN, RIGHT 04/12/2009  ? Hyperlipidemia 08/25/2008  ? Depression 08/25/2008  ? ? ?REFERRING DIAG: Neck pain ? ?THERAPY DIAG:  ?Cervicalgia ? ?Muscle weakness (generalized) ? ?Muscle spasm of back ? ?SUBJECTIVE: Patient reports she is doing well. No new issues.  ? ?PAIN:  ?Are you having pain? Yes ?NPRS scale: 1/10 ?Pain location: Neck, upper-mid-lower back ?Pain orientation: Bilateral ?PAIN TYPE: Chronic ?Pain description: Constant, tightness ?Aggravating factors: Nothing specific ?Relieving factors: OTC meds, heat, stretch, massage ? ?PATIENT GOALS: Improve feeling of neck/upper back tightness ? ? ?OBJECTIVE:  ?PATIENT SURVEYS:  ?FOTO 72% functional status (predicted 72%) ? ?POSTURE:  ?Slightly rounded shoulder and forward head posture ? ?PALPATION: ?TTP bilateral upper trap, cervical paraspinals  ? ?CERVICAL AROM ? ?AROM AROM (deg) ?11/28/2021  ? ?12/12/2021  ?Flexion 50   ?Extension 40   ?Right lateral flexion 25 30  ?Left lateral flexion 20 30  ?Right rotation 45 50  ?Left rotation 50 50  ? ?Thoracic AROM grossly limited with feeling of tightness primarily in rotation and extension ? ?UE AROM/PROM: ?  UE AROM grossly WFL and non-painful  ? ?UE MMT: ? ?MMT Right ?11/28/2021 Left ?11/28/2021 Right / Left ?12/19/2021  ?Shoulder flexion '5 5 5 '$ / 5  ?Shoulder extension 4+ 4+ 5 / 5  ?Shoulder abduction 5 5   ?Shoulder internal rotation 5 5   ?Shoulder external rotation '5 5 5 '$ / 5  ?Middle trapezius 4 4- 4 / 4  ?Lower trapezius 4- 4- 4 / 4  ? ?FUNCTIONAL TESTS:  ?DNF endurance: 17 seconds ? ? ?TODAY'S TREATMENT:  ?12/19/2021: ? Therapeutic Exercise: ?UBE L1 x 4 min (2 fwd/bwd) ?Supine lumbar stretch x 3  each ?Sidelying thoracic rotation x 3 each ?Sidelying banded thoracic rotation with blue x 5 each ?Cat cow x 5 ?Bird dog x 10 ?Modified side plank on knees with clamshell x 10 each ?Modified side plank on knees with thread the needle x 5 each ?Dead bug x 10 ?SL bridge with knee to chest x 10 each ?Machine low row 25# x 10, 35# x 5 ?Pallof press with FM 10# x 10 each ?Spring board bar pull down with yellow springs x 10 ?Kneeling thoracic extension stretch at wall x 5 ?Deadlift with 45# 2 x 10 ? ? ?12/15/2021: ? Therapeutic Exercise: ?UBE L1 x 4  min (2 fwd/bwd) ?Supine LTR figure-4 x 5 each ?Sidelying thoracic rotation x 5 each ?Cat cow x 5 ?Quadruped thoracic rotation with [place] x 5 each ?Bird dog x 10 ?Dead bug x 10 ?SL bridge with knee to chest x 10 each ?Modified side plank on knees with row using green x 10 each ?Pallof press with black x 10 each ?Deadlift with 30# x 10 ? ?12/12/2021: ? Therapeutic Exercise: ?UBE L1 x 4 min (2 fwd/bwd) ?Seated upper trap stretch 2 x 20 sec each ?Supine cervical retraction with small towel roll under neck x 10 with 5 sec hold ?Row with blue 2 x 10 ?Standing thoracic extension stretch at counter 5 x 5 sec ?1/2 kneeling thoracic rotation at wall x 5 each (towards and away) ?1/2 kneeing arrow pull at wall with green x 10 each ?Manual Therapy: ?Skilled palpation and monitoring of muscle tension while performing TPDN ?STM bilaterally upper trap regions ?Suboccipital release with gentle manual traction x 3 bouts ?Trigger Point Dry Needling Treatment: ?Pre-treatment instruction: Patient instructed on dry needling rationale, procedures, and possible side effects including pain during treatment (achy,cramping feeling), bruising, drop of blood, lightheadedness, nausea, sweating. ?Patient Consent Given: Yes ?Education handout provided: No ?Muscles treated: bilateral upper traps  ?Needle size and number: .30x12m x 4 ?Electrical stimulation performed: No ?Parameters: N/A ?Treatment  response/outcome: Twitch response elicited, Palpable decrease in muscle tension, and patient report reduced tightness. ?Post-treatment instructions: Patient instructed to expect possible mild to moderate muscle soreness later today and/

## 2021-12-16 NOTE — Telephone Encounter (Signed)
Pt agreeable to plan of care for tele pre opa ppt 12/19/21 @ 1 pm. Med rec and consent are done.  ? ?Pt would like to still keep Dr. Angelena Form appt on 01/02/22 for her annual appt.  ?

## 2021-12-16 NOTE — Telephone Encounter (Signed)
Left message for the pt to call back and ask to s/w with Arbie Cookey with pre op. Pt has procedure 3/30 and has appt with Dr. Angelena Form 01/02/22. I would like to d/w the pt if she would like to proceed with tele pre op appt per pre op provider or would she like to keep her in person appt with Dr. Angelena Form 4/3 which would mean she would need to move her procedure out. I would like to give the pt the option that suits her best.  ?

## 2021-12-16 NOTE — Telephone Encounter (Signed)
Patient's aspirin is prescribed by a noncardiac provider.  Recommendations for holding aspirin will need to come from PCP/prescribing provider. ? ?Preoperative team, please contact this patient and set up a phone call appointment for further cardiac evaluation.  Thank you for your help. ? ?Kara Warren. Kara Mcgrady NP-C ? ?  ?12/16/2021, 9:08 AM ?[place] ?[address] ?Office [phone] Fax [phone] ? ?

## 2021-12-16 NOTE — Telephone Encounter (Signed)
?  Patient Consent for Virtual Visit  ? ? ?   ? ?Kara Warren has provided verbal consent on 12/16/2021 for a virtual visit (video or telephone). ? ? ?CONSENT FOR VIRTUAL VISIT FOR:  Kara Warren  ?By participating in this virtual visit I agree to the following: ? ?I hereby voluntarily request, consent and authorize [facility] and its employed or contracted physicians, physician assistants, nurse practitioners or other licensed health care professionals (the Practitioner), to provide me with telemedicine health care services (the ?Services") as deemed necessary by the treating Practitioner. I acknowledge and consent to receive the Services by the Practitioner via telemedicine. I understand that the telemedicine visit will involve communicating with the Practitioner through live audiovisual communication technology and the disclosure of certain medical information by electronic transmission. I acknowledge that I have been given the opportunity to request an in-person assessment or other available alternative prior to the telemedicine visit and am voluntarily participating in the telemedicine visit. ? ?I understand that I have the right to withhold or withdraw my consent to the use of telemedicine in the course of my care at any time, without affecting my right to future care or treatment, and that the Practitioner or I may terminate the telemedicine visit at any time. I understand that I have the right to inspect all information obtained and/or recorded in the course of the telemedicine visit and may receive copies of available information for a reasonable fee.  I understand that some of the potential risks of receiving the Services via telemedicine include:  ?Delay or interruption in medical evaluation due to technological equipment failure or disruption; ?Information transmitted may not be sufficient (e.g. poor resolution of images) to allow for appropriate medical decision making by the Practitioner;  and/or  ?In rare instances, security protocols could fail, causing a breach of personal health information. ? ?Furthermore, I acknowledge that it is my responsibility to provide information about my medical history, conditions and care that is complete and accurate to the best of my ability. I acknowledge that Practitioner's advice, recommendations, and/or decision may be based on factors not within their control, such as incomplete or inaccurate data provided by me or distortions of diagnostic images or specimens that may result from electronic transmissions. I understand that the practice of medicine is not an exact science and that Practitioner makes no warranties or guarantees regarding treatment outcomes. I acknowledge that a copy of this consent can be made available to me via my patient portal ([facility]), or I can request a printed copy by calling the office of [facility].   ? ?I understand that my insurance will be billed for this visit.  ? ?I have read or had this consent read to me. ?I understand the contents of this consent, which adequately explains the benefits and risks of the Services being provided via telemedicine.  ?I have been provided ample opportunity to ask questions regarding this consent and the Services and have had my questions answered to my satisfaction. ?I give my informed consent for the services to be provided through the use of telemedicine in my medical care ? ? ? ?

## 2021-12-19 ENCOUNTER — Ambulatory Visit (INDEPENDENT_AMBULATORY_CARE_PROVIDER_SITE_OTHER): Payer: Medicare HMO | Admitting: Physician Assistant

## 2021-12-19 ENCOUNTER — Other Ambulatory Visit: Payer: Self-pay

## 2021-12-19 ENCOUNTER — Encounter: Payer: Self-pay | Admitting: Physician Assistant

## 2021-12-19 ENCOUNTER — Ambulatory Visit: Payer: Medicare HMO | Admitting: Physical Therapy

## 2021-12-19 ENCOUNTER — Encounter: Payer: Self-pay | Admitting: Physical Therapy

## 2021-12-19 DIAGNOSIS — M6281 Muscle weakness (generalized): Secondary | ICD-10-CM | POA: Diagnosis not present

## 2021-12-19 DIAGNOSIS — M542 Cervicalgia: Secondary | ICD-10-CM | POA: Diagnosis not present

## 2021-12-19 DIAGNOSIS — M62830 Muscle spasm of back: Secondary | ICD-10-CM | POA: Diagnosis not present

## 2021-12-19 DIAGNOSIS — Z01810 Encounter for preprocedural cardiovascular examination: Secondary | ICD-10-CM | POA: Diagnosis not present

## 2021-12-19 NOTE — Progress Notes (Signed)
Virtual Visit via Telephone Note   This visit type was conducted due to national recommendations for restrictions regarding the COVID-19 Pandemic (e.g. social distancing) in an effort to limit this patient's exposure and mitigate transmission in our community.  Due to her co-morbid illnesses, this patient is at least at moderate risk for complications without adequate follow up.  This format is felt to be most appropriate for this patient at this time.  The patient did not have access to video technology/had technical difficulties with video requiring transitioning to audio format only (telephone).  All issues noted in this document were discussed and addressed.  No physical exam could be performed with this format.  Please refer to the patient's chart for her  consent to telehealth for [facility] Evaluation Performed:  Preoperative cardiovascular risk assessment  This visit type was conducted due to national recommendations for restrictions regarding the COVID-19 Pandemic (e.g. social distancing).  This format is felt to be most appropriate for this patient at this time.  All issues noted in this document were discussed and addressed.  No physical exam was performed (except for noted visual exam findings with Video Visits).  Please refer to the patient's chart (MyChart message for video visits and phone note for telephone visits) for the patient's consent to telehealth for [facility]. _____________   Date:  12/19/2021   Patient ID:  Kara Warren, DOB March 12, 1956, MRN [id number] Patient Location:  Home Provider location:   Office  Primary Care Provider:  Philip Aspen, Limmie Patricia, MD Primary Cardiologist:  Verne Carrow, MD  Chief Complaint    66 y.o. y/o female with a h/o PAF not on OAC  and HLD, who is pending blepharoplasty and presents today for telephonic preoperative cardiovascular risk assessment.  Past Medical History    Past Medical History:  Diagnosis Date    Abdominal pain, left upper quadrant    Acute bronchospasm    Acute neck pain 07/02/2019   Acute sinusitis, unspecified 05/20/2009   Centricity Description: SINUSITIS - ACUTE-NOS Qualifier: Diagnosis of  By: Beverely Low MD, Natalia Leatherwood   Centricity Description: ACUTE SINUSITIS, UNSPECIFIED Qualifier: Diagnosis of  By: Thurmond Butts MD, Dennard Nip   Centricity Description: SINUSITIS, ACUTE Qualifier: Diagnosis of  By: Orson Aloe MD, Tinnie Gens     Atypical chest pain 03/05/2013   Body mass index (BMI) of 23.0-23.9 in adult    Cervicalgia    Colon polyp    Costochondral junction syndrome    Cough    DEPRESSION 08/25/2008   Qualifier: Diagnosis of  By: Yetta Barre CMA, Chemira     Diarrhea    Diverticulosis 07/27/2012   Dorsalgia    Elevated blood pressure reading without diagnosis of hypertension    Endometrial polyp 8/08   benign   Essential hypertension 09/20/2020   Family history of osteoporosis 12/28/2011   Mother pt had normal Dexa 11/2009    Fatigue    GERD (gastroesophageal reflux disease)    HIP PAIN, RIGHT 04/12/2009   Qualifier: Diagnosis of  By: Darrick Penna MD, KARL     History of hiatal hernia    HYPERLIPIDEMIA 08/25/2008   Qualifier: Diagnosis of  By: Beverely Low MD, Natalia Leatherwood     IBS (irritable bowel syndrome)    Left hip pain 07/30/2013   Left wrist pain 07/30/2013   Lumbar strain, initial encounter 07/28/2019   LUNG NODULE 11/19/2009   Neg CT     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 Please review for accuracy.   Otalgia, unspecified ear    Pain in throat    Palpitations     Paroxysmal atrial fibrillation (HCC)    Peroneal tendinitis of right lower extremity 03/12/2014   Plantar wart 04/02/2014   Pneumonia    RHINITIS 06/21/2009   Qualifier: Diagnosis of  By: Beverely Low MD, Natalia Leatherwood     Right ankle injury, subsequent encounter 01/11/2018   Right foot pain 08/09/2011   Sinusitis, chronic    Skene's gland abscess 2009   patient unaware   Sprain of unspecified ligament of right ankle, subsequent encounter    TRANSAMINASES, SERUM, ELEVATED 12/07/2009   Qualifier: Diagnosis of  By: Peggyann Juba FNP, Melissa S    Unspecified cataract    Urinary tract infection, site not specified    Vitamin D deficiency    Past Surgical History:  Procedure Laterality Date   COLONOSCOPY     DILATION AND CURETTAGE OF UTERUS  8/08   GYNECOLOGIC CRYOSURGERY  1980   HYSTEROSCOPY WITH D & C N/A 03/30/2017   Procedure: DILATATION & CURETTAGE/HYSTEROSCOPY WITH ULTRASOUND GUIDANCE;  Surgeon: Jerene Bears, MD;  Location: [facility];  Service: Gynecology;  Laterality: N/A;   UPPER GI ENDOSCOPY     uterine ablation  2007    Allergies  Allergies  Allergen Reactions   Augmentin [Amoxicillin-Pot Clavulanate] Diarrhea    History of Present Illness    Kara Warren is a 66 y.o. female who presents via audio/video conferencing for a telehealth visit today.  Pt was last seen in cardiology clinic on 09/01/21, by Gillian Shields NP.  At that time Kara Warren was doing well.  she is now pending blepharoplasty.  Since his last visit, she is able to walk, do pilates, and is playing pickleball. She also spins once weekly. She no longer runs. She has no cardiac complaints. Her Afib is controlled with medications. No recent syncope.   Home Medications    Prior to Admission medications   Medication Sig Start Date End Date Taking? Authorizing Provider  aspirin EC 81 MG tablet Take 81 mg by mouth daily. Swallow whole.    [provider]  azelastine (ASTELIN) 0.1 % nasal spray Place 1 spray  into both nostrils daily in the afternoon. Place 1 spray into both nostrils daily as needed for allergies.    [provider]  citalopram (CELEXA) 20 MG tablet Take 1.5 tablets (30 mg total) by mouth daily. Patient taking differently: Take 20 mg by mouth daily. 08/23/21   Philip Aspen, Limmie Patricia, MD  diltiazem (CARDIZEM) 30 MG tablet TAKE 1 TABLET BY MOUTH 2 TIMES A DAY IF NEEDED FOR AFIB 12/28/20 12/28/21  Lewis Moccasin, MD  famotidine (PEPCID) 10 MG tablet Take 1 tablet (10 mg total) by mouth 2 (two) times daily. 08/23/21   Philip Aspen, Limmie Patricia, MD  fluticasone (FLONASE) 50 MCG/ACT nasal spray Place 1-2 sprays into both nostrils daily. 07/12/21     metoprolol succinate (TOPROL-XL) 25 MG 24 hr tablet Take 1 tablet (25 mg total) by mouth daily. 12/09/21   Philip Aspen,  Limmie Patricia, MD  Multiple Vitamins-Minerals (MULTI FOR HER 50+) TABS Take 1 tablet by mouth daily.    [provider]  Omega-3 Fatty Acids (FISH OIL) 1000 MG CAPS Take 1,000 mg by mouth daily.    [provider]  omeprazole (PRILOSEC) 20 MG capsule Take 1 capsule (20 mg total) by mouth in the morning 30 minutes before morning meal Patient not taking: Reported on 12/16/2021 12/12/21   Philip Aspen, Limmie Patricia, MD  ondansetron (ZOFRAN-ODT) 4 MG disintegrating tablet Dissolve 1 tablet (4 mg total) by mouth every 8 (eight) hours as needed for nausea or vomiting. 12/11/21   Janne Napoleon, MD  Probiotic Product (PROBIOTIC PO) Take 1 capsule by mouth daily. Called [employer]    [provider]  vitamin C (ASCORBIC ACID) 500 MG tablet Take 500 mg by mouth daily.    [provider]    Physical Exam    Vital Signs:  Kara Warren does not have vital signs available for review today.  Given telephonic nature of communication, physical exam is limited. AAOx3. NAD. Normal affect.  Speech and respirations are unlabored.  Accessory Clinical Findings    None  Assessment &  Plan    1.  Preoperative Cardiovascular Risk Assessment:  She does not have history of ischemic heart disease, PCI, or stroke.  She exercises regularly and can complete more than 4 METS without angina.  She is at acceptable risk to proceed with planned procedure.  Through shared decision making, she is opted to defer anticoagulation for her PAF.  Instead she has been taking 81 mg aspirin.  We prefer to continue aspirin throughout the perioperative period, however if doing so significantly increases morbidity or mortality she may hold aspirin 5 to 7 days prior to procedure.    COVID-19 Education: The signs and symptoms of COVID-19 were discussed with the patient and how to seek care for testing (follow up with PCP or arrange E-visit).  The importance of social distancing was discussed today.  Patient Risk:   After full review of this patient's history and clinical status, I feel that he is at least moderate risk for cardiac complications at this time, thus necessitating a telehealth visit sooner than our first available in office visit.  Time:   Today, I have spent 10 minutes with the patient with telehealth technology discussing medical history, symptoms, and management plan.     Roe Rutherford Rober Skeels, PA  12/19/2021, 1:21 PM

## 2021-12-21 ENCOUNTER — Telehealth: Payer: Self-pay

## 2021-12-21 ENCOUNTER — Ambulatory Visit: Payer: Medicare HMO | Admitting: Family Medicine

## 2021-12-21 NOTE — Telephone Encounter (Signed)
Received surgical clearance from Fabian Sharp, PA at [facility]. Patient is cleared for surgery, but hold ASA '81mg'$  5-7 days prior to day of surgery. Called patient, LMVM with directions holding medication. ?

## 2021-12-22 NOTE — Therapy (Signed)
?OUTPATIENT PHYSICAL THERAPY TREATMENT NOTE ? ? ?Patient Name: Kara Warren ?MRN: [id number] ?DOB:1955-11-23, 66 y.o., female ?Today's Date: 12/26/2021 ? ?PCP: Isaac Bliss, Rayford Halsted, MD ?REFERRING PROVIDER: Dene Gentry, MD ? ? PT End of Session - 12/26/21 1046   ? ? Visit Number 8   ? Number of Visits 10   ? Date for PT Re-Evaluation 01/23/22   ? Authorization Type AETNA MEDICARE   ? Progress Note Due on Visit 10   ? PT Start Time 1044   ? PT Stop Time 1125   ? PT Time Calculation (min) 41 min   ? Activity Tolerance Patient tolerated treatment well   ? Behavior During Therapy [facility] for tasks assessed/performed   ? ?  ?  ? ?  ? ? ? ? ? ? ? ? ?Past Medical History:  ?Diagnosis Date  ? Abdominal pain, left upper quadrant   ? Acute bronchospasm   ? Acute neck pain 07/02/2019  ? Acute sinusitis, unspecified 05/20/2009  ? Centricity Description: SINUSITIS - ACUTE-NOS Qualifier: Diagnosis of  By: Birdie Riddle MD, Belenda Cruise   Centricity Description: ACUTE SINUSITIS, UNSPECIFIED Qualifier: Diagnosis of  By: Alveta Heimlich MD, Cornelia Copa   Centricity Description: SINUSITIS, ACUTE Qualifier: Diagnosis of  By: Koleen Nimrod MD, Dellis Filbert    ? Atypical chest pain 03/05/2013  ? Body mass index (BMI) of 23.0-23.9 in adult   ? Cervicalgia   ? Colon polyp   ? Costochondral junction syndrome   ? Cough   ? DEPRESSION 08/25/2008  ? Qualifier: Diagnosis of  By: Ronnald Ramp CMA, Chem[place]    ? Diarrhea   ? Diverticulosis 07/27/2012  ? Dorsalgia   ? Elevated blood pressure reading without diagnosis of hypertension   ? Endometrial polyp 8/08  ? benign  ? Essential hypertension 09/20/2020  ? Family history of osteoporosis 12/28/2011  ? Mother pt had normal Dexa 11/2009   ? Fatigue   ? GERD (gastroesophageal reflux disease)   ? HIP PAIN, RIGHT 04/12/2009  ? Qualifier: Diagnosis of  By: Oneida Alar MD, KARL    ? History of hiatal hernia   ? HYPERLIPIDEMIA 08/25/2008  ? Qualifier: Diagnosis of  By: Birdie Riddle MD, Belenda Cruise    ? IBS (irritable bowel syndrome)   ? Left hip pain  07/30/2013  ? Left wrist pain 07/30/2013  ? Lumbar strain, initial encounter 07/28/2019  ? LUNG NODULE 11/19/2009  ? Neg CT    ? 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 Menopause   ? Migraine   ? NEUTROPENIA UNSPECIFIED 10/07/2008  ? Qualifier: Diagnosis of  By: Birdie Riddle MD, Belenda Cruise    ? [place] LUNG FIELD 11/19/2009  ? Qualifier: Diagnosis of  By: Alveta Heimlich MD, Cornelia Copa    ? Nonspecific (abnormal) findings on radiological and other examination of body structure 11/19/2009  ? Qualifier: Diagnosis of By: Alveta Heimlich MD, Bennie Dallas list entry automatically replaced. Please review for accuracy.  ? Otalgia, unspecified ear   ? Pain in throat   ? Palpitations   ? Paroxysmal atrial fibrillation (HCC)   ? Peroneal tendinitis of right lower extremity 03/12/2014  ? Plantar wart 04/02/2014  ? Pneumonia   ? RHINITIS 06/21/2009  ? Qualifier: Diagnosis of  By: Birdie Riddle MD, Belenda Cruise    ? Right ankle injury, subsequent encounter 01/11/2018  ? Right foot pain 08/09/2011  ? Sinusitis, chronic   ? Skene's gland abscess 2009  ? patient unaware  ? Sprain of unspecified ligament of right ankle,  subsequent encounter   ? TRANSAMINASES, SERUM, ELEVATED 12/07/2009  ? Qualifier: Diagnosis of  By: Inda Castle FNP, [place]   ? Unspecified cataract   ? Urinary tract infection, site not specified   ? Vitamin D deficiency   ? ?Past Surgical History:  ?Procedure Laterality Date  ? COLONOSCOPY    ? DILATION AND CURETTAGE OF UTERUS  8/08  ? [place]  ? HYSTEROSCOPY WITH D & C N/A 03/30/2017  ? Procedure: DILATATION & CURETTAGE/HYSTEROSCOPY WITH ULTRASOUND GUIDANCE;  Surgeon: Megan Salon, MD;  Location: [facility];  Service: Gynecology;  Laterality: N/A;  ? UPPER GI ENDOSCOPY    ? uterine ablation  2007  ? ?Patient Active Problem List  ? Diagnosis Date Noted  ? Secondary hypercoagulable state ([place]) 09/26/2021  ? Colonoscopy causing post-procedural bleeding 09/20/2020  ? Essential hypertension  09/20/2020  ? AF (paroxysmal atrial fibrillation) ([place]) 09/20/2020  ? Plantar wart 04/02/2014  ? Peroneal tendinitis of right lower extremity 03/12/2014  ? Diverticulosis 07/27/2012  ? Sinusitis, chronic   ? Colon polyp   ? Menopause   ? Migraine   ? [place] LUNG FIELD 11/19/2009  ? HIP PAIN, RIGHT 04/12/2009  ? Hyperlipidemia 08/25/2008  ? Depression 08/25/2008  ? ? ?REFERRING DIAG: Neck pain ? ?THERAPY DIAG:  ?Cervicalgia ? ?Muscle weakness (generalized) ? ?Muscle spasm of back ? ?SUBJECTIVE: Patient reports she is doing well. No new issues.  ? ?PAIN:  ?Are you having pain? ?NPRS scale: 0/10 ?Pain location: Neck, upper-mid-lower back ?Pain orientation: Bilateral ?PAIN TYPE: Chronic ?Pain description: Constant, tightness ?Aggravating factors: Nothing specific ?Relieving factors: OTC meds, heat, stretch, massage ? ?PATIENT GOALS: Improve feeling of neck/upper back tightness ? ? ?OBJECTIVE:  ?PATIENT SURVEYS:  ?FOTO 72% functional status (predicted 72%) ? ?POSTURE:  ?Slightly rounded shoulder and forward head posture ? ?PALPATION: ?TTP bilateral upper trap, cervical paraspinals  ? ?CERVICAL AROM ? ?AROM AROM (deg) ?11/28/2021  ? ?12/12/2021  ?Flexion 50   ?Extension 40   ?Right lateral flexion 25 30  ?Left lateral flexion 20 30  ?Right rotation 45 50  ?Left rotation 50 50  ? ?Thoracic AROM grossly limited with feeling of tightness primarily in rotation and extension ? ?UE AROM/PROM: ?  UE AROM grossly WFL and non-painful  ? ?UE MMT: ? ?MMT Right ?11/28/2021 Left ?11/28/2021 Right / Left ?12/19/2021  ?Shoulder flexion '5 5 5 '$ / 5  ?Shoulder extension 4+ 4+ 5 / 5  ?Shoulder abduction 5 5   ?Shoulder internal rotation 5 5   ?Shoulder external rotation '5 5 5 '$ / 5  ?Middle trapezius 4 4- 4 / 4  ?Lower trapezius 4- 4- 4 / 4  ? ?FUNCTIONAL TESTS:  ?DNF endurance: 17 seconds ? ? ?TODAY'S TREATMENT:  ?12/26/2021: ? Therapeutic Exercise: ?UBE L1 x 4 min (2 fwd/bwd) ?Sidelying thoracic rotation x 5  each ?Sidelying banded thoracic rotation with blue x 5 each ?Supine thoracic extension mob with FR x 5 ?Cat cow x 5 ?Quadruped thoracic rotation x 5 each ?Modified side plank on knees with row using blue 2 x 10 each ?SL bridge with knee to chest 2 x 10 each ?Cable lift / chop using FM with bar attachment 7# 2 x 5 each ?Spring board bar pull down with yellow springs 2 x 10 ?Deadlift with 45# 2 x 10 ? ? ?12/19/2021: ? Therapeutic Exercise: ?UBE L1 x 4 min (2 fwd/bwd) ?Supine lumbar stretch x 3 each ?Sidelying thoracic rotation x 3  each ?Sidelying banded thoracic rotation with blue x 5 each ?Cat cow x 5 ?Bird dog x 10 ?Modified side plank on knees with clamshell x 10 each ?Modified side plank on knees with thread the needle x 5 each ?Dead bug x 10 ?SL bridge with knee to chest x 10 each ?Machine low row 25# x 10, 35# x 5 ?Pallof press with FM 10# x 10 each ?Spring board bar pull down with yellow springs x 10 ?Kneeling thoracic extension stretch at wall x 5 ?Deadlift with 45# 2 x 10 ? ?12/15/2021: ? Therapeutic Exercise: ?UBE L1 x 4 min (2 fwd/bwd) ?Supine LTR figure-4 x 5 each ?Sidelying thoracic rotation x 5 each ?Cat cow x 5 ?Quadruped thoracic rotation with Ceiba x 5 each ?Bird dog x 10 ?Dead bug x 10 ?SL bridge with knee to chest x 10 each ?Modified side plank on knees with row using green x 10 each ?Pallof press with black x 10 each ?Deadlift with 30# x 10 ? ?PATIENT EDUCATION:  ?Education details: HEP ?Person educated: Patient ?Education method: Explanation, Demonstration, Tactile cues, and Verbal cues ?Education comprehension: verbalized understanding, returned demonstration, verbal cues required, tactile cues required, and needs further education ? ?HOME EXERCISE PROGRAM: ?Access Code: [email or web address] ? ? ?ASSESSMENT: ?CLINICAL IMPRESSION: ?Patient tolerated therapy well with no adverse effects. Therapy focused on thoracic mobility and progression of periscapular and core strengthening. Patient continues to progress well  with therapy and seems to be improving overall with less tightness reported. No pain reported with therapy this visit. No changes to HEP. Patient would benefit from continued skilled PT to progress mobility and strength in

## 2021-12-26 ENCOUNTER — Encounter: Payer: Self-pay | Admitting: Physical Therapy

## 2021-12-26 ENCOUNTER — Ambulatory Visit: Payer: Medicare HMO | Admitting: Physical Therapy

## 2021-12-26 ENCOUNTER — Other Ambulatory Visit: Payer: Self-pay

## 2021-12-26 DIAGNOSIS — M542 Cervicalgia: Secondary | ICD-10-CM

## 2021-12-26 DIAGNOSIS — M6281 Muscle weakness (generalized): Secondary | ICD-10-CM | POA: Diagnosis not present

## 2021-12-26 DIAGNOSIS — M62830 Muscle spasm of back: Secondary | ICD-10-CM | POA: Diagnosis not present

## 2021-12-28 ENCOUNTER — Encounter (HOSPITAL_COMMUNITY): Payer: Self-pay | Admitting: Plastic Surgery

## 2021-12-28 ENCOUNTER — Other Ambulatory Visit: Payer: Self-pay

## 2021-12-28 NOTE — Progress Notes (Signed)
Two VISITORS ARE ALLOWED TO COME WITH YOU AND STAY IN THE WAITING ROOM DURING PRE OP AND PROCEDURE DAY OF SURGERY.  ? ?PCP - Dr Reather Converse Hernandex ?Cardiologist - Dr Lauree Chandler (Cardio/Surgical Clearance in Epic on 12/19/21) ? ?Chest x-ray - 08/27/21 CE ?EKG - 09/01/21 ?Stress Test - 2014 ?ECHO - 09/04/19 ?Cardiac Cath - n/a ? ?ICD Pacemaker/Loop - n/a ? ?Sleep Study -  n/a ?CPAP - none ? ?Aspirin Instructions: Last dose ASA was on 12/23/21 per patient. ? ?Anesthesia review: Yes ? ?STOP now taking any Aspirin (unless otherwise instructed by your surgeon), Aleve, Naproxen, Ibuprofen, Motrin, Advil, Goody's, BC's, all herbal medications, fish oil, and all vitamins.  ? ?Coronavirus Screening ?Covid test n/a Ambulatory Surgery  ?Do you have any of the following symptoms:  ?Cough yes/no: No ?Fever (>100.36F)  yes/no: No ?Runny nose yes/no: No ?Sore throat yes/no: No ?Difficulty breathing/shortness of breath  yes/no: No ? ?Have you traveled in the last 14 days and where? Yes, [place] ?

## 2021-12-28 NOTE — Anesthesia Preprocedure Evaluation (Addendum)
Anesthesia Evaluation  ?Patient identified by MRN, date of birth, ID band ?Patient awake ? ? ? ?Reviewed: ?Allergy & Precautions, NPO status , Patient's Chart, lab work & pertinent test results ? ?History of Anesthesia Complications ?Negative for: history of anesthetic complications ? ?Airway ?Mallampati: II ? ?TM Distance: >3 FB ?Neck ROM: Full ? ? ? Dental ? ?(+) Dental Advisory Given, Teeth Intact ?  ?Pulmonary ?neg pulmonary ROS, former smoker,  ?  ?Pulmonary exam normal ? ? ? ? ? ? ? Cardiovascular ?Exercise Tolerance: Good ?hypertension, Normal cardiovascular exam+ dysrhythmias Atrial Fibrillation  ? ? ?Echo 09/04/19: EF 60-65%, mild LVH, g1dd, normal RVSF, mod LAE, valves unremarkable ?  ?Neuro/Psych ?negative neurological ROS ?   ? GI/Hepatic ?Neg liver ROS, hiatal hernia, GERD  ,  ?Endo/Other  ?negative endocrine ROS ? Renal/GU ?negative Renal ROS  ?negative genitourinary ?  ?Musculoskeletal ?negative musculoskeletal ROS ?(+)  ? Abdominal ?  ?Peds ? Hematology ?negative hematology ROS ?(+)   ?Anesthesia Other Findings ? ? Reproductive/Obstetrics ? ?  ? ? ? ? ? ? ? ? ? ? ? ? ? ?  ?  ? ? ? ? ? ?Anesthesia Physical ?Anesthesia Plan ? ?ASA: 2 ? ?Anesthesia Plan: General  ? ?Post-op Pain Management: Tylenol PO (pre-op)* and Toradol IV (intra-op)*  ? ?Induction: Intravenous ? ?PONV Risk Score and Plan: 3 and Ondansetron, Dexamethasone, Midazolam and Treatment may vary due to age or medical condition ? ?Airway Management Planned: LMA ? ?Additional Equipment: None ? ?Intra-op Plan:  ? ?Post-operative Plan: Extubation in OR ? ?Informed Consent: I have reviewed the patients History and Physical, chart, labs and discussed the procedure including the risks, benefits and alternatives for the proposed anesthesia with the patient or authorized representative who has indicated his/her understanding and acceptance.  ? ? ? ?Dental advisory given ? ?Plan Discussed with:  ? ?Anesthesia Plan  Comments: (PAT note by Karoline Caldwell, PA-C: ?Follows with cardiology for paroxysmal afib not on [place]. Seen by Fabian Sharp, PA-C for preop eval 12/19/21. She is noted to be very active, doing piliates and playing pickleball. Per note, "She does not have?history of ischemic heart disease, PCI, or stroke. ?She exercises regularly and can complete more than 4 METS without angina. ?She is at acceptable risk to proceed with planned procedure. ?Through shared decision making, she is opted to defer anticoagulation for her PAF. ?Instead she has been taking 81 mg aspirin. ?We prefer to continue aspirin throughout the perioperative period, however if doing so significantly increases morbidity or mortality she may hold aspirin 5 to 7 days prior to procedure." ? ?Pt will need DOS labs and eval. ? ?EKG 09/02/21: Sinus bradycardia. Rate 55. ? ?CT Cardiac scoring 04/06/21: ?IMPRESSION: ?Coronary calcium score of 0. This was 0 percentile for age-, race-, ?and sex-matched controls. ? ?Event monitor 10/07/19: ?Sinus rhythm with sinus bradycardia ?Atrial fibrillation/flutter noted. Longest duration 2 hours, 25 minutes.  ?Several short bursts of supraventricular tachycardia ?One 7 beat run of ventricular tachycardia.  ? ?TTE 09/04/19: ??1. Left ventricular ejection fraction, by visual estimation, is 60 to  ?65%. The left ventricle has normal function. Left ventricular septal wall  ?thickness was mildly increased. Mildly increased left ventricular  ?posterior wall thickness. There is mildly  ?increased left ventricular hypertrophy.  ??2. Left ventricular diastolic parameters are consistent with Grade I  ?diastolic dysfunction (impaired relaxation).  ??3. Global right ventricle has normal systolic function.The right  ?ventricular size is normal. No increase in right ventricular wall  ?thickness.  ??  4. Left atrial size was moderately dilated.  ??5. Right atrial size was normal.  ??6. The mitral valve is normal in structure. No evidence of mitral  valve  ?regurgitation. No evidence of mitral stenosis.  ??7. The tricuspid valve is normal in structure. Tricuspid valve  ?regurgitation is trivial.  ??8. The aortic valve is tricuspid. Aortic valve regurgitation is not  ?visualized. No evidence of aortic valve sclerosis or stenosis.  ??9. The pulmonic valve was normal in structure. Pulmonic valve  ?regurgitation is trivial.  ?10. Normal pulmonary artery systolic pressure.  ?11. The inferior vena cava is normal in size with greater than 50%  ?respiratory variability, suggesting right atrial pressure of 3 mmHg. ? ?)  ? ? ? ? ?Anesthesia Quick Evaluation ? ?

## 2021-12-28 NOTE — Progress Notes (Signed)
Anesthesia Chart Review: ?Same day workup ? ?Follows with cardiology for paroxysmal afib not on [place]. Seen by Fabian Sharp, PA-C for preop eval 12/19/21. She is noted to be very active, doing piliates and playing pickleball. Per note, "She does not have history of ischemic heart disease, PCI, or stroke.  She exercises regularly and can complete more than 4 METS without angina.  She is at acceptable risk to proceed with planned procedure.  Through shared decision making, she is opted to defer anticoagulation for her PAF.  Instead she has been taking 81 mg aspirin.  We prefer to continue aspirin throughout the perioperative period, however if doing so significantly increases morbidity or mortality she may hold aspirin 5 to 7 days prior to procedure." ? ?Pt will need DOS labs and eval. ? ?EKG 09/02/21: Sinus bradycardia. Rate 55. ? ?CT Cardiac scoring 04/06/21: ?IMPRESSION: ?Coronary calcium score of 0. This was 0 percentile for age-, race-, ?and sex-matched controls. ? ?Event monitor 10/07/19: ?Sinus rhythm with sinus bradycardia ?Atrial fibrillation/flutter noted. Longest duration 2 hours, 25 minutes.  ?Several short bursts of supraventricular tachycardia ?One 7 beat run of ventricular tachycardia.  ? ?TTE 09/04/19: ? 1. Left ventricular ejection fraction, by visual estimation, is 60 to  ?65%. The left ventricle has normal function. Left ventricular septal wall  ?thickness was mildly increased. Mildly increased left ventricular  ?posterior wall thickness. There is mildly  ?increased left ventricular hypertrophy.  ? 2. Left ventricular diastolic parameters are consistent with Grade I  ?diastolic dysfunction (impaired relaxation).  ? 3. Global right ventricle has normal systolic function.The right  ?ventricular size is normal. No increase in right ventricular wall  ?thickness.  ? 4. Left atrial size was moderately dilated.  ? 5. Right atrial size was normal.  ? 6. The mitral valve is normal in structure. No evidence of mitral  valve  ?regurgitation. No evidence of mitral stenosis.  ? 7. The tricuspid valve is normal in structure. Tricuspid valve  ?regurgitation is trivial.  ? 8. The aortic valve is tricuspid. Aortic valve regurgitation is not  ?visualized. No evidence of aortic valve sclerosis or stenosis.  ? 9. The pulmonic valve was normal in structure. Pulmonic valve  ?regurgitation is trivial.  ?10. Normal pulmonary artery systolic pressure.  ?11. The inferior vena cava is normal in size with greater than 50%  ?respiratory variability, suggesting right atrial pressure of 3 mmHg. ? ? ?Kara Caldwell, PA-C ?[facility]/Anesthesiology ?Phone [phone] ?12/28/2021 1:15 PM ? ?

## 2021-12-29 ENCOUNTER — Encounter (HOSPITAL_COMMUNITY): Payer: Self-pay | Admitting: Plastic Surgery

## 2021-12-29 ENCOUNTER — Ambulatory Visit (HOSPITAL_COMMUNITY)
Admission: RE | Admit: 2021-12-29 | Discharge: 2021-12-29 | Disposition: A | Discharge status: Home / Self Care | Payer: Medicare HMO | Attending: Plastic Surgery | Admitting: Plastic Surgery

## 2021-12-29 ENCOUNTER — Other Ambulatory Visit: Payer: Self-pay

## 2021-12-29 ENCOUNTER — Encounter (HOSPITAL_COMMUNITY)
Admission: RE | Disposition: A | Discharge status: Home / Self Care | Payer: Self-pay | Source: Home / Self Care | Attending: Plastic Surgery

## 2021-12-29 ENCOUNTER — Ambulatory Visit (HOSPITAL_BASED_OUTPATIENT_CLINIC_OR_DEPARTMENT_OTHER): Payer: Medicare HMO | Admitting: Physician Assistant

## 2021-12-29 ENCOUNTER — Ambulatory Visit (HOSPITAL_COMMUNITY): Payer: Medicare HMO | Admitting: Physician Assistant

## 2021-12-29 DIAGNOSIS — H02834 Dermatochalasis of left upper eyelid: Secondary | ICD-10-CM

## 2021-12-29 DIAGNOSIS — H02831 Dermatochalasis of right upper eyelid: Secondary | ICD-10-CM

## 2021-12-29 DIAGNOSIS — I480 Paroxysmal atrial fibrillation: Secondary | ICD-10-CM | POA: Diagnosis not present

## 2021-12-29 DIAGNOSIS — Z7982 Long term (current) use of aspirin: Secondary | ICD-10-CM | POA: Diagnosis not present

## 2021-12-29 DIAGNOSIS — Z87891 Personal history of nicotine dependence: Secondary | ICD-10-CM | POA: Insufficient documentation

## 2021-12-29 HISTORY — DX: Cardiac arrhythmia, unspecified: I49.9

## 2021-12-29 HISTORY — PX: BROW LIFT: SHX178

## 2021-12-29 HISTORY — DX: Anxiety disorder, unspecified: F41.9

## 2021-12-29 LAB — BASIC METABOLIC PANEL
Anion gap: 9 (ref 5–15)
BUN: 11 mg/dL (ref 8–23)
CO2: 26 mmol/L (ref 22–32)
Calcium: 9.7 mg/dL (ref 8.9–10.3)
Chloride: 104 mmol/L (ref 98–111)
Creatinine, Ser: 0.87 mg/dL (ref 0.44–1.00)
GFR, Estimated: >60 mL/min (ref >60)
Glucose, Bld: 82 mg/dL (ref 70–99)
Potassium: 3.9 mmol/L (ref 3.5–5.1)
Sodium: 139 mmol/L (ref 135–145)

## 2021-12-29 LAB — CBC
HCT: 42.6 % (ref 36.0–46.0)
Hemoglobin: 14.4 g/dL (ref 12.0–15.0)
MCH: 33.3 pg (ref 26.0–34.0)
MCHC: 33.8 g/dL (ref 30.0–36.0)
MCV: 98.6 fL (ref 80.0–100.0)
Platelets: 300 10*3/uL (ref 150–400)
RBC: 4.32 MIL/uL (ref 3.87–5.11)
RDW: 12.7 % (ref 11.5–15.5)
WBC: 5.6 10*3/uL (ref 4.0–10.5)
nRBC: 0 % (ref 0.0–0.2)

## 2021-12-29 SURGERY — BLEPHAROPLASTY
Anesthesia: General | Site: Eye | Laterality: Bilateral

## 2021-12-29 MED ORDER — AMISULPRIDE (ANTIEMETIC) 5 MG/2ML IV SOLN: 10.0000 mg | Freq: Once | INTRAVENOUS | Status: DC | PRN

## 2021-12-29 MED ORDER — MIDAZOLAM HCL 2 MG/2ML IJ SOLN
INTRAMUSCULAR | Status: DC | PRN
Start: 2021-12-29 — End: 2021-12-29
  Administered 2021-12-29: 1 mg via INTRAVENOUS

## 2021-12-29 MED ORDER — LIDOCAINE-EPINEPHRINE 1 %-1:100000 IJ SOLN
INTRAMUSCULAR | Status: DC | PRN
  Administered 2021-12-29: 16 mL

## 2021-12-29 MED ORDER — CHLORHEXIDINE GLUCONATE 0.12 % MT SOLN
OROMUCOSAL | Status: AC
  Filled 2021-12-29: qty 15

## 2021-12-29 MED ORDER — CEFAZOLIN SODIUM-DEXTROSE 2-4 GM/100ML-% IV SOLN
2.0000 g | INTRAVENOUS | Status: AC
  Administered 2021-12-29: 2 g via INTRAVENOUS

## 2021-12-29 MED ORDER — FENTANYL CITRATE (PF) 250 MCG/5ML IJ SOLN
INTRAMUSCULAR | Status: DC | PRN
  Administered 2021-12-29 (×3): 25 ug via INTRAVENOUS

## 2021-12-29 MED ORDER — LIDOCAINE 2% (20 MG/ML) 5 ML SYRINGE
INTRAMUSCULAR | Status: DC | PRN
  Administered 2021-12-29: 60 mg via INTRAVENOUS

## 2021-12-29 MED ORDER — OXYCODONE HCL 5 MG/5ML PO SOLN: 5.0000 mg | Freq: Once | ORAL | Status: DC | PRN

## 2021-12-29 MED ORDER — CHLORHEXIDINE GLUCONATE CLOTH 2 % EX PADS: 6.0000 | MEDICATED_PAD | Freq: Once | CUTANEOUS | Status: DC

## 2021-12-29 MED ORDER — MIDAZOLAM HCL 2 MG/2ML IJ SOLN
INTRAMUSCULAR | Status: AC
  Filled 2021-12-29: qty 2

## 2021-12-29 MED ORDER — ONDANSETRON HCL 4 MG/2ML IJ SOLN
INTRAMUSCULAR | Status: DC | PRN
  Administered 2021-12-29: 4 mg via INTRAVENOUS

## 2021-12-29 MED ORDER — BSS IO SOLN
INTRAOCULAR | Status: AC
  Filled 2021-12-29: qty 15

## 2021-12-29 MED ORDER — LIDOCAINE-EPINEPHRINE 1 %-1:100000 IJ SOLN
INTRAMUSCULAR | Status: AC
  Filled 2021-12-29: qty 1

## 2021-12-29 MED ORDER — PROPOFOL 10 MG/ML IV BOLUS
INTRAVENOUS | Status: AC
  Filled 2021-12-29: qty 20

## 2021-12-29 MED ORDER — CHLORHEXIDINE GLUCONATE 0.12 % MT SOLN
15.0000 mL | Freq: Once | OROMUCOSAL | Status: AC
  Administered 2021-12-29: 15 mL via OROMUCOSAL

## 2021-12-29 MED ORDER — FENTANYL CITRATE (PF) 100 MCG/2ML IJ SOLN: 25.0000 ug | INTRAMUSCULAR | Status: DC | PRN

## 2021-12-29 MED ORDER — LIDOCAINE 2% (20 MG/ML) 5 ML SYRINGE
INTRAMUSCULAR | Status: AC
  Filled 2021-12-29: qty 10

## 2021-12-29 MED ORDER — ORAL CARE MOUTH RINSE: 15.0000 mL | Freq: Once | OROMUCOSAL | Status: AC

## 2021-12-29 MED ORDER — ACETAMINOPHEN 500 MG PO TABS
500.0000 mg | ORAL_TABLET | Freq: Once | ORAL | Status: AC
  Administered 2021-12-29: 500 mg via ORAL

## 2021-12-29 MED ORDER — BSS IO SOLN
INTRAOCULAR | Status: DC | PRN
  Administered 2021-12-29: 15 mL

## 2021-12-29 MED ORDER — STERILE WATER FOR IRRIGATION IR SOLN
Status: DC | PRN
  Administered 2021-12-29: 600 mL

## 2021-12-29 MED ORDER — EPHEDRINE 5 MG/ML INJ
INTRAVENOUS | Status: AC
  Filled 2021-12-29: qty 5

## 2021-12-29 MED ORDER — PHENYLEPHRINE 40 MCG/ML (10ML) SYRINGE FOR IV PUSH (FOR BLOOD PRESSURE SUPPORT)
PREFILLED_SYRINGE | INTRAVENOUS | Status: AC
  Filled 2021-12-29: qty 30

## 2021-12-29 MED ORDER — DEXAMETHASONE SODIUM PHOSPHATE 10 MG/ML IJ SOLN
INTRAMUSCULAR | Status: AC
  Filled 2021-12-29: qty 2

## 2021-12-29 MED ORDER — ONDANSETRON HCL 4 MG/2ML IJ SOLN: 4.0000 mg | Freq: Once | INTRAMUSCULAR | Status: DC | PRN

## 2021-12-29 MED ORDER — CEFAZOLIN SODIUM-DEXTROSE 2-4 GM/100ML-% IV SOLN
INTRAVENOUS | Status: AC
  Filled 2021-12-29: qty 100

## 2021-12-29 MED ORDER — 0.9 % SODIUM CHLORIDE (POUR BTL) OPTIME
TOPICAL | Status: DC | PRN
  Administered 2021-12-29: 200 mL

## 2021-12-29 MED ORDER — LACTATED RINGERS IV SOLN: INTRAVENOUS | Status: DC

## 2021-12-29 MED ORDER — OXYCODONE HCL 5 MG PO TABS: 5.0000 mg | ORAL_TABLET | Freq: Once | ORAL | Status: DC | PRN

## 2021-12-29 MED ORDER — PROPOFOL 10 MG/ML IV BOLUS
INTRAVENOUS | Status: DC | PRN
  Administered 2021-12-29: 100 mg via INTRAVENOUS
  Administered 2021-12-29: 50 mg via INTRAVENOUS

## 2021-12-29 MED ORDER — FENTANYL CITRATE (PF) 250 MCG/5ML IJ SOLN
INTRAMUSCULAR | Status: AC
  Filled 2021-12-29: qty 5

## 2021-12-29 MED ORDER — ACETAMINOPHEN 500 MG PO TABS
ORAL_TABLET | ORAL | Status: AC
  Filled 2021-12-29: qty 2

## 2021-12-29 MED ORDER — ONDANSETRON HCL 4 MG/2ML IJ SOLN
INTRAMUSCULAR | Status: AC
  Filled 2021-12-29: qty 6

## 2021-12-29 MED ORDER — ACETAMINOPHEN 500 MG PO TABS: 1000.0000 mg | ORAL_TABLET | Freq: Once | ORAL | Status: DC

## 2021-12-29 MED ORDER — DEXAMETHASONE SODIUM PHOSPHATE 10 MG/ML IJ SOLN
INTRAMUSCULAR | Status: DC | PRN
  Administered 2021-12-29: 5 mg via INTRAVENOUS

## 2021-12-29 MED ORDER — ROCURONIUM BROMIDE 10 MG/ML (PF) SYRINGE
PREFILLED_SYRINGE | INTRAVENOUS | Status: AC
  Filled 2021-12-29: qty 10

## 2021-12-29 MED ORDER — EPHEDRINE SULFATE-NACL 50-0.9 MG/10ML-% IV SOSY
PREFILLED_SYRINGE | INTRAVENOUS | Status: DC | PRN
  Administered 2021-12-29 (×3): 5 mg via INTRAVENOUS

## 2021-12-29 SURGICAL SUPPLY — 31 items
BAG COUNTER SPONGE SURGICOUNT (BAG) ×2 IMPLANT
BLADE SURG 15 STRL LF DISP TIS (BLADE) ×1 IMPLANT
BLADE SURG 15 STRL SS (BLADE) ×2
CANISTER SUCT 3000ML PPV (MISCELLANEOUS) ×2 IMPLANT
CLEANER TIP ELECTROSURG 2X2 (MISCELLANEOUS) ×2 IMPLANT
COVER SURGICAL LIGHT HANDLE (MISCELLANEOUS) ×2 IMPLANT
ELECT COLORADO STRAIGHT 3 (ELECTROSURGICAL) ×2
ELECT NDL TIP 2.8 STRL (NEEDLE) ×1 IMPLANT
ELECT NEEDLE TIP 2.8 STRL (NEEDLE) IMPLANT
ELECT REM PT RETURN 9FT ADLT (ELECTROSURGICAL) ×2
ELECTRODE COLORADO STRAIGHT 3 (ELECTROSURGICAL) IMPLANT
ELECTRODE REM PT RTRN 9FT ADLT (ELECTROSURGICAL) ×1 IMPLANT
GLOVE SRG 8 PF TXTR STRL LF DI (GLOVE) ×1 IMPLANT
GLOVE SURG ENC MOIS LTX SZ6.5 (GLOVE) IMPLANT
GLOVE SURG ENC TEXT LTX SZ7.5 (GLOVE) ×2 IMPLANT
GLOVE SURG UNDER POLY LF SZ8 (GLOVE) ×2
GOWN STRL REUS W/ TWL LRG LVL3 (GOWN DISPOSABLE) ×3 IMPLANT
GOWN STRL REUS W/TWL LRG LVL3 (GOWN DISPOSABLE) ×6
KIT BASIN OR (CUSTOM PROCEDURE TRAY) ×2 IMPLANT
KIT TURNOVER KIT B (KITS) ×2 IMPLANT
MARKER SKIN DUAL TIP RULER LAB (MISCELLANEOUS) ×2 IMPLANT
NS IRRIG 1000ML POUR BTL (IV SOLUTION) ×2 IMPLANT
PAD ARMBOARD 7.5X6 YLW CONV (MISCELLANEOUS) ×4 IMPLANT
PENCIL BUTTON HOLSTER BLD 10FT (ELECTRODE) ×2 IMPLANT
PROTECTOR CORNEAL (OPHTHALMIC RELATED) ×4 IMPLANT
STRIP CLOSURE SKIN 1/2X4 (GAUZE/BANDAGES/DRESSINGS) ×2 IMPLANT
SUT PROLENE 6 0 P 1 18 (SUTURE) ×2 IMPLANT
TOWEL GREEN STERILE (TOWEL DISPOSABLE) ×2 IMPLANT
TOWEL GREEN STERILE FF (TOWEL DISPOSABLE) ×2 IMPLANT
TRAY ENT MC OR (CUSTOM PROCEDURE TRAY) ×2 IMPLANT
WATER STERILE IRR 1000ML POUR (IV SOLUTION) ×2 IMPLANT

## 2021-12-29 NOTE — Interval H&P Note (Signed)
History and Physical Interval Note: ? ?12/29/2021 ?11:48 AM ? ?Kara Warren  has presented today for surgery, with the diagnosis of Dermatochalasis of both upper eyelids.  The various methods of treatment have been discussed with the patient and family. After consideration of risks, benefits and other options for treatment, the patient has consented to  Procedure(s): ?BLEPHAROPLASTY (Bilateral) as a surgical intervention.  The patient's history has been reviewed, patient examined, no change in status, stable for surgery.  I have reviewed the patient's chart and labs.  Questions were answered to the patient's satisfaction.   ? ? ?Lennice Sites ? ? ?

## 2021-12-29 NOTE — Discharge Instructions (Signed)
1) Ice to eyelid(s) every 1 hour while awake for first 24 hours, may ice for additional days. 2) Elevate head of bed as much as possible for first 2-3 days to reduce swelling 3) Use Refresh PM ophthalmic ointment (or similar) in eye(s) before bed, may use gel or saline drops during the day  

## 2021-12-29 NOTE — Transfer of Care (Signed)
Immediate Anesthesia Transfer of Care Note ? ?Patient: Kara Warren ? ?Procedure(s) Performed: BLEPHAROPLASTY (Bilateral: Eye) ? ?Patient Location: PACU ? ?Anesthesia Type:General ? ?Level of Consciousness: drowsy ? ?Airway & Oxygen Therapy: Patient Spontanous Breathing and Patient connected to nasal cannula oxygen ? ?Post-op Assessment: Report given to RN and Post -op Vital signs reviewed and stable ? ?Post vital signs: Reviewed and stable ? ?Last Vitals:  ?Vitals Value Taken Time  ?BP 130/71 12/29/21 1344  ?Temp    ?Pulse 71 12/29/21 1346  ?Resp 14 12/29/21 1346  ?SpO2 98 % 12/29/21 1346  ?Vitals shown include unvalidated device data. ? ?Last Pain:  ?Vitals:  ? 12/29/21 0921  ?PainSc: 0-No pain  ?   ? ?  ? ?Complications: No notable events documented. ?

## 2021-12-29 NOTE — Anesthesia Procedure Notes (Signed)
Procedure Name: LMA Insertion ?Date/Time: 12/29/2021 12:13 PM ?Performed by: Imagene Riches, CRNA ?Pre-anesthesia Checklist: Patient identified, Emergency Drugs available, Suction available and Patient being monitored ?Patient Re-evaluated:Patient Re-evaluated prior to induction ?Oxygen Delivery Method: Circle System Utilized ?Preoxygenation: Pre-oxygenation with 100% oxygen ?Induction Type: IV induction ?Ventilation: Mask ventilation without difficulty ?LMA: LMA inserted ?LMA Size: 4.0 ?Number of attempts: 1 ?Airway Equipment and Method: Bite block ?Placement Confirmation: positive ETCO2 ?Tube secured with: Tape ?Dental Injury: Teeth and Oropharynx as per pre-operative assessment  ? ? ? ? ?

## 2021-12-29 NOTE — Op Note (Signed)
Operative Note  ? ?DATE OF OPERATION: 12/29/2021 ? ?SURGICAL DEPARTMENT: Plastic Surgery ? ?PREOPERATIVE DIAGNOSES: Dermatochalasis ? ?POSTOPERATIVE DIAGNOSES:  same ? ?PROCEDURE: Bilateral upper blepharoplasty ? ?SURGEON: Melene Plan. Zac Torti, MD ? ?ASSISTANT: None ? ?ANESTHESIA:  General.  ? ?COMPLICATIONS: None.  ? ?INDICATIONS FOR PROCEDURE:  ?The patient, Kara Warren is a 66 y.o. female born on [birth year], is here for treatment of bilateral upper eyelid dermatochalasis.  She noted visual impairment from upper eyelid skin.  She also had a visual field test which showed improvement of her visual impairment with taping of the eyelids. ?MRN: [id number] ? ?CONSENT:  ?Informed consent was obtained directly from the patient. Risks, benefits and alternatives were fully discussed. Specific risks including but not limited to bleeding, infection, hematoma, seroma, scarring, pain, contracture, asymmetry, wound healing problems, and need for further surgery were all discussed. The patient did have an ample opportunity to have questions answered to satisfaction.  ? ?DESCRIPTION OF PROCEDURE:  ?The patient was taken to the operating room. SCDs were placed and preop antibiotics were given.  General anesthesia was administered.  The patient's operative site was prepped and draped in a sterile fashion. A time out was performed and all information was confirmed to be correct.   ? ?The upper eyelids were prepped with 5% Betadine.  Calipers and ruler were used to mark the incision.  Corneal protectors with ophthalmic ointment were used throughout the case.  Following this 1% lidocaine with epinephrine was injected in each upper eyelid.  A 15 blade was used to make a skin incision.  Following this color to Bovie electrocautery was used to dissect through the upper incision to raise a skin muscle flap.  [place]ott scissors were used to further develop the skin muscle flap.  Following this [place]ott scissors were used to remove the right  side blepharoplasty skin muscle flap.  Gentle pressure on the eye along with a retractor were used to remove the white medial fat from the right upper eyelid.  Following this identical procedure was performed on the left side.  A similar amount of skin and fat were removed and electrocautery was used for hemostasis. ? ?After confirming hemostasis, the incisions were closed with a combination of running and interrupted 6-0 Prolene suture.  Corneal protectors were removed and the eyes were irrigated with BSS. ? ?The patient tolerated the procedure well.  There were no complications. The patient was allowed to wake from anesthesia, extubated and taken to the recovery room in satisfactory condition.   ?

## 2021-12-30 ENCOUNTER — Encounter (HOSPITAL_COMMUNITY): Payer: Self-pay | Admitting: Plastic Surgery

## 2021-12-30 NOTE — Anesthesia Postprocedure Evaluation (Signed)
Anesthesia Post Note ? ?Patient: Kara Warren ? ?Procedure(s) Performed: BLEPHAROPLASTY (Bilateral: Eye) ? ?  ? ?Patient location during evaluation: PACU ?Anesthesia Type: General ?Level of consciousness: awake and alert ?Pain management: pain level controlled ?Vital Signs Assessment: post-procedure vital signs reviewed and stable ?Respiratory status: spontaneous breathing, nonlabored ventilation, respiratory function stable and patient connected to nasal cannula oxygen ?Cardiovascular status: blood pressure returned to baseline and stable ?Postop Assessment: no apparent nausea or vomiting ?Anesthetic complications: no ? ? ?No notable events documented. ? ?Last Vitals:  ?Vitals:  ? 12/29/21 1400 12/29/21 1415  ?BP: 126/69 127/73  ?Pulse: 72 61  ?Resp: 16 14  ?Temp:    ?SpO2: 99% 97%  ?  ?Last Pain:  ?Vitals:  ? 12/29/21 1415  ?PainSc: 0-No pain  ? ? ?  ?  ?  ?  ?  ?  ? ?Adelena Desantiago ? ? ? ? ?

## 2022-01-02 ENCOUNTER — Ambulatory Visit: Payer: Medicare HMO | Admitting: Cardiovascular Disease

## 2022-01-02 ENCOUNTER — Other Ambulatory Visit (HOSPITAL_COMMUNITY): Payer: Self-pay

## 2022-01-02 ENCOUNTER — Encounter: Payer: Self-pay | Admitting: Cardiovascular Disease

## 2022-01-02 VITALS — BP 110/82 | HR 48 | Ht 64.0 in | Wt 146.4 lb

## 2022-01-02 DIAGNOSIS — I480 Paroxysmal atrial fibrillation: Secondary | ICD-10-CM

## 2022-01-02 MED ORDER — APIXABAN 5 MG PO TABS
5.0000 mg | ORAL_TABLET | Freq: Two times a day (BID) | ORAL | 11 refills | Status: DC
Start: 2022-01-02 — End: 2023-01-10
  Filled 2022-01-02: qty 60, 30d supply, fill #0
  Filled 2022-02-16: qty 60, 30d supply, fill #1
  Filled 2022-03-21: qty 60, 30d supply, fill #2
  Filled 2022-04-17: qty 60, 30d supply, fill #3
  Filled 2022-05-17: qty 60, 30d supply, fill #4
  Filled 2022-06-22: qty 60, 30d supply, fill #5
  Filled 2022-07-18: qty 60, 30d supply, fill #6
  Filled 2022-08-17: qty 60, 30d supply, fill #7
  Filled 2022-09-18: qty 60, 30d supply, fill #8
  Filled 2022-10-13: qty 60, 30d supply, fill #9
  Filled 2022-11-12: qty 60, 30d supply, fill #10
  Filled 2022-12-12: qty 60, 30d supply, fill #11

## 2022-01-02 MED ORDER — DILTIAZEM HCL 30 MG PO TABS
30.0000 mg | ORAL_TABLET | Freq: Two times a day (BID) | ORAL | 1 refills | Status: DC | PRN
  Filled 2022-01-02: qty 45, 23d supply, fill #0

## 2022-01-02 NOTE — Therapy (Signed)
?OUTPATIENT PHYSICAL THERAPY TREATMENT NOTE ? ? ?Patient Name: Kara Warren ?MRN: [id number] ?DOB:Feb 11, 1956, 66 y.o., female ?Today's Date: 01/03/2022 ? ?PCP: Isaac Bliss, Rayford Halsted, MD ?REFERRING PROVIDER: Isaac Bliss, Estel* ? ? PT End of Session - 01/03/22 1125   ? ? Visit Number 9   ? Number of Visits 10   ? Date for PT Re-Evaluation 01/23/22   ? Authorization Type AETNA MEDICARE   ? Progress Note Due on Visit 10   ? PT Start Time 1130   ? PT Stop Time 1210   ? PT Time Calculation (min) 40 min   ? Activity Tolerance Patient tolerated treatment well   ? Behavior During Therapy [facility] for tasks assessed/performed   ? ?  ?  ? ?  ? ? ? ? ? ? ? ? ? ?Past Medical History:  ?Diagnosis Date  ? Abdominal pain, left upper quadrant   ? Acute bronchospasm   ? Acute neck pain 07/02/2019  ? Acute sinusitis, unspecified 05/20/2009  ? Centricity Description: SINUSITIS - ACUTE-NOS Qualifier: Diagnosis of  By: Birdie Riddle MD, Belenda Cruise   Centricity Description: ACUTE SINUSITIS, UNSPECIFIED Qualifier: Diagnosis of  By: Alveta Heimlich MD, Cornelia Copa   Centricity Description: SINUSITIS, ACUTE Qualifier: Diagnosis of  By: Koleen Nimrod MD, Dellis Filbert    ? Anxiety   ? Atypical chest pain 03/05/2013  ? Body mass index (BMI) of 23.0-23.9 in adult   ? Cervicalgia   ? Colon polyp   ? Costochondral junction syndrome   ? Cough   ? DEPRESSION 08/25/2008  ? Qualifier: Diagnosis of  By: Ronnald Ramp CMA, Chemira    ? Diarrhea   ? Diverticulosis 07/27/2012  ? Dorsalgia   ? Dysrhythmia   ? hx a-fib  ? Elevated blood pressure reading without diagnosis of hypertension   ? Endometrial polyp 05/2007  ? benign  ? Essential hypertension 09/20/2020  ? Family history of osteoporosis 12/28/2011  ? Mother pt had normal Dexa 11/2009   ? Fatigue   ? GERD (gastroesophageal reflux disease)   ? HIP PAIN, RIGHT 04/12/2009  ? Qualifier: Diagnosis of  By: Oneida Alar MD, KARL    ? History of hiatal hernia   ? HYPERLIPIDEMIA 08/25/2008  ? Qualifier: Diagnosis of  By: Birdie Riddle MD, Belenda Cruise    ?  IBS (irritable bowel syndrome)   ? Left hip pain 07/30/2013  ? Left wrist pain 07/30/2013  ? Lumbar strain, initial encounter 07/28/2019  ? LUNG NODULE 11/19/2009  ? Neg CT    ? 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 Menopause   ? Migraine   ? Hx - Not current problem, Hormone related  ? NEUTROPENIA UNSPECIFIED 10/07/2008  ? Qualifier: Diagnosis of  By: Birdie Riddle MD, Belenda Cruise    ? [place] LUNG FIELD 11/19/2009  ? Qualifier: Diagnosis of  By: Alveta Heimlich MD, Cornelia Copa    ? Nonspecific (abnormal) findings on radiological and other examination of body structure 11/19/2009  ? Qualifier: Diagnosis of By: Alveta Heimlich MD, Bennie Dallas list entry automatically replaced. Please review for accuracy.  ? Otalgia, unspecified ear   ? Pain in throat   ? Palpitations   ? Paroxysmal atrial fibrillation (HCC)   ? Peroneal tendinitis of right lower extremity 03/12/2014  ? Plantar wart 04/02/2014  ? Pneumonia   ? RHINITIS 06/21/2009  ? Qualifier: Diagnosis of  By: Birdie Riddle MD, Belenda Cruise    ? Right ankle injury, subsequent encounter 01/11/2018  ? Right foot pain 08/09/2011  ? Sinusitis,  chronic   ? Skene's gland abscess 2009  ? patient unaware  ? Sprain of unspecified ligament of right ankle, subsequent encounter   ? TRANSAMINASES, SERUM, ELEVATED 12/07/2009  ? Qualifier: Diagnosis of  By: Inda Castle FNP, Wellington Hampshire   ? Unspecified cataract   ? Urinary tract infection, site not specified   ? Vitamin D deficiency   ? ?Past Surgical History:  ?Procedure Laterality Date  ? BROW LIFT Bilateral 12/29/2021  ? Procedure: BLEPHAROPLASTY;  Surgeon: Lennice Sites, MD;  Location: [place];  Service: Plastics;  Laterality: Bilateral;  ? COLONOSCOPY    ? x several  ? DILATION AND CURETTAGE OF UTERUS  05/2007  ? [place]  ? HYSTEROSCOPY WITH D & C N/A 03/30/2017  ? Procedure: DILATATION & CURETTAGE/HYSTEROSCOPY WITH ULTRASOUND GUIDANCE;  Surgeon: Megan Salon, MD;  Location: [place] [facility];  Service:  Gynecology;  Laterality: N/A;  ? UPPER GI ENDOSCOPY    ? uterine ablation  2007  ? ?Patient Active Problem List  ? Diagnosis Date Noted  ? Secondary hypercoagulable state ([place]) 09/26/2021  ? Colonoscopy causing post-procedural bleeding 09/20/2020  ? Essential hypertension 09/20/2020  ? AF (paroxysmal atrial fibrillation) ([place]) 09/20/2020  ? Plantar wart 04/02/2014  ? Peroneal tendinitis of right lower extremity 03/12/2014  ? Diverticulosis 07/27/2012  ? Sinusitis, chronic   ? Colon polyp   ? Menopause   ? Migraine   ? [place] LUNG FIELD 11/19/2009  ? HIP PAIN, RIGHT 04/12/2009  ? Hyperlipidemia 08/25/2008  ? Depression 08/25/2008  ? ? ?REFERRING DIAG: Neck pain ? ?THERAPY DIAG:  ?Cervicalgia ? ?Muscle weakness (generalized) ? ?Muscle spasm of back ? ?SUBJECTIVE: Patient reports she continues to do well. She had eye surgery so has not been as active. ? ?PAIN:  ?Are you having pain? ?NPRS scale: 0/10 ?Pain location: Neck, upper-mid-lower back ?Pain orientation: Bilateral ?PAIN TYPE: Chronic ?Pain description: Constant, tightness ?Aggravating factors: Nothing specific ?Relieving factors: OTC meds, heat, stretch, massage ? ?PATIENT GOALS: Improve feeling of neck/upper back tightness ? ? ?OBJECTIVE:  ?PATIENT SURVEYS:  ?FOTO 96% - 01/03/2022 (72% at evaluation) ? ?POSTURE:  ?Slightly rounded shoulder and forward head posture ? ?PALPATION: ?TTP bilateral upper trap, cervical paraspinals  ? ?CERVICAL AROM ? ?AROM AROM (deg) ?11/28/2021  ? ?12/12/2021  ?Flexion 50   ?Extension 40   ?Right lateral flexion 25 30  ?Left lateral flexion 20 30  ?Right rotation 45 50  ?Left rotation 50 50  ? ?Thoracic AROM grossly limited with feeling of tightness primarily in rotation and extension ? ?UE AROM/PROM: ?  UE AROM grossly WFL and non-painful  ? ?UE MMT: ? ?MMT Right ?11/28/2021 Left ?11/28/2021 Right / Left ?12/19/2021  ?Shoulder flexion '5 5 5 '$ / 5  ?Shoulder extension 4+ 4+ 5 / 5  ?Shoulder abduction 5 5    ?Shoulder internal rotation 5 5   ?Shoulder external rotation '5 5 5 '$ / 5  ?Middle trapezius 4 4- 4 / 4  ?Lower trapezius 4- 4- 4 / 4  ? ?FUNCTIONAL TESTS:  ?DNF endurance: 17 seconds ? ? ?TODAY'S TREATMENT:  ?01/03/2022: ? Therapeutic Exercise: ?UBE L1 x 4 min (2 fwd/bwd) ?Sidelying thoracic rotation x 10 each ?Supine thoracic extension mob with FR x 5 ?Supine scapular protraction/retraction on FR 2 x 10 ?Supine horizontal abduction with green on FR 2 x 10 ?Supine diagonals with green on FR x 10 each ?Supine cervical retraction on FR x 10 ?Cat cow x 10 ?Marina Gravel  dog 2 x 10 ?Cable lift / chop using FM 10# x 10 each ?Cable rotation using FM with bar attachment 7# x 10 each ?Cable split stance low row 10# x 10 each ?Modified side plank with top leg abduction 2 x 20 sec each ? ? ?12/26/2021: ? Therapeutic Exercise: ?UBE L1 x 4 min (2 fwd/bwd) ?Sidelying thoracic rotation x 5 each ?Sidelying banded thoracic rotation with blue x 5 each ?Supine thoracic extension mob with FR x 5 ?Cat cow x 5 ?Quadruped thoracic rotation x 5 each ?Modified side plank on knees with row using blue 2 x 10 each ?SL bridge with knee to chest 2 x 10 each ?Cable lift / chop using FM with bar attachment 7# 2 x 5 each ?Spring board bar pull down with yellow springs 2 x 10 ?Deadlift with 45# 2 x 10 ? ?12/19/2021: ? Therapeutic Exercise: ?UBE L1 x 4 min (2 fwd/bwd) ?Supine lumbar stretch x 3 each ?Sidelying thoracic rotation x 3 each ?Sidelying banded thoracic rotation with blue x 5 each ?Cat cow x 5 ?Bird dog x 10 ?Modified side plank on knees with clamshell x 10 each ?Modified side plank on knees with thread the needle x 5 each ?Dead bug x 10 ?SL bridge with knee to chest x 10 each ?Machine low row 25# x 10, 35# x 5 ?Pallof press with FM 10# x 10 each ?Spring board bar pull down with yellow springs x 10 ?Kneeling thoracic extension stretch at wall x 5 ?Deadlift with 45# 2 x 10 ? ?PATIENT EDUCATION:  ?Education details: HEP, FOTO ?Person educated:  Patient ?Education method: Explanation, Demonstration, Tactile cues, and Verbal cues ?Education comprehension: verbalized understanding, returned demonstration, verbal cues required, tactile cues required, and needs f

## 2022-01-02 NOTE — Progress Notes (Signed)
? ?Chief Complaint  ?Patient presents with  ? Follow-up  ?  Atrial fib, paroxysmal  ? ?History of Present Illness: 66 yo female with history of paroxysmal atrial fibrillation, depression, irritable bowel syndrome, hiatal hernia, GERD, hyperlipidemia and migraine headaches who is here today for cardiac follow up. She was seen as a new patient 08/20/19 with c/o palpitations. She was seen in the [facility] ED in May 2014 with c/o constant left sided chest pain. Troponin negative x 2. EKG without ischemic changes. I saw her in the office in 2014 after her ED visit. . Her pain was felt to be non-cardiac. Echo June 2014 with LVEF=55-60%, no valve disease. Exercise stress test 2014 with no ischemic EKG changes. With most recent palpitations, I arranged an echo and a cardiac monitor. Echo 09/04/19 showed GEZM=62-94%, grade 1 diastolic dysfunction. There was no significant valve disease. Cardiac monitor with atrial fibrillation, SVT and one 7 beat run of VT. She was started on Eliquis in January 2021 (CHADS VASC score of 2). She was seen in the atrial fibrillation clinic in February 2021 and Eliquis was stopped. She has remained on ASA. She was seen in the ED in [place] in November 2022 after having palpitations while driving. Sinus in the ED. She has had another episode of atrial fib documented on her Apple watch 2-3 months.  ? ?She is here today for follow up. The patient denies any chest pain, dyspnea, palpitations, lower extremity edema, orthopnea, PND, dizziness, near syncope or syncope.  ?  ?Primary Care Physician: Isaac Bliss, Rayford Halsted, MD ? ? ?Past Medical History:  ?Diagnosis Date  ? Abdominal pain, left upper quadrant   ? Acute bronchospasm   ? Acute neck pain 07/02/2019  ? Acute sinusitis, unspecified 05/20/2009  ? Centricity Description: SINUSITIS - ACUTE-NOS Qualifier: Diagnosis of  By: Birdie Riddle MD, Belenda Cruise   Centricity Description: ACUTE SINUSITIS, UNSPECIFIED Qualifier: Diagnosis of  By: Alveta Heimlich MD, Cornelia Copa    Centricity Description: SINUSITIS, ACUTE Qualifier: Diagnosis of  By: Koleen Nimrod MD, Dellis Filbert    ? Anxiety   ? Atypical chest pain 03/05/2013  ? Body mass index (BMI) of 23.0-23.9 in adult   ? Cervicalgia   ? Colon polyp   ? Costochondral junction syndrome   ? Cough   ? DEPRESSION 08/25/2008  ? Qualifier: Diagnosis of  By: Ronnald Ramp CMA, Chemira    ? Diarrhea   ? Diverticulosis 07/27/2012  ? Dorsalgia   ? Dysrhythmia   ? hx a-fib  ? Elevated blood pressure reading without diagnosis of hypertension   ? Endometrial polyp 05/2007  ? benign  ? Essential hypertension 09/20/2020  ? Family history of osteoporosis 12/28/2011  ? Mother pt had normal Dexa 11/2009   ? Fatigue   ? GERD (gastroesophageal reflux disease)   ? HIP PAIN, RIGHT 04/12/2009  ? Qualifier: Diagnosis of  By: Oneida Alar MD, KARL    ? History of hiatal hernia   ? HYPERLIPIDEMIA 08/25/2008  ? Qualifier: Diagnosis of  By: Birdie Riddle MD, Belenda Cruise    ? IBS (irritable bowel syndrome)   ? Left hip pain 07/30/2013  ? Left wrist pain 07/30/2013  ? Lumbar strain, initial encounter 07/28/2019  ? LUNG NODULE 11/19/2009  ? Neg CT    ? 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 Menopause   ? Migraine   ? Hx - Not current problem, Hormone related  ? NEUTROPENIA UNSPECIFIED 10/07/2008  ? Qualifier: Diagnosis of  By: Birdie Riddle MD, Belenda Cruise    ? [place]  ABN FINDING RAD & OTH EXAM LUNG FIELD 11/19/2009  ? Qualifier: Diagnosis of  By: Alveta Heimlich MD, Cornelia Copa    ? Nonspecific (abnormal) findings on radiological and other examination of body structure 11/19/2009  ? Qualifier: Diagnosis of By: Alveta Heimlich MD, Bennie Dallas list entry automatically replaced. Please review for accuracy.  ? Otalgia, unspecified ear   ? Pain in throat   ? Palpitations   ? Paroxysmal atrial fibrillation (HCC)   ? Peroneal tendinitis of right lower extremity 03/12/2014  ? Plantar wart 04/02/2014  ? Pneumonia   ? RHINITIS 06/21/2009  ? Qualifier: Diagnosis of  By: Birdie Riddle MD, Belenda Cruise    ? Right ankle  injury, subsequent encounter 01/11/2018  ? Right foot pain 08/09/2011  ? Sinusitis, chronic   ? Skene's gland abscess 2009  ? patient unaware  ? Sprain of unspecified ligament of right ankle, subsequent encounter   ? TRANSAMINASES, SERUM, ELEVATED 12/07/2009  ? Qualifier: Diagnosis of  By: Inda Castle FNP, Wellington Hampshire   ? Unspecified cataract   ? Urinary tract infection, site not specified   ? Vitamin D deficiency   ? ? ?Past Surgical History:  ?Procedure Laterality Date  ? BROW LIFT Bilateral 12/29/2021  ? Procedure: BLEPHAROPLASTY;  Surgeon: Lennice Sites, MD;  Location: [place];  Service: Plastics;  Laterality: Bilateral;  ? COLONOSCOPY    ? x several  ? DILATION AND CURETTAGE OF UTERUS  05/2007  ? [place]  ? HYSTEROSCOPY WITH D & C N/A 03/30/2017  ? Procedure: DILATATION & CURETTAGE/HYSTEROSCOPY WITH ULTRASOUND GUIDANCE;  Surgeon: Megan Salon, MD;  Location: [facility];  Service: Gynecology;  Laterality: N/A;  ? UPPER GI ENDOSCOPY    ? uterine ablation  2007  ? ? ?Current Outpatient Medications  ?Medication Sig Dispense Refill  ? apixaban (ELIQUIS) 5 MG TABS tablet Take 1 tablet (5 mg total) by mouth 2 (two) times daily. 60 tablet 11  ? azelastine (ASTELIN) 0.1 % nasal spray Place 1 spray into both nostrils daily as needed for allergies or rhinitis.    ? [place]-Glucosamine-Vit D (OSTEO BI-FLEX ONE PER DAY PO) Take 1 capsule by mouth daily.    ? Calcium Carbonate-Vitamin D (CALTRATE 600+D PO) Take 1 tablet by mouth daily.    ? citalopram (CELEXA) 20 MG tablet Take 1.5 tablets (30 mg total) by mouth daily. (Patient taking differently: Take 20 mg by mouth at bedtime.) 145 tablet 0  ? famotidine (PEPCID) 10 MG tablet Take 1 tablet (10 mg total) by mouth 2 (two) times daily. (Patient taking differently: Take 10 mg by mouth daily as needed for indigestion or heartburn.) 90 tablet 0  ? fluticasone (FLONASE) 50 MCG/ACT nasal spray Place 1-2 sprays into both nostrils daily. (Patient taking  differently: Place 1-2 sprays into both nostrils daily as needed for allergies or rhinitis.) 16 g 3  ? levocetirizine (XYZAL) 5 MG tablet Take 5 mg by mouth every evening.    ? metoprolol succinate (TOPROL-XL) 25 MG 24 hr tablet Take 1 tablet (25 mg total) by mouth daily. (Patient taking differently: Take 25 mg by mouth daily at 6 PM.) 90 tablet 1  ? Multiple Vitamins-Minerals (MULTI FOR HER 50+) TABS Take 1 tablet by mouth daily.    ? omeprazole (PRILOSEC) 20 MG capsule Take 1 capsule (20 mg total) by mouth in the morning 30 minutes before morning meal (Patient taking differently: Take 20 mg by mouth daily as needed (acid reflux).) 90 capsule 0  ? ondansetron (ZOFRAN-ODT)  4 MG disintegrating tablet Dissolve 1 tablet (4 mg total) by mouth every 8 (eight) hours as needed for nausea or vomiting. 20 tablet 0  ? Probiotic Product (PROBIOTIC PO) Take 1 capsule by mouth daily. Ultra Flora    ? vitamin C (ASCORBIC ACID) 500 MG tablet Take 500 mg by mouth daily.    ? zinc gluconate 50 MG tablet Take 50 mg by mouth daily.    ? diltiazem (CARDIZEM) 30 MG tablet TAKE 1 TABLET BY MOUTH 2 TIMES A DAY IF NEEDED FOR AFIB 45 tablet 1  ? ?No current facility-administered medications for this visit.  ? ? ?Allergies  ?Allergen Reactions  ? Augmentin [Amoxicillin-Pot Clavulanate] Diarrhea  ? ? ?Social History  ? ?Socioeconomic History  ? Marital status: Single  ?  Spouse name: Not on file  ? Number of children: 0  ? Years of education: Not on file  ? Highest education level: Not on file  ?Occupational History  ? Occupation: Therapist, sports  ?  Employer: [employer]  ?Tobacco Use  ? Smoking status: Former  ?  Packs/day: 0.10  ?  Years: 3.00  ?  Pack years: 0.30  ?  Types: Cigarettes  ?  Quit date: 10/02/1978  ?  Years since quitting: 43.2  ? Smokeless tobacco: Never  ?Vaping Use  ? Vaping Use: Never used  ?Substance and Sexual Activity  ? Alcohol use: Yes  ?  Alcohol/week: 7.0 standard drinks  ?  Types: 7 Glasses of wine per week  ?  Comment: per week   ? Drug use: No  ? Sexual activity: Not Currently  ?  Birth control/protection: Post-menopausal  ?Other Topics Concern  ? Not on file  ?Social History Narrative  ? Not on file  ? ?Social Determinants of Health  ? ?Financ

## 2022-01-02 NOTE — Patient Instructions (Signed)
Medication Instructions:  ?Your physician has recommended you make the following change in your medication:  ?1.) stop aspirin ?2.) start Eliquis 5 mg - one tablet twice daily ? ?*If you need a refill on your cardiac medications before your next appointment, please call your pharmacy* ? ? ?Lab Work: ?None ?If you have labs (blood work) drawn today and your tests are completely normal, you will receive your results only by: ?MyChart Message (if you have MyChart) OR ?A paper copy in the mail ?If you have any lab test that is abnormal or we need to change your treatment, we will call you to review the results. ? ? ?Testing/Procedures: ?None ? ? ?Follow-Up: ?At [facility], you and your health needs are our priority.  As part of our continuing mission to provide you with exceptional heart care, we have created designated Provider Care Teams.  These Care Teams include your primary Cardiologist (physician) and Advanced Practice Providers (APPs -  Physician Assistants and Nurse Practitioners) who all work together to provide you with the care you need, when you need it. ? ?Your next appointment:   ?12 month(s) ? ?The format for your next appointment:   ?In Person ? ?Provider:   ?Lauree Chandler, MD   ? ? ?Other Instructions ?  ?

## 2022-01-03 ENCOUNTER — Encounter: Payer: Self-pay | Admitting: Physical Therapy

## 2022-01-03 ENCOUNTER — Other Ambulatory Visit (HOSPITAL_COMMUNITY): Payer: Self-pay

## 2022-01-03 ENCOUNTER — Ambulatory Visit: Payer: Medicare HMO | Attending: Family Medicine | Admitting: Physical Therapy

## 2022-01-03 ENCOUNTER — Other Ambulatory Visit: Payer: Self-pay

## 2022-01-03 DIAGNOSIS — M62830 Muscle spasm of back: Secondary | ICD-10-CM

## 2022-01-03 DIAGNOSIS — M542 Cervicalgia: Secondary | ICD-10-CM | POA: Diagnosis not present

## 2022-01-03 DIAGNOSIS — M5459 Other low back pain: Secondary | ICD-10-CM | POA: Diagnosis not present

## 2022-01-03 DIAGNOSIS — M6281 Muscle weakness (generalized): Secondary | ICD-10-CM | POA: Diagnosis not present

## 2022-01-09 ENCOUNTER — Ambulatory Visit (INDEPENDENT_AMBULATORY_CARE_PROVIDER_SITE_OTHER): Payer: Medicare HMO | Admitting: Plastic Surgery

## 2022-01-09 DIAGNOSIS — H02834 Dermatochalasis of left upper eyelid: Secondary | ICD-10-CM

## 2022-01-09 DIAGNOSIS — H02831 Dermatochalasis of right upper eyelid: Secondary | ICD-10-CM

## 2022-01-09 NOTE — Progress Notes (Signed)
Status post bilateral upper blepharoplasty.  Happy with initial result. ? ?Physical exam ?Incisions clean dry intact, sutures intact ?Cornea clear ? ?Assessment and plan ?Doing well.  We will remove sutures and follow-up in a couple weeks for recheck. ?

## 2022-01-10 NOTE — Therapy (Signed)
?OUTPATIENT PHYSICAL THERAPY TREATMENT NOTE ? ? ?Progress Note ?Reporting Period 11/28/2021 to 01/11/2022 ? ?See note below for Objective Data and Assessment of Progress/Goals.  ? ? ? ?Patient Name: Kara Warren ?MRN: [id number] ?DOB:1956/03/09, 66 y.o., female ?Today's Date: 01/11/2022 ? ?PCP: Isaac Bliss, Rayford Halsted, MD ?REFERRING PROVIDER: Dene Gentry, MD ? ? PT End of Session - 01/11/22 1152   ? ? Visit Number 10   ? Number of Visits 14   ? Date for PT Re-Evaluation 02/08/22   ? Authorization Type [employer]ET[employer] MEDICARE   ? Progress Note Due on Visit 20   ? PT Start Time 1130   ? PT Stop Time 3729   ? PT Time Calculation (min) 45 min   ? Activity Tolerance Patient tolerated treatment well   ? Behavior During Therapy [facility] for tasks assessed/performed   ? ?  ?  ? ?  ? ? ? ? ? ? ? ? ? ? ?Past Medical History:  ?Diagnosis Date  ? Abdominal pain, left upper quadrant   ? Acute bronchospasm   ? Acute neck pain 07/02/2019  ? Acute sinusitis, unspecified 05/20/2009  ? Centricity Description: SINUSITIS - ACUTE-NOS Qualifier: Diagnosis of  By: Birdie Riddle MD, Belenda Cruise   Centricity Description: ACUTE SINUSITIS, UNSPECIFIED Qualifier: Diagnosis of  By: Alveta Heimlich MD, Cornelia Copa   Centricity Description: SINUSITIS, ACUTE Qualifier: Diagnosis of  By: Koleen Nimrod MD, Dellis Filbert    ? Anxiety   ? Atypical chest pain 03/05/2013  ? Body mass index (BMI) of 23.0-23.9 in adult   ? Cervicalgia   ? Colon polyp   ? Costochondral junction syndrome   ? Cough   ? DEPRESSION 08/25/2008  ? Qualifier: Diagnosis of  By: Ronnald Ramp CMA, Chemira    ? Diarrhea   ? Diverticulosis 07/27/2012  ? Dorsalgia   ? Dysrhythmia   ? hx a-fib  ? Elevated blood pressure reading without diagnosis of hypertension   ? Endometrial polyp 05/2007  ? benign  ? Essential hypertension 09/20/2020  ? Family history of osteoporosis 12/28/2011  ? Mother pt had normal Dexa 11/2009   ? Fatigue   ? GERD (gastroesophageal reflux disease)   ? HIP PAIN, RIGHT 04/12/2009  ? Qualifier: Diagnosis of  By:  Oneida Alar MD, KARL    ? History of hiatal hernia   ? HYPERLIPIDEMIA 08/25/2008  ? Qualifier: Diagnosis of  By: Birdie Riddle MD, Belenda Cruise    ? IBS (irritable bowel syndrome)   ? Left hip pain 07/30/2013  ? Left wrist pain 07/30/2013  ? Lumbar strain, initial encounter 07/28/2019  ? LUNG NODULE 11/19/2009  ? Neg CT    ? 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 Menopause   ? Migraine   ? Hx - Not current problem, Hormone related  ? NEUTROPENIA UNSPECIFIED 10/07/2008  ? Qualifier: Diagnosis of  By: Birdie Riddle MD, Belenda Cruise    ? [place] LUNG FIELD 11/19/2009  ? Qualifier: Diagnosis of  By: Alveta Heimlich MD, Cornelia Copa    ? Nonspecific (abnormal) findings on radiological and other examination of body structure 11/19/2009  ? Qualifier: Diagnosis of By: Alveta Heimlich MD, Bennie Dallas list entry automatically replaced. Please review for accuracy.  ? Otalgia, unspecified ear   ? Pain in throat   ? Palpitations   ? Paroxysmal atrial fibrillation (HCC)   ? Peroneal tendinitis of right lower extremity 03/12/2014  ? Plantar wart 04/02/2014  ? Pneumonia   ? RHINITIS 06/21/2009  ? Qualifier: Diagnosis of  By: Birdie Riddle MD, Belenda Cruise    ? Right ankle injury, subsequent encounter 01/11/2018  ? Right foot pain 08/09/2011  ? Sinusitis, chronic   ? Skene's gland abscess 2009  ? patient unaware  ? Sprain of unspecified ligament of right ankle, subsequent encounter   ? TRANSAMINASES, SERUM, ELEVATED 12/07/2009  ? Qualifier: Diagnosis of  By: Inda Castle FNP, Wellington Hampshire   ? Unspecified cataract   ? Urinary tract infection, site not specified   ? Vitamin D deficiency   ? ?Past Surgical History:  ?Procedure Laterality Date  ? BROW LIFT Bilateral 12/29/2021  ? Procedure: BLEPHAROPLASTY;  Surgeon: Lennice Sites, MD;  Location: [facility];  Service: Plastics;  Laterality: Bilateral;  ? COLONOSCOPY    ? x several  ? DILATION AND CURETTAGE OF UTERUS  05/2007  ? [place]  ? HYSTEROSCOPY WITH D & C N/A 03/30/2017  ?  Procedure: DILATATION & CURETTAGE/HYSTEROSCOPY WITH ULTRASOUND GUIDANCE;  Surgeon: Megan Salon, MD;  Location: [facility];  Service: Gynecology;  Laterality: N/A;  ? UPPER GI ENDOSCOPY    ? uterine ablation  2007  ? ?Patient Active Problem List  ? Diagnosis Date Noted  ? Secondary hypercoagulable state ([place]) 09/26/2021  ? Colonoscopy causing post-procedural bleeding 09/20/2020  ? Essential hypertension 09/20/2020  ? AF (paroxysmal atrial fibrillation) ([place]) 09/20/2020  ? Plantar wart 04/02/2014  ? Peroneal tendinitis of right lower extremity 03/12/2014  ? Diverticulosis 07/27/2012  ? Sinusitis, chronic   ? Colon polyp   ? Menopause   ? Migraine   ? [place] LUNG FIELD 11/19/2009  ? HIP PAIN, RIGHT 04/12/2009  ? Hyperlipidemia 08/25/2008  ? Depression 08/25/2008  ? ? ?REFERRING DIAG: Neck pain ? ?THERAPY DIAG:  ?Cervicalgia ? ?Muscle weakness (generalized) ? ?Muscle spasm of back ? ?SUBJECTIVE: Patient reports onset of right neck/upper back pain for past few days, especially when she turns her head to the left. It feels like a crook in her neck. ? ?PAIN:  ?Are you having pain? ?NPRS scale: 3/10 ?Pain location: Neck, upper-mid-lower back ?Pain orientation: Bilateral ?PAIN TYPE: Chronic ?Pain description: Constant, tightness ?Aggravating factors: Nothing specific ?Relieving factors: OTC meds, heat, stretch, massage ? ?PATIENT GOALS: Improve feeling of neck/upper back tightness ? ? ?OBJECTIVE:  ?PATIENT SURVEYS:  ?FOTO 96% - 01/03/2022 (72% at evaluation) ? ?POSTURE:  ?Slightly rounded shoulder and forward head posture ? ?PALPATION: ?TTP bilateral upper trap, cervical paraspinals  ? ?CERVICAL AROM ? ?AROM AROM (deg) ?11/28/2021  ?12/12/2021  ?01/11/2022  ?Flexion 50    ?Extension 40    ?Right lateral flexion 25 30   ?Left lateral flexion '20 30 25  '$ ?Right rotation 45 50   ?Left rotation 50 50 35  ? ?Thoracic AROM slightly limited with feeling of tightness primarily in rotation and extension ? ?UE  AROM/PROM: ?  UE AROM grossly WFL and non-painful  ? ?UE MMT: ? ?MMT Right ?11/28/2021 Left ?11/28/2021 Right / Left ?12/19/2021  ?Shoulder flexion '5 5 5 '$ / 5  ?Shoulder extension 4+ 4+ 5 / 5  ?Shoulder abduction 5 5   ?Shoulder internal rotation 5 5   ?Shoulder external rotation '5 5 5 '$ / 5  ?Middle trapezius 4 4- 4 / 4  ?Lower trapezius 4- 4- 4 / 4  ? ?FUNCTIONAL TESTS:  ?DNF endurance: 20 seconds - 01/11/2022 (17 seconds) ? ? ?TODAY'S TREATMENT:  ?01/11/2022: ? Therapeutic Exercise: ?UBE L1 x 4 min (2 fwd/bwd) ?Sidelying thoracic rotation x 10 each ?Supine  thoracic mobs with peanut ball and shoulder flexion/extension at various locations ?Supine chin tuck with peanut ball at suboccipital region ?Manual: ?Skilled palpation and monitoring of muscle tension while performing TPDN ?STM and MFR for right upper trap and levator region ?Trigger Point Dry Needling Treatment: ?Pre-treatment instruction: Patient instructed on dry needling rationale, procedures, and possible side effects including pain during treatment (achy,cramping feeling), bruising, drop of blood, lightheadedness, nausea, sweating. ?Patient Consent Given: Yes ?Education handout provided: Previously provided ?Muscles treated: Right upper trap and levator scap  ?Needle size and number: .30x61m x 4 and .25x347mx 2 ?Electrical stimulation performed: No ?Parameters: N/A ?Treatment response/outcome: Twitch response elicited, Palpable decrease in muscle tension, and patient reported reduced pain/tightness and improved cervical left rotation ?Post-treatment instructions: Patient instructed to expect possible mild to moderate muscle soreness later today and/or tomorrow. Patient instructed in methods to reduce muscle soreness and to continue prescribed HEP. If patient was dry needled over the lung field, patient was instructed on signs and symptoms of pneumothorax and, however unlikely, to see immediate medical attention should they occur. Patient was also educated on  signs and symptoms of infection and to seek medical attention should they occur. Patient verbalized understanding of these instructions and education. ? ? ?01/03/2022: ? Therapeutic Exercise: ?UBE L1 x 4

## 2022-01-11 ENCOUNTER — Other Ambulatory Visit: Payer: Self-pay

## 2022-01-11 ENCOUNTER — Ambulatory Visit: Payer: Medicare HMO | Admitting: Physical Therapy

## 2022-01-11 ENCOUNTER — Encounter: Payer: Self-pay | Admitting: Physical Therapy

## 2022-01-11 DIAGNOSIS — M62830 Muscle spasm of back: Secondary | ICD-10-CM

## 2022-01-11 DIAGNOSIS — M5459 Other low back pain: Secondary | ICD-10-CM | POA: Diagnosis not present

## 2022-01-11 DIAGNOSIS — M6281 Muscle weakness (generalized): Secondary | ICD-10-CM | POA: Diagnosis not present

## 2022-01-11 DIAGNOSIS — M542 Cervicalgia: Secondary | ICD-10-CM

## 2022-01-16 ENCOUNTER — Ambulatory Visit (INDEPENDENT_AMBULATORY_CARE_PROVIDER_SITE_OTHER): Payer: Medicare HMO | Admitting: Family Medicine

## 2022-01-16 ENCOUNTER — Encounter: Payer: Self-pay | Admitting: Cardiovascular Disease

## 2022-01-16 ENCOUNTER — Other Ambulatory Visit (HOSPITAL_COMMUNITY): Payer: Self-pay

## 2022-01-16 VITALS — BP 120/78 | Ht 64.0 in | Wt 140.0 lb

## 2022-01-16 DIAGNOSIS — M545 Low back pain, unspecified: Secondary | ICD-10-CM | POA: Diagnosis not present

## 2022-01-16 DIAGNOSIS — M542 Cervicalgia: Secondary | ICD-10-CM | POA: Diagnosis not present

## 2022-01-16 MED ORDER — BACLOFEN 10 MG PO TABS
10.0000 mg | ORAL_TABLET | Freq: Three times a day (TID) | ORAL | 1 refills | Status: DC | PRN
  Filled 2022-01-16: qty 60, 20d supply, fill #0

## 2022-01-16 NOTE — Progress Notes (Signed)
PCP: Isaac Bliss, Rayford Halsted, MD ? ?Subjective:  ? ?HPI: ?Patient is a 66 y.o. female here for neck pain and lower back pain. ? ?Patient has been participating in formal PT for her neck and upper back since her last visit. She felt like she was getting better, and was close to release from PT, when she had a recent setback. Over the past couple of weeks the stiffness and pain in her neck and down her bilateral traps has returned. She had no specific aggravating event. No weakness, tingling, pain or numbness in her arms or hands. She has taken Tylenol 800 mg in the morning and sometimes another 325 in the afternoon. Hasn't tried many topical therapies.  ? ?Patient also reports bilateral lower back pain, worse on the left today, that comes and goes but has been more severe over the last 7-10 days. It is an aching pain, not over the spine. Sometimes in lateral sacrum up through lumbar back. No radiating pain or neuropathy in her legs. No bladder or bowel control issues. No known injury. Has not been doing PT specifically for this issue.  ? ?Past Medical History:  ?Diagnosis Date  ? Abdominal pain, left upper quadrant   ? Acute bronchospasm   ? Acute neck pain 07/02/2019  ? Acute sinusitis, unspecified 05/20/2009  ? Centricity Description: SINUSITIS - ACUTE-NOS Qualifier: Diagnosis of  By: Birdie Riddle MD, Belenda Cruise   Centricity Description: ACUTE SINUSITIS, UNSPECIFIED Qualifier: Diagnosis of  By: Alveta Heimlich MD, Cornelia Copa   Centricity Description: SINUSITIS, ACUTE Qualifier: Diagnosis of  By: Koleen Nimrod MD, Dellis Filbert    ? Anxiety   ? Atypical chest pain 03/05/2013  ? Body mass index (BMI) of 23.0-23.9 in adult   ? Cervicalgia   ? Colon polyp   ? Costochondral junction syndrome   ? Cough   ? DEPRESSION 08/25/2008  ? Qualifier: Diagnosis of  By: Ronnald Ramp CMA, Chemira    ? Diarrhea   ? Diverticulosis 07/27/2012  ? Dorsalgia   ? Dysrhythmia   ? hx a-fib  ? Elevated blood pressure reading without diagnosis of hypertension   ? Endometrial  polyp 05/2007  ? benign  ? Essential hypertension 09/20/2020  ? Family history of osteoporosis 12/28/2011  ? Mother pt had normal Dexa 11/2009   ? Fatigue   ? GERD (gastroesophageal reflux disease)   ? HIP PAIN, RIGHT 04/12/2009  ? Qualifier: Diagnosis of  By: Oneida Alar MD, KARL    ? History of hiatal hernia   ? HYPERLIPIDEMIA 08/25/2008  ? Qualifier: Diagnosis of  By: Birdie Riddle MD, Belenda Cruise    ? IBS (irritable bowel syndrome)   ? Left hip pain 07/30/2013  ? Left wrist pain 07/30/2013  ? Lumbar strain, initial encounter 07/28/2019  ? LUNG NODULE 11/19/2009  ? Neg CT    ? 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 Menopause   ? Migraine   ? Hx - Not current problem, Hormone related  ? NEUTROPENIA UNSPECIFIED 10/07/2008  ? Qualifier: Diagnosis of  By: Birdie Riddle MD, Belenda Cruise    ? [place] LUNG FIELD 11/19/2009  ? Qualifier: Diagnosis of  By: Alveta Heimlich MD, Cornelia Copa    ? Nonspecific (abnormal) findings on radiological and other examination of body structure 11/19/2009  ? Qualifier: Diagnosis of By: Alveta Heimlich MD, Bennie Dallas list entry automatically replaced. Please review for accuracy.  ? Otalgia, unspecified ear   ? Pain in throat   ? Palpitations   ? Paroxysmal atrial fibrillation (  [place])   ? Peroneal tendinitis of right lower extremity 03/12/2014  ? Plantar wart 04/02/2014  ? Pneumonia   ? RHINITIS 06/21/2009  ? Qualifier: Diagnosis of  By: Birdie Riddle MD, Belenda Cruise    ? Right ankle injury, subsequent encounter 01/11/2018  ? Right foot pain 08/09/2011  ? Sinusitis, chronic   ? Skene's gland abscess 2009  ? patient unaware  ? Sprain of unspecified ligament of right ankle, subsequent encounter   ? TRANSAMINASES, SERUM, ELEVATED 12/07/2009  ? Qualifier: Diagnosis of  By: Inda Castle FNP, Wellington Hampshire   ? Unspecified cataract   ? Urinary tract infection, site not specified   ? Vitamin D deficiency   ? ? ?Current Outpatient Medications on File Prior to Visit  ?Medication Sig Dispense Refill  ?  apixaban (ELIQUIS) 5 MG TABS tablet Take 1 tablet (5 mg total) by mouth 2 (two) times daily. 60 tablet 11  ? azelastine (ASTELIN) 0.1 % nasal spray Place 1 spray into both nostrils daily as needed for allergies or rhinitis.    ? [place]-Glucosamine-Vit D (OSTEO BI-FLEX ONE PER DAY PO) Take 1 capsule by mouth daily.    ? Calcium Carbonate-Vitamin D (CALTRATE 600+D PO) Take 1 tablet by mouth daily.    ? citalopram (CELEXA) 20 MG tablet Take 1.5 tablets (30 mg total) by mouth daily. (Patient taking differently: Take 20 mg by mouth at bedtime.) 145 tablet 0  ? diltiazem (CARDIZEM) 30 MG tablet Take 1 tablet (30 mg total) by mouth 2 (two) times daily if needed for AFIB 45 tablet 1  ? famotidine (PEPCID) 10 MG tablet Take 1 tablet (10 mg total) by mouth 2 (two) times daily. (Patient taking differently: Take 10 mg by mouth daily as needed for indigestion or heartburn.) 90 tablet 0  ? fluticasone (FLONASE) 50 MCG/ACT nasal spray Place 1-2 sprays into both nostrils daily. (Patient taking differently: Place 1-2 sprays into both nostrils daily as needed for allergies or rhinitis.) 16 g 3  ? levocetirizine (XYZAL) 5 MG tablet Take 5 mg by mouth every evening.    ? metoprolol succinate (TOPROL-XL) 25 MG 24 hr tablet Take 1 tablet (25 mg total) by mouth daily. (Patient taking differently: Take 25 mg by mouth daily at 6 PM.) 90 tablet 1  ? Multiple Vitamins-Minerals (MULTI FOR HER 50+) TABS Take 1 tablet by mouth daily.    ? omeprazole (PRILOSEC) 20 MG capsule Take 1 capsule (20 mg total) by mouth in the morning 30 minutes before morning meal (Patient taking differently: Take 20 mg by mouth daily as needed (acid reflux).) 90 capsule 0  ? Probiotic Product (PROBIOTIC PO) Take 1 capsule by mouth daily. Ultra Flora    ? vitamin C (ASCORBIC ACID) 500 MG tablet Take 500 mg by mouth daily.    ? zinc gluconate 50 MG tablet Take 50 mg by mouth daily.    ? ?No current facility-administered medications on file prior to visit.  ? ? ?Past  Surgical History:  ?Procedure Laterality Date  ? BROW LIFT Bilateral 12/29/2021  ? Procedure: BLEPHAROPLASTY;  Surgeon: Lennice Sites, MD;  Location: [facility];  Service: Plastics;  Laterality: Bilateral;  ? COLONOSCOPY    ? x several  ? DILATION AND CURETTAGE OF UTERUS  05/2007  ? [place]  ? HYSTEROSCOPY WITH D & C N/A 03/30/2017  ? Procedure: DILATATION & CURETTAGE/HYSTEROSCOPY WITH ULTRASOUND GUIDANCE;  Surgeon: Megan Salon, MD;  Location: [facility];  Service: Gynecology;  Laterality: N/A;  ? UPPER  GI ENDOSCOPY    ? uterine ablation  2007  ? ? ?Allergies  ?Allergen Reactions  ? Augmentin [Amoxicillin-Pot Clavulanate] Diarrhea  ? ? ?BP 120/78   Ht '5\' 4"'$  (1.626 m)   Wt 140 lb (63.5 kg)   LMP 10/02/2006 (Approximate)   BMI 24.03 kg/m?  ? ? ?  08/30/2020  ?  9:03 AM 06/08/2021  ?  9:34 AM 08/22/2021  ? 10:20 AM 11/09/2021  ? 10:19 AM  ?[place] Adult Exercise  ?Frequency of aerobic exercise (# of days/week) '7 7 7 6  '$ ?Average time in minutes 60 60 60 60  ?Frequency of strengthening activities (# of days/week) '3 3 2 3  '$ ? ? ?   ? View : No data to display.  ?  ?  ?  ? ? ?    ?Objective:  ?Physical Exam: ? ?Gen: NAD, comfortable in exam room ?CV: Regular rate, well perfused ?Resp: No increased work of breathing, coughing or wheezing ?Psych: Normal mood and affect.  ?MSK: ?Neck - No bruising, swelling or deformity. No TTP. Good ROM in flexion and extension. Able to rotate to 50 degrees bilaterally. Negative Spurling's bilaterally.  ?Lower back - No bruising, swelling or deformity. Some mild TTP over the left lumbar paraspinal soft tissues. No midline tenderness.  No pain with back extension. Able to flex fully and place fingers on ground without pain. Negative logroll. Neurovascularly intact distally.  ?  ?Assessment & Plan:  ?1.  Neck and upper back pain - Continues to be consistent with underlying arthropathy of cervical and thoracic spine as well as trapezius, erector spinae overuse  strain. However, given the extended timeline of symptoms while also completing PT, we will evaluate further with an MRI of the cervical spine. Advised patient to try topical medications such as Voltaren gel or capsaici

## 2022-01-16 NOTE — Patient Instructions (Signed)
We will go ahead with an MRI of your cervical spine. ?Continue with physical therapy - we will add physical therapy for your lumbar strain as well. ?Continue tylenol. ?Consider topical capsaicin or voltaren. ?Baclofen up to 3 times a day as needed for muscle spasms - you can split in half and try at night first. ? ?

## 2022-01-17 ENCOUNTER — Encounter: Payer: Self-pay | Admitting: Family Medicine

## 2022-01-17 ENCOUNTER — Other Ambulatory Visit (HOSPITAL_COMMUNITY): Payer: Self-pay

## 2022-01-18 ENCOUNTER — Ambulatory Visit: Payer: Medicare HMO

## 2022-01-19 ENCOUNTER — Encounter: Payer: Self-pay | Admitting: Family Medicine

## 2022-01-23 ENCOUNTER — Encounter: Payer: Medicare HMO | Admitting: Physical Therapy

## 2022-01-24 ENCOUNTER — Other Ambulatory Visit (HOSPITAL_COMMUNITY): Payer: Self-pay

## 2022-01-24 ENCOUNTER — Other Ambulatory Visit: Payer: Self-pay | Admitting: Internal Medicine

## 2022-01-24 MED ORDER — CITALOPRAM HYDROBROMIDE 20 MG PO TABS
30.0000 mg | ORAL_TABLET | Freq: Every day | ORAL | 1 refills | Status: DC
  Filled 2022-01-24: qty 135, 90d supply, fill #0
  Filled 2022-04-20: qty 135, 90d supply, fill #1
  Filled 2022-10-05: qty 20, 13d supply, fill #2

## 2022-01-24 NOTE — Therapy (Signed)
?OUTPATIENT PHYSICAL THERAPY TREATMENT NOTE ? ? ? ?Patient Name: Kara Warren ?MRN: [id number] ?DOB:07-04-1956, 66 y.o., female ?Today's Date: 01/26/2022 ? ?PCP: Isaac Bliss, Rayford Halsted, MD ?REFERRING PROVIDER: Isaac Bliss, Estel* ? ? PT End of Session - 01/26/22 0920   ? ? Visit Number 11   ? Number of Visits 19   ? Date for PT Re-Evaluation 02/23/22   ? Authorization Type AETNA MEDICARE   ? Progress Note Due on Visit 20   ? PT Start Time 4970   ? PT Stop Time 1000   ? PT Time Calculation (min) 45 min   ? Activity Tolerance Patient tolerated treatment well   ? Behavior During Therapy [facility] for tasks assessed/performed   ? ?  ?  ? ?  ? ? ? ? ? ? ? ? ? ? ? ?Past Medical History:  ?Diagnosis Date  ? Abdominal pain, left upper quadrant   ? Acute bronchospasm   ? Acute neck pain 07/02/2019  ? Acute sinusitis, unspecified 05/20/2009  ? Centricity Description: SINUSITIS - ACUTE-NOS Qualifier: Diagnosis of  By: Birdie Riddle MD, Belenda Cruise   Centricity Description: ACUTE SINUSITIS, UNSPECIFIED Qualifier: Diagnosis of  By: Alveta Heimlich MD, Cornelia Copa   Centricity Description: SINUSITIS, ACUTE Qualifier: Diagnosis of  By: Koleen Nimrod MD, Dellis Filbert    ? Anxiety   ? Atypical chest pain 03/05/2013  ? Body mass index (BMI) of 23.0-23.9 in adult   ? Cervicalgia   ? Colon polyp   ? Costochondral junction syndrome   ? Cough   ? DEPRESSION 08/25/2008  ? Qualifier: Diagnosis of  By: Ronnald Ramp CMA, Chemira    ? Diarrhea   ? Diverticulosis 07/27/2012  ? Dorsalgia   ? Dysrhythmia   ? hx a-fib  ? Elevated blood pressure reading without diagnosis of hypertension   ? Endometrial polyp 05/2007  ? benign  ? Essential hypertension 09/20/2020  ? Family history of osteoporosis 12/28/2011  ? Mother pt had normal Dexa 11/2009   ? Fatigue   ? GERD (gastroesophageal reflux disease)   ? HIP PAIN, RIGHT 04/12/2009  ? Qualifier: Diagnosis of  By: Oneida Alar MD, KARL    ? History of hiatal hernia   ? HYPERLIPIDEMIA 08/25/2008  ? Qualifier: Diagnosis of  By: Birdie Riddle MD,  Belenda Cruise    ? IBS (irritable bowel syndrome)   ? Left hip pain 07/30/2013  ? Left wrist pain 07/30/2013  ? Lumbar strain, initial encounter 07/28/2019  ? LUNG NODULE 11/19/2009  ? Neg CT    ? 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 Menopause   ? Migraine   ? Hx - Not current problem, Hormone related  ? NEUTROPENIA UNSPECIFIED 10/07/2008  ? Qualifier: Diagnosis of  By: Birdie Riddle MD, Belenda Cruise    ? [place] LUNG FIELD 11/19/2009  ? Qualifier: Diagnosis of  By: Alveta Heimlich MD, Cornelia Copa    ? Nonspecific (abnormal) findings on radiological and other examination of body structure 11/19/2009  ? Qualifier: Diagnosis of By: Alveta Heimlich MD, Bennie Dallas list entry automatically replaced. Please review for accuracy.  ? Otalgia, unspecified ear   ? Pain in throat   ? Palpitations   ? Paroxysmal atrial fibrillation (HCC)   ? Peroneal tendinitis of right lower extremity 03/12/2014  ? Plantar wart 04/02/2014  ? Pneumonia   ? RHINITIS 06/21/2009  ? Qualifier: Diagnosis of  By: Birdie Riddle MD, Belenda Cruise    ? Right ankle injury, subsequent encounter 01/11/2018  ? Right foot pain 08/09/2011  ?  Sinusitis, chronic   ? Skene's gland abscess 2009  ? patient unaware  ? Sprain of unspecified ligament of right ankle, subsequent encounter   ? TRANSAMINASES, SERUM, ELEVATED 12/07/2009  ? Qualifier: Diagnosis of  By: Inda Castle FNP, Wellington Hampshire   ? Unspecified cataract   ? Urinary tract infection, site not specified   ? Vitamin D deficiency   ? ?Past Surgical History:  ?Procedure Laterality Date  ? BROW LIFT Bilateral 12/29/2021  ? Procedure: BLEPHAROPLASTY;  Surgeon: Lennice Sites, MD;  Location: [place];  Service: Plastics;  Laterality: Bilateral;  ? COLONOSCOPY    ? x several  ? DILATION AND CURETTAGE OF UTERUS  05/2007  ? [place]  ? HYSTEROSCOPY WITH D & C N/A 03/30/2017  ? Procedure: DILATATION & CURETTAGE/HYSTEROSCOPY WITH ULTRASOUND GUIDANCE;  Surgeon: Megan Salon, MD;  Location: [place]  [facility];  Service: Gynecology;  Laterality: N/A;  ? UPPER GI ENDOSCOPY    ? uterine ablation  2007  ? ?Patient Active Problem List  ? Diagnosis Date Noted  ? Secondary hypercoagulable state ([place]) 09/26/2021  ? Colonoscopy causing post-procedural bleeding 09/20/2020  ? Essential hypertension 09/20/2020  ? AF (paroxysmal atrial fibrillation) ([place]) 09/20/2020  ? Plantar wart 04/02/2014  ? Peroneal tendinitis of right lower extremity 03/12/2014  ? Diverticulosis 07/27/2012  ? Sinusitis, chronic   ? Colon polyp   ? Menopause   ? Migraine   ? [place] LUNG FIELD 11/19/2009  ? HIP PAIN, RIGHT 04/12/2009  ? Hyperlipidemia 08/25/2008  ? Depression 08/25/2008  ? ? ?REFERRING PROVIDER: Dene Gentry, MD ?REFERRING DIAG: Neck pain, Low back pain ? ?THERAPY DIAG:  ?Other low back pain ? ?Cervicalgia ? ?Muscle weakness (generalized) ? ?SUBJECTIVE: Patient reports her neck has improved overall but now her lower back is starting to tighten up on her so she saw the doctor and he ordered an MRI of her neck and ordered physical therapy for her lower back.  ? ?PAIN:  ?Are you having pain? ?NPRS scale: 3/10 ?Pain location: Lower back ?Pain orientation: Bilateral ?PAIN TYPE: Chronic ?Pain description: Constant, tightness ?Aggravating factors: Nothing specific ?Relieving factors: OTC meds, heat, stretch, massage ? ?PATIENT GOALS: Improve feeling of neck/upper/lower back tightness ? ? ?OBJECTIVE:  ?PATIENT SURVEYS:  ?FOTO (lumbar): 70% functional status ? ?POSTURE:  ?Slightly rounded shoulder and forward head posture ? ?PALPATION: ?Non tender to palpation this visit ? ?CERVICAL AROM ? ?AROM AROM (deg) ?11/28/2021  ?12/12/2021  ?01/11/2022  ?Flexion 50    ?Extension 40    ?Right lateral flexion 25 30   ?Left lateral flexion '20 30 25  '$ ?Right rotation 45 50   ?Left rotation 50 50 35  ? ?Thoracic AROM slightly limited with feeling of tightness primarily in rotation and extension ? ?LUMBAR ROM:  ? ?Active  A/PROM   ?01/26/2022  ?Flexion WFL  ?Extension WFL  ?Right lateral flexion WFL  ?Left lateral flexion WFL  ?Right rotation WFL  ?Left rotation WFL  ? ?UE MMT: ? ?MMT Right ?11/28/2021 Left ?11/28/2021 Right / Left ?12/19/2021  ?Shoulder flexion '5 5 5 '$ / 5  ?Shoulder extension 4+ 4+ 5 / 5  ?Shoulder abduction 5 5   ?Shoulder internal rotation 5 5   ?Shoulder external rotation '5 5 5 '$ / 5  ?Middle trapezius 4 4- 4 / 4  ?Lower trapezius 4- 4- 4 / 4  ? ?LE MMT: ? ?MMT Right ?01/26/2022 Left ?01/26/2022  ?Hip flexion 4 4  ?Hip extension  4- 4-  ?Hip abduction 4- 4-  ?Core 4-  ? ?FUNCTIONAL TESTS:  ?DNF endurance: 20 seconds - 01/26/2022 ? ? ?TODAY'S TREATMENT:  ?01/26/2022: ? Therapeutic Exercise: ?Piriformis stretch 2 x 30 sec each ?LTR in figure-4 position 5 x 5 sec each ?Windshield wipers 10 x 5 sec ?90-90 hip stretch 5 x 5 sec each ?SL bridge x 10 each ?Clamshell with blue x 15 each ?Dead bug x 10 ?Bird dog x 10 ?Cat cow 10 x 5 sec each ? ? ?01/11/2022: ? Therapeutic Exercise: ?UBE L1 x 4 min (2 fwd/bwd) ?Sidelying thoracic rotation x 10 each ?Supine thoracic mobs with peanut ball and shoulder flexion/extension at various locations ?Supine chin tuck with peanut ball at suboccipital region ?Manual: ?Skilled palpation and monitoring of muscle tension while performing TPDN ?STM and MFR for right upper trap and levator region ?Trigger Point Dry Needling Treatment: ?Pre-treatment instruction: Patient instructed on dry needling rationale, procedures, and possible side effects including pain during treatment (achy,cramping feeling), bruising, drop of blood, lightheadedness, nausea, sweating. ?Patient Consent Given: Yes ?Education handout provided: Previously provided ?Muscles treated: Right upper trap and levator scap  ?Needle size and number: .30x74m x 4 and .25x379mx 2 ?Electrical stimulation performed: No ?Parameters: N/A ?Treatment response/outcome: Twitch response elicited, Palpable decrease in muscle tension, and patient reported  reduced pain/tightness and improved cervical left rotation ?Post-treatment instructions: Patient instructed to expect possible mild to moderate muscle soreness later today and/or tomorrow. Patient instructed in methods to

## 2022-01-26 ENCOUNTER — Other Ambulatory Visit: Payer: Self-pay

## 2022-01-26 ENCOUNTER — Ambulatory Visit: Payer: Medicare HMO | Admitting: Physical Therapy

## 2022-01-26 ENCOUNTER — Encounter: Payer: Self-pay | Admitting: Physical Therapy

## 2022-01-26 DIAGNOSIS — M542 Cervicalgia: Secondary | ICD-10-CM | POA: Diagnosis not present

## 2022-01-26 DIAGNOSIS — M62830 Muscle spasm of back: Secondary | ICD-10-CM | POA: Diagnosis not present

## 2022-01-26 DIAGNOSIS — M5459 Other low back pain: Secondary | ICD-10-CM

## 2022-01-26 DIAGNOSIS — M6281 Muscle weakness (generalized): Secondary | ICD-10-CM | POA: Diagnosis not present

## 2022-01-26 NOTE — Patient Instructions (Signed)
Access Code: [email or web address] ?URL: [email or web address] ?Date: 01/26/2022 ?Prepared by: Hilda Blades ? ?Exercises ?- Sidelying Thoracic Lumbar Rotation  - 1 x daily - 10 reps - 5 seconds hold ?- Child's Pose Thoracic Rotation with Hand on Neck  - 1 x daily - 10 reps - 5 seconds hold ?- Thoracic Extension Mobilization on Foam Roll  - 1 x daily - 3 sets - 3-5 reps - 5 seconds hold ?- Standing 'L' Stretch at Counter  - 1 x daily - 10 reps - 5 seconds hold ?- Seated Passive Cervical Retraction  - 1 x daily - 7 x weekly - 3 sets - 10 reps - 3 hold ?- Seated Cervical Retraction and Rotation  - 1 x daily - 7 x weekly - 1 sets - 5 reps - 3 hold ?- Standing Cervical Retraction with Sidebending  - 1 x daily - 7 x weekly - 1 sets - 5 reps - 3 hold ?- Standing Shoulder Row with Anchored Resistance  - 1 x daily - 7 x weekly - 2-3 sets - 10 reps - 3 hold ?- Shoulder External Rotation and Scapular Retraction with Resistance  - 1 x daily - 7 x weekly - 2-3 sets - 10 reps - 3 hold ?- Shoulder extension with resistance - Neutral  - 1 x daily - 7 x weekly - 2-3 sets - 10 reps - 3 hold ?- Supine Piriformis Stretch with Foot on Ground  - 1 x daily - 3 reps - 30 seconds hold ?- Supine Hip Internal and External Rotation  - 1 x daily - 10 reps - 5 seconds hold ?- Supine Lower Trunk Rotation  - 1 x daily - 5 reps - 5 seconds hold ?- Supine Bridge with Knee to Chest  - 1 x daily - 2-3 sets - 10 reps ?- Clamshell with Resistance  - 1 x daily - 2-3 sets - 15 reps ?- Supine Dead Bug with Leg Extension  - 1 x daily - 2-3 sets - 10 reps ?- Bird Dog  - 1 x daily - 2-3 sets - 10 reps ?- Cat Cow  - 1 x daily - 10 reps - 5 seconds hold ?

## 2022-01-27 ENCOUNTER — Ambulatory Visit (INDEPENDENT_AMBULATORY_CARE_PROVIDER_SITE_OTHER): Payer: Medicare HMO | Admitting: Plastic Surgery

## 2022-01-27 DIAGNOSIS — H02831 Dermatochalasis of right upper eyelid: Secondary | ICD-10-CM

## 2022-01-27 DIAGNOSIS — H02834 Dermatochalasis of left upper eyelid: Secondary | ICD-10-CM

## 2022-01-27 NOTE — Progress Notes (Signed)
Patient is status post bilateral upper blepharoplasty.  She is doing well and happy with her initial result. ? ?Physical exam ?Incision clean dry and intact, symmetric good early result. ? ?Assessment and plan ?Patient doing well after blepharoplasty.  We can see her back about a year after her procedure or sooner if she is interested in any other treatment. ?

## 2022-01-30 NOTE — Therapy (Signed)
?OUTPATIENT PHYSICAL THERAPY TREATMENT NOTE ? ? ? ?Patient Name: Kara Warren ?MRN: [id number] ?DOB:08/13/1956, 66 y.o., female ?Today's Date: 01/31/2022 ? ?PCP: Isaac Bliss, Rayford Halsted, MD ?REFERRING PROVIDER: Isaac Bliss, Estel* ? ? PT End of Session - 01/31/22 1131   ? ? Visit Number 12   ? Number of Visits 19   ? Date for PT Re-Evaluation 02/23/22   ? Authorization Type AETNA MEDICARE   ? Progress Note Due on Visit 20   ? PT Start Time 1130   ? PT Stop Time 1210   ? PT Time Calculation (min) 40 min   ? Activity Tolerance Patient tolerated treatment well   ? Behavior During Therapy [facility] for tasks assessed/performed   ? ?  ?  ? ?  ? ? ? ? ? ? ? ? ? ? ? ? ?Past Medical History:  ?Diagnosis Date  ? Abdominal pain, left upper quadrant   ? Acute bronchospasm   ? Acute neck pain 07/02/2019  ? Acute sinusitis, unspecified 05/20/2009  ? Centricity Description: SINUSITIS - ACUTE-NOS Qualifier: Diagnosis of  By: Birdie Riddle MD, Belenda Cruise   Centricity Description: ACUTE SINUSITIS, UNSPECIFIED Qualifier: Diagnosis of  By: Alveta Heimlich MD, Cornelia Copa   Centricity Description: SINUSITIS, ACUTE Qualifier: Diagnosis of  By: Koleen Nimrod MD, Dellis Filbert    ? Anxiety   ? Atypical chest pain 03/05/2013  ? Body mass index (BMI) of 23.0-23.9 in adult   ? Cervicalgia   ? Colon polyp   ? Costochondral junction syndrome   ? Cough   ? DEPRESSION 08/25/2008  ? Qualifier: Diagnosis of  By: Ronnald Ramp CMA, Chemira    ? Diarrhea   ? Diverticulosis 07/27/2012  ? Dorsalgia   ? Dysrhythmia   ? hx a-fib  ? Elevated blood pressure reading without diagnosis of hypertension   ? Endometrial polyp 05/2007  ? benign  ? Essential hypertension 09/20/2020  ? Family history of osteoporosis 12/28/2011  ? Mother pt had normal Dexa 11/2009   ? Fatigue   ? GERD (gastroesophageal reflux disease)   ? HIP PAIN, RIGHT 04/12/2009  ? Qualifier: Diagnosis of  By: Oneida Alar MD, KARL    ? History of hiatal hernia   ? HYPERLIPIDEMIA 08/25/2008  ? Qualifier: Diagnosis of  By: Birdie Riddle MD,  Belenda Cruise    ? IBS (irritable bowel syndrome)   ? Left hip pain 07/30/2013  ? Left wrist pain 07/30/2013  ? Lumbar strain, initial encounter 07/28/2019  ? LUNG NODULE 11/19/2009  ? Neg CT    ? 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 Menopause   ? Migraine   ? Hx - Not current problem, Hormone related  ? NEUTROPENIA UNSPECIFIED 10/07/2008  ? Qualifier: Diagnosis of  By: Birdie Riddle MD, Belenda Cruise    ? [place] LUNG FIELD 11/19/2009  ? Qualifier: Diagnosis of  By: Alveta Heimlich MD, Cornelia Copa    ? Nonspecific (abnormal) findings on radiological and other examination of body structure 11/19/2009  ? Qualifier: Diagnosis of By: Alveta Heimlich MD, Bennie Dallas list entry automatically replaced. Please review for accuracy.  ? Otalgia, unspecified ear   ? Pain in throat   ? Palpitations   ? Paroxysmal atrial fibrillation (HCC)   ? Peroneal tendinitis of right lower extremity 03/12/2014  ? Plantar wart 04/02/2014  ? Pneumonia   ? RHINITIS 06/21/2009  ? Qualifier: Diagnosis of  By: Birdie Riddle MD, Belenda Cruise    ? Right ankle injury, subsequent encounter 01/11/2018  ? Right foot pain  08/09/2011  ? Sinusitis, chronic   ? Skene's gland abscess 2009  ? patient unaware  ? Sprain of unspecified ligament of right ankle, subsequent encounter   ? TRANSAMINASES, SERUM, ELEVATED 12/07/2009  ? Qualifier: Diagnosis of  By: Inda Castle FNP, Wellington Hampshire   ? Unspecified cataract   ? Urinary tract infection, site not specified   ? Vitamin D deficiency   ? ?Past Surgical History:  ?Procedure Laterality Date  ? BROW LIFT Bilateral 12/29/2021  ? Procedure: BLEPHAROPLASTY;  Surgeon: Lennice Sites, MD;  Location: [place];  Service: Plastics;  Laterality: Bilateral;  ? COLONOSCOPY    ? x several  ? DILATION AND CURETTAGE OF UTERUS  05/2007  ? [place]  ? HYSTEROSCOPY WITH D & C N/A 03/30/2017  ? Procedure: DILATATION & CURETTAGE/HYSTEROSCOPY WITH ULTRASOUND GUIDANCE;  Surgeon: Megan Salon, MD;  Location: [place]  [facility];  Service: Gynecology;  Laterality: N/A;  ? UPPER GI ENDOSCOPY    ? uterine ablation  2007  ? ?Patient Active Problem List  ? Diagnosis Date Noted  ? Secondary hypercoagulable state ([place]) 09/26/2021  ? Colonoscopy causing post-procedural bleeding 09/20/2020  ? Essential hypertension 09/20/2020  ? AF (paroxysmal atrial fibrillation) ([place]) 09/20/2020  ? Plantar wart 04/02/2014  ? Peroneal tendinitis of right lower extremity 03/12/2014  ? Diverticulosis 07/27/2012  ? Sinusitis, chronic   ? Colon polyp   ? Menopause   ? Migraine   ? [place] LUNG FIELD 11/19/2009  ? HIP PAIN, RIGHT 04/12/2009  ? Hyperlipidemia 08/25/2008  ? Depression 08/25/2008  ? ? ?REFERRING PROVIDER: Dene Gentry, MD ?REFERRING DIAG: Neck pain, Low back pain ? ?THERAPY DIAG:  ?Other low back pain ? ?Cervicalgia ? ?Muscle weakness (generalized) ? ?Muscle spasm of back ? ?SUBJECTIVE: Patient reports she is doing well, she has been consistent with the new exercises. ? ?PAIN:  ?Are you having pain? ?NPRS scale: 2/10 ?Pain location: Lower back ?Pain orientation: Bilateral ?PAIN TYPE: Chronic ?Pain description: Constant, tightness ?Aggravating factors: Nothing specific ?Relieving factors: OTC meds, heat, stretch, massage ? ?PATIENT GOALS: Improve feeling of neck/upper/lower back tightness ? ? ?OBJECTIVE:  ?PATIENT SURVEYS:  ?FOTO (lumbar): 70% functional status ? ?POSTURE:  ?Slightly rounded shoulder and forward head posture ? ?PALPATION: ?Non tender to palpation this visit ? ?CERVICAL AROM ? ?AROM AROM (deg) ?11/28/2021  ?12/12/2021  ?01/11/2022  ?Flexion 50    ?Extension 40    ?Right lateral flexion 25 30   ?Left lateral flexion '20 30 25  '$ ?Right rotation 45 50   ?Left rotation 50 50 35  ? ?Thoracic AROM slightly limited with feeling of tightness primarily in rotation and extension ? ?LUMBAR ROM:  ? ?Active  A/PROM  ?01/26/2022  ?Flexion WFL  ?Extension WFL  ?Right lateral flexion WFL  ?Left lateral flexion WFL  ?Right  rotation WFL  ?Left rotation WFL  ? ?UE MMT: ? ?MMT Right ?11/28/2021 Left ?11/28/2021 Right / Left ?12/19/2021  ?Shoulder flexion '5 5 5 '$ / 5  ?Shoulder extension 4+ 4+ 5 / 5  ?Shoulder abduction 5 5   ?Shoulder internal rotation 5 5   ?Shoulder external rotation '5 5 5 '$ / 5  ?Middle trapezius 4 4- 4 / 4  ?Lower trapezius 4- 4- 4 / 4  ? ?LE MMT: ? ?MMT Right ?01/26/2022 Left ?01/26/2022  ?Hip flexion 4 4  ?Hip extension 4- 4-  ?Hip abduction 4- 4-  ?Core 4-  ? ?FUNCTIONAL TESTS:  ?DNF endurance: 20 seconds -  01/26/2022 ? ? ?TODAY'S TREATMENT:  ?01/31/2022: ? Therapeutic Exercise: ?Recumbent bike L3 x 5 min while taking subjective ?Piriformis stretch 2 x 30 sec each ?LTR in figure-4 position 3 x 5 sec each ?Windshield wipers 5 x 5 sec ?90-90 hip stretch 3 x 5 sec each ?Sidelying thoracic rotation stretch x 10 each ?Hex bar deadlift 55# 3 x 10 ?Pallof press with red power band 2 x 10 each ?Split stance single arm low row FM 13# 2 x 10 each ?FM diagonal lift 10# x 8 each ?FM diagonal chop 10# x 8 each ? ? ?01/26/2022: ? Therapeutic Exercise: ?Piriformis stretch 2 x 30 sec each ?LTR in figure-4 position 5 x 5 sec each ?Windshield wipers 10 x 5 sec ?90-90 hip stretch 5 x 5 sec each ?SL bridge x 10 each ?Clamshell with blue x 15 each ?Dead bug x 10 ?Bird dog x 10 ?Cat cow 10 x 5 sec each ? ?01/11/2022: ? Therapeutic Exercise: ?UBE L1 x 4 min (2 fwd/bwd) ?Sidelying thoracic rotation x 10 each ?Supine thoracic mobs with peanut ball and shoulder flexion/extension at various locations ?Supine chin tuck with peanut ball at suboccipital region ?Manual: ?Skilled palpation and monitoring of muscle tension while performing TPDN ?STM and MFR for right upper trap and levator region ?Trigger Point Dry Needling Treatment: ?Pre-treatment instruction: Patient instructed on dry needling rationale, procedures, and possible side effects including pain during treatment (achy,cramping feeling), bruising, drop of blood, lightheadedness, nausea,  sweating. ?Patient Consent Given: Yes ?Education handout provided: Previously provided ?Muscles treated: Right upper trap and levator scap  ?Needle size and number: .30x1m x 4 and .25x328mx 2 ?Electrical stimulation p

## 2022-01-31 ENCOUNTER — Ambulatory Visit: Payer: Medicare HMO | Attending: Family Medicine | Admitting: Physical Therapy

## 2022-01-31 ENCOUNTER — Encounter: Payer: Self-pay | Admitting: Physical Therapy

## 2022-01-31 ENCOUNTER — Other Ambulatory Visit: Payer: Self-pay

## 2022-01-31 DIAGNOSIS — M5459 Other low back pain: Secondary | ICD-10-CM | POA: Diagnosis not present

## 2022-01-31 DIAGNOSIS — M62830 Muscle spasm of back: Secondary | ICD-10-CM | POA: Diagnosis not present

## 2022-01-31 DIAGNOSIS — M6281 Muscle weakness (generalized): Secondary | ICD-10-CM | POA: Diagnosis not present

## 2022-01-31 DIAGNOSIS — M542 Cervicalgia: Secondary | ICD-10-CM | POA: Diagnosis not present

## 2022-01-31 NOTE — Therapy (Incomplete)
?OUTPATIENT PHYSICAL THERAPY TREATMENT NOTE ? ? ? ?Patient Name: Kara Warren ?MRN: [id number] ?DOB:[birth year], 66 y.o., female ?Today's Date: 01/31/2022 ? ?PCP: Isaac Bliss, Rayford Halsted, MD ?REFERRING PROVIDER: Dene Gentry, MD ? ? ? ? ? ? ? ? ? ? ? ? ? ? ?Past Medical History:  ?Diagnosis Date  ? Abdominal pain, left upper quadrant   ? Acute bronchospasm   ? Acute neck pain 07/02/2019  ? Acute sinusitis, unspecified 05/20/2009  ? Centricity Description: SINUSITIS - ACUTE-NOS Qualifier: Diagnosis of  By: Birdie Riddle MD, Belenda Cruise   Centricity Description: ACUTE SINUSITIS, UNSPECIFIED Qualifier: Diagnosis of  By: Alveta Heimlich MD, Cornelia Copa   Centricity Description: SINUSITIS, ACUTE Qualifier: Diagnosis of  By: Koleen Nimrod MD, Dellis Filbert    ? Anxiety   ? Atypical chest pain 03/05/2013  ? Body mass index (BMI) of 23.0-23.9 in adult   ? Cervicalgia   ? Colon polyp   ? Costochondral junction syndrome   ? Cough   ? DEPRESSION 08/25/2008  ? Qualifier: Diagnosis of  By: Ronnald Ramp CMA, Chemira    ? Diarrhea   ? Diverticulosis 07/27/2012  ? Dorsalgia   ? Dysrhythmia   ? hx a-fib  ? Elevated blood pressure reading without diagnosis of hypertension   ? Endometrial polyp 05/2007  ? benign  ? Essential hypertension 09/20/2020  ? Family history of osteoporosis 12/28/2011  ? Mother pt had normal Dexa 11/2009   ? Fatigue   ? GERD (gastroesophageal reflux disease)   ? HIP PAIN, RIGHT 04/12/2009  ? Qualifier: Diagnosis of  By: Oneida Alar MD, KARL    ? History of hiatal hernia   ? HYPERLIPIDEMIA 08/25/2008  ? Qualifier: Diagnosis of  By: Birdie Riddle MD, Belenda Cruise    ? IBS (irritable bowel syndrome)   ? Left hip pain 07/30/2013  ? Left wrist pain 07/30/2013  ? Lumbar strain, initial encounter 07/28/2019  ? LUNG NODULE 11/19/2009  ? Neg CT    ? 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 Menopause   ? Migraine   ? Hx - Not current problem, Hormone related  ? NEUTROPENIA UNSPECIFIED 10/07/2008  ? Qualifier: Diagnosis of  By: Birdie Riddle MD, Belenda Cruise     ? [place] LUNG FIELD 11/19/2009  ? Qualifier: Diagnosis of  By: Alveta Heimlich MD, Cornelia Copa    ? Nonspecific (abnormal) findings on radiological and other examination of body structure 11/19/2009  ? Qualifier: Diagnosis of By: Alveta Heimlich MD, Bennie Dallas list entry automatically replaced. Please review for accuracy.  ? Otalgia, unspecified ear   ? Pain in throat   ? Palpitations   ? Paroxysmal atrial fibrillation (HCC)   ? Peroneal tendinitis of right lower extremity 03/12/2014  ? Plantar wart 04/02/2014  ? Pneumonia   ? RHINITIS 06/21/2009  ? Qualifier: Diagnosis of  By: Birdie Riddle MD, Belenda Cruise    ? Right ankle injury, subsequent encounter 01/11/2018  ? Right foot pain 08/09/2011  ? Sinusitis, chronic   ? Skene's gland abscess 2009  ? patient unaware  ? Sprain of unspecified ligament of right ankle, subsequent encounter   ? TRANSAMINASES, SERUM, ELEVATED 12/07/2009  ? Qualifier: Diagnosis of  By: Inda Castle FNP, Wellington Hampshire   ? Unspecified cataract   ? Urinary tract infection, site not specified   ? Vitamin D deficiency   ? ?Past Surgical History:  ?Procedure Laterality Date  ? BROW LIFT Bilateral 12/29/2021  ? Procedure: BLEPHAROPLASTY;  Surgeon: Lennice Sites, MD;  Location: [facility];  Service:  Plastics;  Laterality: Bilateral;  ? COLONOSCOPY    ? x several  ? DILATION AND CURETTAGE OF UTERUS  05/2007  ? [place]  ? HYSTEROSCOPY WITH D & C N/A 03/30/2017  ? Procedure: DILATATION & CURETTAGE/HYSTEROSCOPY WITH ULTRASOUND GUIDANCE;  Surgeon: Megan Salon, MD;  Location: [facility];  Service: Gynecology;  Laterality: N/A;  ? UPPER GI ENDOSCOPY    ? uterine ablation  2007  ? ?Patient Active Problem List  ? Diagnosis Date Noted  ? Secondary hypercoagulable state ([place]) 09/26/2021  ? Colonoscopy causing post-procedural bleeding 09/20/2020  ? Essential hypertension 09/20/2020  ? AF (paroxysmal atrial fibrillation) ([place]) 09/20/2020  ? Plantar wart 04/02/2014  ? Peroneal tendinitis of right  lower extremity 03/12/2014  ? Diverticulosis 07/27/2012  ? Sinusitis, chronic   ? Colon polyp   ? Menopause   ? Migraine   ? [place] LUNG FIELD 11/19/2009  ? HIP PAIN, RIGHT 04/12/2009  ? Hyperlipidemia 08/25/2008  ? Depression 08/25/2008  ? ? ?REFERRING PROVIDER: Dene Gentry, MD ?REFERRING DIAG: Neck pain, Low back pain ? ?THERAPY DIAG:  ?No diagnosis found. ? ?SUBJECTIVE: Patient reports she is doing well, she has been consistent with the new exercises. ? ?PAIN:  ?Are you having pain? ?NPRS scale: 2/10 ?Pain location: Lower back ?Pain orientation: Bilateral ?PAIN TYPE: Chronic ?Pain description: Constant, tightness ?Aggravating factors: Nothing specific ?Relieving factors: OTC meds, heat, stretch, massage ? ?PATIENT GOALS: Improve feeling of neck/upper/lower back tightness ? ? ?OBJECTIVE:  ?PATIENT SURVEYS:  ?FOTO (lumbar): 70% functional status ? ?POSTURE:  ?Slightly rounded shoulder and forward head posture ? ?PALPATION: ?Non tender to palpation this visit ? ?CERVICAL AROM ? ?AROM AROM (deg) ?11/28/2021  ?12/12/2021  ?01/11/2022  ?Flexion 50    ?Extension 40    ?Right lateral flexion 25 30   ?Left lateral flexion '20 30 25  '$ ?Right rotation 45 50   ?Left rotation 50 50 35  ? ?Thoracic AROM slightly limited with feeling of tightness primarily in rotation and extension ? ?LUMBAR ROM:  ? ?Active  A/PROM  ?01/26/2022  ?Flexion WFL  ?Extension WFL  ?Right lateral flexion WFL  ?Left lateral flexion WFL  ?Right rotation WFL  ?Left rotation WFL  ? ?UE MMT: ? ?MMT Right ?11/28/2021 Left ?11/28/2021 Right / Left ?12/19/2021  ?Shoulder flexion '5 5 5 '$ / 5  ?Shoulder extension 4+ 4+ 5 / 5  ?Shoulder abduction 5 5   ?Shoulder internal rotation 5 5   ?Shoulder external rotation '5 5 5 '$ / 5  ?Middle trapezius 4 4- 4 / 4  ?Lower trapezius 4- 4- 4 / 4  ? ?LE MMT: ? ?MMT Right ?01/26/2022 Left ?01/26/2022  ?Hip flexion 4 4  ?Hip extension 4- 4-  ?Hip abduction 4- 4-  ?Core 4-  ? ?FUNCTIONAL TESTS:  ?DNF  endurance: 20 seconds - 01/26/2022 ? ? ?TODAY'S TREATMENT:  ?02/02/2022: ? Therapeutic Exercise: ?Recumbent bike L3 x 5 min while taking subjective ?Piriformis stretch 2 x 30 sec each ?LTR in figure-4 position 3 x 5 sec each ?Windshield wipers 5 x 5 sec ?90-90 hip stretch 3 x 5 sec each ?Sidelying thoracic rotation stretch x 10 each ?Hex bar deadlift 55# 3 x 10 ?Pallof press with red power band 2 x 10 each ?Split stance single arm low row FM 13# 2 x 10 each ?FM diagonal lift 10# x 8 each ?FM diagonal chop 10# x 8 each ? ? ?01/31/2022: ? Therapeutic Exercise: ?Recumbent bike  L3 x 5 min while taking subjective ?Piriformis stretch 2 x 30 sec each ?LTR in figure-4 position 3 x 5 sec each ?Windshield wipers 5 x 5 sec ?90-90 hip stretch 3 x 5 sec each ?Sidelying thoracic rotation stretch x 10 each ?Hex bar deadlift 55# 3 x 10 ?Pallof press with red power band 2 x 10 each ?Split stance single arm low row FM 13# 2 x 10 each ?FM diagonal lift 10# x 8 each ?FM diagonal chop 10# x 8 each ? ?01/26/2022: ? Therapeutic Exercise: ?Piriformis stretch 2 x 30 sec each ?LTR in figure-4 position 5 x 5 sec each ?Windshield wipers 10 x 5 sec ?90-90 hip stretch 5 x 5 sec each ?SL bridge x 10 each ?Clamshell with blue x 15 each ?Dead bug x 10 ?Bird dog x 10 ?Cat cow 10 x 5 sec each ? ?PATIENT EDUCATION:  ?Education details: HEP ?Person educated: Patient ?Education method: Explanation, Demonstration, Tactile cues, and Verbal cues ?Education comprehension: verbalized understanding, returned demonstration, verbal cues required, tactile cues required, and needs further education ? ?HOME EXERCISE PROGRAM: ?Access Code: [email or web address] ? ? ?ASSESSMENT: ?CLINICAL IMPRESSION: ?Patient tolerated therapy well with no adverse effects. Therapy focused on continued mobility exercises and progression of strengthening. She tolerated increased weight and resistance for exercises well with no increase in pain. She did not initial hesitancy with lifting but was able to  perform correctly. She does require occasional cueing for proper core control. No changes to HEP this visit. Patient would benefit from continued skilled PT to reduce muscular pain and tightness and improve st

## 2022-02-02 ENCOUNTER — Ambulatory Visit: Payer: Medicare HMO | Admitting: Physical Therapy

## 2022-02-02 ENCOUNTER — Ambulatory Visit (HOSPITAL_BASED_OUTPATIENT_CLINIC_OR_DEPARTMENT_OTHER)
Admission: RE | Admit: 2022-02-02 | Discharge: 2022-02-02 | Disposition: A | Discharge status: Home / Self Care | Payer: Medicare HMO | Source: Ambulatory Visit | Attending: Family Medicine | Admitting: Family Medicine

## 2022-02-02 DIAGNOSIS — M4802 Spinal stenosis, cervical region: Secondary | ICD-10-CM | POA: Insufficient documentation

## 2022-02-02 DIAGNOSIS — R609 Edema, unspecified: Secondary | ICD-10-CM | POA: Insufficient documentation

## 2022-02-02 DIAGNOSIS — G8929 Other chronic pain: Secondary | ICD-10-CM | POA: Diagnosis not present

## 2022-02-02 DIAGNOSIS — M5021 Other cervical disc displacement,  high cervical region: Secondary | ICD-10-CM | POA: Insufficient documentation

## 2022-02-02 DIAGNOSIS — M542 Cervicalgia: Secondary | ICD-10-CM

## 2022-02-02 DIAGNOSIS — M2578 Osteophyte, vertebrae: Secondary | ICD-10-CM | POA: Insufficient documentation

## 2022-02-06 NOTE — Therapy (Signed)
?OUTPATIENT PHYSICAL THERAPY TREATMENT NOTE ? ? ? ?Patient Name: Kara Warren ?MRN: [id number] ?DOB:[birth year], 66 y.o., female ?Today's Date: 02/07/2022 ? ?PCP: Isaac Bliss, Rayford Halsted, MD ?REFERRING PROVIDER: Dene Gentry, MD ? ? PT End of Session - 02/07/22 1132   ? ? Visit Number 13   ? Number of Visits 19   ? Date for PT Re-Evaluation 02/23/22   ? Authorization Type [employer] MEDICARE   ? Progress Note Due on Visit 20   ? PT Start Time 1130   ? PT Stop Time 1210   ? PT Time Calculation (min) 40 min   ? Activity Tolerance Patient tolerated treatment well   ? Behavior During Therapy [facility] for tasks assessed/performed   ? ?  ?  ? ?  ? ? ? ? ? ? ? ? ? ? ? ? ? ?Past Medical History:  ?Diagnosis Date  ? Abdominal pain, left upper quadrant   ? Acute bronchospasm   ? Acute neck pain 07/02/2019  ? Acute sinusitis, unspecified 05/20/2009  ? Centricity Description: SINUSITIS - ACUTE-NOS Qualifier: Diagnosis of  By: Birdie Riddle MD, Belenda Cruise   Centricity Description: ACUTE SINUSITIS, UNSPECIFIED Qualifier: Diagnosis of  By: Alveta Heimlich MD, Cornelia Copa   Centricity Description: SINUSITIS, ACUTE Qualifier: Diagnosis of  By: Koleen Nimrod MD, Dellis Filbert    ? Anxiety   ? Atypical chest pain 03/05/2013  ? Body mass index (BMI) of 23.0-23.9 in adult   ? Cervicalgia   ? Colon polyp   ? Costochondral junction syndrome   ? Cough   ? DEPRESSION 08/25/2008  ? Qualifier: Diagnosis of  By: Ronnald Ramp CMA, Chemira    ? Diarrhea   ? Diverticulosis 07/27/2012  ? Dorsalgia   ? Dysrhythmia   ? hx a-fib  ? Elevated blood pressure reading without diagnosis of hypertension   ? Endometrial polyp 05/2007  ? benign  ? Essential hypertension 09/20/2020  ? Family history of osteoporosis 12/28/2011  ? Mother pt had normal Dexa 11/2009   ? Fatigue   ? GERD (gastroesophageal reflux disease)   ? HIP PAIN, RIGHT 04/12/2009  ? Qualifier: Diagnosis of  By: Oneida Alar MD, KARL    ? History of hiatal hernia   ? HYPERLIPIDEMIA 08/25/2008  ? Qualifier: Diagnosis of  By: Birdie Riddle MD,  Belenda Cruise    ? IBS (irritable bowel syndrome)   ? Left hip pain 07/30/2013  ? Left wrist pain 07/30/2013  ? Lumbar strain, initial encounter 07/28/2019  ? LUNG NODULE 11/19/2009  ? Neg CT    ? 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 Menopause   ? Migraine   ? Hx - Not current problem, Hormone related  ? NEUTROPENIA UNSPECIFIED 10/07/2008  ? Qualifier: Diagnosis of  By: Birdie Riddle MD, Belenda Cruise    ? [place] LUNG FIELD 11/19/2009  ? Qualifier: Diagnosis of  By: Alveta Heimlich MD, Cornelia Copa    ? Nonspecific (abnormal) findings on radiological and other examination of body structure 11/19/2009  ? Qualifier: Diagnosis of By: Alveta Heimlich MD, Bennie Dallas list entry automatically replaced. Please review for accuracy.  ? Otalgia, unspecified ear   ? Pain in throat   ? Palpitations   ? Paroxysmal atrial fibrillation (HCC)   ? Peroneal tendinitis of right lower extremity 03/12/2014  ? Plantar wart 04/02/2014  ? Pneumonia   ? RHINITIS 06/21/2009  ? Qualifier: Diagnosis of  By: Birdie Riddle MD, Belenda Cruise    ? Right ankle injury, subsequent encounter 01/11/2018  ? Right  foot pain 08/09/2011  ? Sinusitis, chronic   ? Skene's gland abscess 2009  ? patient unaware  ? Sprain of unspecified ligament of right ankle, subsequent encounter   ? TRANSAMINASES, SERUM, ELEVATED 12/07/2009  ? Qualifier: Diagnosis of  By: Inda Castle FNP, Wellington Hampshire   ? Unspecified cataract   ? Urinary tract infection, site not specified   ? Vitamin D deficiency   ? ?Past Surgical History:  ?Procedure Laterality Date  ? BROW LIFT Bilateral 12/29/2021  ? Procedure: BLEPHAROPLASTY;  Surgeon: Lennice Sites, MD;  Location: [facility];  Service: Plastics;  Laterality: Bilateral;  ? COLONOSCOPY    ? x several  ? DILATION AND CURETTAGE OF UTERUS  05/2007  ? [place]  ? HYSTEROSCOPY WITH D & C N/A 03/30/2017  ? Procedure: DILATATION & CURETTAGE/HYSTEROSCOPY WITH ULTRASOUND GUIDANCE;  Surgeon: Megan Salon, MD;  Location: [facility];  Service: Gynecology;  Laterality: N/A;  ? UPPER GI ENDOSCOPY    ? uterine ablation  2007  ? ?Patient Active Problem List  ? Diagnosis Date Noted  ? Secondary hypercoagulable state ([place]) 09/26/2021  ? Colonoscopy causing post-procedural bleeding 09/20/2020  ? Essential hypertension 09/20/2020  ? AF (paroxysmal atrial fibrillation) ([place]) 09/20/2020  ? Plantar wart 04/02/2014  ? Peroneal tendinitis of right lower extremity 03/12/2014  ? Diverticulosis 07/27/2012  ? Sinusitis, chronic   ? Colon polyp   ? Menopause   ? Migraine   ? [place] LUNG FIELD 11/19/2009  ? HIP PAIN, RIGHT 04/12/2009  ? Hyperlipidemia 08/25/2008  ? Depression 08/25/2008  ? ? ?REFERRING PROVIDER: Dene Gentry, MD ?REFERRING DIAG: Neck pain, Low back pain ? ?THERAPY DIAG:  ?Other low back pain ? ?Cervicalgia ? ?Muscle weakness (generalized) ? ?Muscle spasm of back ? ?SUBJECTIVE: Patient reports she is doing well, no new issues.  ? ?PAIN:  ?Are you having pain? ?NPRS scale: 0/10 ?Pain location: Lower back ?Pain orientation: Bilateral ?PAIN TYPE: Chronic ?Pain description: Constant, tightness ?Aggravating factors: Nothing specific ?Relieving factors: OTC meds, heat, stretch, massage ? ?PATIENT GOALS: Improve feeling of neck/upper/lower back tightness ? ? ?OBJECTIVE:  ?PATIENT SURVEYS:  ?FOTO (lumbar): 70% functional status ? ?POSTURE:  ?Slightly rounded shoulder and forward head posture ? ?PALPATION: ?Non tender to palpation this visit ? ?CERVICAL AROM ? ?AROM AROM (deg) ?11/28/2021  ?12/12/2021  ?01/11/2022  ?Flexion 50    ?Extension 40    ?Right lateral flexion 25 30   ?Left lateral flexion '20 30 25  '$ ?Right rotation 45 50   ?Left rotation 50 50 35  ? ?Thoracic AROM slightly limited with feeling of tightness primarily in rotation and extension ? ?LUMBAR ROM:  ? ?Active  A/PROM  ?01/26/2022  ?Flexion WFL  ?Extension WFL  ?Right lateral flexion WFL  ?Left lateral flexion WFL  ?Right rotation WFL  ?Left rotation WFL   ? ?UE MMT: ? ?MMT Right ?11/28/2021 Left ?11/28/2021 Right / Left ?12/19/2021  ?Shoulder flexion '5 5 5 '$ / 5  ?Shoulder extension 4+ 4+ 5 / 5  ?Shoulder abduction 5 5   ?Shoulder internal rotation 5 5   ?Shoulder external rotation '5 5 5 '$ / 5  ?Middle trapezius 4 4- 4 / 4  ?Lower trapezius 4- 4- 4 / 4  ? ?LE MMT: ? ?MMT Right ?01/26/2022 Left ?01/26/2022  ?Hip flexion 4 4  ?Hip extension 4- 4-  ?Hip abduction 4- 4-  ?Core 4-  ? ?FUNCTIONAL TESTS:  ?DNF endurance: 20 seconds - 01/26/2022 ? ? ?  TODAY'S TREATMENT:  ?02/07/2022: ? Therapeutic Exercise: ?Recumbent bike L3 x 5 min while taking subjective ?LTR with feet elevated x 10 each ?Sidelying thoracic rotation stretch x 10 each ?Cat cow x 10 ?Standing pigeon stretch 3 x 15 sec ?90-90 hip stretch 5 x 5 sec each ?Single leg bridge 3 x 8 ?Modified side plank with clamshell using blue 2 x 10 each ?Deadlift 45# kettlebell 3 x 10 ?Dead bug with physioball 2 x 10 ?Pallof press with red power band x 15 each ? ? ?01/31/2022: ? Therapeutic Exercise: ?Recumbent bike L3 x 5 min while taking subjective ?Piriformis stretch 2 x 30 sec each ?LTR in figure-4 position 3 x 5 sec each ?Windshield wipers 5 x 5 sec ?90-90 hip stretch 3 x 5 sec each ?Sidelying thoracic rotation stretch x 10 each ?Hex bar deadlift 55# 3 x 10 ?Pallof press with red power band 2 x 10 each ?Split stance single arm low row FM 13# 2 x 10 each ?FM diagonal lift 10# x 8 each ?FM diagonal chop 10# x 8 each ? ?01/26/2022: ? Therapeutic Exercise: ?Piriformis stretch 2 x 30 sec each ?LTR in figure-4 position 5 x 5 sec each ?Windshield wipers 10 x 5 sec ?90-90 hip stretch 5 x 5 sec each ?SL bridge x 10 each ?Clamshell with blue x 15 each ?Dead bug x 10 ?Bird dog x 10 ?Cat cow 10 x 5 sec each ? ?PATIENT EDUCATION:  ?Education details: HEP ?Person educated: Patient ?Education method: Explanation, Demonstration, Tactile cues, and Verbal cues ?Education comprehension: verbalized understanding, returned demonstration, verbal cues  required, tactile cues required, and needs further education ? ?HOME EXERCISE PROGRAM: ?Access Code: [email or web address] ? ? ?ASSESSMENT: ?CLINICAL IMPRESSION: ?Patient tolerated therapy well with no adverse effects. Therapy with

## 2022-02-07 ENCOUNTER — Encounter: Payer: Self-pay | Admitting: Physical Therapy

## 2022-02-07 ENCOUNTER — Ambulatory Visit: Payer: Medicare HMO | Admitting: Physical Therapy

## 2022-02-07 ENCOUNTER — Other Ambulatory Visit: Payer: Self-pay

## 2022-02-07 DIAGNOSIS — M542 Cervicalgia: Secondary | ICD-10-CM | POA: Diagnosis not present

## 2022-02-07 DIAGNOSIS — M6281 Muscle weakness (generalized): Secondary | ICD-10-CM | POA: Diagnosis not present

## 2022-02-07 DIAGNOSIS — M5459 Other low back pain: Secondary | ICD-10-CM

## 2022-02-07 DIAGNOSIS — M62830 Muscle spasm of back: Secondary | ICD-10-CM

## 2022-02-07 NOTE — Telephone Encounter (Signed)
Called and spoke to patient - see note attached to MRI. ?

## 2022-02-08 NOTE — Therapy (Signed)
?OUTPATIENT PHYSICAL THERAPY TREATMENT NOTE ? ? ? ?Patient Name: Kara Warren ?MRN: [id number] ?DOB:Jan 26, 1956, 66 y.o., female ?Today's Date: 02/09/2022 ? ?PCP: Isaac Bliss, Rayford Halsted, MD ?REFERRING PROVIDER: Dene Gentry, MD ? ? PT End of Session - 02/09/22 1136   ? ? Visit Number 14   ? Number of Visits 19   ? Date for PT Re-Evaluation 02/23/22   ? Authorization Type AETNA MEDICARE   ? Progress Note Due on Visit 20   ? PT Start Time 1130   ? PT Stop Time 1210   ? PT Time Calculation (min) 40 min   ? Activity Tolerance Patient tolerated treatment well   ? Behavior During Therapy [facility] for tasks assessed/performed   ? ?  ?  ? ?  ? ? ? ? ? ? ? ? ? ? ? ? ? ? ?Past Medical History:  ?Diagnosis Date  ? Abdominal pain, left upper quadrant   ? Acute bronchospasm   ? Acute neck pain 07/02/2019  ? Acute sinusitis, unspecified 05/20/2009  ? Centricity Description: SINUSITIS - ACUTE-NOS Qualifier: Diagnosis of  By: Birdie Riddle MD, Belenda Cruise   Centricity Description: ACUTE SINUSITIS, UNSPECIFIED Qualifier: Diagnosis of  By: Alveta Heimlich MD, Cornelia Copa   Centricity Description: SINUSITIS, ACUTE Qualifier: Diagnosis of  By: Koleen Nimrod MD, Dellis Filbert    ? Anxiety   ? Atypical chest pain 03/05/2013  ? Body mass index (BMI) of 23.0-23.9 in adult   ? Cervicalgia   ? Colon polyp   ? Costochondral junction syndrome   ? Cough   ? DEPRESSION 08/25/2008  ? Qualifier: Diagnosis of  By: Ronnald Ramp CMA, Chemira    ? Diarrhea   ? Diverticulosis 07/27/2012  ? Dorsalgia   ? Dysrhythmia   ? hx a-fib  ? Elevated blood pressure reading without diagnosis of hypertension   ? Endometrial polyp 05/2007  ? benign  ? Essential hypertension 09/20/2020  ? Family history of osteoporosis 12/28/2011  ? Mother pt had normal Dexa 11/2009   ? Fatigue   ? GERD (gastroesophageal reflux disease)   ? HIP PAIN, RIGHT 04/12/2009  ? Qualifier: Diagnosis of  By: Oneida Alar MD, KARL    ? History of hiatal hernia   ? HYPERLIPIDEMIA 08/25/2008  ? Qualifier: Diagnosis of  By: Birdie Riddle MD,  Belenda Cruise    ? IBS (irritable bowel syndrome)   ? Left hip pain 07/30/2013  ? Left wrist pain 07/30/2013  ? Lumbar strain, initial encounter 07/28/2019  ? LUNG NODULE 11/19/2009  ? Neg CT    ? 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 Menopause   ? Migraine   ? Hx - Not current problem, Hormone related  ? NEUTROPENIA UNSPECIFIED 10/07/2008  ? Qualifier: Diagnosis of  By: Birdie Riddle MD, Belenda Cruise    ? [place] LUNG FIELD 11/19/2009  ? Qualifier: Diagnosis of  By: Alveta Heimlich MD, Cornelia Copa    ? Nonspecific (abnormal) findings on radiological and other examination of body structure 11/19/2009  ? Qualifier: Diagnosis of By: Alveta Heimlich MD, Bennie Dallas list entry automatically replaced. Please review for accuracy.  ? Otalgia, unspecified ear   ? Pain in throat   ? Palpitations   ? Paroxysmal atrial fibrillation (HCC)   ? Peroneal tendinitis of right lower extremity 03/12/2014  ? Plantar wart 04/02/2014  ? Pneumonia   ? RHINITIS 06/21/2009  ? Qualifier: Diagnosis of  By: Birdie Riddle MD, Belenda Cruise    ? Right ankle injury, subsequent encounter 01/11/2018  ?  Right foot pain 08/09/2011  ? Sinusitis, chronic   ? Skene's gland abscess 2009  ? patient unaware  ? Sprain of unspecified ligament of right ankle, subsequent encounter   ? TRANSAMINASES, SERUM, ELEVATED 12/07/2009  ? Qualifier: Diagnosis of  By: Inda Castle FNP, [place]   ? Unspecified cataract   ? Urinary tract infection, site not specified   ? Vitamin D deficiency   ? ?Past Surgical History:  ?Procedure Laterality Date  ? BROW LIFT Bilateral 12/29/2021  ? Procedure: BLEPHAROPLASTY;  Surgeon: Lennice Sites, MD;  Location: [place];  Service: Plastics;  Laterality: Bilateral;  ? COLONOSCOPY    ? x several  ? DILATION AND CURETTAGE OF UTERUS  05/2007  ? [place]  ? HYSTEROSCOPY WITH D & C N/A 03/30/2017  ? Procedure: DILATATION & CURETTAGE/HYSTEROSCOPY WITH ULTRASOUND GUIDANCE;  Surgeon: Megan Salon, MD;  Location: [place]  [facility];  Service: Gynecology;  Laterality: N/A;  ? UPPER GI ENDOSCOPY    ? uterine ablation  2007  ? ?Patient Active Problem List  ? Diagnosis Date Noted  ? Secondary hypercoagulable state ([place]) 09/26/2021  ? Colonoscopy causing post-procedural bleeding 09/20/2020  ? Essential hypertension 09/20/2020  ? AF (paroxysmal atrial fibrillation) ([place]) 09/20/2020  ? Plantar wart 04/02/2014  ? Peroneal tendinitis of right lower extremity 03/12/2014  ? Diverticulosis 07/27/2012  ? Sinusitis, chronic   ? Colon polyp   ? Menopause   ? Migraine   ? [place] LUNG FIELD 11/19/2009  ? HIP PAIN, RIGHT 04/12/2009  ? Hyperlipidemia 08/25/2008  ? Depression 08/25/2008  ? ? ?REFERRING PROVIDER: Dene Gentry, MD ?REFERRING DIAG: Neck pain, Low back pain ? ?THERAPY DIAG:  ?Cervicalgia ? ?Other low back pain ? ?Muscle weakness (generalized) ? ?Muscle spasm of back ? ?SUBJECTIVE: Patient reports she is doing well, no new issues. Consistent with HEP. ? ?PAIN:  ?Are you having pain? ?NPRS scale: 0/10 ?Pain location: Lower back ?Pain orientation: Bilateral ?PAIN TYPE: Chronic ?Pain description: Constant, tightness ?Aggravating factors: Nothing specific ?Relieving factors: OTC meds, heat, stretch, massage ? ?PATIENT GOALS: Improve feeling of neck/upper/lower back tightness ? ? ?OBJECTIVE:  ?PATIENT SURVEYS:  ?FOTO (lumbar): 70% functional status ? ?POSTURE:  ?Slightly rounded shoulder and forward head posture ? ?PALPATION: ?Non tender to palpation this visit ? ?CERVICAL AROM ? ?AROM AROM (deg) ?11/28/2021  ?12/12/2021  ?01/11/2022  ?Flexion 50    ?Extension 40    ?Right lateral flexion 25 30   ?Left lateral flexion '20 30 25  '$ ?Right rotation 45 50   ?Left rotation 50 50 35  ? ?Thoracic AROM slightly limited with feeling of tightness primarily in rotation and extension ? ?LUMBAR ROM:  ? ?Active  A/PROM  ?01/26/2022  ?Flexion WFL  ?Extension WFL  ?Right lateral flexion WFL  ?Left lateral flexion WFL  ?Right rotation WFL   ?Left rotation WFL  ? ?UE MMT: ? ?MMT Right ?11/28/2021 Left ?11/28/2021 Right / Left ?12/19/2021  ?Shoulder flexion '5 5 5 '$ / 5  ?Shoulder extension 4+ 4+ 5 / 5  ?Shoulder abduction 5 5   ?Shoulder internal rotation 5 5   ?Shoulder external rotation '5 5 5 '$ / 5  ?Middle trapezius 4 4- 4 / 4  ?Lower trapezius 4- 4- 4 / 4  ? ?LE MMT: ? ?MMT Right ?01/26/2022 Left ?01/26/2022  ?Hip flexion 4 4  ?Hip extension 4- 4-  ?Hip abduction 4- 4-  ?Core 4-  ? ?FUNCTIONAL TESTS:  ?DNF endurance: 20  seconds - 01/26/2022 ? ? ?TODAY'S TREATMENT:  ?02/09/2022: ? Therapeutic Exercise: ?Elliptical L5 R1 x 5 min while taking subjective ?LTR figure-4 each x 10 each ?Hip switches x 10 each ?Qaudruped thoracic rotation x 10 each ?Thoracic extension over FR x 5 at various levels ?Cat cow x 10 ?Bird dog 2 x 10 ?Modified side plank with hip abduction hold 3 x 20 sec ?Split stance FM row 20# 2 x 10 each ?SL RDL with forward cone tap 2 x 10 each ?Kettlebell swing 15# 2 x 10 ?Pallof press side step with red power band x 10 each ? ? ?02/07/2022: ? Therapeutic Exercise: ?Recumbent bike L3 x 5 min while taking subjective ?LTR with feet elevated x 10 each ?Sidelying thoracic rotation stretch x 10 each ?Cat cow x 10 ?Standing pigeon stretch 3 x 15 sec ?90-90 hip stretch 5 x 5 sec each ?Single leg bridge 3 x 8 ?Modified side plank with clamshell using blue 2 x 10 each ?Deadlift 45# kettlebell 3 x 10 ?Dead bug with physioball 2 x 10 ?Pallof press with red power band x 15 each ? ?01/31/2022: ? Therapeutic Exercise: ?Recumbent bike L3 x 5 min while taking subjective ?Piriformis stretch 2 x 30 sec each ?LTR in figure-4 position 3 x 5 sec each ?Windshield wipers 5 x 5 sec ?90-90 hip stretch 3 x 5 sec each ?Sidelying thoracic rotation stretch x 10 each ?Hex bar deadlift 55# 3 x 10 ?Pallof press with red power band 2 x 10 each ?Split stance single arm low row FM 13# 2 x 10 each ?FM diagonal lift 10# x 8 each ?FM diagonal chop 10# x 8 each ? ?PATIENT EDUCATION:   ?Education details: HEP ?Person educated: Patient ?Education method: Explanation, Demonstration, Tactile cues, and Verbal cues ?Education comprehension: verbalized understanding, returned demonstration, verbal cues re

## 2022-02-09 ENCOUNTER — Encounter: Payer: Self-pay | Admitting: Physical Therapy

## 2022-02-09 ENCOUNTER — Ambulatory Visit: Payer: Medicare HMO | Admitting: Physical Therapy

## 2022-02-09 ENCOUNTER — Other Ambulatory Visit: Payer: Self-pay

## 2022-02-09 DIAGNOSIS — M5459 Other low back pain: Secondary | ICD-10-CM | POA: Diagnosis not present

## 2022-02-09 DIAGNOSIS — M542 Cervicalgia: Secondary | ICD-10-CM | POA: Diagnosis not present

## 2022-02-09 DIAGNOSIS — M62830 Muscle spasm of back: Secondary | ICD-10-CM

## 2022-02-09 DIAGNOSIS — M6281 Muscle weakness (generalized): Secondary | ICD-10-CM | POA: Diagnosis not present

## 2022-02-14 NOTE — Therapy (Addendum)
OUTPATIENT PHYSICAL THERAPY TREATMENT NOTE  DISCHARGE    Patient Name: Kara Warren MRN: [id number] DOB:May 29, 1956, 66 y.o., female Today's Date: 02/15/2022  PCP: Isaac Bliss, Rayford Halsted, MD REFERRING PROVIDER: Dene Gentry, MD    PT End of Session - 02/15/22 1005     Visit Number 15    Number of Visits 19    Date for PT Re-Evaluation 02/23/22    Authorization Type AETNA MEDICARE    Progress Note Due on Visit 20    PT Start Time 1000    PT Stop Time 1045    PT Time Calculation (min) 45 min    Activity Tolerance Patient tolerated treatment well    Behavior During Therapy [facility] for tasks assessed/performed                          Past Medical History:  Diagnosis Date   Abdominal pain, left upper quadrant    Acute bronchospasm    Acute neck pain 07/02/2019   Acute sinusitis, unspecified 05/20/2009   Centricity Description: SINUSITIS - ACUTE-NOS Qualifier: Diagnosis of  By: Birdie Riddle MD, Belenda Cruise   Centricity Description: ACUTE SINUSITIS, UNSPECIFIED Qualifier: Diagnosis of  By: Alveta Heimlich MD, [place]   Centricity Description: SINUSITIS, ACUTE Qualifier: Diagnosis of  By: Koleen Nimrod MD, Dellis Filbert     Anxiety    Atypical chest pain 03/05/2013   Body mass index (BMI) of 23.0-23.9 in adult    Cervicalgia    Colon polyp    Costochondral junction syndrome    Cough    DEPRESSION 08/25/2008   Qualifier: Diagnosis of  By: Ronnald Ramp CMA, Chemira     Diarrhea    Diverticulosis 07/27/2012   Dorsalgia    Dysrhythmia    hx a-fib   Elevated blood pressure reading without diagnosis of hypertension    Endometrial polyp 05/2007   benign   Essential hypertension 09/20/2020   Family history of osteoporosis 12/28/2011   Mother pt had normal Dexa 11/2009    Fatigue    GERD (gastroesophageal reflux disease)    HIP PAIN, RIGHT 04/12/2009   Qualifier: Diagnosis of  By: Oneida Alar MD, KARL     History of hiatal hernia    HYPERLIPIDEMIA 08/25/2008   Qualifier: Diagnosis of  By:  Birdie Riddle MD, Belenda Cruise     IBS (irritable bowel syndrome)    Left hip pain 07/30/2013   Left wrist pain 07/30/2013   Lumbar strain, initial encounter 07/28/2019   LUNG NODULE 11/19/2009   Neg CT     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 Please review for accuracy.   Otalgia, unspecified ear    Pain in throat    Palpitations    Paroxysmal atrial fibrillation (HCC)    Peroneal tendinitis of right lower extremity 03/12/2014   Plantar wart 04/02/2014   Pneumonia    RHINITIS 06/21/2009   Qualifier: Diagnosis of  By: Birdie Riddle MD, Belenda Cruise     Right ankle injury, subsequent  encounter 01/11/2018   Right foot pain 08/09/2011   Sinusitis, chronic    Skene's gland abscess 2009   patient unaware   Sprain of unspecified ligament of right ankle, subsequent encounter    TRANSAMINASES, SERUM, ELEVATED 12/07/2009   Qualifier: Diagnosis of  By: Inda Castle FNP, Melissa S    Unspecified cataract    Urinary tract infection, site not specified    Vitamin D deficiency    Past Surgical History:  Procedure Laterality Date   BROW LIFT Bilateral 12/29/2021   Procedure: BLEPHAROPLASTY;  Surgeon: Lennice Sites, MD;  Location: [place];  Service: Plastics;  Laterality: Bilateral;   COLONOSCOPY     x several   DILATION AND CURETTAGE OF UTERUS  05/2007   GYNECOLOGIC CRYOSURGERY  1980   HYSTEROSCOPY WITH D & C N/A 03/30/2017   Procedure: DILATATION & CURETTAGE/HYSTEROSCOPY WITH ULTRASOUND GUIDANCE;  Surgeon: Megan Salon, MD;   Location: [place] [facility];  Service: Gynecology;  Laterality: N/A;   UPPER GI ENDOSCOPY     uterine ablation  2007   Patient Active Problem List   Diagnosis Date Noted   Secondary hypercoagulable state ([place]) 09/26/2021   Colonoscopy causing post-procedural bleeding 09/20/2020   Essential hypertension 09/20/2020   AF (paroxysmal atrial fibrillation) ([place]) 09/20/2020   Plantar wart 04/02/2014   Peroneal tendinitis of right lower extremity 03/12/2014   Diverticulosis 07/27/2012   Sinusitis, chronic    Colon polyp    Menopause    Migraine    NONSPCIFC ABN FINDING RAD & OTH EXAM LUNG FIELD 11/19/2009   HIP PAIN, RIGHT 04/12/2009   Hyperlipidemia 08/25/2008   Depression 08/25/2008    REFERRING PROVIDER: Dene Gentry, MD REFERRING DIAG: Neck pain, Low back pain  THERAPY DIAG:  Other low back pain  Cervicalgia  Muscle weakness (generalized)  Muscle spasm of back  SUBJECTIVE: Patient her left lower back and hip region have been more sore and then today she felt a pop in her lower back when leaning forward which cause even more pain in the left side. She feels the left lower back, hip, and pain into the groin and front of the thigh.  PAIN:  Are you having pain? NPRS scale: 6-7/10 Pain location: Lower back Pain orientation: Bilateral (left > right) PAIN TYPE: Chronic Pain description: Constant, tightness Aggravating factors: Nothing specific Relieving factors: OTC meds, heat, stretch, massage  PATIENT GOALS: Improve feeling of neck/upper/lower back tightness   OBJECTIVE:  PATIENT SURVEYS:  FOTO (lumbar): 70% functional status  POSTURE:  Slightly rounded shoulder and forward head posture  PALPATION: Non tender to palpation this visit  CERVICAL AROM  AROM AROM (deg) 11/28/2021  12/12/2021  01/11/2022  Flexion 50    Extension 40    Right lateral flexion 25 30   Left lateral flexion $RemoveBeforeD'20 30 25  'sesCMZyiSeaMRy$ Right rotation 45 50   Left rotation 50 50 35   Thoracic AROM slightly  limited with feeling of tightness primarily in rotation and extension  LUMBAR ROM:   Active  A/PROM  01/26/2022  Flexion WFL  Extension [facility]  Right lateral flexion WFL  Left lateral flexion WFL  Right rotation [facility]  Left rotation [facility]   UE MMT:  MMT Right 11/28/2021 Left 11/28/2021 Right / Left 12/19/2021  Shoulder flexion $RemoveBeforeDE'5 5 5 'xmPnCnHmcvrUKEj$ / 5  Shoulder extension 4+ 4+ 5 / 5  Shoulder abduction 5 5   Shoulder internal rotation 5 5   Shoulder external rotation $RemoveBeforeDEI'5 5 5 'UaPuejiVgpRfnoRk$ / 5  Middle trapezius 4 4-  4 / 4  Lower trapezius 4- 4- 4 / 4   LE MMT:  MMT Right 01/26/2022 Left 01/26/2022  Hip flexion 4 4  Hip extension 4- 4-  Hip abduction 4- 4-  Core 4-   FUNCTIONAL TESTS:  DNF endurance: 20 seconds - 01/26/2022   TODAY'S TREATMENT:  02/15/2022:  Therapeutic Exercise: Prone press up on elbow x 10 Cat cow x 10 - instructed to perform through comfortable range Child's pose x 20 sec Supine piriformis stretch 2 x 20 sec LTR 5 x 5 sec Supine posterior pelvic tilt x 10 Modalities: E-stim TENS IFC for lumbar region, x 15 min, 80-150, intensity to patient tolerance MHP applied with e-stim to lumbar region Trialed TPDN treatment but patient unable to tolerate so d/c'd   02/09/2022:  Therapeutic Exercise: Elliptical L5 R1 x 5 min while taking subjective LTR figure-4 each x 10 each Hip switches x 10 each Qaudruped thoracic rotation x 10 each Thoracic extension over FR x 5 at various levels Cat cow x 10 Bird dog 2 x 10 Modified side plank with hip abduction hold 3 x 20 sec Split stance FM row 20# 2 x 10 each SL RDL with forward cone tap 2 x 10 each Kettlebell swing 15# 2 x 10 Pallof press side step with red power band x 10 each  02/07/2022:  Therapeutic Exercise: Recumbent bike L3 x 5 min while taking subjective LTR with feet elevated x 10 each Sidelying thoracic rotation stretch x 10 each Cat cow x 10 Standing pigeon stretch 3 x 15 sec 90-90 hip stretch 5 x 5 sec each Single leg bridge 3  x 8 Modified side plank with clamshell using blue 2 x 10 each Deadlift 45# kettlebell 3 x 10 Dead bug with physioball 2 x 10 Pallof press with red power band x 15 each  PATIENT EDUCATION:  Education details: Performing light/gentle HEP, follow-up with provider Person educated: Patient Education method: Explanation, Demonstration, Tactile cues, and Verbal cues Education comprehension: verbalized understanding, returned demonstration, verbal cues required, tactile cues required, and needs further education  HOME EXERCISE PROGRAM: Access Code: [email or web address]   ASSESSMENT: CLINICAL IMPRESSION: Patient with poor tolerance for therapy this visit due to exacerbation of left lower back and hip/thigh pain, no adverse effects reported. Patient requested trial of TPDN for lumbar and gluteal region but she was unable to tolerate so discontinued. Use of e-stim TENS for lumbar spine with MHP to reduce pain followed by gentle mobility exercises. She did report minimal improvement in symptoms but continued with higher levels of pain compared to previous visit. No changes made to HEP, patient instructed to perform gentle mobility and stretching and gradually work back in to [facility] as tolerated. Patient would benefit from continued skilled PT to reduce muscular pain and tightness and improve strength in order to maximize her functional ability.   OBJECTIVE IMPAIRMENTS decreased activity tolerance, decreased ROM, decreased strength, postural dysfunction, and pain.   ACTIVITY LIMITATIONS cleaning, community activity, meal prep, laundry, yard work, and shopping.   PERSONAL FACTORS Past/current experiences and Time since onset of injury/illness/exacerbation are also affecting patient's functional outcome.    GOALS: Goals reviewed with patient? Yes  SHORT TERM GOALS:  STG Name Target Date Goal status  1 Patient will be I with initial HEP in order to progress with therapy. Baseline: provided at  evaluation 01/11/2022: - MET  2 PT will review FOTO with patient by 3rd visit in order to understand expected progress and outcome with therapy. Baseline:  assessed at evaluation 12/05/2021: reviewed - MET  3 Patient will report </= 2/10 pain level in order to reduce functional limitations. Baseline: 4/10 resting pain at eval 01/11/2022: patient reporting increased pain this visit 01/26/2022: 2-3 pain this visit 02/09/2022 ONGOING   LONG TERM GOALS:   LTG Name Target Date Goal status  1 Patient will be I with final HEP to maintain progress from PT. Baseline: provided at evaluation 01/11/2022: progressing HEP 01/26/2022: progressing HEP        02/23/2022 ONGOING  2 Patient will report > 72% status on FOTO to indicate improved functional ability. Baseline: 72% at intake with prediction of 72% 01/03/2022: 96% - MET  3 Patient will demonstrate periscapular strength grossly >/= 4/5 MMT in order to improve postural control and reduce neck/upper back pain with activity Baseline: grossly 4-/5 MMT 01/11/2022: >/= 4/5 MMT - MET  4 Patient will exhibit DNF endurance >/= 30 seconds to improve postural control and reduce neck tightness with pilates Baseline: 17 seconds 01/11/2022: 20 seconds 01/26/2022: 20 seconds 02/23/2022 ONGOING  5 Patient will report </= 1/10 pain level with all activity in order to return to exercising without limitation Baseline: patient report > 4/10 pain with activity 01/11/2022: patient report 3/10 pain this visit 01/26/2022: patient reports 2-3/10 pain level 02/23/2022 ONGOING  5 Patient will report >/= % status on FOTO to indicate improved functional ability. Baseline: 70% functional status 02/23/2022 INITIAL    PLAN: PT FREQUENCY: 1-2x/week  PT DURATION: 4 weeks  PLANNED INTERVENTIONS: Therapeutic exercises, Therapeutic activity, Neuro Muscular re-education, Balance training, Gait training, Patient/Family education, Joint mobilization, Aquatic Therapy, Dry Needling, Electrical  stimulation, Spinal mobilization, Cryotherapy, Moist heat, Taping, and Manual therapy  PLAN FOR NEXT SESSION: Review HEP and progress PRN, manual/dry needling, thoracic mobility, DNF endurance and periscapular strengthening, core stabilization, hip strengthening   Hilda Blades, PT, DPT, LAT, ATC 02/15/22  11:23 AM Phone: [phone] Fax: [phone]     PHYSICAL THERAPY DISCHARGE SUMMARY  Visits from Start of Care: 15  Current functional level related to goals / functional outcomes: See above   Remaining deficits: See above   Education / Equipment: HEP   Patient agrees to discharge. Patient goals were partially met. Patient is being discharged due to not returning since the last visit.  Hilda Blades, PT, DPT, LAT, ATC 06/20/22  3:14 PM Phone: [phone] Fax: [phone]

## 2022-02-15 ENCOUNTER — Ambulatory Visit: Payer: Medicare HMO | Admitting: Family Medicine

## 2022-02-15 ENCOUNTER — Other Ambulatory Visit: Payer: Self-pay

## 2022-02-15 ENCOUNTER — Ambulatory Visit: Payer: Medicare HMO | Admitting: Physical Therapy

## 2022-02-15 ENCOUNTER — Encounter: Payer: Self-pay | Admitting: Physical Therapy

## 2022-02-15 VITALS — BP 124/78 | Ht 64.0 in | Wt 140.0 lb

## 2022-02-15 DIAGNOSIS — M6281 Muscle weakness (generalized): Secondary | ICD-10-CM

## 2022-02-15 DIAGNOSIS — M542 Cervicalgia: Secondary | ICD-10-CM

## 2022-02-15 DIAGNOSIS — M5459 Other low back pain: Secondary | ICD-10-CM

## 2022-02-15 DIAGNOSIS — S39012A Strain of muscle, fascia and tendon of lower back, initial encounter: Secondary | ICD-10-CM | POA: Diagnosis not present

## 2022-02-15 DIAGNOSIS — M62830 Muscle spasm of back: Secondary | ICD-10-CM | POA: Diagnosis not present

## 2022-02-15 NOTE — Patient Instructions (Signed)
You have a lumbar strain. ?Ok to take tylenol for baseline pain relief (1-2 extra strength tabs 3x/day) ?Baclofen as needed for spasms. ?Icing 15 minutes at a time 3-4 times a day (can use up to every hour). ?I would hold physical therapy, pilates, stretch zone until next week to get over the acute phase of this. ?If therapy needs to renew the order they can send it to me and I will sign it. ?Strengthening of low back muscles, abdominal musculature are key for long term pain relief. ?Follow up with me in 1 month but call/message me sooner if you're not improving. ? ?

## 2022-02-15 NOTE — Progress Notes (Signed)
PCP: Isaac Bliss, Rayford Halsted, MD ? ?Subjective:  ? ?HPI: ?Patient is a 66 y.o. female here for left lower back pain. ? ?She has noticed mild left low back pain over the past 1 week. Today the pain is acutely worse after she bent down to clean the cat box. She felt a slight popping sensation when she was bending down. The pain is constant and worse with any type of movement/activity. The pain extends very minimally into the front of her left hip and slightly to the right side of her back. No numbness, tingling, weakness, or bowel or bladder dysfunction. She had physical therapy today and requested they try dry needling but she was unable to tolerate it. Patient took a dose of Ibuprofen yesterday and today, but is usually very cautious to avoid NSAIDs as she is on Eliquis for a-fib. ? ? ?Past Medical History:  ?Diagnosis Date  ? Abdominal pain, left upper quadrant   ? Acute bronchospasm   ? Acute neck pain 07/02/2019  ? Acute sinusitis, unspecified 05/20/2009  ? Centricity Description: SINUSITIS - ACUTE-NOS Qualifier: Diagnosis of  By: Birdie Riddle MD, Belenda Cruise   Centricity Description: ACUTE SINUSITIS, UNSPECIFIED Qualifier: Diagnosis of  By: Alveta Heimlich MD, Cornelia Copa   Centricity Description: SINUSITIS, ACUTE Qualifier: Diagnosis of  By: Koleen Nimrod MD, Dellis Filbert    ? Anxiety   ? Atypical chest pain 03/05/2013  ? Body mass index (BMI) of 23.0-23.9 in adult   ? Cervicalgia   ? Colon polyp   ? Costochondral junction syndrome   ? Cough   ? DEPRESSION 08/25/2008  ? Qualifier: Diagnosis of  By: Ronnald Ramp CMA, Chem[place]    ? Diarrhea   ? Diverticulosis 07/27/2012  ? Dorsalgia   ? Dysrhythmia   ? hx a-fib  ? Elevated blood pressure reading without diagnosis of hypertension   ? Endometrial polyp 05/2007  ? benign  ? Essential hypertension 09/20/2020  ? Family history of osteoporosis 12/28/2011  ? Mother pt had normal Dexa 11/2009   ? Fatigue   ? GERD (gastroesophageal reflux disease)   ? HIP PAIN, RIGHT 04/12/2009  ? Qualifier: Diagnosis of  By:  Oneida Alar MD, KARL    ? History of hiatal hernia   ? HYPERLIPIDEMIA 08/25/2008  ? Qualifier: Diagnosis of  By: Birdie Riddle MD, Belenda Cruise    ? IBS (irritable bowel syndrome)   ? Left hip pain 07/30/2013  ? Left wrist pain 07/30/2013  ? Lumbar strain, initial encounter 07/28/2019  ? LUNG NODULE 11/19/2009  ? Neg CT    ? 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 Menopause   ? Migraine   ? Hx - Not current problem, Hormone related  ? NEUTROPENIA UNSPECIFIED 10/07/2008  ? Qualifier: Diagnosis of  By: Birdie Riddle MD, Belenda Cruise    ? [place] LUNG FIELD 11/19/2009  ? Qualifier: Diagnosis of  By: Alveta Heimlich MD, Cornelia Copa    ? Nonspecific (abnormal) findings on radiological and other examination of body structure 11/19/2009  ? Qualifier: Diagnosis of By: Alveta Heimlich MD, Bennie Dallas list entry automatically replaced. Please review for accuracy.  ? Otalgia, unspecified ear   ? Pain in throat   ? Palpitations   ? Paroxysmal atrial fibrillation (HCC)   ? Peroneal tendinitis of right lower extremity 03/12/2014  ? Plantar wart 04/02/2014  ? Pneumonia   ? RHINITIS 06/21/2009  ? Qualifier: Diagnosis of  By: Birdie Riddle MD, Belenda Cruise    ? Right ankle injury, subsequent encounter 01/11/2018  ?  Right foot pain 08/09/2011  ? Sinusitis, chronic   ? Skene's gland abscess 2009  ? patient unaware  ? Sprain of unspecified ligament of right ankle, subsequent encounter   ? TRANSAMINASES, SERUM, ELEVATED 12/07/2009  ? Qualifier: Diagnosis of  By: Inda Castle FNP, Wellington [place]   ? Unspecified cataract   ? Urinary tract infection, site not specified   ? Vitamin D deficiency   ? ? ?Current Outpatient Medications on File Prior to Visit  ?Medication Sig Dispense Refill  ? apixaban (ELIQUIS) 5 MG TABS tablet Take 1 tablet (5 mg total) by mouth 2 (two) times daily. 60 tablet 11  ? azelastine (ASTELIN) 0.1 % nasal spray Place 1 spray into both nostrils daily as needed for allergies or rhinitis.    ? baclofen (LIORESAL) 10 MG tablet Take  1 tablet (10 mg total) by mouth 3 (three) times daily as needed for muscle spasms. 60 each 1  ? [place]-Glucosamine-Vit D (OSTEO BI-FLEX ONE PER DAY PO) Take 1 capsule by mouth daily.    ? Calcium Carbonate-Vitamin D (CALTRATE 600+D PO) Take 1 tablet by mouth daily.    ? citalopram (CELEXA) 20 MG tablet Take 1.5 tablets (30 mg total) by mouth daily. 145 tablet 1  ? diltiazem (CARDIZEM) 30 MG tablet Take 1 tablet (30 mg total) by mouth 2 (two) times daily if needed for AFIB 45 tablet 1  ? famotidine (PEPCID) 10 MG tablet Take 1 tablet (10 mg total) by mouth 2 (two) times daily. (Patient taking differently: Take 10 mg by mouth daily as needed for indigestion or heartburn.) 90 tablet 0  ? fluticasone (FLONASE) 50 MCG/ACT nasal spray Place 1-2 sprays into both nostrils daily. (Patient taking differently: Place 1-2 sprays into both nostrils daily as needed for allergies or rhinitis.) 16 g 3  ? levocetirizine (XYZAL) 5 MG tablet Take 5 mg by mouth every evening.    ? metoprolol succinate (TOPROL-XL) 25 MG 24 hr tablet Take 1 tablet (25 mg total) by mouth daily. (Patient taking differently: Take 25 mg by mouth daily at 6 PM.) 90 tablet 1  ? Multiple Vitamins-Minerals (MULTI FOR HER 50+) TABS Take 1 tablet by mouth daily.    ? omeprazole (PRILOSEC) 20 MG capsule Take 1 capsule (20 mg total) by mouth in the morning 30 minutes before morning meal (Patient taking differently: Take 20 mg by mouth daily as needed (acid reflux).) 90 capsule 0  ? Probiotic Product (PROBIOTIC PO) Take 1 capsule by mouth daily. Ultra Flora    ? vitamin C (ASCORBIC ACID) 500 MG tablet Take 500 mg by mouth daily.    ? zinc gluconate 50 MG tablet Take 50 mg by mouth daily.    ? ?No current facility-administered medications on file prior to visit.  ? ? ?Past Surgical History:  ?Procedure Laterality Date  ? BROW LIFT Bilateral 12/29/2021  ? Procedure: BLEPHAROPLASTY;  Surgeon: Lennice Sites, MD;  Location: [facility];  Service: Plastics;  Laterality:  Bilateral;  ? COLONOSCOPY    ? x several  ? DILATION AND CURETTAGE OF UTERUS  05/2007  ? [place]  ? HYSTEROSCOPY WITH D & C N/A 03/30/2017  ? Procedure: DILATATION & CURETTAGE/HYSTEROSCOPY WITH ULTRASOUND GUIDANCE;  Surgeon: Megan Salon, MD;  Location: [facility];  Service: Gynecology;  Laterality: N/A;  ? UPPER GI ENDOSCOPY    ? uterine ablation  2007  ? ? ?Allergies  ?Allergen Reactions  ? Augmentin [Amoxicillin-Pot Clavulanate] Diarrhea  ? ? ?BP 124/78  Ht '5\' 4"'$  (1.626 m)   Wt 140 lb (63.5 kg)   LMP 10/02/2006 (Approximate)   BMI 24.03 kg/m?  ? ? ?  08/30/2020  ?  9:03 AM 06/08/2021  ?  9:34 AM 08/22/2021  ? 10:20 AM 11/09/2021  ? 10:19 AM  ?[facility] Adult Exercise  ?Frequency of aerobic exercise (# of days/week) '7 7 7 6  '$ ?Average time in minutes 60 60 60 60  ?Frequency of strengthening activities (# of days/week) '3 3 2 3  '$ ? ? ? ?    ?Objective:  ?Physical Exam: ? ?Gen: NAD, comfortable in exam room ?Back Exam:  ?Inspection: Unremarkable  ?Palpation: no midline tenderness. There is moderate tenderness to palpation of left lumbar paraspinal muscles.  ?Range of Motion: Normal flexion, bilateral rotation, and bilateral side bending. Normal ROM with extension although this elicits pain.  ?Leg strength: Quad: 5/5 Hamstring: 5/5 Hip flexor: 5/5 Hip abductors: 5/5  ?Strength at foot: Plantar-flexion: 5/5 Dorsi-flexion: 5/5 Eversion: 5/5 Inversion: 5/5  ?Sensory: Gross sensation intact to all lumbar and sacral dermatomes.   ?Gait unremarkable. ?SLR laying: Negative  ?FABER: negativ ? ?  ?Assessment & Plan:  ?1. Lumbar Strain- presentation consistent with left sided lumbar strain. No red flags on history and no bony tenderness on exam. As she is in the very acute phase of her injury, recommended activity modification and ice over the next 1 week. Tylenol for pain relief and Baclofen prn which she already has at home. Resume physical therapy in 1 week for ongoing strengthening  exercises. ? ?Alcus Dad, MD ?PGY-2 [facility] ? ?

## 2022-02-16 ENCOUNTER — Other Ambulatory Visit (HOSPITAL_COMMUNITY): Payer: Self-pay

## 2022-02-17 ENCOUNTER — Other Ambulatory Visit (HOSPITAL_COMMUNITY): Payer: Self-pay

## 2022-02-22 ENCOUNTER — Encounter: Payer: Self-pay | Admitting: Internal Medicine

## 2022-02-22 DIAGNOSIS — F330 Major depressive disorder, recurrent, mild: Secondary | ICD-10-CM

## 2022-03-02 ENCOUNTER — Ambulatory Visit: Payer: Medicare HMO

## 2022-03-07 ENCOUNTER — Ambulatory Visit (INDEPENDENT_AMBULATORY_CARE_PROVIDER_SITE_OTHER): Payer: Medicare HMO | Admitting: Licensed Clinical Social Worker

## 2022-03-07 ENCOUNTER — Encounter (HOSPITAL_COMMUNITY): Payer: Self-pay | Admitting: Licensed Clinical Social Worker

## 2022-03-07 DIAGNOSIS — F4321 Adjustment disorder with depressed mood: Secondary | ICD-10-CM | POA: Diagnosis not present

## 2022-03-07 DIAGNOSIS — R69 Illness, unspecified: Secondary | ICD-10-CM | POA: Diagnosis not present
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
## 2022-03-08 ENCOUNTER — Ambulatory Visit: Payer: Medicare HMO

## 2022-03-08 ENCOUNTER — Encounter: Payer: Self-pay | Admitting: Internal Medicine

## 2022-03-08 ENCOUNTER — Ambulatory Visit (INDEPENDENT_AMBULATORY_CARE_PROVIDER_SITE_OTHER): Payer: Medicare HMO | Admitting: Internal Medicine

## 2022-03-08 VITALS — BP 110/64 | HR 59 | Temp 98.3°F | Wt 146.8 lb

## 2022-03-08 DIAGNOSIS — R1012 Left upper quadrant pain: Secondary | ICD-10-CM | POA: Diagnosis not present

## 2022-03-08 DIAGNOSIS — R109 Unspecified abdominal pain: Secondary | ICD-10-CM | POA: Diagnosis not present

## 2022-03-08 DIAGNOSIS — R10A2 Flank pain, left side: Secondary | ICD-10-CM

## 2022-03-08 LAB — POCT URINALYSIS DIPSTICK
Bilirubin, UA: NEGATIVE
Glucose, UA: NEGATIVE
Ketones, UA: NEGATIVE
Leukocytes, UA: NEGATIVE
Nitrite, UA: NEGATIVE
Protein, UA: NEGATIVE
Spec Grav, UA: 1.01 (ref 1.010–1.025)
Urobilinogen, UA: 0.2 E.U./dL
pH, UA: 6 (ref 5.0–8.0)

## 2022-03-08 NOTE — Therapy (Incomplete)
OUTPATIENT PHYSICAL THERAPY TREATMENT NOTE    Patient Name: Kara Warren MRN: [id number] DOB:1956-09-21, 66 y.o., female Today's Date: 03/08/2022  PCP: Isaac Bliss, Rayford Halsted, MD REFERRING PROVIDER: Isaac Bliss, Estel*                   Past Medical History:  Diagnosis Date   Abdominal pain, left upper quadrant    Acute bronchospasm    Acute neck pain 07/02/2019   Acute sinusitis, unspecified 05/20/2009   Centricity Description: SINUSITIS - ACUTE-NOS Qualifier: Diagnosis of  By: Birdie Riddle MD, Belenda Cruise   Centricity Description: ACUTE SINUSITIS, UNSPECIFIED Qualifier: Diagnosis of  By: Alveta Heimlich MD, Suzanne Boron Description: SINUSITIS, ACUTE Qualifier: Diagnosis of  By: Koleen Nimrod MD, Dellis Filbert     Anxiety    Atypical chest pain 03/05/2013   Body mass index (BMI) of 23.0-23.9 in adult    Cervicalgia    Colon polyp    Costochondral junction syndrome    Cough    DEPRESSION 08/25/2008   Qualifier: Diagnosis of  By: Ronnald Ramp CMA, Chemira     Diarrhea    Diverticulosis 07/27/2012   Dorsalgia    Dysrhythmia    hx a-fib   Elevated blood pressure reading without diagnosis of hypertension    Endometrial polyp 05/2007   benign   Essential hypertension 09/20/2020   Family history of osteoporosis 12/28/2011   Mother pt had normal Dexa 11/2009    Fatigue    GERD (gastroesophageal reflux disease)    HIP PAIN, RIGHT 04/12/2009   Qualifier: Diagnosis of  By: Oneida Alar MD, KARL     History of hiatal hernia    HYPERLIPIDEMIA 08/25/2008   Qualifier: Diagnosis of  By: Birdie Riddle MD, Belenda Cruise     IBS (irritable bowel syndrome)    Left hip pain 07/30/2013   Left wrist pain 07/30/2013   Lumbar strain, initial encounter 07/28/2019   LUNG NODULE 11/19/2009   Neg CT     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 Please review for accuracy.   Otalgia, unspecified ear    Pain in throat    Palpitations    Paroxysmal atrial fibrillation (HCC)    Peroneal tendinitis of right lower extremity 03/12/2014   Plantar wart 04/02/2014   Pneumonia    RHINITIS 06/21/2009   Qualifier: Diagnosis of  By: Birdie Riddle MD, Belenda Cruise     Right ankle injury, subsequent encounter 01/11/2018   Right foot pain 08/09/2011   Sinusitis, chronic    Skene's gland abscess 2009   patient unaware   Sprain of unspecified ligament of right ankle, subsequent encounter    TRANSAMINASES, SERUM, ELEVATED 12/07/2009   Qualifier: Diagnosis of  By: Inda Castle FNP, Melissa S    Unspecified cataract    Urinary tract infection, site not specified    Vitamin D deficiency    Past Surgical History:  Procedure Laterality Date   BROW LIFT Bilateral 12/29/2021   Procedure: BLEPHAROPLASTY;  Surgeon: Lennice Sites, MD;  Location: [facility]  OR;  Service: Clinical cytogeneticist;  Laterality: Bilateral;   COLONOSCOPY     x several   DILATION AND CURETTAGE OF UTERUS  05/2007   GYNECOLOGIC CRYOSURGERY  1980   HYSTEROSCOPY WITH D & C N/A 03/30/2017   Procedure: DILATATION & CURETTAGE/HYSTEROSCOPY WITH ULTRASOUND GUIDANCE;  Surgeon: Megan Salon, MD;  Location: [facility];  Service: Gynecology;  Laterality: N/A;   UPPER GI ENDOSCOPY     uterine ablation  2007   Patient Active Problem List   Diagnosis Date Noted   Secondary hypercoagulable state ([place]) 09/26/2021   Colonoscopy causing post-procedural bleeding 09/20/2020   Essential hypertension 09/20/2020   AF (paroxysmal atrial fibrillation) ([place]) 09/20/2020   Plantar wart 04/02/2014   Peroneal  tendinitis of right lower extremity 03/12/2014   Diverticulosis 07/27/2012   Sinusitis, chronic    Colon polyp    Menopause    Migraine    NONSPCIFC ABN FINDING RAD & OTH EXAM LUNG FIELD 11/19/2009   HIP PAIN, RIGHT 04/12/2009   Hyperlipidemia 08/25/2008   Depression 08/25/2008    REFERRING PROVIDER: Dene Gentry, MD REFERRING DIAG: Neck pain, Low back pain  THERAPY DIAG:  No diagnosis found.  SUBJECTIVE: Patient her left lower back and hip region have been more sore and then today she felt a pop in her lower back when leaning forward which cause even more pain in the left side. She feels the left lower back, hip, and pain into the groin and front of the thigh.  PAIN:  Are you having pain? NPRS scale: 6-7/10 Pain location: Lower back Pain orientation: Bilateral (left > right) PAIN TYPE: Chronic Pain description: Constant, tightness Aggravating factors: Nothing specific Relieving factors: OTC meds, heat, stretch, massage  PATIENT GOALS: Improve feeling of neck/upper/lower back tightness   OBJECTIVE:  PATIENT SURVEYS:  FOTO (lumbar): 70% functional status  POSTURE:  Slightly rounded shoulder and forward head posture  PALPATION: Non tender to palpation this visit  CERVICAL AROM  AROM AROM (deg) 11/28/2021  12/12/2021  01/11/2022  Flexion 50    Extension 40    Right lateral flexion 25 30   Left lateral flexion _0 Right rotation 45 50   Left rotation 50 50 35   Thoracic AROM slightly limited with feeling of tightness primarily in rotation and extension  LUMBAR ROM:   Active  A/PROM  01/26/2022  Flexion WFL  Extension [facility]  Right lateral flexion WFL  Left lateral flexion WFL  Right rotation [facility]  Left rotation [facility]   UE MMT:  MMT Right 11/28/2021 Left 11/28/2021 Right / Left 12/19/2021  Shoulder flexion _1 / 5  Shoulder extension 4+ 4+ 5 / 5  Shoulder abduction 5 5   Shoulder internal rotation 5 5   Shoulder external rotation _2 / 5   Middle trapezius 4 4- 4 / 4  Lower trapezius 4- 4- 4 / 4   LE MMT:  MMT Right 01/26/2022 Left 01/26/2022  Hip flexion 4 4  Hip extension 4- 4-  Hip abduction 4- 4-  Core 4-   FUNCTIONAL TESTS:  DNF endurance: 20 seconds - 01/26/2022   TODAY'S TREATMENT:  03/10/2022:  Therapeutic Exercise: Elliptical L5 R1 x 5 min while taking subjective LTR figure-4 each x 10 each Hip switches x 10 each Quadruped thoracic rotation x 10 each Thoracic extension over FR x 5 at various levels Cat cow x 10 Bird dog 2 x 10 Modified side plank with hip abduction hold 3 x  20 sec Split stance FM row 20# 2 x 10 each SL RDL with forward cone tap 2 x 10 each Kettlebell swing 15# 2 x 10 Pallof press side step with red power band x 10 each   02/15/2022:  Therapeutic Exercise: Prone press up on elbow x 10 Cat cow x 10 - instructed to perform through comfortable range Child's pose x 20 sec Supine piriformis stretch 2 x 20 sec LTR 5 x 5 sec Supine posterior pelvic tilt x 10 Modalities: E-stim TENS IFC for lumbar region, x 15 min, 80-150, intensity to patient tolerance MHP applied with e-stim to lumbar region Trialed TPDN treatment but patient unable to tolerate so d/c'd  02/09/2022:  Therapeutic Exercise: Elliptical L5 R1 x 5 min while taking subjective LTR figure-4 each x 10 each Hip switches x 10 each Qaudruped thoracic rotation x 10 each Thoracic extension over FR x 5 at various levels Cat cow x 10 Bird dog 2 x 10 Modified side plank with hip abduction hold 3 x 20 sec Split stance FM row 20# 2 x 10 each SL RDL with forward cone tap 2 x 10 each Kettlebell swing 15# 2 x 10 Pallof press side step with red power band x 10 each  02/07/2022:  Therapeutic Exercise: Recumbent bike L3 x 5 min while taking subjective LTR with feet elevated x 10 each Sidelying thoracic rotation stretch x 10 each Cat cow x 10 Standing pigeon stretch 3 x 15 sec 90-90 hip stretch 5 x 5 sec each Single leg bridge 3  x 8 Modified side plank with clamshell using blue 2 x 10 each Deadlift 45# kettlebell 3 x 10 Dead bug with physioball 2 x 10 Pallof press with red power band x 15 each  PATIENT EDUCATION:  Education details: Performing light/gentle HEP, follow-up with provider Person educated: Patient Education method: Explanation, Demonstration, Tactile cues, and Verbal cues Education comprehension: verbalized understanding, returned demonstration, verbal cues required, tactile cues required, and needs further education  HOME EXERCISE PROGRAM: Access Code: [email or web address]   ASSESSMENT: CLINICAL IMPRESSION: Patient with poor tolerance for therapy this visit due to exacerbation of left lower back and hip/thigh pain, no adverse effects reported. *** Patient would benefit from continued skilled PT to reduce muscular pain and tightness and improve strength in order to maximize her functional ability.  Patient requested trial of TPDN for lumbar and gluteal region but she was unable to tolerate so discontinued. Use of e-stim TENS for lumbar spine with MHP to reduce pain followed by gentle mobility exercises. She did report minimal improvement in symptoms but continued with higher levels of pain compared to previous visit. No changes made to HEP, patient instructed to perform gentle mobility and stretching and gradually work back in to [employer] as tolerated.    OBJECTIVE IMPAIRMENTS decreased activity tolerance, decreased ROM, decreased strength, postural dysfunction, and pain.   ACTIVITY LIMITATIONS cleaning, community activity, meal prep, laundry, yard work, and shopping.   PERSONAL FACTORS Past/current experiences and Time since onset of injury/illness/exacerbation are also affecting patient's functional outcome.    GOALS: Goals reviewed with patient? Yes  SHORT TERM GOALS:  STG Name Target Date Goal status  1 Patient will be I with initial HEP in order to progress with therapy. Baseline: provided at  evaluation 01/11/2022: - MET  2 PT will review FOTO with patient by 3rd visit in order to understand expected progress and outcome with therapy. Baseline: assessed at evaluation 12/05/2021: reviewed - MET  3 Patient will report </=  2/10 pain level in order to reduce functional limitations. Baseline: 4/10 resting pain at eval 01/11/2022: patient reporting increased pain this visit 01/26/2022: 2-3 pain this visit 02/09/2022 ONGOING   LONG TERM GOALS:   LTG Name Target Date Goal status  1 Patient will be I with final HEP to maintain progress from PT. Baseline: provided at evaluation 01/11/2022: progressing HEP 01/26/2022: progressing HEP        02/23/2022 ONGOING  2 Patient will report > 72% status on FOTO to indicate improved functional ability. Baseline: 72% at intake with prediction of 72% 01/03/2022: 96% - MET  3 Patient will demonstrate periscapular strength grossly >/= 4/5 MMT in order to improve postural control and reduce neck/upper back pain with activity Baseline: grossly 4-/5 MMT 01/11/2022: >/= 4/5 MMT - MET  4 Patient will exhibit DNF endurance >/= 30 seconds to improve postural control and reduce neck tightness with pilates Baseline: 17 seconds 01/11/2022: 20 seconds 01/26/2022: 20 seconds 02/23/2022 ONGOING  5 Patient will report </= 1/10 pain level with all activity in order to return to exercising without limitation Baseline: patient report > 4/10 pain with activity 01/11/2022: patient report 3/10 pain this visit 01/26/2022: patient reports 2-3/10 pain level 02/23/2022 ONGOING  5 Patient will report >/= % status on FOTO to indicate improved functional ability. Baseline: 70% functional status 02/23/2022 INITIAL    PLAN: PT FREQUENCY: 1-2x/week  PT DURATION: 4 weeks  PLANNED INTERVENTIONS: Therapeutic exercises, Therapeutic activity, Neuro Muscular re-education, Balance training, Gait training, Patient/Family education, Joint mobilization, Aquatic Therapy, Dry Needling, Electrical  stimulation, Spinal mobilization, Cryotherapy, Moist heat, Taping, and Manual therapy  PLAN FOR NEXT SESSION: Review HEP and progress PRN, manual/dry needling, thoracic mobility, DNF endurance and periscapular strengthening, core stabilization, hip strengthening   Hilda Blades, PT, DPT, LAT, ATC 03/08/22  2:25 PM Phone: [phone] Fax: [phone]

## 2022-03-08 NOTE — Progress Notes (Signed)
Established Patient Office Visit     CC/Reason for Visit: Left flank pain  HPI: Kara Warren is a 66 y.o. female who is coming in today for the above mentioned reasons.  For the past 2 weeks she has been experiencing left flank pain that has been progressive and worsening over the past few days.  She describes this as burning in quality and intermittent.  It radiates to the front right under her rib cage.  She has not had any nausea, vomiting or diarrhea.  No dysuria or urinary frequency, no fever.  She does not describe any radiculopathy such as numbness or tingling, bowel or bladder incontinence, saddle anesthesia.  She does not have a history of kidney stones in the past.  Past Medical/Surgical History: Past Medical History:  Diagnosis Date   Abdominal pain, left upper quadrant    Acute bronchospasm    Acute neck pain 07/02/2019   Acute sinusitis, unspecified 05/20/2009   Centricity Description: SINUSITIS - ACUTE-NOS Qualifier: Diagnosis of  By: Birdie Riddle MD, Belenda Cruise   Centricity Description: ACUTE SINUSITIS, UNSPECIFIED Qualifier: Diagnosis of  By: Alveta Heimlich MD, Eugene   Centricity Description: SINUSITIS, ACUTE Qualifier: Diagnosis of  By: Koleen Nimrod MD, Dellis Filbert     Anxiety    Atypical chest pain 03/05/2013   Body mass index (BMI) of 23.0-23.9 in adult    Cervicalgia    Colon polyp    Costochondral junction syndrome    Cough    DEPRESSION 08/25/2008   Qualifier: Diagnosis of  By: Ronnald Ramp CMA, Chemira     Diarrhea    Diverticulosis 07/27/2012   Dorsalgia    Dysrhythmia    hx a-fib   Elevated blood pressure reading without diagnosis of hypertension    Endometrial polyp 05/2007   benign   Essential hypertension 09/20/2020   Family history of osteoporosis 12/28/2011   Mother pt had normal Dexa 11/2009    Fatigue    GERD (gastroesophageal reflux disease)    HIP PAIN, RIGHT 04/12/2009   Qualifier: Diagnosis of  By: Oneida Alar MD, KARL     History of hiatal hernia     HYPERLIPIDEMIA 08/25/2008   Qualifier: Diagnosis of  By: Birdie Riddle MD, Belenda Cruise     IBS (irritable bowel syndrome)    Left hip pain 07/30/2013   Left wrist pain 07/30/2013   Lumbar strain, initial encounter 07/28/2019   LUNG NODULE 11/19/2009   Neg CT     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 Please review for accuracy.   Otalgia, unspecified ear    Pain in throat    Palpitations    Paroxysmal atrial fibrillation (HCC)    Peroneal tendinitis of right lower extremity 03/12/2014   Plantar wart 04/02/2014   Pneumonia    RHINITIS 06/21/2009   Qualifier: Diagnosis of  By: Birdie Riddle MD, Belenda Cruise     Right ankle injury, subsequent encounter 01/11/2018   Right foot pain 08/09/2011   Sinusitis, chronic    Skene's gland  abscess 2009   patient unaware   Sprain of unspecified ligament of right ankle, subsequent encounter    TRANSAMINASES, SERUM, ELEVATED 12/07/2009   Qualifier: Diagnosis of  By: Inda Castle FNP, Melissa S    Unspecified cataract    Urinary tract infection, site not specified    Vitamin D deficiency     Past Surgical History:  Procedure Laterality Date   BROW LIFT Bilateral 12/29/2021   Procedure: BLEPHAROPLASTY;  Surgeon: Lennice Sites, MD;  Location: [facility];  Service: Plastics;  Laterality: Bilateral;   COLONOSCOPY     x several   DILATION AND CURETTAGE OF UTERUS  05/2007   GYNECOLOGIC CRYOSURGERY  1980   HYSTEROSCOPY WITH D & C N/A 03/30/2017   Procedure: DILATATION & CURETTAGE/HYSTEROSCOPY  WITH ULTRASOUND GUIDANCE;  Surgeon: Megan Salon, MD;  Location: [facility];  Service: Gynecology;  Laterality: N/A;   UPPER GI ENDOSCOPY     uterine ablation  2007    Social History:  reports that she quit smoking about 43 years ago. Her smoking use included cigarettes. She has a 0.30 pack-year smoking history. She has never used smokeless tobacco. She reports current alcohol use of about 7.0 standard drinks per week. She reports that she does not use drugs.  Allergies: Allergies  Allergen Reactions   Augmentin [Amoxicillin-Pot Clavulanate] Diarrhea    Family History:  Family History  Problem Relation Age of Onset   Colon polyps Father    Ulcerative colitis Father    CAD Father        Stent placement at age 71   Glaucoma Father    Hypertension Father    Colitis Father    Breast cancer Paternal Grandmother    Colon cancer Maternal Grandfather    Colon polyps Mother    Osteoporosis Mother    Thyroid disease Mother        hypo   Glaucoma Mother    Diverticulosis Mother    Sudden death Neg Hx    Hyperlipidemia Neg Hx    Heart attack Neg Hx    Diabetes Neg Hx      Current Outpatient Medications:    apixaban (ELIQUIS) 5 MG TABS tablet, Take 1 tablet (5 mg total) by mouth 2 (two) times daily., Disp: 60 tablet, Rfl: 11   azelastine (ASTELIN) 0.1 % nasal spray, Place 1 spray into both nostrils daily as needed for allergies or rhinitis., Disp: , Rfl:    baclofen (LIORESAL) 10 MG tablet, Take 1 tablet (10 mg total) by mouth 3 (three) times daily as needed for muscle spasms., Disp: 60 each, Rfl: 1   Boswellia-Glucosamine-Vit D (OSTEO BI-FLEX ONE PER DAY PO), Take 1 capsule by mouth daily., Disp: , Rfl:    Calcium Carbonate-Vitamin D (CALTRATE 600+D PO), Take 1 tablet by mouth daily., Disp: , Rfl:    citalopram (CELEXA) 20 MG tablet, Take 1.5 tablets (30 mg total) by mouth daily., Disp: 145 tablet, Rfl: 1   diltiazem (CARDIZEM) 30 MG tablet, Take 1 tablet (30 mg total) by mouth 2  (two) times daily if needed for AFIB, Disp: 45 tablet, Rfl: 1   famotidine (PEPCID) 10 MG tablet, Take 1 tablet (10 mg total) by mouth 2 (two) times daily. (Patient taking differently: Take 10 mg by mouth daily as needed for indigestion or heartburn.), Disp: 90 tablet, Rfl: 0   fluticasone (FLONASE) 50 MCG/ACT nasal spray, Place 1-2 sprays into both nostrils daily. (Patient taking differently: Place 1-2 sprays into both nostrils daily as needed  for allergies or rhinitis.), Disp: 16 g, Rfl: 3   levocetirizine (XYZAL) 5 MG tablet, Take 5 mg by mouth every evening., Disp: , Rfl:    metoprolol succinate (TOPROL-XL) 25 MG 24 hr tablet, Take 1 tablet (25 mg total) by mouth daily. (Patient taking differently: Take 25 mg by mouth daily at 6 PM.), Disp: 90 tablet, Rfl: 1   Multiple Vitamins-Minerals (MULTI FOR HER 50+) TABS, Take 1 tablet by mouth daily., Disp: , Rfl:    omeprazole (PRILOSEC) 20 MG capsule, Take 1 capsule (20 mg total) by mouth in the morning 30 minutes before morning meal (Patient taking differently: Take 20 mg by mouth daily as needed (acid reflux).), Disp: 90 capsule, Rfl: 0   Probiotic Product (PROBIOTIC PO), Take 1 capsule by mouth daily. Ultra Flora, Disp: , Rfl:    vitamin C (ASCORBIC ACID) 500 MG tablet, Take 500 mg by mouth daily., Disp: , Rfl:    zinc gluconate 50 MG tablet, Take 50 mg by mouth daily., Disp: , Rfl:   Review of Systems:  Constitutional: Denies fever, chills, diaphoresis, appetite change and fatigue.  HEENT: Denies photophobia, eye pain, redness, hearing loss, ear pain, congestion, sore throat, rhinorrhea, sneezing, mouth sores, trouble swallowing, neck pain, neck stiffness and tinnitus.   Respiratory: Denies SOB, DOE, cough, chest tightness,  and wheezing.   Cardiovascular: Denies chest pain, palpitations and leg swelling.  Gastrointestinal: Denies nausea, vomiting, , diarrhea, constipation, blood in stool and abdominal distention.  Genitourinary: Denies dysuria,  urgency, frequency, hematuria, flank pain and difficulty urinating.  Endocrine: Denies: hot or cold intolerance, sweats, changes in hair or nails, polyuria, polydipsia. Musculoskeletal: Denies myalgias, joint swelling, arthralgias and gait problem.  Skin: Denies pallor, rash and wound.  Neurological: Denies dizziness, seizures, syncope, weakness, light-headedness, numbness and headaches.  Hematological: Denies adenopathy. Easy bruising, personal or family bleeding history  Psychiatric/Behavioral: Denies suicidal ideation, mood changes, confusion, nervousness, sleep disturbance and agitation    Physical Exam: Vitals:   03/08/22 1339  BP: 110/64  Pulse: (!) 59  Temp: 98.3 F (36.8 C)  TempSrc: Oral  SpO2: 99%  Weight: 146 lb 12.8 oz (66.6 kg)    Body mass index is 25.2 kg/m.   Constitutional: NAD, calm, comfortable Eyes: PERRL, lids and conjunctivae normal, wears corrective lenses ENMT: Mucous membranes are moist.  Psychiatric: Normal judgment and insight. Alert and oriented x 3. Normal mood.    Impression and Plan:  Left flank pain - Plan: POCT urinalysis dipstick, Urinalysis, Urine Culture, CT RENAL STONE STUDY  Left upper quadrant abdominal pain - Plan: CT RENAL STONE STUDY  -I am concerned about the possibility of nephrolithiasis especially with pain described as burning and hematuria noted on urine dipstick in office. -Withhold antibiotics pending results of culture. -CT stone protocol requested today.    Time spent:24 minutes reviewing chart, interviewing and examining patient and formulating plan of care.      Lelon Frohlich, MD [facility]

## 2022-03-09 ENCOUNTER — Telehealth: Payer: Self-pay | Admitting: Internal Medicine

## 2022-03-09 NOTE — Telephone Encounter (Signed)
Pt wants to provide a phone number for PA for [facility] [phone] for her CT scan

## 2022-03-10 ENCOUNTER — Ambulatory Visit: Payer: Medicare HMO | Admitting: Physical Therapy

## 2022-03-10 ENCOUNTER — Other Ambulatory Visit (HOSPITAL_COMMUNITY): Payer: Self-pay

## 2022-03-13 ENCOUNTER — Other Ambulatory Visit (HOSPITAL_COMMUNITY): Payer: Self-pay

## 2022-03-13 ENCOUNTER — Other Ambulatory Visit: Payer: Self-pay | Admitting: Internal Medicine

## 2022-03-13 MED ORDER — AZELASTINE HCL 0.1 % NA SOLN
1.0000 | Freq: Two times a day (BID) | NASAL | 3 refills | Status: DC
  Filled 2022-03-13: qty 30, 25d supply, fill #0

## 2022-03-13 NOTE — Telephone Encounter (Signed)
Pt called to check on status of CT scan and would like a call back

## 2022-03-14 ENCOUNTER — Ambulatory Visit (INDEPENDENT_AMBULATORY_CARE_PROVIDER_SITE_OTHER): Payer: Medicare HMO | Admitting: Licensed Clinical Social Worker

## 2022-03-14 ENCOUNTER — Ambulatory Visit (HOSPITAL_BASED_OUTPATIENT_CLINIC_OR_DEPARTMENT_OTHER)
Admission: RE | Admit: 2022-03-14 | Discharge: 2022-03-14 | Disposition: A | Discharge status: Home / Self Care | Payer: Medicare HMO | Source: Ambulatory Visit | Attending: Internal Medicine | Admitting: Internal Medicine

## 2022-03-14 DIAGNOSIS — R109 Unspecified abdominal pain: Secondary | ICD-10-CM | POA: Diagnosis not present

## 2022-03-14 DIAGNOSIS — F4321 Adjustment disorder with depressed mood: Secondary | ICD-10-CM

## 2022-03-14 DIAGNOSIS — R1012 Left upper quadrant pain: Secondary | ICD-10-CM | POA: Insufficient documentation

## 2022-03-14 DIAGNOSIS — K5730 Diverticulosis of large intestine without perforation or abscess without bleeding: Secondary | ICD-10-CM | POA: Diagnosis not present

## 2022-03-14 DIAGNOSIS — R69 Illness, unspecified: Secondary | ICD-10-CM | POA: Diagnosis not present

## 2022-03-15 ENCOUNTER — Other Ambulatory Visit: Payer: Medicare HMO

## 2022-03-15 ENCOUNTER — Encounter: Payer: Self-pay | Admitting: Internal Medicine

## 2022-03-15 DIAGNOSIS — R319 Hematuria, unspecified: Secondary | ICD-10-CM

## 2022-03-15 NOTE — Telephone Encounter (Signed)
Spoke with lab, there was a specimen unlabeled on 6/7 when urine was given. It was discarded due to no label. Spoke with PCP and wants to get the culture done, spoke with patient and she will be coming in to give a urine specimen. Placed on lab schedule just in case.

## 2022-03-16 ENCOUNTER — Other Ambulatory Visit (INDEPENDENT_AMBULATORY_CARE_PROVIDER_SITE_OTHER): Payer: Medicare HMO

## 2022-03-16 ENCOUNTER — Other Ambulatory Visit (HOSPITAL_COMMUNITY): Payer: Self-pay

## 2022-03-16 DIAGNOSIS — R109 Unspecified abdominal pain: Secondary | ICD-10-CM | POA: Diagnosis not present

## 2022-03-16 DIAGNOSIS — Z86010 Personal history of colonic polyps: Secondary | ICD-10-CM | POA: Diagnosis not present

## 2022-03-16 DIAGNOSIS — K219 Gastro-esophageal reflux disease without esophagitis: Secondary | ICD-10-CM | POA: Diagnosis not present

## 2022-03-16 DIAGNOSIS — M940 Chondrocostal junction syndrome [Tietze]: Secondary | ICD-10-CM | POA: Diagnosis not present

## 2022-03-16 DIAGNOSIS — Z1211 Encounter for screening for malignant neoplasm of colon: Secondary | ICD-10-CM | POA: Diagnosis not present

## 2022-03-16 DIAGNOSIS — K5730 Diverticulosis of large intestine without perforation or abscess without bleeding: Secondary | ICD-10-CM | POA: Diagnosis not present

## 2022-03-16 LAB — URINALYSIS
Bilirubin Urine: NEGATIVE
Ketones, ur: NEGATIVE
Leukocytes,Ua: NEGATIVE
Nitrite: NEGATIVE
Specific Gravity, Urine: <=1.005 — AB (ref 1.000–1.030)
Total Protein, Urine: NEGATIVE
Urine Glucose: NEGATIVE
Urobilinogen, UA: 0.2 (ref 0.0–1.0)
pH: 6 (ref 5.0–8.0)

## 2022-03-16 MED ORDER — METHYLPREDNISOLONE 4 MG PO TBPK
ORAL_TABLET | ORAL | 0 refills | Status: DC
  Filled 2022-03-16: qty 21, 6d supply, fill #0

## 2022-03-17 LAB — URINE CULTURE
MICRO NUMBER:: 13530222
Result:: NO GROWTH
SPECIMEN QUALITY:: ADEQUATE

## 2022-03-21 ENCOUNTER — Encounter (HOSPITAL_COMMUNITY): Payer: Self-pay | Admitting: Licensed Clinical Social Worker

## 2022-03-21 ENCOUNTER — Other Ambulatory Visit (HOSPITAL_COMMUNITY): Payer: Self-pay

## 2022-03-21 NOTE — Telephone Encounter (Signed)
Spoke with patient and the left side and back pain is "not as severe'. Information given about GU referral.  Patient will send a mychart message as needed.

## 2022-03-21 NOTE — Progress Notes (Signed)
   THERAPIST PROGRESS NOTE  Session Time: 12:30pm-1:30pm  Participation Level: Active  Behavioral Response: Well GroomedAlertEuthymic  Type of Therapy: Individual Therapy  Treatment Goals addressed: Jamiracle will identify patterns in relationships that are related to her level of self esteem and make choices more in line with higher levels of self esteem and self-compassion.  ProgressTowards Goals: Initial  Interventions: CBT and Motivational Interviewing  Summary: Kara Warren is a 66 y.o. female who presents with Adjustment Disorder with depressed mood.   Suicidal/Homicidal: Nowithout intent/plan  Therapist Response: Aleksa engaged well in individual in person session with clinician. Clinician explored current mood and behaviors. Brinlee identified some life challenges and processed some negative self-talk that occurs internally. Clinician provided psychoeducation about the impact of self talk on decision making and core beliefs. Clinician processed the impact of self esteem and the importance of self compassion.   Plan: Return again in 2 weeks.  Diagnosis: Adjustment disorder with depressed mood  Collaboration of Care: Other none required in this session  Patient/Guardian was advised Release of Information must be obtained prior to any record release in order to collaborate their care with an outside provider. Patient/Guardian was advised if they have not already done so to contact the registration department to sign all necessary forms in order for [place] to release information regarding their care.   Consent: Patient/Guardian gives verbal consent for treatment and assignment of benefits for services provided during this visit. Patient/Guardian expressed understanding and agreed to proceed.   Jobe Marker Drexel, LCSW 03/21/2022

## 2022-03-21 NOTE — Plan of Care (Signed)
Identified impact of self esteem on decision making in relationships.

## 2022-03-22 ENCOUNTER — Ambulatory Visit (HOSPITAL_COMMUNITY): Payer: Medicare HMO | Admitting: Licensed Clinical Social Worker

## 2022-03-24 ENCOUNTER — Other Ambulatory Visit (HOSPITAL_COMMUNITY): Payer: Self-pay

## 2022-03-24 MED ORDER — METHYLPREDNISOLONE 4 MG PO TBPK
ORAL_TABLET | ORAL | 0 refills | Status: DC
Start: 2022-03-24 — End: 2022-04-12
  Filled 2022-03-24: qty 21, 6d supply, fill #0

## 2022-03-27 ENCOUNTER — Other Ambulatory Visit: Payer: Self-pay

## 2022-03-27 ENCOUNTER — Encounter (HOSPITAL_BASED_OUTPATIENT_CLINIC_OR_DEPARTMENT_OTHER): Payer: Self-pay | Admitting: Obstetrics and Gynecology

## 2022-03-27 ENCOUNTER — Emergency Department (HOSPITAL_BASED_OUTPATIENT_CLINIC_OR_DEPARTMENT_OTHER): Payer: Medicare HMO | Admitting: Radiology

## 2022-03-27 ENCOUNTER — Emergency Department (HOSPITAL_BASED_OUTPATIENT_CLINIC_OR_DEPARTMENT_OTHER)
Admission: EM | Admit: 2022-03-27 | Discharge: 2022-03-27 | Disposition: A | Discharge status: Home / Self Care | Payer: Medicare HMO | Attending: Emergency Medicine | Admitting: Emergency Medicine

## 2022-03-27 DIAGNOSIS — R002 Palpitations: Secondary | ICD-10-CM | POA: Insufficient documentation

## 2022-03-27 DIAGNOSIS — R000 Tachycardia, unspecified: Secondary | ICD-10-CM | POA: Diagnosis not present

## 2022-03-27 DIAGNOSIS — Z7901 Long term (current) use of anticoagulants: Secondary | ICD-10-CM | POA: Diagnosis not present

## 2022-03-27 DIAGNOSIS — R0602 Shortness of breath: Secondary | ICD-10-CM | POA: Diagnosis not present

## 2022-03-27 LAB — CBC WITH DIFFERENTIAL/PLATELET
Abs Immature Granulocytes: 0.05 10*3/uL (ref 0.00–0.07)
Basophils Absolute: 0 10*3/uL (ref 0.0–0.1)
Basophils Relative: 0 %
Eosinophils Absolute: 0 10*3/uL (ref 0.0–0.5)
Eosinophils Relative: 0 %
HCT: 43.3 % (ref 36.0–46.0)
Hemoglobin: 14.4 g/dL (ref 12.0–15.0)
Immature Granulocytes: 1 %
Lymphocytes Relative: 17 %
Lymphs Abs: 1.4 10*3/uL (ref 0.7–4.0)
MCH: 32.4 pg (ref 26.0–34.0)
MCHC: 33.3 g/dL (ref 30.0–36.0)
MCV: 97.3 fL (ref 80.0–100.0)
Monocytes Absolute: 0.4 10*3/uL (ref 0.1–1.0)
Monocytes Relative: 5 %
Neutro Abs: 6.3 10*3/uL (ref 1.7–7.7)
Neutrophils Relative %: 77 %
Platelets: 313 10*3/uL (ref 150–400)
RBC: 4.45 MIL/uL (ref 3.87–5.11)
RDW: 12.9 % (ref 11.5–15.5)
WBC: 8.3 10*3/uL (ref 4.0–10.5)
nRBC: 0 % (ref 0.0–0.2)

## 2022-03-27 LAB — TROPONIN I (HIGH SENSITIVITY): Troponin I (High Sensitivity): 6 ng/L (ref <18)

## 2022-03-27 LAB — BASIC METABOLIC PANEL
Anion gap: 13 (ref 5–15)
BUN: 19 mg/dL (ref 8–23)
CO2: 22 mmol/L (ref 22–32)
Calcium: 10.2 mg/dL (ref 8.9–10.3)
Chloride: 99 mmol/L (ref 98–111)
Creatinine, Ser: 0.82 mg/dL (ref 0.44–1.00)
GFR, Estimated: >60 mL/min (ref >60)
Glucose, Bld: 135 mg/dL — ABNORMAL HIGH (ref 70–99)
Potassium: 3.6 mmol/L (ref 3.5–5.1)
Sodium: 134 mmol/L — ABNORMAL LOW (ref 135–145)

## 2022-03-27 NOTE — ED Triage Notes (Signed)
Patient reports to the ER for her HR being 200. Patient reports she took Cardizem but her heart remained tachycardiac for reportedly an hour or more. Patient reports she has been having ShOB when her heart is racing. Denies chest pain.

## 2022-03-28 ENCOUNTER — Telehealth: Payer: Self-pay | Admitting: Cardiovascular Disease

## 2022-03-28 ENCOUNTER — Encounter: Payer: Self-pay | Admitting: Family Medicine

## 2022-03-28 NOTE — Telephone Encounter (Signed)
Called pt in regards to HR 178.  Pt reports HR is not currently 178.  On 03/27/22 pt at nail appointment HR up to 158 pt went home about 45 min to 1 hour later HR 178 using Kardia.  Pt took PRN Cardizem as ordered and reported to ED for evaluation.  Per pt at ED HR back to SR, blood work and Cxr normal.  Has not missed any doses of Eliquis or metoprolol.  Pt was told by ED to f/u with Cardiology.  Advised pt will send message to MD nurse to f/u.

## 2022-03-29 ENCOUNTER — Emergency Department (HOSPITAL_COMMUNITY)
Admission: EM | Admit: 2022-03-29 | Discharge: 2022-03-29 | Disposition: A | Discharge status: Home / Self Care | Payer: Medicare HMO | Attending: Emergency Medicine | Admitting: Emergency Medicine

## 2022-03-29 ENCOUNTER — Encounter (HOSPITAL_COMMUNITY): Payer: Self-pay | Admitting: Pharmacy Technician

## 2022-03-29 ENCOUNTER — Other Ambulatory Visit: Payer: Self-pay

## 2022-03-29 ENCOUNTER — Emergency Department (HOSPITAL_COMMUNITY): Payer: Medicare HMO

## 2022-03-29 DIAGNOSIS — I10 Essential (primary) hypertension: Secondary | ICD-10-CM | POA: Diagnosis not present

## 2022-03-29 DIAGNOSIS — R109 Unspecified abdominal pain: Secondary | ICD-10-CM | POA: Insufficient documentation

## 2022-03-29 DIAGNOSIS — Z7901 Long term (current) use of anticoagulants: Secondary | ICD-10-CM | POA: Insufficient documentation

## 2022-03-29 DIAGNOSIS — I4891 Unspecified atrial fibrillation: Secondary | ICD-10-CM | POA: Diagnosis not present

## 2022-03-29 DIAGNOSIS — Z79899 Other long term (current) drug therapy: Secondary | ICD-10-CM | POA: Diagnosis not present

## 2022-03-29 LAB — CBC
HCT: 45.9 % (ref 36.0–46.0)
Hemoglobin: 14.9 g/dL (ref 12.0–15.0)
MCH: 32.6 pg (ref 26.0–34.0)
MCHC: 32.5 g/dL (ref 30.0–36.0)
MCV: 100.4 fL — ABNORMAL HIGH (ref 80.0–100.0)
Platelets: 317 10*3/uL (ref 150–400)
RBC: 4.57 MIL/uL (ref 3.87–5.11)
RDW: 12.8 % (ref 11.5–15.5)
WBC: 7.4 10*3/uL (ref 4.0–10.5)
nRBC: 0 % (ref 0.0–0.2)

## 2022-03-29 LAB — HEPATIC FUNCTION PANEL
ALT: 46 U/L — ABNORMAL HIGH (ref 0–44)
AST: 33 U/L (ref 15–41)
Albumin: 4.1 g/dL (ref 3.5–5.0)
Alkaline Phosphatase: 56 U/L (ref 38–126)
Bilirubin, Direct: 0.1 mg/dL (ref 0.0–0.2)
Indirect Bilirubin: 0.9 mg/dL (ref 0.3–0.9)
Total Bilirubin: 1 mg/dL (ref 0.3–1.2)
Total Protein: 6.8 g/dL (ref 6.5–8.1)

## 2022-03-29 LAB — TSH: TSH: 0.539 u[IU]/mL (ref 0.350–4.500)

## 2022-03-29 LAB — BASIC METABOLIC PANEL
Anion gap: 12 (ref 5–15)
BUN: 11 mg/dL (ref 8–23)
CO2: 26 mmol/L (ref 22–32)
Calcium: 9.8 mg/dL (ref 8.9–10.3)
Chloride: 97 mmol/L — ABNORMAL LOW (ref 98–111)
Creatinine, Ser: 0.85 mg/dL (ref 0.44–1.00)
GFR, Estimated: >60 mL/min (ref >60)
Glucose, Bld: 117 mg/dL — ABNORMAL HIGH (ref 70–99)
Potassium: 4.2 mmol/L (ref 3.5–5.1)
Sodium: 135 mmol/L (ref 135–145)

## 2022-03-29 LAB — URINALYSIS, ROUTINE W REFLEX MICROSCOPIC
Bilirubin Urine: NEGATIVE
Glucose, UA: NEGATIVE mg/dL
Ketones, ur: NEGATIVE mg/dL
Nitrite: NEGATIVE
Protein, ur: NEGATIVE mg/dL
Specific Gravity, Urine: 1.004 — ABNORMAL LOW (ref 1.005–1.030)
pH: 6 (ref 5.0–8.0)

## 2022-03-29 LAB — MAGNESIUM: Magnesium: 2.1 mg/dL (ref 1.7–2.4)

## 2022-03-29 LAB — TROPONIN I (HIGH SENSITIVITY)
Troponin I (High Sensitivity): 13 ng/L (ref <18)
Troponin I (High Sensitivity): 12 ng/L (ref <18)

## 2022-03-29 LAB — D-DIMER, QUANTITATIVE: D-Dimer, Quant: 0.29 ug/mL-FEU (ref 0.00–0.50)

## 2022-03-29 LAB — LIPASE, BLOOD: Lipase: 38 U/L (ref 11–51)

## 2022-03-29 MED ORDER — ACETAMINOPHEN 325 MG PO TABS
650.0000 mg | ORAL_TABLET | Freq: Once | ORAL | Status: AC
  Administered 2022-03-29: 650 mg via ORAL
  Filled 2022-03-29: qty 2

## 2022-03-29 NOTE — ED Provider Notes (Signed)
[facility] EMERGENCY DEPARTMENT Provider Note   CSN: [id number] Arrival date & time: 03/29/22  1017     History  Chief Complaint  Patient presents with   Atrial Fibrillation    Kara Warren is a 66 y.o. female.  Patient with history of afib on Elliquis, hypertension, hyperlipidemia presents today with complaints of atrial fibrillation. States that earlier today she felt her heart beating very fast all of a sudden. States that she checked her apple watch and saw that it was in the 160s and was reading afib. States that she took her home cardizem that she is supposed to take when she goes into [facility] without any improvement. States that this lasted for about 30 minutes before resolving. Patient with a known history of atrial fibrillation.  Is on Toprol-XL.  Is compliant with her Eliquis and denies any missed doses. She is followed by [facility] cardiology and has an appointment tomorrow. Upon arrival to the ER, EKG was showing sinus rhythm with a heart rate of around 90.  And she had felt that the palpitations had resolved.  She does state that her normal resting heart rate is around 50. Was here for similar symptoms 2 days ago and discharged. Last time before that when this happened was last November. Denies any shortness of breath or chest pain.   Additionally, patient states that she has been having left sided flank pain for the past several weeks. Originally went to her PCP on 6/7 and had CT renal on 6/13 which was unremarkable. PCP thought pain might be from chest wall inflammation and placed her on prednisone which she states gave her improvement but did not resolve her symptoms and once she stopped the steroids the pain worsened again. She states that the pain has been progressively worsening since then and feels burning and sharp in nature. It is intermittent and over her right rib cage. Denies nausea, vomiting, or diarrhea. Denies any urinary symptoms or fevers. Denies any  leg pain, leg swelling, shortness of breath, recent travel, recent surgeries, history of cancer, or history of blood clots. Patient does have an appointment with urology and GI tomorrow for further evaluation and management of same.     The history is provided by the patient. No language interpreter was used.  Atrial Fibrillation       Home Medications Prior to Admission medications   Medication Sig Start Date End Date Taking? Authorizing Provider  apixaban (ELIQUIS) 5 MG TABS tablet Take 1 tablet (5 mg total) by mouth 2 (two) times daily. 01/02/22   Burnell Blanks, MD  azelastine (ASTELIN) 0.1 % nasal spray Place 1 spray into both nostrils daily as needed for allergies or rhinitis.    [provider]  azelastine (ASTELIN) 0.1 % nasal spray Place 1-2 sprays into both nostrils 2 (two) times daily. 03/13/22   Isaac Bliss, Rayford Halsted, MD  baclofen (LIORESAL) 10 MG tablet Take 1 tablet (10 mg total) by mouth 3 (three) times daily as needed for muscle spasms. 01/16/22   Hudnall, Sharyn Lull, MD  Boswellia-Glucosamine-Vit D (OSTEO BI-FLEX ONE PER DAY PO) Take 1 capsule by mouth daily.    [provider]  Calcium Carbonate-Vitamin D (CALTRATE 600+D PO) Take 1 tablet by mouth daily.    [provider]  citalopram (CELEXA) 20 MG tablet Take 1.5 tablets (30 mg total) by mouth daily. 01/24/22   Isaac Bliss, Rayford Halsted, MD  diltiazem (CARDIZEM) 30 MG tablet Take 1 tablet (30  mg total) by mouth 2 (two) times daily if needed for AFIB 01/02/22   Burnell Blanks, MD  famotidine (PEPCID) 10 MG tablet Take 1 tablet (10 mg total) by mouth 2 (two) times daily. Patient taking differently: Take 10 mg by mouth daily as needed for indigestion or heartburn. 08/23/21   Isaac Bliss, Rayford Halsted, MD  fluticasone (FLONASE) 50 MCG/ACT nasal spray Place 1-2 sprays into both nostrils daily. Patient taking differently: Place 1-2 sprays into both nostrils daily as needed for allergies  or rhinitis. 07/12/21     levocetirizine (XYZAL) 5 MG tablet Take 5 mg by mouth every evening.    [provider]  methylPREDNISolone (MEDROL DOSEPAK) 4 MG TBPK tablet Take as directed 03/16/22     methylPREDNISolone (MEDROL DOSEPAK) 4 MG TBPK tablet Take as directed on package. 03/24/22     metoprolol succinate (TOPROL-XL) 25 MG 24 hr tablet Take 1 tablet (25 mg total) by mouth daily. Patient taking differently: Take 25 mg by mouth daily at 6 PM. 12/09/21   Isaac Bliss, Rayford Halsted, MD  Multiple Vitamins-Minerals (MULTI FOR HER 50+) TABS Take 1 tablet by mouth daily.    [provider]  omeprazole (PRILOSEC) 20 MG capsule Take 1 capsule (20 mg total) by mouth in the morning 30 minutes before morning meal Patient taking differently: Take 20 mg by mouth daily as needed (acid reflux). 12/12/21   Isaac Bliss, Rayford Halsted, MD  Probiotic Product (PROBIOTIC PO) Take 1 capsule by mouth daily. [place]    [provider]  vitamin C (ASCORBIC ACID) 500 MG tablet Take 500 mg by mouth daily.    [provider]  zinc gluconate 50 MG tablet Take 50 mg by mouth daily.    [provider]      Allergies    Augmentin [amoxicillin-pot clavulanate]    Review of Systems   Review of Systems  Cardiovascular:  Positive for palpitations.  Genitourinary:  Positive for flank pain.  All other systems reviewed and are negative.   Physical Exam Updated Vital Signs BP 117/73   Pulse 61   Temp 98 F (36.7 C) (Oral)   Resp 19   LMP 10/02/2006 (Approximate)   SpO2 95%  Physical Exam Vitals and nursing note reviewed.  Constitutional:      General: She is not in acute distress.    Appearance: Normal appearance. She is normal weight. She is not ill-appearing, toxic-appearing or diaphoretic.  HENT:     Head: Normocephalic and atraumatic.  Cardiovascular:     Rate and Rhythm: Normal rate and regular rhythm.     Heart sounds: Normal heart sounds.  Pulmonary:      Effort: Pulmonary effort is normal. No respiratory distress.     Breath sounds: Normal breath sounds.  Chest:     Comments: Tenderness to palpation over the left lower ribcage/flank area. No overlying rashes, skin changes, erythema, warmth, or deformity noted.  Musculoskeletal:        General: Normal range of motion.     Cervical back: Normal range of motion.  Skin:    General: Skin is warm and dry.  Neurological:     General: No focal deficit present.     Mental Status: She is alert.  Psychiatric:        Mood and Affect: Mood normal.        Behavior: Behavior normal.     ED Results / Procedures / Treatments   Labs (all labs ordered are  listed, but only abnormal results are displayed) Labs Reviewed  BASIC METABOLIC PANEL - Abnormal; Notable for the following components:      Result Value   Chloride 97 (*)    Glucose, Bld 117 (*)    All other components within normal limits  CBC - Abnormal; Notable for the following components:   MCV 100.4 (*)    All other components within normal limits  URINALYSIS, ROUTINE W REFLEX MICROSCOPIC - Abnormal; Notable for the following components:   Color, Urine STRAW (*)    Specific Gravity, Urine 1.004 (*)    Hgb urine dipstick SMALL (*)    Leukocytes,Ua SMALL (*)    Bacteria, UA RARE (*)    All other components within normal limits  MAGNESIUM  TSH  D-DIMER, QUANTITATIVE  HEPATIC FUNCTION PANEL  LIPASE, BLOOD  TROPONIN I (HIGH SENSITIVITY)  TROPONIN I (HIGH SENSITIVITY)    EKG None  Radiology DG Chest 2 View  Result Date: 03/29/2022 CLINICAL DATA:  AFib. EXAM: CHEST - 2 VIEW COMPARISON:  Chest x-ray March 27, 2022. FINDINGS: The heart size and mediastinal contours are within normal limits. Both lungs are clear. No visible pleural effusions or pneumothorax. No acute osseous abnormality. IMPRESSION: No active cardiopulmonary disease. Electronically Signed   By: Margaretha Sheffield M.D.   On: 03/29/2022 10:53   DG Chest 2 View  Result  Date: 03/27/2022 CLINICAL DATA:  Tachycardia and shortness of breath. EXAM: CHEST - 2 VIEW COMPARISON:  Chest x-ray dated September 20, 2020. FINDINGS: The heart size and mediastinal contours are within normal limits. Both lungs are clear. The visualized skeletal structures are unremarkable. IMPRESSION: No active cardiopulmonary disease. Electronically Signed   By: Titus Dubin M.D.   On: 03/27/2022 14:32    Procedures Procedures    Medications Ordered in ED Medications  acetaminophen (TYLENOL) tablet 650 mg (650 mg Oral Given 03/29/22 1535)    ED Course/ Medical Decision Making/ A&P                           Medical Decision Making Amount and/or Complexity of Data Reviewed Labs: ordered. Radiology: ordered.  Risk OTC drugs.   This patient presents to the ED for concern of atrial fibrillation and left flank pain, this involves an extensive number of treatment options, and is a complaint that carries with it a high risk of complications and morbidity.  The differential diagnosis includes ACS, PE, pancreatitis, pyelonephritis. This is not an exhaustive differential    Co morbidities that complicate the patient evaluation  Hx afib on Elliquis   Additional history obtained:  Additional history obtained from previous ER note and PCP note and associated labs and imaging  Lab Tests:  I Ordered, and personally interpreted labs.  The pertinent results include:  UA with blood, leukocytes, and rare bacteria, likely dirty sample as patient denies any dysuria. D dimer normal, troponin normal. No other acute laboratory findings.   Imaging Studies ordered:  I ordered imaging studies including CXR  I independently visualized and interpreted imaging which showed no acute findings I agree with the radiologist interpretation   Cardiac Monitoring: / EKG:  The patient was maintained on a cardiac monitor.  I personally viewed and interpreted the cardiac monitored which showed an  underlying rhythm of: sinus rhythm   Problem List / ED Course / Critical interventions / Medication management  I ordered medication including tylenol  for flank pain  Reevaluation of the patient after  these medicines showed that the patient improved I have reviewed the patients home medicines and have made adjustments as needed   Test / Admission - Considered:  Patient presents today for atrial fibrillation and left-sided flank pain.  She is afebrile, nontoxic-appearing, and in no acute distress with reassuring vital signs.  Throughout her stay in the emergency department this evening spanning 7 hours, patient is continued to be in normal sinus rhythm with rates below 100.  She is also denied any chest pain or shortness of breath.  Patient is compliant with her Eliquis regimen as well as her metoprolol and Cardizem.  Given this with normal laboratory evaluation, no further emergent concerns at this time.  I did offer the patient a cardiology consult given that this is her second visit this week for afib, however patient states that she has an appointment tomorrow morning with her cardiologist and prefers to wait for this appointment instead.  Given labs and imaging without emergent concerns I think this is a reasonable plan.   Additionally, patient did express concerns for her left flank pain has been ongoing for several weeks now.  Pain appears to be present over her left rib cage and is reproducible to palpation.  Given patient's overwhelming concerns for this, I ordered a D-dimer, lipase, and hepatic function panel with concerns for PE, pancreatitis, or hepatobiliary abnormality all of which were negative for acute findings. UA did show some blood, however this was present when she had her CT renal 2 weeks ago and it did not show any cause for her symptoms. I have extremely low suspicion that patient would have developed a kidney stone in such short time, therefore will defer repeat imaging at this  time. Given her lack of fevers, dysuria, or overtly infectious urine, low suspicion for pyelonephritis. Patient's description of her symptoms is consistent with shingles, however given that it has been a month and no rashes have appeared, low suspicion for this.  She also states that she has tried muscle relaxers and lidocaine patch with thought this could be musculoskeletal in nature and it has not helped.  She does have an appointment with GI and urology tomorrow to further evaluate her symptoms. She is stable to to be discharged and follow-up with these specialists at her appointment.   Patient is understanding and amenable with plan, educated on red flag symptoms that would prompt immediate return. Discharged in stable condition.  Findings and plan of care discussed with supervising physician Dr. Sherry Ruffing who is in agreement.    Final Clinical Impression(s) / ED Diagnoses Final diagnoses:  Atrial fibrillation, unspecified type ([place])  Flank pain    Rx / DC Orders ED Discharge Orders     None     An After Visit Summary was printed and given to the patient.     Nestor Lewandowsky 03/29/22 1736    Tegeler, Gwenyth Allegra, MD 03/31/22 1041

## 2022-03-29 NOTE — ED Notes (Signed)
Hooked patient up to the monitor patient is resting with call bell in reach 

## 2022-03-29 NOTE — Discharge Instructions (Signed)
As we discussed, your work-up in the ER today was reassuring for acute abnormalities.  Extensive laboratory evaluation did not show any concerns related to your atrial fibrillation or cause of your flank pain.  Given that you have an appointment with cardiology tomorrow, it is sufficient that she follow-up with them to discuss other evaluation and management of your atrial fibrillation to ensure that your heart rate is controlled in the future.  They would likely want to change your medications to help ensure this.  Additionally, discussing the flank pain after GI and urology appointments tomorrow to differentiate the cause of this.  Return if development of any new or worsening symptoms.

## 2022-03-29 NOTE — Progress Notes (Unsigned)
Cardiology Office Note:    Date:  03/30/2022   ID:  Wilma Flavin, DOB [birth year], MRN [id number]  PCP:  Isaac Bliss, Rayford Halsted, MD   [facility] Providers Cardiologist:  Lauree Chandler, MD     Referring MD: Isaac Bliss, Estel*   CC: DOD- AF RVR  History of Present Illness:    WILLOW SHIDLER is a 66 y.o. female with a hx of atrial fibrillation with 03/27/22 ED visit for AF RVR.  Patient notes that she is doing better.   Notes that until this week has not needed her PRN diltiazem.  On Monday she was sitting at her computer and had a run of fast atrial fibrillation. Felt uncomfortable and was worried and went to [facility].  Has another episode yesterday    No chest pain or pressure .  No SOB/DOE and no PND/Orthopnea.  No weight gain or leg swelling.  Feels tired, with near syncope and palpitations when in RVR.  Has been having no abdominal pain over the past five days that is new but she didn't have new pain that was a trigger.    Past Medical History:  Diagnosis Date   Abdominal pain, left upper quadrant    Acute bronchospasm    Acute neck pain 07/02/2019   Acute sinusitis, unspecified 05/20/2009   Centricity Description: SINUSITIS - ACUTE-NOS Qualifier: Diagnosis of  By: Birdie Riddle MD, Belenda Cruise   Centricity Description: ACUTE SINUSITIS, UNSPECIFIED Qualifier: Diagnosis of  By: Alveta Heimlich MD, Eugene   Centricity Description: SINUSITIS, ACUTE Qualifier: Diagnosis of  By: Koleen Nimrod MD, Dellis Filbert     Anxiety    Atypical chest pain 03/05/2013   Body mass index (BMI) of 23.0-23.9 in adult    Cervicalgia    Colon polyp    Costochondral junction syndrome    Cough    DEPRESSION 08/25/2008   Qualifier: Diagnosis of  By: Ronnald Ramp CMA, Chemira     Diarrhea    Diverticulosis 07/27/2012   Dorsalgia    Dysrhythmia    hx a-fib   Elevated blood pressure reading without diagnosis of hypertension    Endometrial polyp 05/2007   benign   Essential hypertension 09/20/2020    Family history of osteoporosis 12/28/2011   Mother pt had normal Dexa 11/2009    Fatigue    GERD (gastroesophageal reflux disease)    HIP PAIN, RIGHT 04/12/2009   Qualifier: Diagnosis of  By: Oneida Alar MD, KARL     History of hiatal hernia    HYPERLIPIDEMIA 08/25/2008   Qualifier: Diagnosis of  By: Birdie Riddle MD, Belenda Cruise     IBS (irritable bowel syndrome)    Left hip pain 07/30/2013   Left wrist pain 07/30/2013   Lumbar strain, initial encounter 07/28/2019   LUNG NODULE 11/19/2009   Neg CT     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 Please review for accuracy.   Otalgia, unspecified ear    Pain in throat    Palpitations    Paroxysmal atrial  fibrillation ([place])    Peroneal tendinitis of right lower extremity 03/12/2014   Plantar wart 04/02/2014   Pneumonia    RHINITIS 06/21/2009   Qualifier: Diagnosis of  By: Birdie Riddle MD, Belenda Cruise     Right ankle injury, subsequent encounter 01/11/2018   Right foot pain 08/09/2011   Sinusitis, chronic    Skene's gland abscess 2009   patient unaware   Sprain of unspecified ligament of right ankle, subsequent encounter    TRANSAMINASES, SERUM, ELEVATED 12/07/2009   Qualifier: Diagnosis of  By: Inda Castle FNP, Melissa S    Unspecified cataract    Urinary tract infection, site not specified    Vitamin D deficiency     Past Surgical History:  Procedure Laterality Date   BROW LIFT Bilateral 12/29/2021   Procedure: BLEPHAROPLASTY;  Surgeon: Lennice Sites, MD;  Location: [facility];  Service: Plastics;  Laterality: Bilateral;   COLONOSCOPY     x several   DILATION AND CURETTAGE OF UTERUS  05/2007   GYNECOLOGIC CRYOSURGERY  1980   HYSTEROSCOPY WITH D & C N/A 03/30/2017   Procedure: DILATATION & CURETTAGE/HYSTEROSCOPY WITH ULTRASOUND GUIDANCE;  Surgeon: Megan Salon, MD;  Location: [facility];  Service: Gynecology;  Laterality: N/A;   UPPER GI ENDOSCOPY     uterine ablation  2007    Current Medications: Current Meds  Medication Sig   apixaban (ELIQUIS) 5 MG TABS tablet Take 1 tablet (5 mg total) by mouth 2 (two) times daily.   azelastine (ASTELIN) 0.1 % nasal spray Place 1 spray into both nostrils daily as needed for allergies or rhinitis.   azelastine (ASTELIN) 0.1 % nasal spray Place 1-2 sprays into both nostrils 2 (two) times daily.   baclofen (LIORESAL) 10 MG tablet Take 1 tablet (10 mg total) by mouth 3 (three) times daily as needed for muscle spasms.   Boswellia-Glucosamine-Vit D (OSTEO BI-FLEX ONE PER DAY PO) Take 1 capsule by mouth daily.   Calcium Carbonate-Vitamin D (CALTRATE 600+D PO) Take 1 tablet by mouth daily.   citalopram (CELEXA) 20 MG tablet Take 1.5 tablets (30 mg total) by mouth daily. (Patient taking differently: Take 20 mg by mouth daily.)   diltiazem (CARDIZEM) 30 MG tablet Take 1 tablet (30 mg total) by mouth 2 (two) times daily if needed for AFIB   famotidine (PEPCID) 10 MG tablet Take 1 tablet (10 mg total) by mouth 2 (two) times daily. (Patient taking differently: Take 10 mg by mouth daily as needed for indigestion or heartburn.)   fluticasone (FLONASE) 50 MCG/ACT nasal spray Place 1-2 sprays into both nostrils daily. (Patient taking differently: Place 1-2 sprays into both nostrils daily as needed for allergies or rhinitis.)   levocetirizine (XYZAL) 5 MG tablet Take 5 mg by mouth every evening.   metoprolol succinate (TOPROL-XL) 25 MG 24 hr tablet Take 1 tablet (25 mg total) by mouth daily. (Patient taking differently: Take 25 mg by mouth  daily at 6 PM.)   Multiple Vitamins-Minerals (MULTI FOR HER 50+) TABS Take 1 tablet by mouth daily.   omeprazole (PRILOSEC) 20 MG capsule Take 1 capsule (20 mg total) by mouth in the morning 30 minutes before morning meal (Patient taking differently: Take 20 mg by mouth daily as needed (acid reflux).)   Probiotic Product (PROBIOTIC PO) Take 1 capsule by mouth daily. Ultra Flora   vitamin C (ASCORBIC ACID) 500 MG tablet Take 500 mg by mouth daily.   [DISCONTINUED] diltiazem (CARDIZEM CD) 120 MG 24 hr capsule Take 1 capsule (120  mg total) by mouth daily.     Allergies:   Augmentin [amoxicillin-pot clavulanate]   Social History   Socioeconomic History   Marital status: Single    Spouse name: Not on file   Number of children: 0   Years of education: Not on file   Highest education level: Not on file  Occupational History   Occupation: Therapist, sports    Employer: [employer]  Tobacco Use   Smoking status: Former    Packs/day: 0.10    Years: 3.00    Total pack years: 0.30    Types: Cigarettes    Quit date: 10/02/1978    Years since quitting: 43.5   Smokeless tobacco: Never  Vaping Use   Vaping Use: Never used  Substance and Sexual Activity   Alcohol use: Yes    Alcohol/week: 7.0 standard drinks of alcohol    Types: 7 Glasses of wine per week    Comment: per week   Drug use: No   Sexual activity: Not Currently    Birth control/protection: Post-menopausal  Other Topics Concern   Not on file  Social History Narrative   Not on file   Social Determinants of Health   Financial Resource Strain: Not on file  Food Insecurity: Not on file  Transportation Needs: Not on file  Physical Activity: Not on file  Stress: Not on file  Social Connections: Not on file     Family History: The patient's family history includes Breast cancer in her paternal grandmother; CAD in her father; Colitis in her father; Colon cancer in her maternal grandfather; Colon polyps in her father and mother;  Diverticulosis in her mother; Glaucoma in her father and mother; Hypertension in her father; Osteoporosis in her mother; Thyroid disease in her mother; Ulcerative colitis in her father. There is no history of Sudden death, Hyperlipidemia, Heart attack, or Diabetes.  ROS:   Please see the history of present illness.     All other systems reviewed and are negative.  EKGs/Labs/Other Studies Reviewed:    The following studies were reviewed today:  EKG:   03/29/22: sinus bradycardia 55  Recent Labs: 03/29/2022: ALT 46; BUN 11; Creatinine, Ser 0.85; Hemoglobin 14.9; Magnesium 2.1; Platelets 317; Potassium 4.2; Sodium 135; TSH 0.539  Recent Lipid Panel    Component Value Date/Time   CHOL 210 (H) 10/10/2021 0923   TRIG 64.0 10/10/2021 0923   HDL 87.70 10/10/2021 0923   CHOLHDL 2 10/10/2021 0923   VLDL 12.8 10/10/2021 0923   LDLCALC 109 (H) 10/10/2021 0923     Risk Assessment/Calculations:    CHA2DS2-VASc Score = 2   This indicates a 2.2% annual risk of stroke. The patient's score is based upon: CHF History: 0 HTN History: 0 Diabetes History: 0 Stroke History: 0 Vascular Disease History: 0 Age Score: 1 Gender Score: 1            Physical Exam:    VS:  BP 112/68   Pulse 63   Ht 5' 4.5" (1.638 m)   Wt 146 lb (66.2 kg)   LMP 10/02/2006 (Approximate)   SpO2 99%   BMI 24.67 kg/m     Wt Readings from Last 3 Encounters:  03/30/22 146 lb (66.2 kg)  03/27/22 135 lb (61.2 kg)  03/08/22 146 lb 12.8 oz (66.6 kg)    GEN:  Well nourished, well developed in no acute distress HEENT: Normal NECK: No JVD; No carotid bruits LYMPHATICS: No lymphadenopathy CARDIAC: RRR, no murmurs, rubs, gallops RESPIRATORY:  Clear  to auscultation without rales, wheezing or rhonchi  ABDOMEN: Soft, non-tender, non-distended MUSCULOSKELETAL:  Trace bilateral edema; No deformity  SKIN: Warm and dry NEUROLOGIC:  Alert and oriented x 3 PSYCHIATRIC:  Normal affect   ASSESSMENT:    1. PAF  (paroxysmal atrial fibrillation) ([place])   2. Acquired thrombophilia ([facility])    PLAN:    Paroxysmal  Atrial Fibrillation,  - Risk factors include age and sex - CHADSVASC=2. - Continue anticoagulation with eliquis; Acquired Thrombophilia - TSH 0.539 - discussed alcohol - discussed exercise as preventive factors - OSA Risk low - Will get obtain TTE.   - Continue rate control with metoprolol  - we will start diltiazem 120 mg Xl for two weeks just as a bridge to getting echo and EP eval; still have PRN diltiazem needed - Rhythm control options have been discussed after our discussion of this vs ablation she would like to see EP for consideration of ablation; I agree this is reasonable and will facilitate this  ADDENDUM: In interim she has called in saying the had return of AF RVR. Discussed taking her medication. And getting the new medication. If this does not work, or if she feels poorly enough that she need ED assist she would need potentially inpatient stress test and flecainide start.  Time Spent Directly with Patient:   I have spent a total of 40 minutes with the patient reviewing notes, imaging, EKGs, labs and examining the patient as well as establishing an assessment and plan that was discussed personally with the patient.  > 50% of time was spent in direct patient care family.      Medication Adjustments/Labs and Tests Ordered: Current medicines are reviewed at length with the patient today.  Concerns regarding medicines are outlined above.  Orders Placed This Encounter  Procedures   Ambulatory referral to Cardiac Electrophysiology   ECHOCARDIOGRAM COMPLETE   Meds ordered this encounter  Medications   DISCONTD: diltiazem (CARDIZEM CD) 120 MG 24 hr capsule    Sig: Take 1 capsule (120 mg total) by mouth daily.    Dispense:  14 capsule    Refill:  0   diltiazem (CARDIZEM CD) 120 MG 24 hr capsule    Sig: Take as directed for 14 days    Dispense:  14 capsule    Refill:  0     Patient Instructions  Medication Instructions:  Your physician has recommended you make the following change in your medication:   TAKE: Cardizem (Diltiazem) 120 mg extended release for 14 days by mouth once daily then stop   *If you need a refill on your cardiac medications before your next appointment, please call your pharmacy*   Lab Work: NONE If you have labs (blood work) drawn today and your tests are completely normal, you will receive your results only by: [place] (if you have MyChart) OR A paper copy in the mail If you have any lab test that is abnormal or we need to change your treatment, we will call you to review the results.   Testing/Procedures: Your physician has requested that you have an echocardiogram. Echocardiography is a painless test that uses sound waves to create images of your heart. It provides your doctor with information about the size and shape of your heart and how well your heart's chambers and valves are working. This procedure takes approximately one hour. There are no restrictions for this procedure.  We have referred you to see our Electrophysiologist (EP) Specialist.  Follow-Up: EP  At [facility], you and your health needs are our priority.  As part of our continuing mission to provide you with exceptional heart care, we have created designated Provider Care Teams.  These Care Teams include your primary Cardiologist (physician) and Advanced Practice Providers (APPs -  Physician Assistants and Nurse Practitioners) who all work together to provide you with the care you need, when you need it.   Important Information About Sugar         Signed, Werner Lean, MD  03/30/2022 12:19 PM    [facility]

## 2022-03-29 NOTE — ED Triage Notes (Addendum)
Pt here with reports of being in afib with HR in the 160's. Reports taking her cardizem without improvement. Pt also complaining of L sided flank pain X1 month.

## 2022-03-30 ENCOUNTER — Other Ambulatory Visit (HOSPITAL_COMMUNITY): Payer: Self-pay

## 2022-03-30 ENCOUNTER — Telehealth: Payer: Self-pay | Admitting: Internal Medicine

## 2022-03-30 ENCOUNTER — Ambulatory Visit: Payer: Medicare HMO | Admitting: Internal Medicine

## 2022-03-30 ENCOUNTER — Encounter: Payer: Self-pay | Admitting: Internal Medicine

## 2022-03-30 ENCOUNTER — Ambulatory Visit (HOSPITAL_COMMUNITY): Payer: Medicare HMO | Admitting: Licensed Clinical Social Worker

## 2022-03-30 DIAGNOSIS — I480 Paroxysmal atrial fibrillation: Secondary | ICD-10-CM | POA: Diagnosis not present

## 2022-03-30 DIAGNOSIS — K219 Gastro-esophageal reflux disease without esophagitis: Secondary | ICD-10-CM | POA: Diagnosis not present

## 2022-03-30 DIAGNOSIS — D6869 Other thrombophilia: Secondary | ICD-10-CM | POA: Diagnosis not present

## 2022-03-30 DIAGNOSIS — K5730 Diverticulosis of large intestine without perforation or abscess without bleeding: Secondary | ICD-10-CM | POA: Diagnosis not present

## 2022-03-30 DIAGNOSIS — Z86010 Personal history of colonic polyps: Secondary | ICD-10-CM | POA: Diagnosis not present

## 2022-03-30 DIAGNOSIS — R3121 Asymptomatic microscopic hematuria: Secondary | ICD-10-CM | POA: Diagnosis not present

## 2022-03-30 DIAGNOSIS — M62830 Muscle spasm of back: Secondary | ICD-10-CM | POA: Diagnosis not present

## 2022-03-30 DIAGNOSIS — Z800 Family history of malignant neoplasm of digestive organs: Secondary | ICD-10-CM | POA: Diagnosis not present

## 2022-03-30 MED ORDER — DILTIAZEM HCL ER COATED BEADS 120 MG PO CP24
120.0000 mg | ORAL_CAPSULE | Freq: Every day | ORAL | 0 refills | Status: DC
Start: 2022-03-30 — End: 2022-03-30
  Filled 2022-03-30: qty 14, 14d supply, fill #0

## 2022-03-30 MED ORDER — DILTIAZEM HCL ER COATED BEADS 120 MG PO CP24
ORAL_CAPSULE | ORAL | 0 refills | Status: DC
  Filled 2022-03-30: qty 14, 14d supply, fill #0

## 2022-03-30 NOTE — Telephone Encounter (Signed)
Patient c/o Palpitations:  High priority if patient c/o lightheadedness, shortness of breath, or chest pain  How long have you had palpitations/irregular HR/ Afib? About an hour. Patient just got home from the office  Are you having the symptoms now? Yes  Are you currently experiencing lightheadedness, SOB or CP? no  Do you have a history of afib (atrial fibrillation) or irregular heart rhythm?   Have you checked your BP or HR? (document readings if available): 150 when she got home , now it's lower  Are you experiencing any other symptoms? Woozy  Patient said she was just at the office but came home and went into afib

## 2022-03-30 NOTE — Telephone Encounter (Signed)
Call sent straight to triage. Patient complaining of being back in A. FIB and HR 150. Patient was just seen by Dr. Gasper Sells this morning. Patient has been referred to EP and started on Cardizem 120 mg daily.  Patient has taken Cardizem 30 mg about 15 minutes ago. Patient has not picked up new prescription of Cardizem 120 mg yet. Informed patient to get the Cardizem 120 mg and start taking it, to get it in her system. Encouraged patient to give Cardizem 30 mg time to work. Informed patient that Dr. Gasper Sells would be consulted and if there was any additional advisement our office would call her back. Patient verbalized understanding. Dr. Gasper Sells advised no additional changes, and if symptoms do not improve with medications, see about moving up EP appointment.

## 2022-03-30 NOTE — Patient Instructions (Signed)
Medication Instructions:  Your physician has recommended you make the following change in your medication:   TAKE: Cardizem (Diltiazem) 120 mg extended release for 14 days by mouth once daily then stop   *If you need a refill on your cardiac medications before your next appointment, please call your pharmacy*   Lab Work: NONE If you have labs (blood work) drawn today and your tests are completely normal, you will receive your results only by: [place] (if you have MyChart) OR A paper copy in the mail If you have any lab test that is abnormal or we need to change your treatment, we will call you to review the results.   Testing/Procedures: Your physician has requested that you have an echocardiogram. Echocardiography is a painless test that uses sound waves to create images of your heart. It provides your doctor with information about the size and shape of your heart and how well your heart's chambers and valves are working. This procedure takes approximately one hour. There are no restrictions for this procedure.  We have referred you to see our Electrophysiologist (EP) Specialist.     Follow-Up: EP  At [facility], you and your health needs are our priority.  As part of our continuing mission to provide you with exceptional heart care, we have created designated Provider Care Teams.  These Care Teams include your primary Cardiologist (physician) and Advanced Practice Providers (APPs -  Physician Assistants and Nurse Practitioners) who all work together to provide you with the care you need, when you need it.   Important Information About Sugar

## 2022-03-31 ENCOUNTER — Telehealth: Payer: Self-pay | Admitting: *Deleted

## 2022-03-31 ENCOUNTER — Other Ambulatory Visit (HOSPITAL_COMMUNITY): Payer: Self-pay

## 2022-03-31 ENCOUNTER — Ambulatory Visit (HOSPITAL_COMMUNITY): Payer: Medicare HMO

## 2022-03-31 ENCOUNTER — Encounter: Payer: Self-pay | Admitting: Cardiology

## 2022-03-31 ENCOUNTER — Encounter (HOSPITAL_COMMUNITY): Payer: Self-pay

## 2022-03-31 ENCOUNTER — Telehealth: Payer: Self-pay | Admitting: Internal Medicine

## 2022-03-31 DIAGNOSIS — I480 Paroxysmal atrial fibrillation: Secondary | ICD-10-CM

## 2022-03-31 DIAGNOSIS — Z79899 Other long term (current) drug therapy: Secondary | ICD-10-CM

## 2022-03-31 MED ORDER — FLECAINIDE ACETATE 50 MG PO TABS
50.0000 mg | ORAL_TABLET | Freq: Two times a day (BID) | ORAL | 3 refills | Status: DC
  Filled 2022-03-31: qty 180, 90d supply, fill #0
  Filled 2022-06-22: qty 180, 90d supply, fill #1
  Filled 2022-09-26: qty 180, 90d supply, fill #2
  Filled 2023-01-26: qty 180, 90d supply, fill #3

## 2022-03-31 NOTE — Telephone Encounter (Signed)
Patient called stating she gave the wrong HR reading in the previous telephone encounter. The correct HR reading is 140.

## 2022-03-31 NOTE — Telephone Encounter (Signed)
Patient seen by DOD today (Dr. Lovena Le) Seen by me 03/30/22 DOD for PAF. Had return of AF RVR despite starting diltiazem. TTE was cancelled and her metoprolol was increased.  I suspect she will need trial of anti-arrhythmic drug.    Discussed with DOD who agreed that flecainide was reasonable.  Will recommend flecainide 50 mg PO BID then outpatient stress test unless concerns from patients primary cardiologist or EP who will be seeing this patient (04/12/22).  Left message for patient; unable to reach via cell phone.  Rudean Haskell, MD [place]  [address] [place], [place] [address] [phone]  11:11 AM

## 2022-03-31 NOTE — Telephone Encounter (Signed)
Called patient to let her know we were going to add Flecainide 50 mg BID, and do an outpatient stress test. Patient stated she wants clarification on if she needs to continue the diltazem 120 mg daily, metoprolol 25 mg daily and start the Flecainide 50 BID. Patient took diltazem today and took metoprolol about an hour ago. Her current heart rate is 74. Will forward to Dr. Gasper Sells to clarify.

## 2022-03-31 NOTE — Telephone Encounter (Signed)
   Pre-operative Risk Assessment    Patient Name: Kara Warren  DOB: 02/20/1956 MRN: [id number]      Request for Surgical Clearance    Procedure:   COLONOSCOPY   Date of Surgery:  Clearance 04/25/22                                 Surgeon:  DR. MANN Surgeon's Group or Practice Name:  [facility] Phone number:  [phone] Fax number:  [phone]   Type of Clearance Requested:   - Pharmacy:  Hold Apixaban (Eliquis) NOT INDICATED   Type of Anesthesia:   PROPOFOL   Additional requests/questions:    Astrid Divine   03/31/2022, 7:18 AM

## 2022-03-31 NOTE — Telephone Encounter (Signed)
Called patient back with Dr. Oralia Rud recommendation, take diltiazem, metoprolol and flecainide. Patient verbalized understanding.

## 2022-03-31 NOTE — Addendum Note (Signed)
Addended by: Aris Georgia, Xenia Nile L on: 03/31/2022 02:01 PM   Modules accepted: Orders

## 2022-03-31 NOTE — Progress Notes (Unsigned)
Patient ID: Kara Warren, female   DOB: May 31, 1956, 66 y.o.   MRN: [id number]  Consulted with Dr. Lovena Le (DOD), in reference to patient's heart rate in the 140's. Dr. Lovena Le consulted with patient. Echo to be cancelled for today, will be rescheduled when heart rate is better controlled.

## 2022-04-02 ENCOUNTER — Telehealth: Payer: Self-pay | Admitting: Cardiology

## 2022-04-02 NOTE — Telephone Encounter (Signed)
Patient called in reporting her HR remains elevated this weekend. In review of notes she is being treated for atrial fibrillation. Recent office visit 6/29 noted, currently on Toprol XL '25mg'$ , Diltiazem '120mg'$  daily, along with Flecainide '50mg'$  BID and Eliquis. Says her HR has maintained in the 140s over the weekend. Overall, does not necessarily feel symptomatic but doesn't feel herself. No chest pain, shortness of breath, dizziness etc. I advised we can attempt to titrate her meds more, increase Toprol to '50mg'$  daily, continue Dilt/flecainide in attempts to improve the HR. If she becomes symptomatic advised she will need to present to the ED. Otherwise advised I would route to MD/RN to follow up regarding further management as timeframe of treatment may need to be adjusted. Of note has EP appt scheduled for 7/12. She was agreeable to try adjustments and thanked me for call back.

## 2022-04-03 ENCOUNTER — Telehealth: Payer: Self-pay | Admitting: Internal Medicine

## 2022-04-03 DIAGNOSIS — I480 Paroxysmal atrial fibrillation: Secondary | ICD-10-CM

## 2022-04-03 NOTE — Addendum Note (Signed)
Addended by: Rudean Haskell A on: 04/03/2022 02:35 PM   Modules accepted: Orders

## 2022-04-03 NOTE — Telephone Encounter (Signed)
Called Patient in regard to fast heart beats.  Despite Flecainaide BB and CCB is at a heart rate of 140 - she could potentially be in AFL instead (was in sinus when I saw her) - if she feels terrible, advised ED eval and likely admission - otherwise we should schedule her for Cardioversion.  I consented her over the phone Team- please add her on for Wednesday. After, I will place cardioversion orders.  Thanks,   Rudean Haskell, MD [place], [address] [place], [place] [address] [phone]  1:38 PM

## 2022-04-05 ENCOUNTER — Encounter: Payer: Self-pay | Admitting: Cardiovascular Disease

## 2022-04-05 ENCOUNTER — Ambulatory Visit (INDEPENDENT_AMBULATORY_CARE_PROVIDER_SITE_OTHER): Payer: Medicare HMO | Admitting: Cardiovascular Disease

## 2022-04-05 ENCOUNTER — Encounter: Payer: Self-pay | Admitting: Family Medicine

## 2022-04-05 ENCOUNTER — Other Ambulatory Visit: Payer: Medicare HMO | Admitting: *Deleted

## 2022-04-05 ENCOUNTER — Ambulatory Visit: Payer: Medicare HMO | Admitting: Family Medicine

## 2022-04-05 VITALS — BP 100/66 | HR 46 | Ht 64.5 in | Wt 146.0 lb

## 2022-04-05 DIAGNOSIS — I480 Paroxysmal atrial fibrillation: Secondary | ICD-10-CM

## 2022-04-05 DIAGNOSIS — S39012A Strain of muscle, fascia and tendon of lower back, initial encounter: Secondary | ICD-10-CM | POA: Diagnosis not present

## 2022-04-05 MED ORDER — KETOROLAC TROMETHAMINE 60 MG/2ML IM SOLN
60.0000 mg | Freq: Once | INTRAMUSCULAR | Status: AC
  Administered 2022-04-05: 60 mg via INTRAMUSCULAR

## 2022-04-05 NOTE — Telephone Encounter (Signed)
   Patient Name: Kara Warren  DOB: [birth year] MRN: [id number]  Primary Cardiologist: Lauree Chandler, MD  Chart reviewed as part of pre-operative protocol coverage.   Patient has an appointment with Dr. Curt Bears on 04/12/2022 to discuss possible ablation. If ablation is scheduled, pt will not be able to stop anticoag for 3 weeks before and 12 weeks after ablation.  Spoke with patient who states she will likely postpone her colonoscopy pending possible ablation.  For now, will leave request and preop pool with note to review note from OV with Dr. Curt Bears to determine whether or not patient will proceed with ablation vs colonoscopy.    Lenna Sciara, NP 04/05/2022, 12:34 PM

## 2022-04-05 NOTE — Telephone Encounter (Signed)
Patient with diagnosis of afib on Eliquis for anticoagulation.    Procedure: colonoscopy Date of procedure: 04/25/22  CHA2DS2-VASc Score = 3  This indicates a 3.2% annual risk of stroke. The patient's score is based upon: CHF History: 0 HTN History: 1 Diabetes History: 0 Stroke History: 0 Vascular Disease History: 0 Age Score: 1 Gender Score: 1   CrCl 80m/min Platelet count 317K  Per office protocol, patient can hold Eliquis for 1-2 days prior to procedure. However, pt has an appt to discuss ablation with Dr CCurt Bearson 7/12. If ablation is scheduled, pt will not be able to stop anticoag for 3 weeks before and 12 weeks after ablation.  **This guidance is not considered finalized until pre-operative APP has relayed final recommendations.**

## 2022-04-05 NOTE — Telephone Encounter (Signed)
Patient come in this morning for lab work and ended up seeing DOD, Dr. Acie Fredrickson. Please see office visit for details.

## 2022-04-05 NOTE — Patient Instructions (Signed)
Medication Instructions:  Continue holding metoprolol, Cardizem and Flecainide until instructed otherwise.  *If you need a refill on your cardiac medications before your next appointment, please call your pharmacy*  Testing/Procedures: Your physician has requested that you have an echocardiogram. Echocardiography is a painless test that uses sound waves to create images of your heart. It provides your doctor with information about the size and shape of your heart and how well your heart's chambers and valves are working. This procedure takes approximately one hour. There are no restrictions for this procedure.    Follow-Up: At [facility], you and your health needs are our priority.  As part of our continuing mission to provide you with exceptional heart care, we have created designated Provider Care Teams.  These Care Teams include your primary Cardiologist (physician) and Advanced Practice Providers (APPs -  Physician Assistants and Nurse Practitioners) who all work together to provide you with the care you need, when you need it.  We recommend signing up for the patient portal called "MyChart".  Sign up information is provided on this After Visit Summary.  MyChart is used to connect with patients for Virtual Visits (Telemedicine).  Patients are able to view lab/test results, encounter notes, upcoming appointments, etc.  Non-urgent messages can be sent to your provider as well.   To learn more about what you can do with MyChart, go to [email or web address].    Your next appointment:   04/12/22  The format for your next appointment:   In Person  Provider:   Allegra Lai, MD    Important Information About Sugar

## 2022-04-05 NOTE — Progress Notes (Signed)
Cardiology Office Note:    Date:  04/05/2022   ID:  Kara Warren, DOB [birth year], MRN [id number]  PCP:  Isaac Bliss, Rayford Halsted, MD   [place] Providers Cardiologist:  Lauree Chandler, MD     Referring MD: Isaac Bliss, Estel*   Chief Complaint  Patient presents with   Bradycardia   Atrial Fibrillation         History of Present Illness:    Kara Warren is a 66 y.o. female with a hx of atrial fib, atypical CP ,   Seen as a work in visit with husband , [name]  Dx of PAF 2 years ago  Has been asymptomatic  Last Monday , she went into Afib. Went to ER at [facility] ,  she converted back to NSR  Several days later had recurrent Afib ,   Saw Dr. Gasper Sells,   was started on Dilt CD Flecainide was started the following day due to elevated HR  Toprol XL was increased from 25 QD to 25 BID .  Converted to NSR about 3 days after flecaniide was started.  Restared Eliquis several weeks ago   The most recent episode lasted several days. Has fatigue,  No syncope   She has not taken any meds this am Yesterday she took Flecainide in the am Cardiazem 120 Toprol XL 25 in am and 12.5 mg in the PM  Has appt with Dr. Curt Bears in July 12.     Past Medical History:  Diagnosis Date   Abdominal pain, left upper quadrant    Acute bronchospasm    Acute neck pain 07/02/2019   Acute sinusitis, unspecified 05/20/2009   Centricity Description: SINUSITIS - ACUTE-NOS Qualifier: Diagnosis of  By: Birdie Riddle MD, Belenda Cruise   Centricity Description: ACUTE SINUSITIS, UNSPECIFIED Qualifier: Diagnosis of  By: Alveta Heimlich MD, Cornelia Copa   Centricity Description: SINUSITIS, ACUTE Qualifier: Diagnosis of  By: Koleen Nimrod MD, Dellis Filbert     Anxiety    Atypical chest pain 03/05/2013   Body mass index (BMI) of 23.0-23.9 in adult    Cervicalgia    Colon polyp    Costochondral junction syndrome    Cough    DEPRESSION 08/25/2008   Qualifier: Diagnosis of  By: Ronnald Ramp CMA, Chemira      Diarrhea    Diverticulosis 07/27/2012   Dorsalgia    Dysrhythmia    hx a-fib   Elevated blood pressure reading without diagnosis of hypertension    Endometrial polyp 05/2007   benign   Essential hypertension 09/20/2020   Family history of osteoporosis 12/28/2011   Mother pt had normal Dexa 11/2009    Fatigue    GERD (gastroesophageal reflux disease)    HIP PAIN, RIGHT 04/12/2009   Qualifier: Diagnosis of  By: Oneida Alar MD, KARL     History of hiatal hernia    HYPERLIPIDEMIA 08/25/2008   Qualifier: Diagnosis of  By: Birdie Riddle MD, Belenda Cruise     IBS (irritable bowel syndrome)    Left hip pain 07/30/2013   Left wrist pain 07/30/2013   Lumbar strain, initial encounter 07/28/2019   LUNG NODULE 11/19/2009   Neg CT     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 Please review for accuracy.   Otalgia, unspecified ear    Pain in throat    Palpitations    Paroxysmal atrial fibrillation (HCC)    Peroneal tendinitis of right lower extremity 03/12/2014   Plantar wart 04/02/2014   Pneumonia    RHINITIS 06/21/2009   Qualifier: Diagnosis of  By: Birdie Riddle MD, Belenda Cruise     Right ankle injury, subsequent encounter 01/11/2018   Right foot pain 08/09/2011   Sinusitis, chronic    Skene's gland abscess 2009   patient unaware   Sprain of unspecified ligament of right ankle, subsequent encounter    TRANSAMINASES, SERUM, ELEVATED 12/07/2009   Qualifier: Diagnosis of  By: Inda Castle FNP, Melissa S    Unspecified cataract    Urinary  tract infection, site not specified    Vitamin D deficiency     Past Surgical History:  Procedure Laterality Date   BROW LIFT Bilateral 12/29/2021   Procedure: BLEPHAROPLASTY;  Surgeon: Lennice Sites, MD;  Location: [employer];  Service: Plastics;  Laterality: Bilateral;   COLONOSCOPY     x several   DILATION AND CURETTAGE OF UTERUS  05/2007   GYNECOLOGIC CRYOSURGERY  1980   HYSTEROSCOPY WITH D & C N/A 03/30/2017   Procedure: DILATATION & CURETTAGE/HYSTEROSCOPY WITH ULTRASOUND GUIDANCE;  Surgeon: Megan Salon, MD;  Location: [facility];  Service: Gynecology;  Laterality: N/A;   UPPER GI ENDOSCOPY     uterine ablation  2007    Current Medications: Current Meds  Medication Sig   apixaban (ELIQUIS) 5 MG TABS tablet Take 1 tablet (5 mg total) by mouth 2 (two) times daily.   azelastine (ASTELIN) 0.1 % nasal spray Place 1 spray into both nostrils daily as needed for allergies or rhinitis.   azelastine (ASTELIN) 0.1 % nasal spray Place 1-2 sprays into both nostrils 2 (two) times daily.   baclofen (LIORESAL) 10 MG tablet Take 1 tablet (10 mg total) by mouth 3 (three) times daily as needed for muscle spasms.   Boswellia-Glucosamine-Vit D (OSTEO BI-FLEX ONE PER DAY PO) Take 1 capsule by mouth daily.   Calcium Carbonate-Vitamin D (CALTRATE 600+D PO) Take 1 tablet by mouth daily.   citalopram (CELEXA) 20 MG tablet Take 1.5 tablets (30 mg total) by mouth daily. (Patient taking differently: Take 20 mg by mouth daily.)   diltiazem (CARDIZEM CD) 120 MG 24 hr capsule Take as directed for 14 days   diltiazem (CARDIZEM) 30 MG tablet Take 1 tablet (30 mg total) by mouth 2 (two) times daily if needed for AFIB   famotidine (PEPCID) 10 MG tablet Take 1 tablet (10 mg total) by mouth 2 (two) times daily. (Patient taking differently: Take 10 mg by mouth daily as needed for indigestion or heartburn.)   flecainide (TAMBOCOR) 50 MG tablet Take 1 tablet (50 mg total) by mouth 2 (two) times daily.   fluticasone (FLONASE)  50 MCG/ACT nasal spray Place 1-2 sprays into both nostrils daily. (Patient taking differently: Place 1-2 sprays into both nostrils daily as needed for allergies or rhinitis.)   levocetirizine (XYZAL) 5 MG tablet Take 5 mg by mouth every evening.   methylPREDNISolone (MEDROL DOSEPAK) 4 MG TBPK tablet Take as directed   methylPREDNISolone (MEDROL DOSEPAK) 4 MG TBPK tablet Take as directed on package.   metoprolol succinate (TOPROL-XL) 25  MG 24 hr tablet Take 1 tablet (25 mg total) by mouth daily. (Patient taking differently: Take 25 mg by mouth 2 (two) times daily.)   Multiple Vitamins-Minerals (MULTI FOR HER 50+) TABS Take 1 tablet by mouth daily.   omeprazole (PRILOSEC) 20 MG capsule Take 1 capsule (20 mg total) by mouth in the morning 30 minutes before morning meal (Patient taking differently: Take 20 mg by mouth daily as needed (acid reflux).)   Probiotic Product (PROBIOTIC PO) Take 1 capsule by mouth daily. Ultra Flora   vitamin C (ASCORBIC ACID) 500 MG tablet Take 500 mg by mouth daily.   zinc gluconate 50 MG tablet Take 50 mg by mouth daily.     Allergies:   Augmentin [amoxicillin-pot clavulanate]   Social History   Socioeconomic History   Marital status: Single    Spouse name: Not on file   Number of children: 0   Years of education: Not on file   Highest education level: Not on file  Occupational History   Occupation: Therapist, sports    Employer: [employer]  Tobacco Use   Smoking status: Former    Packs/day: 0.10    Years: 3.00    Total pack years: 0.30    Types: Cigarettes    Quit date: 10/02/1978    Years since quitting: 43.5   Smokeless tobacco: Never  Vaping Use   Vaping Use: Never used  Substance and Sexual Activity   Alcohol use: Yes    Alcohol/week: 7.0 standard drinks of alcohol    Types: 7 Glasses of wine per week    Comment: per week   Drug use: No   Sexual activity: Not Currently    Birth control/protection: Post-menopausal  Other Topics Concern   Not on file  Social  History Narrative   Not on file   Social Determinants of Health   Financial Resource Strain: Not on file  Food Insecurity: Not on file  Transportation Needs: Not on file  Physical Activity: Not on file  Stress: Not on file  Social Connections: Not on file     Family History: The patient's family history includes Breast cancer in her paternal grandmother; CAD in her father; Colitis in her father; Colon cancer in her maternal grandfather; Colon polyps in her father and mother; Diverticulosis in her mother; Glaucoma in her father and mother; Hypertension in her father; Osteoporosis in her mother; Thyroid disease in her mother; Ulcerative colitis in her father. There is no history of Sudden death, Hyperlipidemia, Heart attack, or Diabetes.  ROS:   Please see the history of present illness.     All other systems reviewed and are negative.  EKGs/Labs/Other Studies Reviewed:    The following studies were reviewed today:   EKG: April 05, 2022: Sinus bradycardia at 46.  Nonspecific ST and T wave changes.  Recent Labs: 03/29/2022: ALT 46; BUN 11; Creatinine, Ser 0.85; Hemoglobin 14.9; Magnesium 2.1; Platelets 317; Potassium 4.2; Sodium 135; TSH 0.539  Recent Lipid Panel    Component Value Date/Time   CHOL 210 (H) 10/10/2021 0923   TRIG 64.0 10/10/2021 0923   HDL 87.70 10/10/2021 0923   CHOLHDL 2 10/10/2021 0923   VLDL 12.8 10/10/2021 0923   LDLCALC 109 (H) 10/10/2021 0923     Risk Assessment/Calculations:    CHA2DS2-VASc Score = 3    This indicates a 3.2% annual risk of stroke. The patient's score is based upon: CHF History: 0 HTN History: 1 Diabetes History: 0 Stroke History: 0 Vascular Disease  History: 0 Age Score: 1 Gender Score: 1           Physical Exam:    VS:  BP 100/66   Pulse (!) 46   Ht 5' 4.5" (1.638 m)   Wt 146 lb (66.2 kg)   LMP 10/02/2006 (Approximate)   BMI 24.67 kg/m     Wt Readings from Last 3 Encounters:  04/05/22 140 lb (63.5 kg)  04/05/22  146 lb (66.2 kg)  03/30/22 146 lb (66.2 kg)     GEN:  Well nourished, well developed in no acute distress HEENT: Normal NECK: No JVD; No carotid bruits LYMPHATICS: No lymphadenopathy CARDIAC: RRR, no murmurs, rubs, gallops RESPIRATORY:  Clear to auscultation without rales, wheezing or rhonchi  ABDOMEN: Soft, non-tender, non-distended MUSCULOSKELETAL:  No edema; No deformity  SKIN: Warm and dry NEUROLOGIC:  Alert and oriented x 3 PSYCHIATRIC:  Normal affect   ASSESSMENT:    1. PAF (paroxysmal atrial fibrillation) (HCC)    PLAN:    In order of problems listed above:  Paroxysmal atrial fibrillation: She presents for further evaluation and management of her paroxysmal atrial fibrillation.  Over the past week she has been started on diltiazem 120 mg CD.  Flecainide 50 mg twice a day, and her metoprolol has been increased to 25 mg twice a day.  She converted back to normal yesterday her heart rate has been very slow.  Her heart rate is 46 today despite the fact that she has not had any medications since last night.  I called Latacha back later in the evening of her office visit.  Her heart rate was up to 64. I have asked her to restart Toprol-XL 25 mg a day starting tonight. She is to restart flecainide 50 mg twice a day starting tomorrow morning. If she wakes up with atrial fibrillation tonight then she is to restart the flecainide also tonight.  She has an echocardiogram scheduled for tomorrow.  She needs to continue the Eliquis.  She has an appointment to see Dr. Curt Bears in 1 week to discuss ablation.    Medication Adjustments/Labs and Tests Ordered: Current medicines are reviewed at length with the patient today.  Concerns regarding medicines are outlined above.  Orders Placed This Encounter  Procedures   EKG 12-Lead   No orders of the defined types were placed in this encounter.   Patient Instructions  Medication Instructions:  Continue holding metoprolol, Cardizem and  Flecainide until instructed otherwise.  *If you need a refill on your cardiac medications before your next appointment, please call your pharmacy*  Testing/Procedures: Your physician has requested that you have an echocardiogram. Echocardiography is a painless test that uses sound waves to create images of your heart. It provides your doctor with information about the size and shape of your heart and how well your heart's chambers and valves are working. This procedure takes approximately one hour. There are no restrictions for this procedure.    Follow-Up: At [facility], you and your health needs are our priority.  As part of our continuing mission to provide you with exceptional heart care, we have created designated Provider Care Teams.  These Care Teams include your primary Cardiologist (physician) and Advanced Practice Providers (APPs -  Physician Assistants and Nurse Practitioners) who all work together to provide you with the care you need, when you need it.  We recommend signing up for the patient portal called "MyChart".  Sign up information is provided on this After Visit Summary.  MyChart  is used to connect with patients for Virtual Visits (Telemedicine).  Patients are able to view lab/test results, encounter notes, upcoming appointments, etc.  Non-urgent messages can be sent to your provider as well.   To learn more about what you can do with MyChart, go to [email or web address].    Your next appointment:   04/12/22  The format for your next appointment:   In Person  Provider:   Allegra Lai, MD    Important Information About Sugar         Signed, Mertie Moores, MD  04/05/2022 5:27 PM    [place]

## 2022-04-05 NOTE — Patient Instructions (Signed)
You have a severe strain of a muscle in your low back. You were given an IM injection of toradol today. Continue tylenol, baclofen, topical patches/medicines. Heat 15 minutes at a time 3-4 times a day. Keep appointment on Friday - we can repeat the toradol if we need to. If you're doing well you can cancel the appointment then.

## 2022-04-05 NOTE — Progress Notes (Signed)
PCP: Isaac Bliss, Rayford Halsted, MD  Subjective:   HPI: Patient is a 66 y.o. female here for left hip pain.  Patient reports she's had about 2 days of worsening left low back/posterior hip pain. No new injury or trauma. No radiation into legs. No numbness or tingling. No bowel/bladder dysfunction. Has tried tylenol, topical patches. Worse with movement.  Past Medical History:  Diagnosis Date   Abdominal pain, left upper quadrant    Acute bronchospasm    Acute neck pain 07/02/2019   Acute sinusitis, unspecified 05/20/2009   Centricity Description: SINUSITIS - ACUTE-NOS Qualifier: Diagnosis of  By: Birdie Riddle MD, Belenda Cruise   Centricity Description: ACUTE SINUSITIS, UNSPECIFIED Qualifier: Diagnosis of  By: Alveta Heimlich MD, Eugene   Centricity Description: SINUSITIS, ACUTE Qualifier: Diagnosis of  By: Koleen Nimrod MD, Dellis Filbert     Anxiety    Atypical chest pain 03/05/2013   Body mass index (BMI) of 23.0-23.9 in adult    Cervicalgia    Colon polyp    Costochondral junction syndrome    Cough    DEPRESSION 08/25/2008   Qualifier: Diagnosis of  By: Ronnald Ramp CMA, Chemira     Diarrhea    Diverticulosis 07/27/2012   Dorsalgia    Dysrhythmia    hx a-fib   Elevated blood pressure reading without diagnosis of hypertension    Endometrial polyp 05/2007   benign   Essential hypertension 09/20/2020   Family history of osteoporosis 12/28/2011   Mother pt had normal Dexa 11/2009    Fatigue    GERD (gastroesophageal reflux disease)    HIP PAIN, RIGHT 04/12/2009   Qualifier: Diagnosis of  By: Oneida Alar MD, KARL     History of hiatal hernia    HYPERLIPIDEMIA 08/25/2008   Qualifier: Diagnosis of  By: Birdie Riddle MD, Belenda Cruise     IBS (irritable bowel syndrome)    Left hip pain 07/30/2013   Left wrist pain 07/30/2013   Lumbar strain, initial encounter 07/28/2019   LUNG NODULE 11/19/2009   Neg CT     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 Please review for accuracy.   Otalgia, unspecified ear    Pain in throat    Palpitations    Paroxysmal atrial fibrillation (HCC)    Peroneal tendinitis of right lower extremity 03/12/2014   Plantar wart 04/02/2014   Pneumonia    RHINITIS 06/21/2009   Qualifier: Diagnosis of  By: Birdie Riddle MD, Belenda Cruise     Right ankle injury, subsequent encounter 01/11/2018   Right foot pain 08/09/2011   Sinusitis, chronic    Skene's gland abscess 2009   patient unaware   Sprain of unspecified ligament of right ankle, subsequent encounter    TRANSAMINASES, SERUM, ELEVATED 12/07/2009   Qualifier: Diagnosis of  By: Inda Castle FNP, Melissa S    Unspecified cataract    Urinary tract infection, site not specified    Vitamin D deficiency     Current Outpatient Medications on File Prior to Visit  Medication Sig Dispense Refill  apixaban (ELIQUIS) 5 MG TABS tablet Take 1 tablet (5 mg total) by mouth 2 (two) times daily. 60 tablet 11   azelastine (ASTELIN) 0.1 % nasal spray Place 1 spray into both nostrils daily as needed for allergies or rhinitis.     azelastine (ASTELIN) 0.1 % nasal spray Place 1-2 sprays into both nostrils 2 (two) times daily. 30 mL 3   baclofen (LIORESAL) 10 MG tablet Take 1 tablet (10 mg total) by mouth 3 (three) times daily as needed for muscle spasms. 60 each 1   Boswellia-Glucosamine-Vit D (OSTEO BI-FLEX ONE PER DAY PO) Take 1 capsule by mouth daily.     Calcium Carbonate-Vitamin D (CALTRATE 600+D PO) Take 1 tablet by mouth daily.     citalopram (CELEXA) 20 MG tablet Take 1.5  tablets (30 mg total) by mouth daily. (Patient taking differently: Take 20 mg by mouth daily.) 145 tablet 1   diltiazem (CARDIZEM CD) 120 MG 24 hr capsule Take as directed for 14 days 14 capsule 0   diltiazem (CARDIZEM) 30 MG tablet Take 1 tablet (30 mg total) by mouth 2 (two) times daily if needed for AFIB 45 tablet 1   famotidine (PEPCID) 10 MG tablet Take 1 tablet (10 mg total) by mouth 2 (two) times daily. (Patient taking differently: Take 10 mg by mouth daily as needed for indigestion or heartburn.) 90 tablet 0   flecainide (TAMBOCOR) 50 MG tablet Take 1 tablet (50 mg total) by mouth 2 (two) times daily. 180 tablet 3   fluticasone (FLONASE) 50 MCG/ACT nasal spray Place 1-2 sprays into both nostrils daily. (Patient taking differently: Place 1-2 sprays into both nostrils daily as needed for allergies or rhinitis.) 16 g 3   levocetirizine (XYZAL) 5 MG tablet Take 5 mg by mouth every evening.     methylPREDNISolone (MEDROL DOSEPAK) 4 MG TBPK tablet Take as directed 21 each 0   methylPREDNISolone (MEDROL DOSEPAK) 4 MG TBPK tablet Take as directed on package. 21 each 0   metoprolol succinate (TOPROL-XL) 25 MG 24 hr tablet Take 1 tablet (25 mg total) by mouth daily. (Patient taking differently: Take 25 mg by mouth 2 (two) times daily.) 90 tablet 1   Multiple Vitamins-Minerals (MULTI FOR HER 50+) TABS Take 1 tablet by mouth daily.     omeprazole (PRILOSEC) 20 MG capsule Take 1 capsule (20 mg total) by mouth in the morning 30 minutes before morning meal (Patient taking differently: Take 20 mg by mouth daily as needed (acid reflux).) 90 capsule 0   Probiotic Product (PROBIOTIC PO) Take 1 capsule by mouth daily. Ultra Flora     vitamin C (ASCORBIC ACID) 500 MG tablet Take 500 mg by mouth daily.     zinc gluconate 50 MG tablet Take 50 mg by mouth daily.     No current facility-administered medications on file prior to visit.    Past Surgical History:  Procedure Laterality Date   BROW LIFT Bilateral  12/29/2021   Procedure: BLEPHAROPLASTY;  Surgeon: Lennice Sites, MD;  Location: [facility];  Service: Plastics;  Laterality: Bilateral;   COLONOSCOPY     x several   DILATION AND CURETTAGE OF UTERUS  05/2007   GYNECOLOGIC CRYOSURGERY  1980   HYSTEROSCOPY WITH D & C N/A 03/30/2017   Procedure: DILATATION & CURETTAGE/HYSTEROSCOPY WITH ULTRASOUND GUIDANCE;  Surgeon: Megan Salon, MD;  Location: [facility];  Service: Gynecology;  Laterality: N/A;   UPPER GI ENDOSCOPY     uterine ablation  2007  Allergies  Allergen Reactions   Augmentin [Amoxicillin-Pot Clavulanate] Diarrhea    BP 120/60   Ht 5' 4.5" (1.638 m)   Wt 140 lb (63.5 kg)   LMP 10/02/2006 (Approximate)   BMI 23.66 kg/m      08/30/2020    9:03 AM 06/08/2021    9:34 AM 08/22/2021   10:20 AM 11/09/2021   10:19 AM  [facility] Adult Exercise  Frequency of aerobic exercise (# of days/week) '7 7 7 6  '$ Average time in minutes 60 60 60 60  Frequency of strengthening activities (# of days/week) '3 3 2 3        '$ No data to display              Objective:  Physical Exam:  Gen: NAD, comfortable in exam room  Back: No gross deformity, scoliosis. TTP left lumbar paraspinal region.  No midline or bony TTP. Limited flexion and extension secondary to pain.  Normal trunk rotation. Strength LEs 5/5 all muscle groups.   2+ MSRs in patellar and achilles tendons, equal bilaterally. Negative SLRs. Sensation intact to light touch bilaterally.  Left hip: No deformity. FROM with 5/5 strength. No tenderness to palpation. NVI distally. Negative logroll Negative faber, fadir, and piriformis stretches.   Assessment & Plan:  1. Low back pain - 2/2 lumbar strain.  IM toradol given today.  Continue tylenol, topical patches/medications.  Encouraged her baclofen.  Heat.  Consider repeating toradol if needed.  Home stretches.  Consider physical therapy.

## 2022-04-06 ENCOUNTER — Ambulatory Visit (HOSPITAL_COMMUNITY): Payer: Medicare HMO | Attending: Internal Medicine

## 2022-04-06 ENCOUNTER — Other Ambulatory Visit (HOSPITAL_COMMUNITY): Payer: Self-pay

## 2022-04-06 ENCOUNTER — Encounter: Payer: Self-pay | Admitting: Internal Medicine

## 2022-04-06 DIAGNOSIS — I480 Paroxysmal atrial fibrillation: Secondary | ICD-10-CM

## 2022-04-06 LAB — CBC
Hematocrit: 36.8 % (ref 34.0–46.6)
Hemoglobin: 12.4 g/dL (ref 11.1–15.9)
MCH: 33.2 pg — ABNORMAL HIGH (ref 26.6–33.0)
MCHC: 33.7 g/dL (ref 31.5–35.7)
MCV: 99 fL — ABNORMAL HIGH (ref 79–97)
Platelets: 263 10*3/uL (ref 150–450)
RBC: 3.73 x10E6/uL — ABNORMAL LOW (ref 3.77–5.28)
RDW: 11.8 % (ref 11.7–15.4)
WBC: 4.9 10*3/uL (ref 3.4–10.8)

## 2022-04-06 LAB — BASIC METABOLIC PANEL
BUN/Creatinine Ratio: 18 (ref 12–28)
BUN: 16 mg/dL (ref 8–27)
CO2: 25 mmol/L (ref 20–29)
Calcium: 9.5 mg/dL (ref 8.7–10.3)
Chloride: 101 mmol/L (ref 96–106)
Creatinine, Ser: 0.9 mg/dL (ref 0.57–1.00)
Glucose: 104 mg/dL — ABNORMAL HIGH (ref 70–99)
Potassium: 4 mmol/L (ref 3.5–5.2)
Sodium: 139 mmol/L (ref 134–144)
eGFR: 71 mL/min/{1.73_m2} (ref >59)

## 2022-04-06 LAB — ECHOCARDIOGRAM COMPLETE
Area-P 1/2: 5.42 cm2
S' Lateral: 3.1 cm

## 2022-04-06 MED ORDER — TRAMADOL HCL 50 MG PO TABS
50.0000 mg | ORAL_TABLET | Freq: Four times a day (QID) | ORAL | 0 refills | Status: DC | PRN
  Filled 2022-04-06: qty 20, 5d supply, fill #0

## 2022-04-06 NOTE — Addendum Note (Signed)
Addended by: Rudean Haskell A on: 04/06/2022 01:10 PM   Modules accepted: Orders

## 2022-04-07 ENCOUNTER — Ambulatory Visit (HOSPITAL_COMMUNITY): Admission: RE | Admit: 2022-04-07 | Payer: Medicare HMO | Source: Ambulatory Visit | Admitting: Internal Medicine

## 2022-04-07 ENCOUNTER — Ambulatory Visit: Payer: Medicare HMO | Admitting: Family Medicine

## 2022-04-07 ENCOUNTER — Encounter (HOSPITAL_COMMUNITY): Admission: RE | Payer: Self-pay | Source: Ambulatory Visit

## 2022-04-07 ENCOUNTER — Encounter: Payer: Self-pay | Admitting: Family Medicine

## 2022-04-07 VITALS — BP 102/62 | Ht 64.5 in | Wt 140.0 lb

## 2022-04-07 DIAGNOSIS — M545 Low back pain, unspecified: Secondary | ICD-10-CM

## 2022-04-07 SURGERY — CARDIOVERSION
Anesthesia: General

## 2022-04-07 NOTE — Patient Instructions (Signed)
We will go ahead with an MRI of your lumbar spine and I will call you with the results.

## 2022-04-07 NOTE — Progress Notes (Signed)
PCP: Isaac Bliss, Rayford Halsted, MD  Subjective:   HPI: Patient is a 66 y.o. female here for left hip pain.  7/5: Patient reports she's had about 2 days of worsening left low back/posterior hip pain. No new injury or trauma. No radiation into legs. No numbness or tingling. No bowel/bladder dysfunction. Has tried tylenol, topical patches. Worse with movement.  7/7: Patient returns with continued pain left low back/posterior hip. Toradol injection did not help. Tramadol helps a little as did massage she had yesterday. No numbness or tingling. Discussed her low back pain at visit on 4/17, has had problems since that time but flared recently, more localized to left low back.  Past Medical History:  Diagnosis Date   Abdominal pain, left upper quadrant    Acute bronchospasm    Acute neck pain 07/02/2019   Acute sinusitis, unspecified 05/20/2009   Centricity Description: SINUSITIS - ACUTE-NOS Qualifier: Diagnosis of  By: Birdie Riddle MD, Belenda Cruise   Centricity Description: ACUTE SINUSITIS, UNSPECIFIED Qualifier: Diagnosis of  By: Alveta Heimlich MD, Eugene   Centricity Description: SINUSITIS, ACUTE Qualifier: Diagnosis of  By: Koleen Nimrod MD, Dellis Filbert     Anxiety    Atypical chest pain 03/05/2013   Body mass index (BMI) of 23.0-23.9 in adult    Cervicalgia    Colon polyp    Costochondral junction syndrome    Cough    DEPRESSION 08/25/2008   Qualifier: Diagnosis of  By: Ronnald Ramp CMA, Chemira     Diarrhea    Diverticulosis 07/27/2012   Dorsalgia    Dysrhythmia    hx a-fib   Elevated blood pressure reading without diagnosis of hypertension    Endometrial polyp 05/2007   benign   Essential hypertension 09/20/2020   Family history of osteoporosis 12/28/2011   Mother pt had normal Dexa 11/2009    Fatigue    GERD (gastroesophageal reflux disease)    HIP PAIN, RIGHT 04/12/2009   Qualifier: Diagnosis of  By: Oneida Alar MD, KARL     History of hiatal hernia    HYPERLIPIDEMIA 08/25/2008   Qualifier:  Diagnosis of  By: Birdie Riddle MD, Belenda Cruise     IBS (irritable bowel syndrome)    Left hip pain 07/30/2013   Left wrist pain 07/30/2013   Lumbar strain, initial encounter 07/28/2019   LUNG NODULE 11/19/2009   Neg CT     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 Please review for accuracy.   Otalgia, unspecified ear    Pain in throat    Palpitations    Paroxysmal atrial fibrillation (HCC)    Peroneal tendinitis of right lower extremity 03/12/2014   Plantar wart 04/02/2014   Pneumonia    RHINITIS 06/21/2009   Qualifier: Diagnosis of  By: Birdie Riddle MD, Belenda Cruise     Right ankle injury, subsequent encounter 01/11/2018   Right foot pain 08/09/2011   Sinusitis, chronic    Skene's gland abscess 2009   patient unaware   Sprain of unspecified ligament of right ankle, subsequent encounter  TRANSAMINASES, SERUM, ELEVATED 12/07/2009   Qualifier: Diagnosis of  By: Inda Castle FNP, Melissa S    Unspecified cataract    Urinary tract infection, site not specified    Vitamin D deficiency     Current Outpatient Medications on File Prior to Visit  Medication Sig Dispense Refill   apixaban (ELIQUIS) 5 MG TABS tablet Take 1 tablet (5 mg total) by mouth 2 (two) times daily. 60 tablet 11   azelastine (ASTELIN) 0.1 % nasal spray Place 1 spray into both nostrils daily as needed for allergies or rhinitis.     azelastine (ASTELIN) 0.1 % nasal spray Place 1-2 sprays into both nostrils 2 (two) times daily. 30 mL 3   baclofen (LIORESAL)  10 MG tablet Take 1 tablet (10 mg total) by mouth 3 (three) times daily as needed for muscle spasms. 60 each 1   Boswellia-Glucosamine-Vit D (OSTEO BI-FLEX ONE PER DAY PO) Take 1 capsule by mouth daily.     Calcium Carbonate-Vitamin D (CALTRATE 600+D PO) Take 1 tablet by mouth daily.     citalopram (CELEXA) 20 MG tablet Take 1.5 tablets (30 mg total) by mouth daily. (Patient taking differently: Take 20 mg by mouth daily.) 145 tablet 1   diltiazem (CARDIZEM CD) 120 MG 24 hr capsule Take as directed for 14 days 14 capsule 0   diltiazem (CARDIZEM) 30 MG tablet Take 1 tablet (30 mg total) by mouth 2 (two) times daily if needed for AFIB 45 tablet 1   famotidine (PEPCID) 10 MG tablet Take 1 tablet (10 mg total) by mouth 2 (two) times daily. (Patient taking differently: Take 10 mg by mouth daily as needed for indigestion or heartburn.) 90 tablet 0   flecainide (TAMBOCOR) 50 MG tablet Take 1 tablet (50 mg total) by mouth 2 (two) times daily. 180 tablet 3   fluticasone (FLONASE) 50 MCG/ACT nasal spray Place 1-2 sprays into both nostrils daily. (Patient taking differently: Place 1-2 sprays into both nostrils daily as needed for allergies or rhinitis.) 16 g 3   levocetirizine (XYZAL) 5 MG tablet Take 5 mg by mouth every evening.     methylPREDNISolone (MEDROL DOSEPAK) 4 MG TBPK tablet Take as directed 21 each 0   methylPREDNISolone (MEDROL DOSEPAK) 4 MG TBPK tablet Take as directed on package. 21 each 0   metoprolol succinate (TOPROL-XL) 25 MG 24 hr tablet Take 1 tablet (25 mg total) by mouth daily. (Patient taking differently: Take 25 mg by mouth 2 (two) times daily.) 90 tablet 1   Multiple Vitamins-Minerals (MULTI FOR HER 50+) TABS Take 1 tablet by mouth daily.     omeprazole (PRILOSEC) 20 MG capsule Take 1 capsule (20 mg total) by mouth in the morning 30 minutes before morning meal (Patient taking differently: Take 20 mg by mouth daily as needed (acid reflux).) 90 capsule 0   Probiotic Product (PROBIOTIC PO)  Take 1 capsule by mouth daily. Ultra Flora     traMADol (ULTRAM) 50 MG tablet Take 1 tablet (50 mg total) by mouth every 6 (six) hours as needed. 20 tablet 0   vitamin C (ASCORBIC ACID) 500 MG tablet Take 500 mg by mouth daily.     zinc gluconate 50 MG tablet Take 50 mg by mouth daily.     No current facility-administered medications on file prior to visit.    Past Surgical History:  Procedure Laterality Date   BROW LIFT Bilateral 12/29/2021   Procedure: BLEPHAROPLASTY;  Surgeon: Lennice Sites, MD;  Location: [facility];  Service:  Plastics;  Laterality: Bilateral;   COLONOSCOPY     x several   DILATION AND CURETTAGE OF UTERUS  05/2007   GYNECOLOGIC CRYOSURGERY  1980   HYSTEROSCOPY WITH D & C N/A 03/30/2017   Procedure: DILATATION & CURETTAGE/HYSTEROSCOPY WITH ULTRASOUND GUIDANCE;  Surgeon: Megan Salon, MD;  Location: [facility];  Service: Gynecology;  Laterality: N/A;   UPPER GI ENDOSCOPY     uterine ablation  2007    Allergies  Allergen Reactions   Augmentin [Amoxicillin-Pot Clavulanate] Diarrhea    BP 102/62   Ht 5' 4.5" (1.638 m)   Wt 140 lb (63.5 kg)   LMP 10/02/2006 (Approximate)   BMI 23.66 kg/m      08/30/2020    9:03 AM 06/08/2021    9:34 AM 08/22/2021   10:20 AM 11/09/2021   10:19 AM  [facility] Adult Exercise  Frequency of aerobic exercise (# of days/week) '7 7 7 6  '$ Average time in minutes 60 60 60 60  Frequency of strengthening activities (# of days/week) '3 3 2 3        '$ No data to display              Objective:  Physical Exam:  Gen: NAD, comfortable in exam room  Back: No gross deformity, scoliosis. TTP minimally left lumbar paraspinal region.  No midline or bony TTP. Limited flexion and extension secondary to pain. Strength LEs 5/5 all muscle groups.   Negative SLRs. Sensation intact to light touch bilaterally.   Assessment & Plan:  1. Chronic low back pain - continues to struggle despite home exercises and stretches, formal physical  therapy.  IM toradol did not help.  Using tylenol, topical patches.  Has baclofen to take if needed.  Will go ahead with MRI to further assess.

## 2022-04-12 ENCOUNTER — Encounter: Payer: Self-pay | Admitting: *Deleted

## 2022-04-12 ENCOUNTER — Ambulatory Visit: Payer: Medicare HMO | Admitting: Cardiology

## 2022-04-12 ENCOUNTER — Encounter: Payer: Self-pay | Admitting: Cardiology

## 2022-04-12 VITALS — BP 100/64 | HR 49 | Ht 64.5 in | Wt 142.0 lb

## 2022-04-12 DIAGNOSIS — Z01812 Encounter for preprocedural laboratory examination: Secondary | ICD-10-CM | POA: Diagnosis not present

## 2022-04-12 DIAGNOSIS — I480 Paroxysmal atrial fibrillation: Secondary | ICD-10-CM | POA: Diagnosis not present

## 2022-04-12 NOTE — Patient Instructions (Signed)
Medication Instructions:  Your physician recommends that you continue on your current medications as directed. Please refer to the Current Medication list given to you today.  *If you need a refill on your cardiac medications before your next appointment, please call your pharmacy*   Lab Work: Pre procedure labs -- see procedure instruction letter  If you have labs (blood work) drawn today and your tests are completely normal, you will receive your results only by: [place] (if you have MyChart) OR A paper copy in the mail If you have any lab test that is abnormal or we need to change your treatment, we will call you to review the results.   Testing/Procedures: Your physician has requested that you have cardiac CT within 7 days PRIOR to your ablation. Cardiac computed tomography (CT) is a painless test that uses an x-ray machine to take clear, detailed pictures of your heart.  Please follow instruction below located under "other instructions". You will get a call from our office to schedule the date for this test.  Your physician has recommended that you have an ablation. Catheter ablation is a medical procedure used to treat some cardiac arrhythmias (irregular heartbeats). During catheter ablation, a long, thin, flexible tube is put into a blood vessel in your groin (upper thigh), or neck. This tube is called an ablation catheter. It is then guided to your heart through the blood vessel. Radio frequency waves destroy small areas of heart tissue where abnormal heartbeats may cause an arrhythmia to start. Please follow instruction letter given to you today.   Follow-Up: At [facility], you and your health needs are our priority.  As part of our continuing mission to provide you with exceptional heart care, we have created designated Provider Care Teams.  These Care Teams include your primary Cardiologist (physician) and Advanced Practice Providers (APPs -  Physician Assistants and  Nurse Practitioners) who all work together to provide you with the care you need, when you need it.  Your next appointment:   1 month(s) after your ablation  The format for your next appointment:   In Person  Provider:   [facility] clinic   Thank you for choosing [facility]!!   Trinidad Curet, RN [phone]    Other Instructions  Cardiac Ablation Cardiac ablation is a procedure to destroy (ablate) some heart tissue that is sending bad signals. These bad signals cause problems in heart rhythm. The heart has many areas that make these signals. If there are problems in these areas, they can make the heart beat in a way that is not normal. Destroying some tissues can help make the heart rhythm normal. Tell your doctor about: Any allergies you have. All medicines you are taking. These include vitamins, herbs, eye drops, creams, and over-the-counter medicines. Any problems you or family members have had with medicines that make you fall asleep (anesthetics). Any blood disorders you have. Any surgeries you have had. Any medical conditions you have, such as kidney failure. Whether you are pregnant or may be pregnant. What are the risks? This is a safe procedure. But problems may occur, including: Infection. Bruising and bleeding. Bleeding into the chest. Stroke or blood clots. Damage to nearby areas of your body. Allergies to medicines or dyes. The need for a pacemaker if the normal system is damaged. Failure of the procedure to treat the problem. What happens before the procedure? Medicines Ask your doctor about: Changing or stopping your normal medicines. This is important. Taking aspirin and  ibuprofen. Do not take these medicines unless your doctor tells you to take them. Taking other medicines, vitamins, herbs, and supplements. General instructions Follow instructions from your doctor about what you cannot eat or drink. Plan to have someone take you home from the  hospital or clinic. If you will be going home right after the procedure, plan to have someone with you for 24 hours. Ask your doctor what steps will be taken to prevent infection. What happens during the procedure?  An IV tube will be put into one of your veins. You will be given a medicine to help you relax. The skin on your neck or groin will be numbed. A cut (incision) will be made in your neck or groin. A needle will be put through your cut and into a large vein. A tube (catheter) will be put into the needle. The tube will be moved to your heart. Dye may be put through the tube. This helps your doctor see your heart. Small devices (electrodes) on the tube will send out signals. A type of energy will be used to destroy some heart tissue. The tube will be taken out. Pressure will be held on your cut. This helps stop bleeding. A bandage will be put over your cut. The exact procedure may vary among doctors and hospitals. What happens after the procedure? You will be watched until you leave the hospital or clinic. This includes checking your heart rate, breathing rate, oxygen, and blood pressure. Your cut will be watched for bleeding. You will need to lie still for a few hours. Do not drive for 24 hours or as long as your doctor tells you. Summary Cardiac ablation is a procedure to destroy some heart tissue. This is done to treat heart rhythm problems. Tell your doctor about any medical conditions you may have. Tell him or her about all medicines you are taking to treat them. This is a safe procedure. But problems may occur. These include infection, bruising, bleeding, and damage to nearby areas of your body. Follow what your doctor tells you about food and drink. You may also be told to change or stop some of your medicines. After the procedure, do not drive for 24 hours or as long as your doctor tells you. This information is not intended to replace advice given to you by your health care  provider. Make sure you discuss any questions you have with your health care provider. Document Revised: 08/21/2019 Document Reviewed: 08/21/2019 Elsevier Patient Education  [employer].

## 2022-04-12 NOTE — Progress Notes (Signed)
Electrophysiology Office Note   Date:  04/12/2022   ID:  Kara Warren, DOB 1956-01-19, MRN [id number]  PCP:  Isaac Bliss, Rayford Halsted, MD  Cardiologist:  [facility] Primary Electrophysiologist:  Anamari Galeas Meredith Leeds, MD    Chief Complaint: AF   History of Present Illness: Kara Warren is a 66 y.o. female who is being seen today for the evaluation of AF at the request of Gasper Sells, Mahesh A*. Presenting today for electrophysiology evaluation.  She has a history significant for atypical chest pain, atrial fibrillation, hypertension.  She was diagnosed with atrial fibrillation 2 years ago.  She is currently on flecainide and diltiazem as well as Eliquis.  At the end of June, she had more frequent episodes of atrial fibrillation.  She felt weak, fatigued, with palpitations and shortness of breath.  She converted to sinus rhythm, but did go to the emergency room a few times.  Atrial fibrillation is confirmed via her Apple watch.  She is in normal rhythm today.  She was prescribed flecainide, but she has been taking it on a daily basis.  Today, she denies symptoms of palpitations, chest pain, shortness of breath, orthopnea, PND, lower extremity edema, claudication, dizziness, presyncope, syncope, bleeding, or neurologic sequela. The patient is tolerating medications without difficulties.    Past Medical History:  Diagnosis Date   Abdominal pain, left upper quadrant    Acute bronchospasm    Acute neck pain 07/02/2019   Acute sinusitis, unspecified 05/20/2009   Centricity Description: SINUSITIS - ACUTE-NOS Qualifier: Diagnosis of  By: Birdie Riddle MD, Belenda Cruise   Centricity Description: ACUTE SINUSITIS, UNSPECIFIED Qualifier: Diagnosis of  By: Alveta Heimlich MD, Eugene   Centricity Description: SINUSITIS, ACUTE Qualifier: Diagnosis of  By: Koleen Nimrod MD, Dellis Filbert     Anxiety    Atypical chest pain 03/05/2013   Body mass index (BMI) of 23.0-23.9 in adult    Cervicalgia    Colon polyp     Costochondral junction syndrome    Cough    DEPRESSION 08/25/2008   Qualifier: Diagnosis of  By: Ronnald Ramp CMA, Chemira     Diarrhea    Diverticulosis 07/27/2012   Dorsalgia    Dysrhythmia    hx a-fib   Elevated blood pressure reading without diagnosis of hypertension    Endometrial polyp 05/2007   benign   Essential hypertension 09/20/2020   Family history of osteoporosis 12/28/2011   Mother pt had normal Dexa 11/2009    Fatigue    GERD (gastroesophageal reflux disease)    HIP PAIN, RIGHT 04/12/2009   Qualifier: Diagnosis of  By: Oneida Alar MD, KARL     History of hiatal hernia    HYPERLIPIDEMIA 08/25/2008   Qualifier: Diagnosis of  By: Birdie Riddle MD, Belenda Cruise     IBS (irritable bowel syndrome)    Left hip pain 07/30/2013   Left wrist pain 07/30/2013   Lumbar strain, initial encounter 07/28/2019   LUNG NODULE 11/19/2009   Neg CT     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 Please review for accuracy.   Otalgia, unspecified ear    Pain in throat    Palpitations    Paroxysmal atrial fibrillation (HCC)    Peroneal tendinitis of right lower extremity 03/12/2014   Plantar wart 04/02/2014   Pneumonia    RHINITIS 06/21/2009   Qualifier: Diagnosis of  By: Birdie Riddle MD, Belenda Cruise     Right ankle injury, subsequent encounter 01/11/2018   Right foot pain 08/09/2011   Sinusitis, chronic    Skene's gland abscess 2009   patient unaware   Sprain of unspecified ligament of right ankle, subsequent encounter     TRANSAMINASES, SERUM, ELEVATED 12/07/2009   Qualifier: Diagnosis of  By: Inda Castle FNP, Melissa S    Unspecified cataract    Urinary tract infection, site not specified    Vitamin D deficiency    Past Surgical History:  Procedure Laterality Date   BROW LIFT Bilateral 12/29/2021   Procedure: BLEPHAROPLASTY;  Surgeon: Lennice Sites, MD;  Location: [facility];  Service: Plastics;  Laterality: Bilateral;   COLONOSCOPY     x several   DILATION AND CURETTAGE OF UTERUS  05/2007   GYNECOLOGIC CRYOSURGERY  1980   HYSTEROSCOPY WITH D & C N/A 03/30/2017   Procedure: DILATATION & CURETTAGE/HYSTEROSCOPY WITH ULTRASOUND GUIDANCE;  Surgeon: Megan Salon, MD;  Location: [facility];  Service: Gynecology;  Laterality: N/A;   UPPER GI ENDOSCOPY     uterine ablation  2007     Current Outpatient Medications  Medication Sig Dispense Refill   apixaban (ELIQUIS) 5 MG TABS tablet Take 1 tablet (5 mg total) by mouth 2 (two) times daily. 60 tablet 11   azelastine (ASTELIN) 0.1 % nasal spray Place 1-2 sprays into both nostrils 2 (two) times daily. 30 mL 3   baclofen (LIORESAL) 10 MG tablet Take 1 tablet (10 mg total) by mouth 3 (three) times daily as needed for muscle spasms. 60 each 1   Boswellia-Glucosamine-Vit D (OSTEO BI-FLEX ONE PER DAY PO) Take 1 capsule by mouth daily.     Calcium Carbonate-Vitamin D (CALTRATE 600+D PO) Take 1 tablet by mouth daily.     diltiazem (CARDIZEM) 30 MG tablet Take 1 tablet (30 mg total) by mouth 2 (two) times daily if needed for AFIB 45 tablet 1   famotidine (PEPCID) 10 MG tablet Take 1 tablet (10 mg total) by mouth 2 (two) times daily. (Patient taking differently: Take 10 mg by mouth daily as needed for indigestion or heartburn.) 90 tablet 0   flecainide (TAMBOCOR) 50 MG tablet Take 1 tablet (50 mg total) by mouth 2 (two) times daily. (Patient taking differently: Take 50 mg by mouth daily at 2 am.) 180 tablet 3   fluticasone (FLONASE) 50 MCG/ACT nasal spray Place 1-2 sprays into  both nostrils daily. (Patient taking differently: Place 1-2 sprays into both nostrils daily as needed for allergies or rhinitis.) 16 g 3   metoprolol succinate (TOPROL-XL) 25 MG 24 hr tablet Take 1 tablet (25 mg total) by mouth daily. 90 tablet 1   Multiple Vitamins-Minerals (MULTI FOR HER 50+) TABS Take 1 tablet by mouth daily.     omeprazole (PRILOSEC) 20 MG capsule Take 1 capsule (20 mg total) by mouth in the morning 30 minutes before morning meal (Patient taking differently: Take 20 mg by mouth daily as needed (acid reflux).) 90 capsule 0   Probiotic Product (PROBIOTIC PO) Take 1 capsule by mouth daily. Ultra Flora     vitamin C (ASCORBIC ACID) 500  MG tablet Take 500 mg by mouth daily.     zinc gluconate 50 MG tablet Take 50 mg by mouth daily.     citalopram (CELEXA) 20 MG tablet Take 1.5 tablets (30 mg total) by mouth daily. (Patient taking differently: Take 20 mg by mouth daily.) 145 tablet 1   diltiazem (CARDIZEM CD) 120 MG 24 hr capsule Take as directed for 14 days (Patient not taking: Reported on 04/12/2022) 14 capsule 0   levocetirizine (XYZAL) 5 MG tablet Take 5 mg by mouth every evening. (Patient not taking: Reported on 04/12/2022)     traMADol (ULTRAM) 50 MG tablet Take 1 tablet (50 mg total) by mouth every 6 (six) hours as needed. (Patient not taking: Reported on 04/12/2022) 20 tablet 0   No current facility-administered medications for this visit.    Allergies:   Augmentin [amoxicillin-pot clavulanate]   Social History:  The patient  reports that she quit smoking about 43 years ago. Her smoking use included cigarettes. She has a 0.30 pack-year smoking history. She has never used smokeless tobacco. She reports current alcohol use of about 7.0 standard drinks of alcohol per week. She reports that she does not use drugs.   Family History:  The patient's family history includes Breast cancer in her paternal grandmother; CAD in her father; Colitis in her father; Colon cancer in her maternal  grandfather; Colon polyps in her father and mother; Diverticulosis in her mother; Glaucoma in her father and mother; Hypertension in her father; Osteoporosis in her mother; Thyroid disease in her mother; Ulcerative colitis in her father.    ROS:  Please see the history of present illness.   Otherwise, review of systems is positive for none.   All other systems are reviewed and negative.    PHYSICAL EXAM: VS:  BP 100/64   Pulse (!) 49   Ht 5' 4.5" (1.638 m)   Wt 142 lb (64.4 kg)   LMP 10/02/2006 (Approximate)   SpO2 97%   BMI 24.00 kg/m  , BMI Body mass index is 24 kg/m. GEN: Well nourished, well developed, in no acute distress  HEENT: normal  Neck: no JVD, carotid bruits, or masses Cardiac: RRR; no murmurs, rubs, or gallops,no edema  Respiratory:  clear to auscultation bilaterally, normal work of breathing GI: soft, nontender, nondistended, + BS MS: no deformity or atrophy  Skin: warm and dry Neuro:  Strength and sensation are intact Psych: euthymic mood, full affect  EKG:  EKG is not ordered today. Personal review of the ekg ordered 04/05/22 shows sinus rhythm, rate 46  Recent Labs: 03/29/2022: ALT 46; Magnesium 2.1; TSH 0.539 04/05/2022: BUN 16; Creatinine, Ser 0.90; Hemoglobin 12.4; Platelets 263; Potassium 4.0; Sodium 139    Lipid Panel     Component Value Date/Time   CHOL 210 (H) 10/10/2021 0923   TRIG 64.0 10/10/2021 0923   HDL 87.70 10/10/2021 0923   CHOLHDL 2 10/10/2021 0923   VLDL 12.8 10/10/2021 0923   LDLCALC 109 (H) 10/10/2021 0923     Wt Readings from Last 3 Encounters:  04/12/22 142 lb (64.4 kg)  04/07/22 140 lb (63.5 kg)  04/05/22 140 lb (63.5 kg)      Other studies Reviewed: Additional studies/ records that were reviewed today include: TTE 04/06/22  Review of the above records today demonstrates:   1. Left ventricular ejection fraction, by estimation, is 60 to 65%. The  left ventricle has normal function. The left ventricle has no regional  wall  motion abnormalities. Left  ventricular diastolic parameters were  normal.   2. Right ventricular systolic function is normal. The right ventricular  size is normal. There is normal pulmonary artery systolic pressure.   3. Left atrial size was moderately to severely dilated.   4. Right atrial size was mildly dilated.   5. The mitral valve is normal in structure. Trivial mitral valve  regurgitation. No evidence of mitral stenosis.   6. The aortic valve is tricuspid. Aortic valve regurgitation is not  visualized. No aortic stenosis is present.   7. The inferior vena cava is normal in size with greater than 50%  respiratory variability, suggesting right atrial pressure of 3 mmHg.    ASSESSMENT AND PLAN:  1.  Paroxysmal atrial fibrillation: CHA2DS2-VASc of 1.  Currently on Toprol-XL 25 mg daily, diltiazem 120 mg daily, flecainide 50 mg twice daily.She is unfortunately continued to have episodes of atrial fibrillation.  She would prefer to stay in normal rhythm.  Due to that, we Oaklyn Mans plan for ablation.  Risk, benefits, and alternatives to EP study and radiofrequency ablation for afib were also discussed in detail today. These risks include but are not limited to stroke, bleeding, vascular damage, tamponade, perforation, damage to the esophagus, lungs, and other structures, pulmonary vein stenosis, worsening renal function, and death. The patient understands these risk and wishes to proceed.  We Rhys Lichty therefore proceed with catheter ablation at the next available time.  Carto, ICE, anesthesia are requested for the procedure.  Cheskel Silverio also obtain CT PV protocol prior to the procedure to exclude LAA thrombus and further evaluate atrial anatomy.   Case discussed with primary cardiology  Current medicines are reviewed at length with the patient today.   The patient does not have concerns regarding her medicines.  The following changes were made today:  none  Labs/ tests ordered today include:  Orders  Placed This Encounter  Procedures   CT CARDIAC MORPH/PULM VEIN W/CM&W/O CA SCORE   Basic metabolic panel   CBC     Disposition:   FU with Akhilesh Sassone 3 months  Signed, Taccara Bushnell Meredith Leeds, MD  04/12/2022 12:15 PM     [address] [address] [phone] (office) [phone] (fax)

## 2022-04-13 ENCOUNTER — Ambulatory Visit (HOSPITAL_BASED_OUTPATIENT_CLINIC_OR_DEPARTMENT_OTHER)
Admission: RE | Admit: 2022-04-13 | Discharge: 2022-04-13 | Disposition: A | Discharge status: Home / Self Care | Payer: Medicare HMO | Source: Ambulatory Visit | Attending: Family Medicine | Admitting: Family Medicine

## 2022-04-13 ENCOUNTER — Telehealth (HOSPITAL_COMMUNITY): Payer: Self-pay

## 2022-04-13 ENCOUNTER — Encounter: Payer: Self-pay | Admitting: Cardiology

## 2022-04-13 ENCOUNTER — Telehealth: Payer: Self-pay

## 2022-04-13 DIAGNOSIS — M545 Low back pain, unspecified: Secondary | ICD-10-CM | POA: Insufficient documentation

## 2022-04-13 DIAGNOSIS — I480 Paroxysmal atrial fibrillation: Secondary | ICD-10-CM

## 2022-04-13 DIAGNOSIS — M5116 Intervertebral disc disorders with radiculopathy, lumbar region: Secondary | ICD-10-CM | POA: Diagnosis not present

## 2022-04-13 NOTE — Telephone Encounter (Signed)
   Pre-operative Risk Assessment    Patient Name: Kara Warren  DOB: 1956-02-09 MRN: [id number]      Request for Surgical Clearance    Procedure:   Colonoscopy  Date of Surgery:  Clearance TBD                                 Surgeon:  Dr. Johnney Killian Group or Practice Name:  [facility], [place] Phone number:  [phone] Fax number:  [phone]   Type of Clearance Requested:   - Medical  - Pharmacy:  Hold Apixaban (Eliquis)     Type of Anesthesia:   Propofol   Additional requests/questions:   None  Signed, Prisha Hiley   04/13/2022, 10:54 AM

## 2022-04-13 NOTE — Telephone Encounter (Signed)
Patient called stating that she went into Afib last night. Wanted to know why the wait is so long for an ablation with [facility]. Wanted to know how does she go about getting a referral to another network Dr for an ablation. I informed her that she will need to call around and see who has openings and if the patient needs an appointment prior to ablation. In mean time if she does go into Afib and needs to be seen that we can schedule an appointment in the Afib clinic.

## 2022-04-14 ENCOUNTER — Other Ambulatory Visit: Payer: Self-pay | Admitting: Cardiovascular Disease

## 2022-04-14 ENCOUNTER — Other Ambulatory Visit (HOSPITAL_COMMUNITY): Payer: Self-pay

## 2022-04-14 ENCOUNTER — Other Ambulatory Visit: Payer: Medicare HMO

## 2022-04-14 MED ORDER — DILTIAZEM HCL 30 MG PO TABS
30.0000 mg | ORAL_TABLET | Freq: Two times a day (BID) | ORAL | 1 refills | Status: DC | PRN
  Filled 2022-04-14: qty 45, 23d supply, fill #0

## 2022-04-14 NOTE — Telephone Encounter (Signed)
*  STAT* If patient is at the pharmacy, call can be transferred to refill team.   1. Which medications need to be refilled? (please list name of each medication and dose if known) diltiazem (CARDIZEM) 30 MG tablet  2. Which pharmacy/location (including street and city if local pharmacy) is medication to be sent to? [facility]  3. Do they need a 30 day or 90 day supply? [place]

## 2022-04-14 NOTE — Telephone Encounter (Signed)
Dr. Curt Bears reviewed. Pt aware he is ok with referring her out if she wishes. She does not want to cancel what she has yet, but she does want to inquire if sooner ablaiton can be performed somewhere else. Will start w/ referral to [facility], pt agreeable.

## 2022-04-17 ENCOUNTER — Other Ambulatory Visit: Payer: Self-pay | Admitting: Internal Medicine

## 2022-04-17 ENCOUNTER — Encounter: Payer: Self-pay | Admitting: Cardiovascular Disease

## 2022-04-17 ENCOUNTER — Ambulatory Visit: Payer: Medicare HMO | Admitting: Internal Medicine

## 2022-04-17 ENCOUNTER — Other Ambulatory Visit (HOSPITAL_COMMUNITY): Payer: Self-pay

## 2022-04-17 MED ORDER — OMEPRAZOLE 20 MG PO CPDR
20.0000 mg | DELAYED_RELEASE_CAPSULE | Freq: Every morning | ORAL | 0 refills | Status: DC
  Filled 2022-04-17: qty 90, 90d supply, fill #0

## 2022-04-17 NOTE — Telephone Encounter (Signed)
Patient with diagnosis of afib on Eliquis for anticoagulation.    Procedure: colonoscopy Date of procedure: TBD  CHA2DS2-VASc Score = 3  This indicates a 3.2% annual risk of stroke. The patient's score is based upon: CHF History: 0 HTN History: 1 Diabetes History: 0 Stroke History: 0 Vascular Disease History: 0 Age Score: 1 Gender Score: 1   CrCl 37m/min Platelet count 263K  Pt had cardioversion canceled on 04/07/22, looks like she was also pending cardioversion for tomorrow that's been canceled as well. Now scheduled for ablation 08/01/22. Colonoscopy doesn't have date scheduled yet but pt will not be able to stop her anticoag for 3 weeks prior and 3 months post afib ablation on 08/01/22. As long as colonoscopy works around this timeframe, she can hold Eliquis for 1-2 days prior to procedure.    **This guidance is not considered finalized until pre-operative APP has relayed final recommendations.**

## 2022-04-17 NOTE — Telephone Encounter (Signed)
The patient needs close collaboration between colonoscopy and ablation. Agree with pharmacist recommendations.

## 2022-04-18 ENCOUNTER — Encounter (HOSPITAL_COMMUNITY): Payer: Self-pay

## 2022-04-18 ENCOUNTER — Ambulatory Visit (HOSPITAL_COMMUNITY): Admit: 2022-04-18 | Payer: Medicare HMO | Admitting: Cardiology

## 2022-04-18 SURGERY — CARDIOVERSION
Anesthesia: General

## 2022-04-19 ENCOUNTER — Other Ambulatory Visit: Payer: Self-pay | Admitting: Cardiovascular Disease

## 2022-04-19 ENCOUNTER — Other Ambulatory Visit: Payer: Self-pay | Admitting: Family Medicine

## 2022-04-19 ENCOUNTER — Other Ambulatory Visit: Payer: Self-pay

## 2022-04-19 ENCOUNTER — Ambulatory Visit (INDEPENDENT_AMBULATORY_CARE_PROVIDER_SITE_OTHER): Payer: Medicare HMO | Admitting: Licensed Clinical Social Worker

## 2022-04-19 ENCOUNTER — Encounter: Payer: Self-pay | Admitting: Internal Medicine

## 2022-04-19 ENCOUNTER — Ambulatory Visit (INDEPENDENT_AMBULATORY_CARE_PROVIDER_SITE_OTHER): Payer: Medicare HMO | Admitting: Internal Medicine

## 2022-04-19 ENCOUNTER — Telehealth: Payer: Self-pay | Admitting: Internal Medicine

## 2022-04-19 VITALS — BP 98/59 | HR 43 | Temp 98.8°F | Wt 146.4 lb

## 2022-04-19 DIAGNOSIS — F4321 Adjustment disorder with depressed mood: Secondary | ICD-10-CM

## 2022-04-19 DIAGNOSIS — I480 Paroxysmal atrial fibrillation: Secondary | ICD-10-CM

## 2022-04-19 DIAGNOSIS — F330 Major depressive disorder, recurrent, mild: Secondary | ICD-10-CM | POA: Diagnosis not present

## 2022-04-19 DIAGNOSIS — M545 Low back pain, unspecified: Secondary | ICD-10-CM

## 2022-04-19 DIAGNOSIS — R69 Illness, unspecified: Secondary | ICD-10-CM | POA: Diagnosis not present

## 2022-04-19 MED ORDER — METOPROLOL SUCCINATE ER 25 MG PO TB24: 12.5000 mg | ORAL_TABLET | Freq: Every day | ORAL | 1 refills | Status: DC

## 2022-04-19 NOTE — Telephone Encounter (Signed)
ERROR: NOTE  Flecainide 50 Mg tablet, taken by mouth twice daily       Original order NOT changed by DOD.     Flecainide that patient is taking in my prior note was entered incorrectly.    Please disregard the Flecainide listed at the top of my note;   Forde Dandy, RN 04/19/2022 at 340 pm

## 2022-04-19 NOTE — Telephone Encounter (Signed)
Pt called c/o HR low to mid 40's for the past 15 days.   Pt taking Flecainide 50 Mcg oral daily and Toprol-XL 25 Mg Daily  Dr. Curt Bears out of office, Pt concern taken to DOD, Dr. Acie Fredrickson.  Dr. Acie Fredrickson told pt asymptomatic, but follows HR daily.  Pt stated todays BP was 98/60's.... she was unsure of diastolic number.   After further review by Dr. Acie Fredrickson, New order to decrease Toprol- XL from 25 MG to 12.5 mg PO Daily.    Pt called back and sent MyChart MSG to DECREASE Toprol XL to 12.5 mg or HALF tablet by mouth daily.  Med change updated on Epic med list.     Copy of message will be sent to Dr. Curt Bears and Laurence Aly RN.

## 2022-04-19 NOTE — Progress Notes (Signed)
Established Patient Office Visit     CC/Reason for Visit: Follow-up chronic conditions  HPI: Kara Warren is a 66 y.o. female who is coming in today for the above mentioned reasons. Past Medical History is significant for: Atrial fibrillation, depression.  Lately she has been having issues with her A-fib.  She has had a couple of emergency department visits for the same.  She was started on flecainide, Toprol and Cardizem.  She is having low heart rates into the low 40s.  She is mostly asymptomatic with this.  She tells me that cardiology has scheduled her for an ablation, however this cannot happen until late October due to scheduling issues.  Who underwent is doing well.  She feels her mood is stable on 20 mg of Celexa.   Past Medical/Surgical History: Past Medical History:  Diagnosis Date   Abdominal pain, left upper quadrant    Acute bronchospasm    Acute neck pain 07/02/2019   Acute sinusitis, unspecified 05/20/2009   Centricity Description: SINUSITIS - ACUTE-NOS Qualifier: Diagnosis of  By: Birdie Riddle MD, Belenda Cruise   Centricity Description: ACUTE SINUSITIS, UNSPECIFIED Qualifier: Diagnosis of  By: Alveta Heimlich MD, Eugene   Centricity Description: SINUSITIS, ACUTE Qualifier: Diagnosis of  By: Koleen Nimrod MD, Dellis Filbert     Anxiety    Atypical chest pain 03/05/2013   Body mass index (BMI) of 23.0-23.9 in adult    Cervicalgia    Colon polyp    Costochondral junction syndrome    Cough    DEPRESSION 08/25/2008   Qualifier: Diagnosis of  By: Ronnald Ramp CMA, Chemira     Diarrhea    Diverticulosis 07/27/2012   Dorsalgia    Dysrhythmia    hx a-fib   Elevated blood pressure reading without diagnosis of hypertension    Endometrial polyp 05/2007   benign   Essential hypertension 09/20/2020   Family history of osteoporosis 12/28/2011   Mother pt had normal Dexa 11/2009    Fatigue    GERD (gastroesophageal reflux disease)    HIP PAIN, RIGHT 04/12/2009   Qualifier: Diagnosis of  By: Oneida Alar MD,  KARL     History of hiatal hernia    HYPERLIPIDEMIA 08/25/2008   Qualifier: Diagnosis of  By: Birdie Riddle MD, Belenda Cruise     IBS (irritable bowel syndrome)    Left hip pain 07/30/2013   Left wrist pain 07/30/2013   Lumbar strain, initial encounter 07/28/2019   LUNG NODULE 11/19/2009   Neg CT     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 Please review for accuracy.   Otalgia, unspecified ear    Pain in throat    Palpitations    Paroxysmal atrial fibrillation (HCC)    Peroneal tendinitis of right lower extremity 03/12/2014   Plantar wart 04/02/2014   Pneumonia    RHINITIS 06/21/2009   Qualifier: Diagnosis of  By: Birdie Riddle MD, Katherine     Right ankle injury, subsequent encounter 01/11/2018   Right foot pain 08/09/2011  Sinusitis, chronic    Skene's gland abscess 2009   patient unaware   Sprain of unspecified ligament of right ankle, subsequent encounter    TRANSAMINASES, SERUM, ELEVATED 12/07/2009   Qualifier: Diagnosis of  By: Inda Castle FNP, Melissa S    Unspecified cataract    Urinary tract infection, site not specified    Vitamin D deficiency     Past Surgical History:  Procedure Laterality Date   BROW LIFT Bilateral 12/29/2021   Procedure: BLEPHAROPLASTY;  Surgeon: Lennice Sites, MD;  Location: [facility];  Service: Plastics;  Laterality: Bilateral;   COLONOSCOPY     x several   DILATION AND CURETTAGE OF UTERUS  05/2007   GYNECOLOGIC CRYOSURGERY  1980   HYSTEROSCOPY WITH D & C N/A 03/30/2017    Procedure: DILATATION & CURETTAGE/HYSTEROSCOPY WITH ULTRASOUND GUIDANCE;  Surgeon: Megan Salon, MD;  Location: [facility];  Service: Gynecology;  Laterality: N/A;   UPPER GI ENDOSCOPY     uterine ablation  2007    Social History:  reports that she quit smoking about 43 years ago. Her smoking use included cigarettes. She has a 0.30 pack-year smoking history. She has never used smokeless tobacco. She reports current alcohol use of about 7.0 standard drinks of alcohol per week. She reports that she does not use drugs.  Allergies: Allergies  Allergen Reactions   Augmentin [Amoxicillin-Pot Clavulanate] Diarrhea    Family History:  Family History  Problem Relation Age of Onset   Colon polyps Father    Ulcerative colitis Father    CAD Father        Stent placement at age 67   Glaucoma Father    Hypertension Father    Colitis Father    Breast cancer Paternal Grandmother    Colon cancer Maternal Grandfather    Colon polyps Mother    Osteoporosis Mother    Thyroid disease Mother        hypo   Glaucoma Mother    Diverticulosis Mother    Sudden death Neg Hx    Hyperlipidemia Neg Hx    Heart attack Neg Hx    Diabetes Neg Hx      Current Outpatient Medications:    apixaban (ELIQUIS) 5 MG TABS tablet, Take 1 tablet (5 mg total) by mouth 2 (two) times daily., Disp: 60 tablet, Rfl: 11   azelastine (ASTELIN) 0.1 % nasal spray, Place 1-2 sprays into both nostrils 2 (two) times daily. (Patient taking differently: Place 1-2 sprays into both nostrils 2 (two) times daily as needed.), Disp: 30 mL, Rfl: 3   baclofen (LIORESAL) 10 MG tablet, Take 1 tablet (10 mg total) by mouth 3 (three) times daily as needed for muscle spasms., Disp: 60 each, Rfl: 1   Boswellia-Glucosamine-Vit D (OSTEO BI-FLEX ONE PER DAY PO), Take 1 capsule by mouth daily., Disp: , Rfl:    Calcium Carbonate-Vitamin D (CALTRATE 600+D PO), Take 1 tablet by mouth daily., Disp: , Rfl:    citalopram (CELEXA) 20 MG tablet, Take 1.5  tablets (30 mg total) by mouth daily. (Patient taking differently: Take 20 mg by mouth daily.), Disp: 145 tablet, Rfl: 1   diltiazem (CARDIZEM) 30 MG tablet, Take 1 tablet (30 mg total) by mouth 2 (two) times daily if needed for AFIB, Disp: 45 tablet, Rfl: 1   flecainide (TAMBOCOR) 50 MG tablet, Take 1 tablet (50 mg total) by mouth 2 (two) times daily. (Patient taking differently: Take 50 mg by mouth daily at 2 am.), Disp: 180 tablet, Rfl:  3   fluticasone (FLONASE) 50 MCG/ACT nasal spray, Place 1-2 sprays into both nostrils daily. (Patient taking differently: Place 1-2 sprays into both nostrils daily as needed for allergies or rhinitis.), Disp: 16 g, Rfl: 3   metoprolol succinate (TOPROL-XL) 25 MG 24 hr tablet, Take 1 tablet (25 mg total) by mouth daily., Disp: 90 tablet, Rfl: 1   Multiple Vitamins-Minerals (MULTI FOR HER 50+) TABS, Take 1 tablet by mouth daily., Disp: , Rfl:    omeprazole (PRILOSEC) 20 MG capsule, Take 1 capsule (20 mg total) by mouth in the morning 30 minutes before morning meal, Disp: 90 capsule, Rfl: 0   Probiotic Product (PROBIOTIC PO), Take 1 capsule by mouth daily. Ultra Flora, Disp: , Rfl:    traMADol (ULTRAM) 50 MG tablet, Take 1 tablet (50 mg total) by mouth every 6 (six) hours as needed., Disp: 20 tablet, Rfl: 0   vitamin C (ASCORBIC ACID) 500 MG tablet, Take 500 mg by mouth daily., Disp: , Rfl:    zinc gluconate 50 MG tablet, Take 50 mg by mouth daily., Disp: , Rfl:   Review of Systems:  Constitutional: Denies fever, chills, diaphoresis, appetite change and fatigue.  HEENT: Denies photophobia, eye pain, redness, hearing loss, ear pain, congestion, sore throat, rhinorrhea, sneezing, mouth sores, trouble swallowing, neck pain, neck stiffness and tinnitus.   Respiratory: Denies SOB, DOE, cough, chest tightness,  and wheezing.   Cardiovascular: Denies chest pain, palpitations and leg swelling.  Gastrointestinal: Denies nausea, vomiting, abdominal pain, diarrhea,  constipation, blood in stool and abdominal distention.  Genitourinary: Denies dysuria, urgency, frequency, hematuria, flank pain and difficulty urinating.  Endocrine: Denies: hot or cold intolerance, sweats, changes in hair or nails, polyuria, polydipsia. Musculoskeletal: Denies myalgias, back pain, joint swelling, arthralgias and gait problem.  Skin: Denies pallor, rash and wound.  Neurological: Denies dizziness, seizures, syncope, weakness, light-headedness, numbness and headaches.  Hematological: Denies adenopathy. Easy bruising, personal or family bleeding history  Psychiatric/Behavioral: Denies suicidal ideation, mood changes, confusion, nervousness, sleep disturbance and agitation    Physical Exam: Vitals:   04/19/22 1300 04/19/22 1306  BP: (!) 98/58 (!) 98/59  Pulse: (!) 44 (!) 43  Temp: 98.8 F (37.1 C)   TempSrc: Oral   SpO2: 98%   Weight: 146 lb 6.4 oz (66.4 kg)     Body mass index is 24.74 kg/m.   Constitutional: NAD, calm, comfortable Eyes: PERRL, lids and conjunctivae normal, wears corrective lenses ENMT: Mucous membranes are moist.  Respiratory: clear to auscultation bilaterally, no wheezing, no crackles. Normal respiratory effort. No accessory muscle use.  Cardiovascular: Irregular rhythm, bradycardic, no murmurs / rubs / gallops. No extremity edema.  Psychiatric: Normal judgment and insight. Alert and oriented x 3. Normal mood.    Impression and Plan:  Mild episode of recurrent major depressive disorder [facility]) [place] Visit from 04/19/2022 in [facility]  PHQ-9 Total Score 1      -Continue Celexa 20 mg.  PAF (paroxysmal atrial fibrillation) ([place]) -She will contact cardiology to discuss low heart rate.   Time spent:30 minutes reviewing chart, interviewing and examining patient and formulating plan of care.      Lelon Frohlich, MD [facility]

## 2022-04-19 NOTE — Telephone Encounter (Signed)
STAT if HR is under 50 or over 120 (normal HR is 60-100 beats per minute)  What is your heart rate? 46, 43; avg in 40's for past 15 days  Do you have a log of your heart rate readings (document readings)? Yes   Do you have any other symptoms? No

## 2022-04-20 ENCOUNTER — Other Ambulatory Visit (HOSPITAL_COMMUNITY): Payer: Self-pay

## 2022-04-20 ENCOUNTER — Telehealth: Payer: Self-pay | Admitting: *Deleted

## 2022-04-20 NOTE — Telephone Encounter (Signed)
   Pre-operative Risk Assessment    Patient Name: Kara Warren  DOB: 1956/01/21 MRN: [id number]      Request for Surgical Clearance    Procedure:   Lumbar Epidural  Date of Surgery:  Clearance TBD                                 Surgeon:  None Indicated  Surgeon's Group or Practice Name:  [facility] Phone number:  [phone] Fax number:  [phone]   Type of Clearance Requested:   - Pharmacy:  Hold Apixaban (Eliquis) X 2 days.   Type of Anesthesia:  Not Indicated   Additional requests/questions:    Signed, Greer Ee   04/20/2022, 3:25 PM

## 2022-04-20 NOTE — Addendum Note (Signed)
Addended by: Stanton Kidney on: 04/20/2022 01:51 PM   Modules accepted: Orders

## 2022-04-20 NOTE — Addendum Note (Signed)
Addended by: Stanton Kidney on: 04/20/2022 01:46 PM   Modules accepted: Orders

## 2022-04-21 ENCOUNTER — Encounter (HOSPITAL_COMMUNITY): Payer: Self-pay | Admitting: Licensed Clinical Social Worker

## 2022-04-21 NOTE — Telephone Encounter (Addendum)
Recently cleared on 7/13 for upcoming colonoscopy. Same general recs will apply to this procedure but with longer anticoag hold time since it's a spinal procedure.  Patient with diagnosis of afib on Eliquis for anticoagulation.     Procedure: lumbar epidural Date of procedure: TBD   CHA2DS2-VASc Score = 3  This indicates a 3.2% annual risk of stroke. The patient's score is based upon: CHF History: 0 HTN History: 1 Diabetes History: 0 Stroke History: 0 Vascular Disease History: 0 Age Score: 1 Gender Score: 1   CrCl 68m/min Platelet count 263K   Pt had cardioversion canceled on 04/07/22 and 04/18/22. Now scheduled for ablation 08/01/22. Epidural doesn't have date scheduled yet but pt will not be able to stop her anticoag for 3 weeks prior and 3 months post afib ablation on 08/01/22. As long as procedure works around this timeframe, she can hold Eliquis for 3 days prior to spinal procedure.     **This guidance is not considered finalized until pre-operative APP has relayed final recommendations.**

## 2022-04-21 NOTE — Telephone Encounter (Signed)
Previous guidance of avoiding holding anticoagulation 3 weeks prior and 3 months post ablation which is scheduled for 08/01/22.  Will route to Pharm D for official recommendation.

## 2022-04-21 NOTE — Progress Notes (Signed)
Virtual Visit via Video Note  I connected with Kara Warren on 04/21/22 at  9:00 AM EDT by a video enabled telemedicine application and verified that I am speaking with the correct person using two identifiers.  Location: Patient: home Provider: home office   I discussed the limitations of evaluation and management by telemedicine and the availability of in person appointments. The patient expressed understanding and agreed to proceed.    I discussed the assessment and treatment plan with the patient. The patient was provided an opportunity to ask questions and all were answered. The patient agreed with the plan and demonstrated an understanding of the instructions.   The patient was advised to call back or seek an in-person evaluation if the symptoms worsen or if the condition fails to improve as anticipated.  I provided 45 minutes of non-face-to-face time during this encounter.   Mindi Curling, LCSW   THERAPIST PROGRESS NOTE  Session Time: 9:00am-9:45am  Participation Level: Active  Behavioral Response: NeatAlertDepressed  Type of Therapy: Individual Therapy  Treatment Goals addressed:  Michaelle will identify patterns in relationships that are related to her level of self esteem and make choices more in line with higher levels of self esteem and self-compassion.    ProgressTowards Goals: Progressing  Interventions: CBT  Summary: Kara Warren is a 66 y.o. female who presents with Adjustment Disorder with depressed mood.   Suicidal/Homicidal: Nowithout intent/plan  Therapist Response: Shabre engaged well in individual virtual session with Pension scheme manager. Clinician utilized CBT to process thoughts, feelings, and behaviors. Clinician processed recent health issues and identified decision to communicate with ex-boyfriend in order to get support for those health issues. Clinician explored updates in that interaction and noted that she does not regret reaching out, but it  may have raised some old feelings again. Clinician discussed alternative friends and supports that may be helpful before reaching out to him again in the future. Noted some depressed mood due to concerns about health. Clinician discussed other coping skills and introduced the concept of EFT Tapping. Clinician sent videos on EFT tapping for anxiety, which as been increased since last session.   Plan: Return again in 3 weeks.  Diagnosis: Adjustment disorder with depressed mood  Collaboration of Care: Other none required in this session.  Patient/Guardian was advised Release of Information must be obtained prior to any record release in order to collaborate their care with an outside provider. Patient/Guardian was advised if they have not already done so to contact the registration department to sign all necessary forms in order for [place] to release information regarding their care.   Consent: Patient/Guardian gives verbal consent for treatment and assignment of benefits for services provided during this visit. Patient/Guardian expressed understanding and agreed to proceed.   Jobe Marker [place], LCSW 04/21/2022

## 2022-04-25 NOTE — Telephone Encounter (Signed)
   Patient Name: Kara Warren  DOB: 04/16/1956 MRN: [id number]  Primary Cardiologist: Lauree Chandler, MD  Clinical pharmacist have reviewed the patient's past medical history, labs, and current medications as part of pre-operative protocol coverage.  The following recommendations have been made:  Recently cleared on 7/13 for upcoming colonoscopy. Same general recs will apply to this procedure but with longer anticoag hold time since it's a spinal procedure.   Patient with diagnosis of afib on Eliquis for anticoagulation.     Procedure: lumbar epidural Date of procedure: TBD   CHA2DS2-VASc Score = 3  This indicates a 3.2% annual risk of stroke. The patient's score is based upon: CHF History: 0 HTN History: 1 Diabetes History: 0 Stroke History: 0 Vascular Disease History: 0 Age Score: 1 Gender Score: 1   CrCl 84m/min Platelet count 263K   Pt had cardioversion canceled on 04/07/22 and 04/18/22. Now scheduled for ablation 08/01/22. Epidural doesn't have date scheduled yet but pt will not be able to stop her anticoag for 3 weeks prior and 3 months post afib ablation on 08/01/22. As long as procedure works around this timeframe, she can hold Eliquis for 3 days prior to spinal procedure.    I will route this recommendation to the requesting party via Epic fax function and remove from pre-op pool.  Please call with questions.  ELenna Sciara NP 04/25/2022, 12:13 PM

## 2022-04-27 ENCOUNTER — Other Ambulatory Visit: Payer: Medicare HMO

## 2022-04-27 NOTE — Telephone Encounter (Signed)
Followed up w/ pt who reports she is doing better, HRs avg 50-60s compared to last week. She is feeling much improved and she is not fatigued like she was. She appreciates my follow up on this.  She has not  heard from [facility] yet.  Aware to let me know by middle of next week if she hasn't heard anything then. Patient verbalized understanding and agreeable to plan.

## 2022-05-02 ENCOUNTER — Ambulatory Visit
Admission: RE | Admit: 2022-05-02 | Discharge: 2022-05-02 | Disposition: A | Discharge status: Home / Self Care | Payer: Medicare HMO | Source: Ambulatory Visit | Attending: Family Medicine | Admitting: Family Medicine

## 2022-05-02 DIAGNOSIS — M545 Low back pain, unspecified: Secondary | ICD-10-CM

## 2022-05-02 DIAGNOSIS — M5126 Other intervertebral disc displacement, lumbar region: Secondary | ICD-10-CM | POA: Diagnosis not present

## 2022-05-02 DIAGNOSIS — M47817 Spondylosis without myelopathy or radiculopathy, lumbosacral region: Secondary | ICD-10-CM | POA: Diagnosis not present

## 2022-05-02 MED ORDER — METHYLPREDNISOLONE ACETATE 40 MG/ML INJ SUSP (RADIOLOG
80.0000 mg | Freq: Once | INTRAMUSCULAR | Status: AC
  Administered 2022-05-02: 80 mg via EPIDURAL

## 2022-05-02 MED ORDER — IOPAMIDOL (ISOVUE-M 200) INJECTION 41%
1.0000 mL | Freq: Once | INTRAMUSCULAR | Status: AC
  Administered 2022-05-02: 1 mL via EPIDURAL

## 2022-05-02 NOTE — Discharge Instructions (Signed)
Post Procedure Spinal Discharge Instruction Sheet  You may resume a regular diet and any medications that you routinely take (including pain medications) unless otherwise noted by MD.  No driving day of procedure.  Light activity throughout the rest of the day.  Do not do any strenuous work, exercise, bending or lifting.  The day following the procedure, you can resume normal physical activity but you should refrain from exercising or physical therapy for at least three days thereafter.  You may apply ice to the injection site, 20 minutes on, 20 minutes off, as needed. Do not apply ice directly to skin.    Common Side Effects:  Headaches- take your usual medications as directed by your physician.  Increase your fluid intake.  Caffeinated beverages may be helpful.  Lie flat in bed until your headache resolves.  Restlessness or inability to sleep- you may have trouble sleeping for the next few days.  Ask your referring physician if you need any medication for sleep.  Facial flushing or redness- should subside within a few days.  Increased pain- a temporary increase in pain a day or two following your procedure is not unusual.  Take your pain medication as prescribed by your referring physician.  Leg cramps  Please contact our office at [phone] for the following symptoms: Fever greater than 100 degrees. Headaches unresolved with medication after 2-3 days. Increased swelling, pain, or redness at injection site.   Thank you for visiting [facility] today.    YOU MAY RESUME YOUR ELIQUIS 24 HOURS POST PROCEDURE

## 2022-05-11 ENCOUNTER — Other Ambulatory Visit (HOSPITAL_BASED_OUTPATIENT_CLINIC_OR_DEPARTMENT_OTHER): Payer: Self-pay | Admitting: Internal Medicine

## 2022-05-11 DIAGNOSIS — Z1231 Encounter for screening mammogram for malignant neoplasm of breast: Secondary | ICD-10-CM

## 2022-05-15 ENCOUNTER — Encounter: Payer: Self-pay | Admitting: Cardiology

## 2022-05-15 ENCOUNTER — Encounter: Payer: Self-pay | Admitting: Family Medicine

## 2022-05-15 ENCOUNTER — Ambulatory Visit: Payer: Medicare HMO | Admitting: Family Medicine

## 2022-05-15 VITALS — BP 95/43 | Ht 64.5 in

## 2022-05-15 DIAGNOSIS — M5416 Radiculopathy, lumbar region: Secondary | ICD-10-CM | POA: Diagnosis not present

## 2022-05-15 NOTE — Telephone Encounter (Signed)
Staff contacting patient about location for PT given her recent note about delay in initial appointment.

## 2022-05-15 NOTE — Progress Notes (Signed)
PCP: Isaac Bliss, Rayford Halsted, MD  Subjective:   HPI: Patient is a 65 y.o. female here for low back pain.  Continuing to have left sided low back pain without radicular symptoms. MRI showed L4-5 disc bulge compressing the L5 nerve. She got an epidural injection 2 weeks ago which helped slightly. She has not been doing home exercises. Takes Tylenol as needed, but prefers to not take medications. No bowel/bladder dysfunction.  No numbness, tingling.  Past Medical History:  Diagnosis Date   Abdominal pain, left upper quadrant    Acute bronchospasm    Acute neck pain 07/02/2019   Acute sinusitis, unspecified 05/20/2009   Centricity Description: SINUSITIS - ACUTE-NOS Qualifier: Diagnosis of  By: Birdie Riddle MD, Belenda Cruise   Centricity Description: ACUTE SINUSITIS, UNSPECIFIED Qualifier: Diagnosis of  By: Alveta Heimlich MD, Eugene   Centricity Description: SINUSITIS, ACUTE Qualifier: Diagnosis of  By: Koleen Nimrod MD, Dellis Filbert     Anxiety    Atypical chest pain 03/05/2013   Body mass index (BMI) of 23.0-23.9 in adult    Cervicalgia    Colon polyp    Costochondral junction syndrome    Cough    DEPRESSION 08/25/2008   Qualifier: Diagnosis of  By: Ronnald Ramp CMA, Chemira     Diarrhea    Diverticulosis 07/27/2012   Dorsalgia    Dysrhythmia    hx a-fib   Elevated blood pressure reading without diagnosis of hypertension    Endometrial polyp 05/2007   benign   Essential hypertension 09/20/2020   Family history of osteoporosis 12/28/2011   Mother pt had normal Dexa 11/2009    Fatigue    GERD (gastroesophageal reflux disease)    HIP PAIN, RIGHT 04/12/2009   Qualifier: Diagnosis of  By: Oneida Alar MD, KARL     History of hiatal hernia    HYPERLIPIDEMIA 08/25/2008   Qualifier: Diagnosis of  By: Birdie Riddle MD, Belenda Cruise     IBS (irritable bowel syndrome)    Left hip pain 07/30/2013   Left wrist pain 07/30/2013   Lumbar strain, initial encounter 07/28/2019   LUNG NODULE 11/19/2009   Neg CT     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 Please review for accuracy.   Otalgia, unspecified ear    Pain in throat    Palpitations    Paroxysmal atrial fibrillation (HCC)    Peroneal tendinitis of right lower extremity 03/12/2014   Plantar wart 04/02/2014   Pneumonia    RHINITIS 06/21/2009   Qualifier: Diagnosis of  By: Birdie Riddle MD, Belenda Cruise     Right ankle injury, subsequent encounter 01/11/2018   Right foot pain 08/09/2011   Sinusitis, chronic    Skene's gland abscess 2009   patient unaware   Sprain of unspecified ligament of right ankle, subsequent encounter    TRANSAMINASES, SERUM, ELEVATED 12/07/2009   Qualifier: Diagnosis of  By: Inda Castle FNP, Melissa S    Unspecified cataract    Urinary tract infection, site not specified    Vitamin D deficiency  Current Outpatient Medications on File Prior to Visit  Medication Sig Dispense Refill   apixaban (ELIQUIS) 5 MG TABS tablet Take 1 tablet (5 mg total) by mouth 2 (two) times daily. 60 tablet 11   azelastine (ASTELIN) 0.1 % nasal spray Place 1-2 sprays into both nostrils 2 (two) times daily. (Patient taking differently: Place 1-2 sprays into both nostrils 2 (two) times daily as needed.) 30 mL 3   baclofen (LIORESAL) 10 MG tablet Take 1 tablet (10 mg total) by mouth 3 (three) times daily as needed for muscle spasms. 60 each 1   Boswellia-Glucosamine-Vit D (OSTEO BI-FLEX ONE PER DAY PO) Take 1 capsule by mouth daily.     Calcium Carbonate-Vitamin D (CALTRATE 600+D PO) Take 1  tablet by mouth daily.     citalopram (CELEXA) 20 MG tablet Take 1.5 tablets (30 mg total) by mouth daily. (Patient taking differently: Take 20 mg by mouth daily.) 145 tablet 1   diltiazem (CARDIZEM) 30 MG tablet Take 1 tablet (30 mg total) by mouth 2 (two) times daily if needed for AFIB 45 tablet 1   flecainide (TAMBOCOR) 50 MG tablet Take 1 tablet (50 mg total) by mouth 2 (two) times daily. (Patient taking differently: Take 50 mg by mouth daily at 2 am.) 180 tablet 3   fluticasone (FLONASE) 50 MCG/ACT nasal spray Place 1-2 sprays into both nostrils daily. (Patient taking differently: Place 1-2 sprays into both nostrils daily as needed for allergies or rhinitis.) 16 g 3   metoprolol succinate (TOPROL-XL) 25 MG 24 hr tablet Take 0.5 tablets (12.5 mg total) by mouth daily. 90 tablet 1   Multiple Vitamins-Minerals (MULTI FOR HER 50+) TABS Take 1 tablet by mouth daily.     omeprazole (PRILOSEC) 20 MG capsule Take 1 capsule (20 mg total) by mouth in the morning 30 minutes before morning meal 90 capsule 0   Probiotic Product (PROBIOTIC PO) Take 1 capsule by mouth daily. Ultra Flora     traMADol (ULTRAM) 50 MG tablet Take 1 tablet (50 mg total) by mouth every 6 (six) hours as needed. 20 tablet 0   vitamin C (ASCORBIC ACID) 500 MG tablet Take 500 mg by mouth daily.     zinc gluconate 50 MG tablet Take 50 mg by mouth daily.     No current facility-administered medications on file prior to visit.    Past Surgical History:  Procedure Laterality Date   BROW LIFT Bilateral 12/29/2021   Procedure: BLEPHAROPLASTY;  Surgeon: Lennice Sites, MD;  Location: [facility];  Service: Plastics;  Laterality: Bilateral;   COLONOSCOPY     x several   DILATION AND CURETTAGE OF UTERUS  05/2007   GYNECOLOGIC CRYOSURGERY  1980   HYSTEROSCOPY WITH D & C N/A 03/30/2017   Procedure: DILATATION & CURETTAGE/HYSTEROSCOPY WITH ULTRASOUND GUIDANCE;  Surgeon: Megan Salon, MD;  Location: [facility];  Service: Gynecology;  Laterality:  N/A;   UPPER GI ENDOSCOPY     uterine ablation  2007    Allergies  Allergen Reactions   Augmentin [Amoxicillin-Pot Clavulanate] Diarrhea    BP (!) 95/43   Ht 5' 4.5" (1.638 m)   LMP 10/02/2006 (Approximate)   BMI 24.74 kg/m      08/30/2020    9:03 AM 06/08/2021    9:34 AM 08/22/2021   10:20 AM 11/09/2021   10:19 AM  [facility] Adult [facility]  Frequency of aerobic exercise (# of days/week) '7 7 7 6  '$ Average time in minutes 60 60 60  60  Frequency of strengthening activities (# of days/week) '3 3 2 3        '$ No data to display              Objective:  Physical Exam:  Gen: NAD, comfortable in exam room Lumbar spine:  - Inspection: no gross deformity or scoliosis; no swelling or ecchymosis. No skin changes - Palpation: No TTP over the spinous processes or SI joints b/l. Mild TTP over paraspinal muscles R>L - ROM: full active ROM of the lumbar spine in flexion and extension without pain - Strength: 5/5 strength of lower extremity in L4-S1 nerve root distributions b/l - Neuro: sensation intact in the L4-S1 nerve root distribution b/l - Provocative Testing: Negative straight leg raise, Modified Slump Test, Negative FABER/FADIR bilaterally   Assessment & Plan:  1. Low back pain without radicular symptoms likely from from L4-5 bulging disc found on MRI. Recommend physical therapy and home exercises. Consider another injection if pain is not improving or is worsening. Follow up in 4-6 weeks.   Virgel Manifold, MS4

## 2022-05-15 NOTE — Patient Instructions (Signed)
Start physical therapy and do home exercises on days you don't go to therapy. Call me or mychart message me if you're struggling and we need to go ahead with another injection. Follow up with me in 4-6 weeks otherwise.

## 2022-05-16 ENCOUNTER — Encounter (HOSPITAL_COMMUNITY): Payer: Self-pay | Admitting: Licensed Clinical Social Worker

## 2022-05-16 ENCOUNTER — Ambulatory Visit (INDEPENDENT_AMBULATORY_CARE_PROVIDER_SITE_OTHER): Payer: Medicare HMO | Admitting: Licensed Clinical Social Worker

## 2022-05-16 DIAGNOSIS — F4321 Adjustment disorder with depressed mood: Secondary | ICD-10-CM

## 2022-05-16 DIAGNOSIS — R69 Illness, unspecified: Secondary | ICD-10-CM | POA: Diagnosis not present

## 2022-05-16 NOTE — Progress Notes (Signed)
   THERAPIST PROGRESS NOTE  Session Time: 11:00am-12:00pm  Participation Level: Active  Behavioral Response: Well GroomedAlertAnxious and Euthymic  Type of Therapy: Individual Therapy  Treatment Goals addressed: Tameika will identify patterns in relationships that are related to her level of self esteem and make choices more in line with higher levels of self esteem and self-compassion.    ProgressTowards Goals: Progressing  Interventions: Motivational Interviewing  Summary: Kara Warren is a 66 y.o. female who presents with Adjustment Disorder with depressed mood.   Suicidal/Homicidal: Nowithout intent/plan  Therapist Response: Kara Warren engaged well in individual in person session with clinician. Clinician utilized MI OARS to reflect and summarize thoughts and feelings about life decisions. Kara Warren shared that she and her ex-partner have been seeing each other again. Clinician processed the decision to return to this relationship. Clinician explored the differences this time, as compared to the other times they have attempted to make a relationship work. Kara Warren shared changes in her own confidence levels, willingness to speak up when she is unhappy, and her level of independence at this time. Clinician discussed feedback from friends and family members, some who have been supportive and others who have objected. Clinician explored the impact of the concerns from her friends, noting that everyone has opinions. Kara Warren reported that she has been able to maintain boundaries and not allow the negativity into her heart. She shared that she understands how they feel and she is going to do what she wants now.  Clinician discussed the importance of communication, checking in with Tom about the relationship, his and her comfort in the situation, and to check in with herself about her feelings.   Plan: Return again in 2 weeks.  Diagnosis: Adjustment disorder with depressed mood  Collaboration of Care:  Other none required in this session  Patient/Guardian was advised Release of Information must be obtained prior to any record release in order to collaborate their care with an outside provider. Patient/Guardian was advised if they have not already done so to contact the registration department to sign all necessary forms in order for [place] to release information regarding their care.   Consent: Patient/Guardian gives verbal consent for treatment and assignment of benefits for services provided during this visit. Patient/Guardian expressed understanding and agreed to proceed.   Jobe Marker Nittany, LCSW 05/16/2022

## 2022-05-17 ENCOUNTER — Other Ambulatory Visit (HOSPITAL_COMMUNITY): Payer: Self-pay

## 2022-05-17 DIAGNOSIS — M545 Low back pain, unspecified: Secondary | ICD-10-CM | POA: Diagnosis not present

## 2022-05-18 ENCOUNTER — Ambulatory Visit: Payer: Medicare HMO | Admitting: Physical Therapy

## 2022-05-22 ENCOUNTER — Ambulatory Visit: Payer: Medicare HMO

## 2022-05-22 DIAGNOSIS — M545 Low back pain, unspecified: Secondary | ICD-10-CM | POA: Diagnosis not present

## 2022-05-26 ENCOUNTER — Ambulatory Visit (HOSPITAL_BASED_OUTPATIENT_CLINIC_OR_DEPARTMENT_OTHER)
Admission: RE | Admit: 2022-05-26 | Discharge: 2022-05-26 | Disposition: A | Discharge status: Home / Self Care | Payer: Medicare HMO | Source: Ambulatory Visit | Attending: Internal Medicine | Admitting: Internal Medicine

## 2022-05-26 DIAGNOSIS — Z1231 Encounter for screening mammogram for malignant neoplasm of breast: Secondary | ICD-10-CM | POA: Insufficient documentation

## 2022-05-30 ENCOUNTER — Ambulatory Visit (INDEPENDENT_AMBULATORY_CARE_PROVIDER_SITE_OTHER): Payer: Medicare HMO | Admitting: Licensed Clinical Social Worker

## 2022-05-30 DIAGNOSIS — F4321 Adjustment disorder with depressed mood: Secondary | ICD-10-CM | POA: Diagnosis not present

## 2022-05-30 DIAGNOSIS — R69 Illness, unspecified: Secondary | ICD-10-CM | POA: Diagnosis not present

## 2022-05-31 ENCOUNTER — Ambulatory Visit (HOSPITAL_COMMUNITY): Payer: Medicare HMO | Admitting: Licensed Clinical Social Worker

## 2022-06-01 ENCOUNTER — Encounter (HOSPITAL_COMMUNITY): Payer: Self-pay | Admitting: Licensed Clinical Social Worker

## 2022-06-01 NOTE — Progress Notes (Signed)
   THERAPIST PROGRESS NOTE  Session Time: 11:00am-11:55am  Participation Level: Active  Behavioral Response: Well GroomedAlertEuthymic  Type of Therapy: Individual Therapy  Treatment Goals addressed: Kara Warren will identify patterns in relationships that are related to her level of self esteem and make choices more in line with higher levels of self esteem and self-compassion.    ProgressTowards Goals: Progressing  Interventions: CBT  Summary: Kara Warren is a 66 y.o. female who presents with Adjustment Disorder with depressed mood.   Suicidal/Homicidal: Nowithout intent/plan  Therapist Response: Jaedin engaged well in individual in person session. Clinician utilized CBT to process recent updates in relationship with partner, recent interactions with friends, and her overall self esteem levels. Clinician identified that the relationship has been difference, particularly her willingness to stand up for herself, establish more effective boundaries regarding her time and activities, and to reflect on the past. Clinician noted increased smiling, and also a brighter tone to her voice, particularly when discussing her increase in self esteem. Clinician explored updates with friend and identified ways her newly found self-esteem has helped her with creating boundaries in this relationship as well. Clinician encouraged Kara Warren to reflect back on her words and decisions, to see how they impact her mood and overall sense of comfort in her skin.   Plan: Return again in 2 weeks.  Diagnosis: Adjustment disorder with depressed mood  Collaboration of Care: Other none required in this session.   Patient/Guardian was advised Release of Information must be obtained prior to any record release in order to collaborate their care with an outside provider. Patient/Guardian was advised if they have not already done so to contact the registration department to sign all necessary forms in order for [place] to release  information regarding their care.   Consent: Patient/Guardian gives verbal consent for treatment and assignment of benefits for services provided during this visit. Patient/Guardian expressed understanding and agreed to proceed.   Jobe Marker Lealman, LCSW 06/01/2022

## 2022-06-02 DIAGNOSIS — M545 Low back pain, unspecified: Secondary | ICD-10-CM | POA: Diagnosis not present

## 2022-06-05 DIAGNOSIS — M545 Low back pain, unspecified: Secondary | ICD-10-CM | POA: Diagnosis not present

## 2022-06-08 DIAGNOSIS — M545 Low back pain, unspecified: Secondary | ICD-10-CM | POA: Diagnosis not present

## 2022-06-10 IMAGING — MG MM DIGITAL DIAGNOSTIC UNILAT*R* W/ TOMO W/ CAD
4 series · 4 of 12 positions shown · non-contrast
Comparison: Previous exam(s).

CLINICAL DATA: Screening recall for a right breast asymmetry.

EXAM:
DIGITAL DIAGNOSTIC UNILATERAL RIGHT MAMMOGRAM WITH TOMOSYNTHESIS AND
CAD
TECHNIQUE: Right digital diagnostic mammography and breast tomosynthesis was
performed. The images were evaluated with computer-aided detection.

[R ML synth-2D]
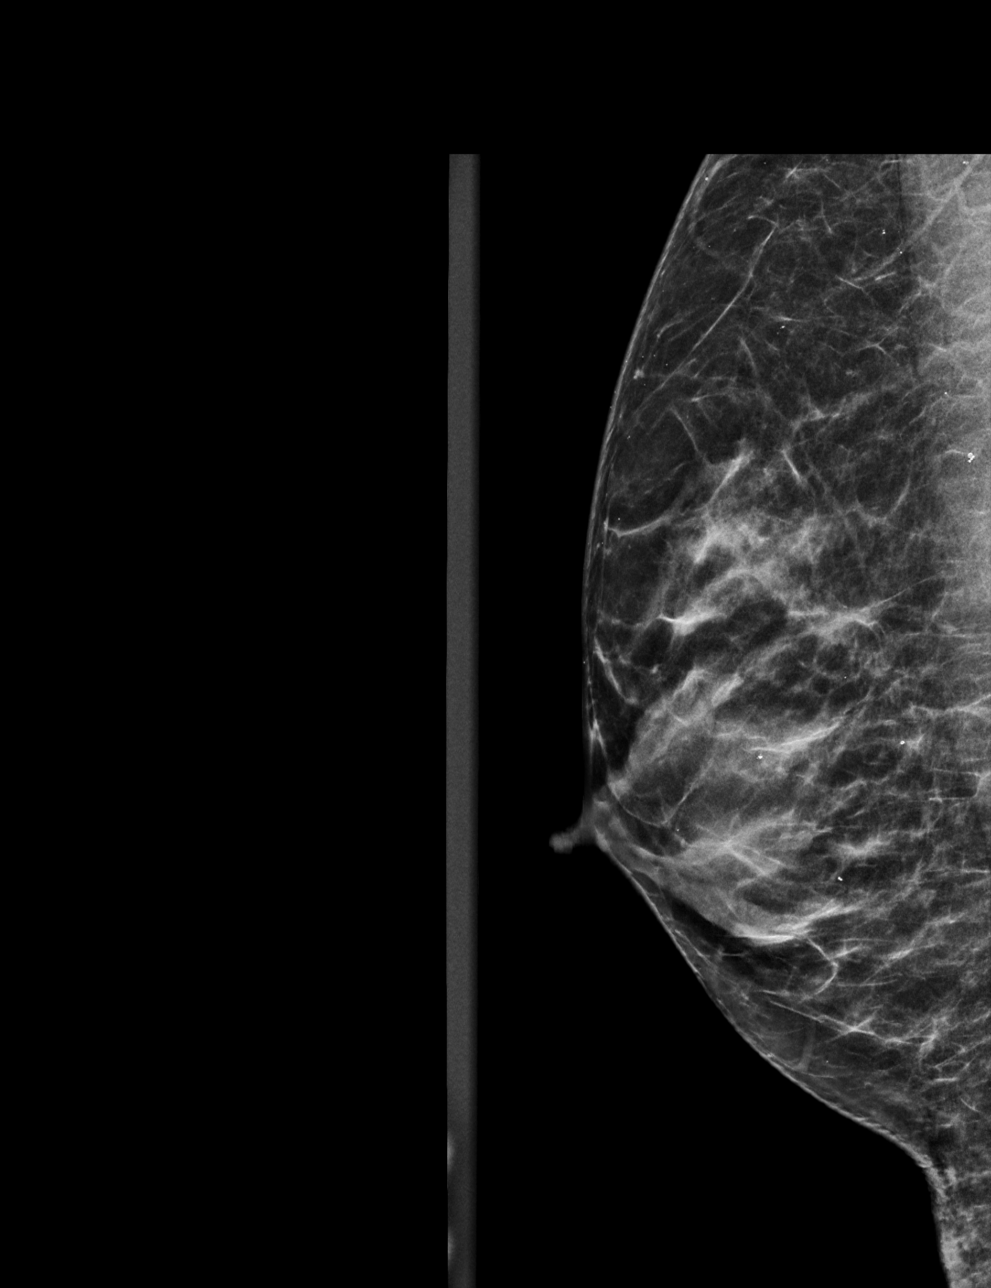

[R CC synth-2D]
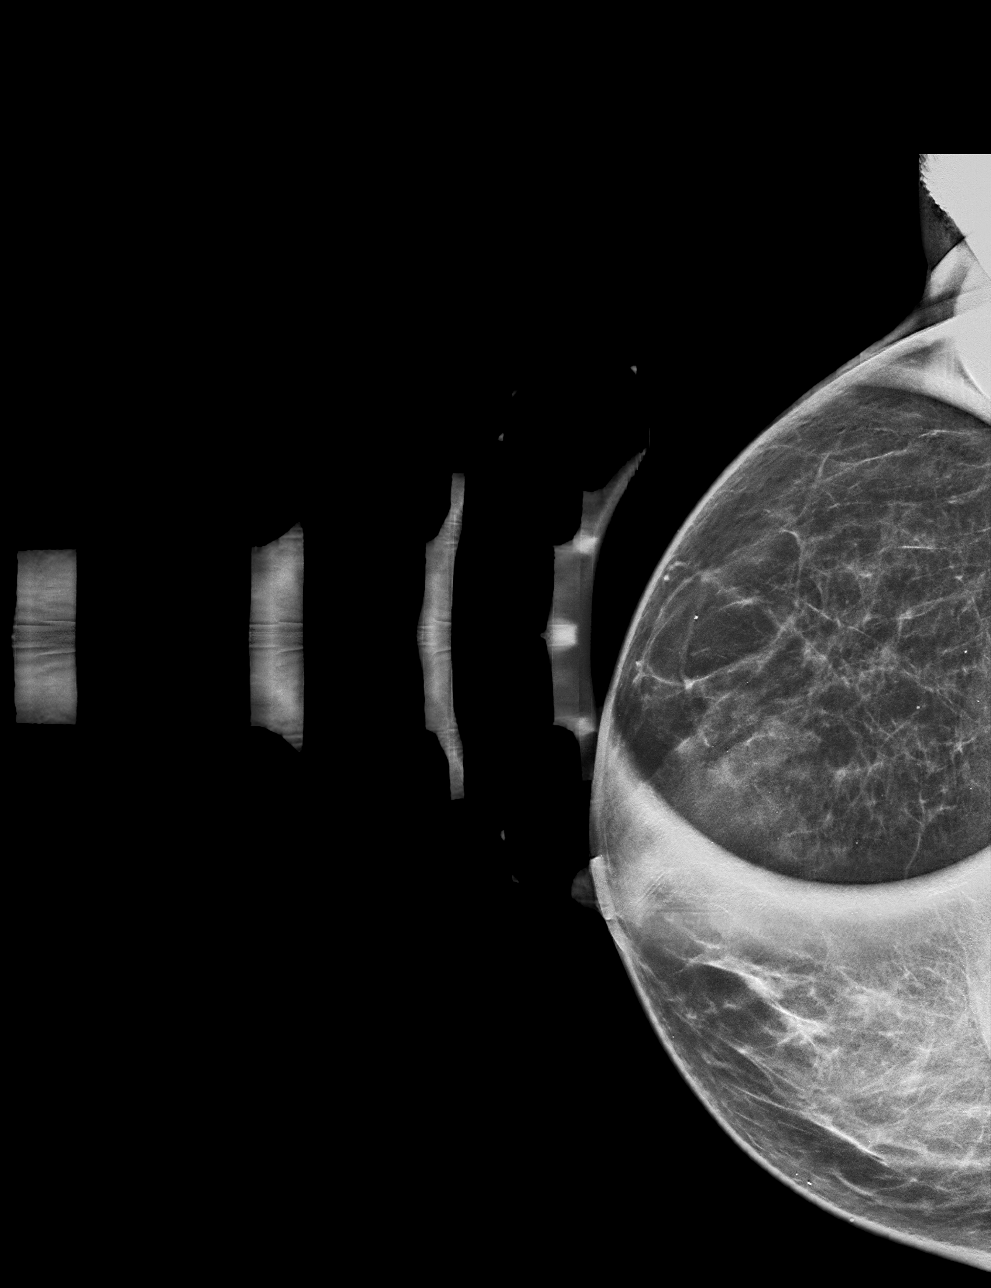

[R CC tomo · tomo slice 27/53.0]
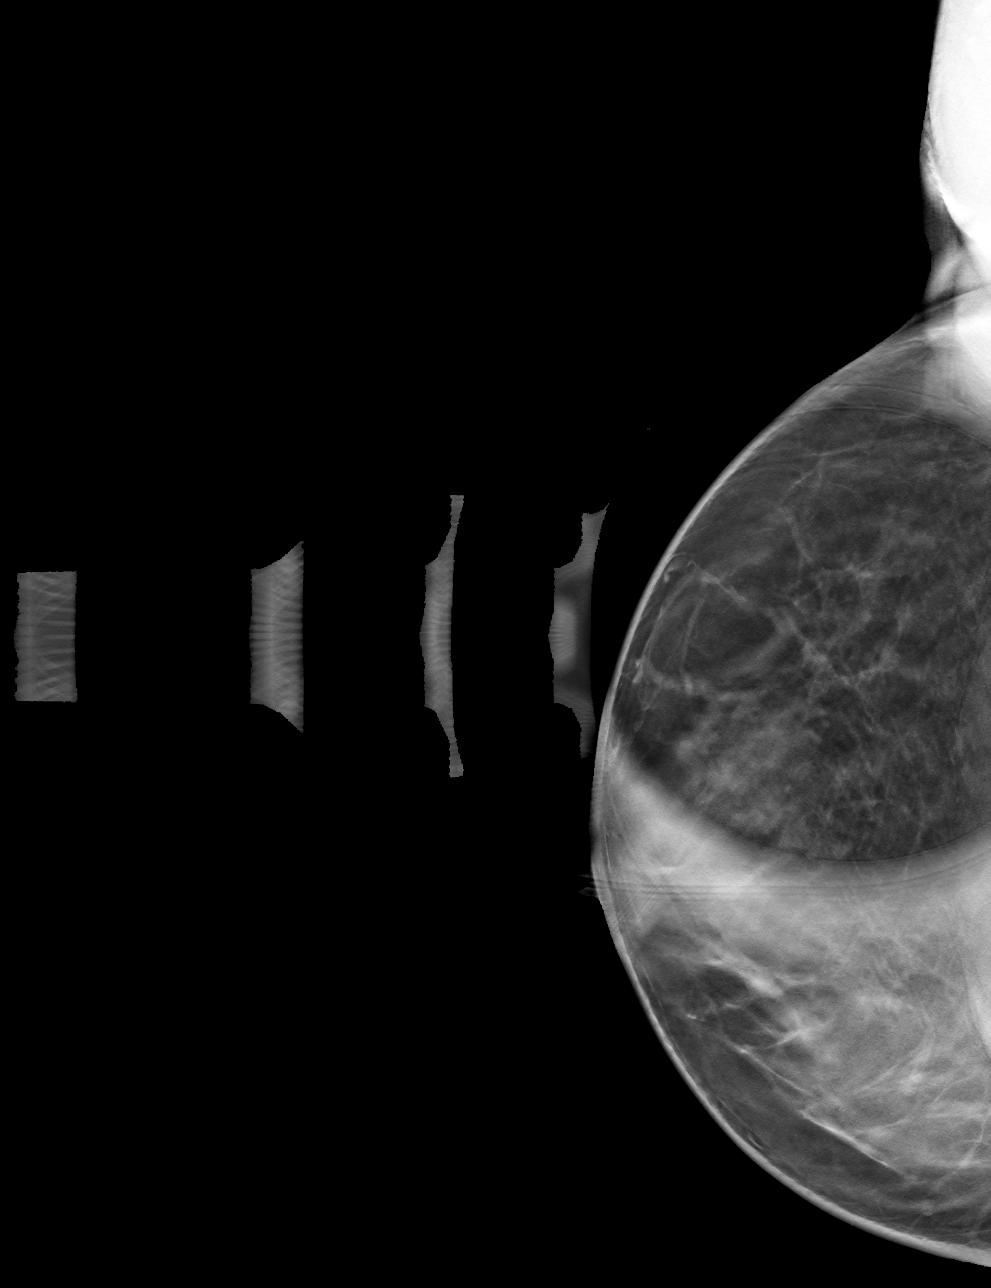

[R ML tomo · tomo slice 25/50.0]
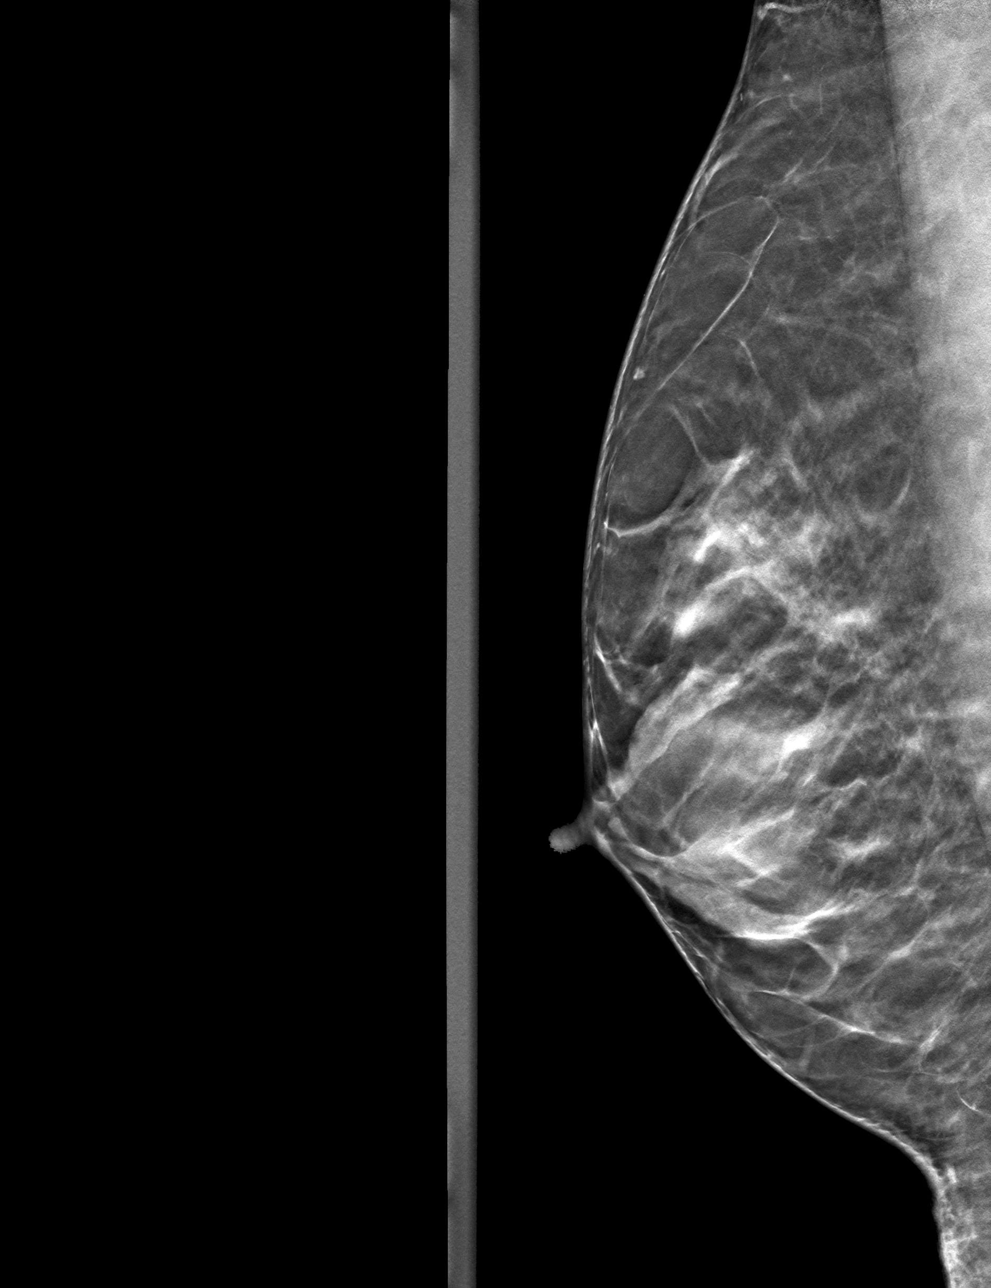

[4 of 12 positions shown; findings below may reference images not displayed]

ACR Breast Density Category c: The breast tissue is heterogeneously
dense, which may obscure small masses.
FINDINGS: The asymmetry in the lateral posterior right breast resolves on spot
compression tomosynthesis imaging, consistent with overlapping
fibroglandular tissue. No suspicious calcifications, masses or areas
of distortion are seen in the right breast.
IMPRESSION: Resolution of the right breast asymmetry consistent with overlapping
fibroglandular tissue.

RECOMMENDATION:
Screening mammogram in one year.(Code:[id number]-[id number])

I have discussed the findings and recommendations with the patient.
If applicable, a reminder letter will be sent to the patient
regarding the next appointment.

BI-RADS CATEGORY  1: Negative.

## 2022-06-12 ENCOUNTER — Ambulatory Visit: Payer: Medicare HMO | Admitting: Family Medicine

## 2022-06-12 ENCOUNTER — Encounter: Payer: Self-pay | Admitting: Family Medicine

## 2022-06-12 VITALS — BP 102/70 | Ht 64.5 in | Wt 142.0 lb

## 2022-06-12 DIAGNOSIS — M542 Cervicalgia: Secondary | ICD-10-CM | POA: Diagnosis not present

## 2022-06-12 DIAGNOSIS — M5416 Radiculopathy, lumbar region: Secondary | ICD-10-CM

## 2022-06-12 DIAGNOSIS — M545 Low back pain, unspecified: Secondary | ICD-10-CM | POA: Diagnosis not present

## 2022-06-12 NOTE — Progress Notes (Unsigned)
PCP: Isaac Bliss, Rayford Halsted, MD  Subjective:   HPI: Patient is a 66 y.o. female here for low back, neck pain.  8/14: Continuing to have left sided low back pain without radicular symptoms. MRI showed L4-5 disc bulge compressing the L5 nerve. She got an epidural injection 2 weeks ago which helped slightly. She has not been doing home exercises. Takes Tylenol as needed, but prefers to not take medications. No bowel/bladder dysfunction.  No numbness, tingling.  9/11: Patient reports low back pain is a little better. Doing physical therapy and home exercises. Going to pilates. No radicular symptoms. Her neck is bothering her more bilaterally. Also no radiation into extremities. She takes turmeric. 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  Past Medical History:  Diagnosis Date   Abdominal pain, left upper quadrant    Acute bronchospasm    Acute neck pain 07/02/2019   Acute sinusitis, unspecified 05/20/2009   Centricity Description: SINUSITIS - ACUTE-NOS Qualifier: Diagnosis of  By: Birdie Riddle MD, Belenda Cruise   Centricity Description: ACUTE SINUSITIS, UNSPECIFIED Qualifier: Diagnosis of  By: Alveta Heimlich MD, Eugene   Centricity Description: SINUSITIS, ACUTE Qualifier: Diagnosis of  By: Koleen Nimrod MD, Dellis Filbert     Anxiety    Atypical chest pain 03/05/2013   Body mass index (BMI) of 23.0-23.9 in adult    Cervicalgia    Colon polyp    Costochondral junction syndrome    Cough    DEPRESSION 08/25/2008   Qualifier: Diagnosis of  By: Ronnald Ramp CMA, Chemira     Diarrhea    Diverticulosis 07/27/2012   Dorsalgia    Dysrhythmia    hx a-fib   Elevated blood pressure reading without diagnosis of hypertension    Endometrial polyp 05/2007   benign   Essential hypertension 09/20/2020   Family history of osteoporosis 12/28/2011   Mother pt had normal Dexa 11/2009    Fatigue    GERD (gastroesophageal reflux disease)    HIP PAIN, RIGHT 04/12/2009   Qualifier: Diagnosis of  By: Oneida Alar MD, KARL      History of hiatal hernia    HYPERLIPIDEMIA 08/25/2008   Qualifier: Diagnosis of  By: Birdie Riddle MD, Belenda Cruise     IBS (irritable bowel syndrome)    Left hip pain 07/30/2013   Left wrist pain 07/30/2013   Lumbar strain, initial encounter 07/28/2019   LUNG NODULE 11/19/2009   Neg CT     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 Please review for accuracy.   Otalgia, unspecified ear    Pain in throat    Palpitations    Paroxysmal atrial fibrillation (HCC)    Peroneal tendinitis of right lower extremity 03/12/2014   Plantar wart 04/02/2014   Pneumonia    RHINITIS 06/21/2009   Qualifier: Diagnosis of  By: Birdie Riddle MD, Belenda Cruise     Right ankle injury, subsequent encounter 01/11/2018   Right foot pain 08/09/2011   Sinusitis, chronic    Skene's gland abscess 2009   patient unaware   Sprain  of unspecified ligament of right ankle, subsequent encounter    TRANSAMINASES, SERUM, ELEVATED 12/07/2009   Qualifier: Diagnosis of  By: Inda Castle FNP, Melissa S    Unspecified cataract    Urinary tract infection, site not specified    Vitamin D deficiency     Current Outpatient Medications on File Prior to Visit  Medication Sig Dispense Refill   apixaban (ELIQUIS) 5 MG TABS tablet Take 1 tablet (5 mg total) by mouth 2 (two) times daily. 60 tablet 11   azelastine (ASTELIN) 0.1 % nasal spray Place 1-2 sprays into both nostrils 2 (two) times daily. (Patient taking differently: Place 1-2 sprays into both nostrils 2 (two) times  daily as needed.) 30 mL 3   baclofen (LIORESAL) 10 MG tablet Take 1 tablet (10 mg total) by mouth 3 (three) times daily as needed for muscle spasms. 60 each 1   Boswellia-Glucosamine-Vit D (OSTEO BI-FLEX ONE PER DAY PO) Take 1 capsule by mouth daily.     Calcium Carbonate-Vitamin D (CALTRATE 600+D PO) Take 1 tablet by mouth daily.     citalopram (CELEXA) 20 MG tablet Take 1.5 tablets (30 mg total) by mouth daily. (Patient taking differently: Take 20 mg by mouth daily.) 145 tablet 1   diltiazem (CARDIZEM) 30 MG tablet Take 1 tablet (30 mg total) by mouth 2 (two) times daily if needed for AFIB 45 tablet 1   flecainide (TAMBOCOR) 50 MG tablet Take 1 tablet (50 mg total) by mouth 2 (two) times daily. (Patient taking differently: Take 50 mg by mouth daily at 2 am.) 180 tablet 3   fluticasone (FLONASE) 50 MCG/ACT nasal spray Place 1-2 sprays into both nostrils daily. (Patient taking differently: Place 1-2 sprays into both nostrils daily as needed for allergies or rhinitis.) 16 g 3   metoprolol succinate (TOPROL-XL) 25 MG 24 hr tablet Take 0.5 tablets (12.5 mg total) by mouth daily. 90 tablet 1   Multiple Vitamins-Minerals (MULTI FOR HER 50+) TABS Take 1 tablet by mouth daily.     omeprazole (PRILOSEC) 20 MG capsule Take 1 capsule (20 mg total) by mouth in the morning 30 minutes before morning meal 90 capsule 0   Probiotic Product (PROBIOTIC PO) Take 1 capsule by mouth daily. Ultra Flora     traMADol (ULTRAM) 50 MG tablet Take 1 tablet (50 mg total) by mouth every 6 (six) hours as needed. 20 tablet 0   vitamin C (ASCORBIC ACID) 500 MG tablet Take 500 mg by mouth daily.     zinc gluconate 50 MG tablet Take 50 mg by mouth daily.     No current facility-administered medications on file prior to visit.    Past Surgical History:  Procedure Laterality Date   BROW LIFT Bilateral 12/29/2021   Procedure: BLEPHAROPLASTY;  Surgeon: Lennice Sites, MD;  Location: [facility];  Service: Plastics;  Laterality: Bilateral;    COLONOSCOPY     x several   DILATION AND CURETTAGE OF UTERUS  05/2007   GYNECOLOGIC CRYOSURGERY  1980   HYSTEROSCOPY WITH D & C N/A 03/30/2017   Procedure: DILATATION & CURETTAGE/HYSTEROSCOPY WITH ULTRASOUND GUIDANCE;  Surgeon: Megan Salon, MD;  Location: [facility];  Service: Gynecology;  Laterality: N/A;   UPPER GI ENDOSCOPY     uterine ablation  2007    Allergies  Allergen Reactions   Augmentin [Amoxicillin-Pot Clavulanate] Diarrhea    BP 102/70 (BP Location: Left Arm, Patient Position: Sitting)   Ht 5' 4.5" (1.638 m)   Wt 142 lb (64.4 kg)  LMP 10/02/2006 (Approximate)   BMI 24.00 kg/m      08/30/2020    9:03 AM 06/08/2021    9:34 AM 08/22/2021   10:20 AM 11/09/2021   10:19 AM  [facility] Adult Exercise  Frequency of aerobic exercise (# of days/week) '7 7 7 6  '$ Average time in minutes 60 60 60 60  Frequency of strengthening activities (# of days/week) '3 3 2 3        '$ No data to display              Objective:  Physical Exam:  Gen: NAD, comfortable in exam room  Neck: No gross deformity, swelling, bruising. TTP bilateral trapezius, cervical paraspinal muscles mildly.  No midline/bony TTP. Lacks 5 degrees rotation to right.  Extension only to 15 degrees.  Full flexion. BUE strength 5/5.   Sensation intact to light touch.   2+ equal reflexes in triceps, biceps, brachioradialis tendons. NV intact distal BUEs.  Back: No gross deformity, scoliosis. No paraspinal TTP.  No midline or bony TTP. Strength LEs grossly normal.     Assessment & Plan:  1. Neck pain - 2/2 known arthritis.  Tylenol, topical medications.  Consider boswellia in addition to her turmeric.  Consider repeating physical therapy, consider ESI if pain worsens.  F/u in 6 weeks.  2. Low back pain - L4-5 disc bulge with irritation of left L5 nerve root.  She is improving albeit slowly.  Transition from physical therapy to her home exercises.  Tylenol, topical medications.

## 2022-06-12 NOTE — Patient Instructions (Signed)
These are the different medications you can take for this: Tylenol '500mg'$  1-2 tabs three times a day for pain. Capsaicin, aspercreme, or biofreeze topically up to four times a day may also help with pain. Some supplements that may help for arthritis: Boswellia extract, curcumin, pycnogenol Transition from physical therapy to just a home exercise program now. Follow up with me in 6 weeks or as needed if you're doing well.

## 2022-06-13 ENCOUNTER — Encounter: Payer: Self-pay | Admitting: Family Medicine

## 2022-06-13 ENCOUNTER — Ambulatory Visit (INDEPENDENT_AMBULATORY_CARE_PROVIDER_SITE_OTHER): Payer: Medicare HMO | Admitting: Licensed Clinical Social Worker

## 2022-06-13 ENCOUNTER — Encounter (HOSPITAL_COMMUNITY): Payer: Self-pay | Admitting: Licensed Clinical Social Worker

## 2022-06-13 DIAGNOSIS — F4321 Adjustment disorder with depressed mood: Secondary | ICD-10-CM

## 2022-06-13 DIAGNOSIS — R69 Illness, unspecified: Secondary | ICD-10-CM | POA: Diagnosis not present

## 2022-06-13 NOTE — Progress Notes (Signed)
Virtual Visit via Video Note  I connected with Kara Warren on 06/13/22 at 11:00 AM EDT by a video enabled telemedicine application and verified that I am speaking with the correct person using two identifiers.  Location: Patient: home Provider: home office   I discussed the limitations of evaluation and management by telemedicine and the availability of in person appointments. The patient expressed understanding and agreed to proceed.     I discussed the assessment and treatment plan with the patient. The patient was provided an opportunity to ask questions and all were answered. The patient agreed with the plan and demonstrated an understanding of the instructions.   The patient was advised to call back or seek an in-person evaluation if the symptoms worsen or if the condition fails to improve as anticipated.  I provided 45 minutes of non-face-to-face time during this encounter.   Mindi Curling, LCSW   THERAPIST PROGRESS NOTE  Session Time: 11:00AM-11:45AM  Participation Level: Active  Behavioral Response: NeatAlertEuthymic  Type of Therapy: Individual Therapy  Treatment Goals addressed: Kara Warren will identify patterns in relationships that are related to her level of self esteem and make choices more in line with higher levels of self esteem and self-compassion  ProgressTowards Goals: Progressing  Interventions: Motivational Interviewing  Summary: Kara Warren is a 66 y.o. female who presents with Adjustment Disorder with depressed mood.   Suicidal/Homicidal: Nowithout intent/plan  Therapist Response: Kara Warren engaged well in virtual session with Pension scheme manager. Clinician utilized MI OARS to reflect and summarize thoughts and feelings. Clinician processed updates in friendships and Kara Warren's ability to set appropriate boundaries. Clinician reflected the challenges and overall joy that comes from setting boundaries. Clinician discussed updates in relationship and noted some  important changes in her ability to self-soothe and wait for more information before getting upset. Clinician identified the usefulness of her partner having other friends to "outsource" certain conversations and activities that she may not enjoy. Clinician validated the importance of reality testing her feelings before reacting.  Plan: Return again in 2 weeks.  Diagnosis: Adjustment disorder with depressed mood  Collaboration of Care: Other none required in this session  Patient/Guardian was advised Release of Information must be obtained prior to any record release in order to collaborate their care with an outside provider. Patient/Guardian was advised if they have not already done so to contact the registration department to sign all necessary forms in order for [place] to release information regarding their care.   Consent: Patient/Guardian gives verbal consent for treatment and assignment of benefits for services provided during this visit. Patient/Guardian expressed understanding and agreed to proceed.   Jobe Marker [place], LCSW 06/13/2022

## 2022-06-22 ENCOUNTER — Other Ambulatory Visit (HOSPITAL_COMMUNITY): Payer: Self-pay

## 2022-06-22 ENCOUNTER — Encounter: Payer: Self-pay | Admitting: Cardiovascular Disease

## 2022-06-23 ENCOUNTER — Ambulatory Visit
Admission: EM | Admit: 2022-06-23 | Discharge: 2022-06-23 | Disposition: A | Discharge status: Home / Self Care | Payer: Medicare HMO | Attending: Urgent Care | Admitting: Urgent Care

## 2022-06-23 ENCOUNTER — Other Ambulatory Visit (HOSPITAL_COMMUNITY): Payer: Self-pay

## 2022-06-23 ENCOUNTER — Encounter: Payer: Self-pay | Admitting: Emergency Medicine

## 2022-06-23 DIAGNOSIS — R0981 Nasal congestion: Secondary | ICD-10-CM

## 2022-06-23 DIAGNOSIS — J019 Acute sinusitis, unspecified: Secondary | ICD-10-CM | POA: Diagnosis not present

## 2022-06-23 DIAGNOSIS — B9789 Other viral agents as the cause of diseases classified elsewhere: Secondary | ICD-10-CM | POA: Diagnosis not present

## 2022-06-23 MED ORDER — GUAIFENESIN ER 600 MG PO TB12
600.0000 mg | ORAL_TABLET | Freq: Two times a day (BID) | ORAL | 0 refills | Status: DC | PRN
  Filled 2022-06-23: qty 20, 10d supply, fill #0

## 2022-06-23 MED ORDER — IPRATROPIUM BROMIDE 0.03 % NA SOLN
2.0000 | Freq: Two times a day (BID) | NASAL | 0 refills | Status: AC
  Filled 2022-06-23: qty 30, 43d supply, fill #0

## 2022-06-23 MED ORDER — MONTELUKAST SODIUM 10 MG PO TABS
10.0000 mg | ORAL_TABLET | Freq: Every day | ORAL | 0 refills | Status: DC
  Filled 2022-06-23: qty 20, 20d supply, fill #0

## 2022-06-23 NOTE — ED Triage Notes (Signed)
Pt states since Tuesday she has been having a lot of nasal congestion and sinus pressure. States she has tried some OTC meds with no relief. She had x2 negative covid test at home.

## 2022-06-23 NOTE — ED Provider Notes (Signed)
[facility]    CSN: [id number] Arrival date & time: 06/23/22  1155      History   Chief Complaint Chief Complaint  Patient presents with  . Nasal Congestion    Entered by patient    HPI AMIREE NO is a 66 y.o. female.   HPI  Past Medical History:  Diagnosis Date  . Abdominal pain, left upper quadrant   . Acute bronchospasm   . Acute neck pain 07/02/2019  . Acute sinusitis, unspecified 05/20/2009   Centricity Description: SINUSITIS - ACUTE-NOS Qualifier: Diagnosis of  By: Birdie Riddle MD, Belenda Cruise   Centricity Description: ACUTE SINUSITIS, UNSPECIFIED Qualifier: Diagnosis of  By: Alveta Heimlich MD, Cornelia Copa   Centricity Description: SINUSITIS, ACUTE Qualifier: Diagnosis of  By: Koleen Nimrod MD, Dellis Filbert    . Anxiety   . Atypical chest pain 03/05/2013  . Body mass index (BMI) of 23.0-23.9 in adult   . Cervicalgia   . Colon polyp   . Costochondral junction syndrome   . Cough   . DEPRESSION 08/25/2008   Qualifier: Diagnosis of  By: Ronnald Ramp CMA, Chemira    . Diarrhea   . Diverticulosis 07/27/2012  . Dorsalgia   . Dysrhythmia    hx a-fib  . Elevated blood pressure reading without diagnosis of hypertension   . Endometrial polyp 05/2007   benign  . Essential hypertension 09/20/2020  . Family history of osteoporosis 12/28/2011   Mother pt had normal Dexa 11/2009   . Fatigue   . GERD (gastroesophageal reflux disease)   . HIP PAIN, RIGHT 04/12/2009   Qualifier: Diagnosis of  By: Oneida Alar MD, KARL    . History of hiatal hernia   . HYPERLIPIDEMIA 08/25/2008   Qualifier: Diagnosis of  By: Birdie Riddle MD, Belenda Cruise    . IBS (irritable bowel syndrome)   . Left hip pain 07/30/2013  . Left wrist pain 07/30/2013  . Lumbar strain, initial encounter 07/28/2019  . LUNG NODULE 11/19/2009   Neg CT    . 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 Menopause   . Migraine    Hx - Not current problem, Hormone related  . NEUTROPENIA UNSPECIFIED 10/07/2008   Qualifier:  Diagnosis of  By: Birdie Riddle MD, Belenda Cruise    . [place] LUNG FIELD 11/19/2009   Qualifier: Diagnosis of  By: Alveta Heimlich MD, Cornelia Copa    . Nonspecific (abnormal) findings on radiological and other examination of body structure 11/19/2009   Qualifier: Diagnosis of By: Alveta Heimlich MD, Bennie Dallas list entry automatically replaced. Please review for accuracy.  . Otalgia, unspecified ear   . Pain in throat   . Palpitations   . Paroxysmal atrial fibrillation (HCC)   . Peroneal tendinitis of right lower extremity 03/12/2014  . Plantar wart 04/02/2014  . Pneumonia   . RHINITIS 06/21/2009   Qualifier: Diagnosis of  By: Birdie Riddle MD, Belenda Cruise    . Right ankle injury, subsequent encounter 01/11/2018  . Right foot pain 08/09/2011  . Sinusitis, chronic   . Skene's gland abscess 2009   patient unaware  . Sprain of unspecified ligament of right ankle, subsequent encounter   . TRANSAMINASES, SERUM, ELEVATED 12/07/2009   Qualifier: Diagnosis of  By: Inda Castle FNP, Wellington Hampshire   . Unspecified cataract   . Urinary tract infection, site not specified   . Vitamin D deficiency     Patient Active Problem List   Diagnosis Date Noted  . Acquired thrombophilia ([place]) 09/26/2021  . Colonoscopy causing  post-procedural bleeding 09/20/2020  . Essential hypertension 09/20/2020  . PAF (paroxysmal atrial fibrillation) ([place]) 09/20/2020  . Plantar wart 04/02/2014  . Peroneal tendinitis of right lower extremity 03/12/2014  . Diverticulosis 07/27/2012  . Sinusitis, chronic   . Colon polyp   . Menopause   . Migraine   . [place] LUNG FIELD 11/19/2009  . HIP PAIN, RIGHT 04/12/2009  . Hyperlipidemia 08/25/2008  . Depression 08/25/2008    Past Surgical History:  Procedure Laterality Date  . BROW LIFT Bilateral 12/29/2021   Procedure: BLEPHAROPLASTY;  Surgeon: Lennice Sites, MD;  Location: [facility];  Service: Plastics;  Laterality: Bilateral;  . COLONOSCOPY     x several   . DILATION AND CURETTAGE OF UTERUS  05/2007  . GYNECOLOGIC CRYOSURGERY  1980  . HYSTEROSCOPY WITH D & C N/A 03/30/2017   Procedure: DILATATION & CURETTAGE/HYSTEROSCOPY WITH ULTRASOUND GUIDANCE;  Surgeon: Megan Salon, MD;  Location: [facility];  Service: Gynecology;  Laterality: N/A;  . UPPER GI ENDOSCOPY    . uterine ablation  2007    OB History     Gravida  0   Para  0   Term  0   Preterm  0   AB  0   Living  0      SAB  0   IAB  0   Ectopic  0   Multiple  0   Live Births  0            Home Medications    Prior to Admission medications   Medication Sig Start Date End Date Taking? Authorizing Provider  apixaban (ELIQUIS) 5 MG TABS tablet Take 1 tablet (5 mg total) by mouth 2 (two) times daily. 01/02/22   Burnell Blanks, MD  azelastine (ASTELIN) 0.1 % nasal spray Place 1-2 sprays into both nostrils 2 (two) times daily. Patient taking differently: Place 1-2 sprays into both nostrils 2 (two) times daily as needed. 03/13/22   Isaac Bliss, Rayford Halsted, MD  baclofen (LIORESAL) 10 MG tablet Take 1 tablet (10 mg total) by mouth 3 (three) times daily as needed for muscle spasms. 01/16/22   Hudnall, Sharyn Lull, MD  Boswellia-Glucosamine-Vit D (OSTEO BI-FLEX ONE PER DAY PO) Take 1 capsule by mouth daily.    [provider]  Calcium Carbonate-Vitamin D (CALTRATE 600+D PO) Take 1 tablet by mouth daily.    [provider]  citalopram (CELEXA) 20 MG tablet Take 1.5 tablets (30 mg total) by mouth daily. Patient taking differently: Take 20 mg by mouth daily. 01/24/22   Isaac Bliss, Rayford Halsted, MD  diltiazem (CARDIZEM) 30 MG tablet Take 1 tablet (30 mg total) by mouth 2 (two) times daily if needed for AFIB 04/14/22   Burnell Blanks, MD  flecainide (TAMBOCOR) 50 MG tablet Take 1 tablet (50 mg total) by mouth 2 (two) times daily. Patient taking differently: Take 50 mg by mouth daily at 2 am. 03/31/22   Werner Lean, MD  fluticasone  (FLONASE) 50 MCG/ACT nasal spray Place 1-2 sprays into both nostrils daily. Patient taking differently: Place 1-2 sprays into both nostrils daily as needed for allergies or rhinitis. 07/12/21     metoprolol succinate (TOPROL-XL) 25 MG 24 hr tablet Take 0.5 tablets (12.5 mg total) by mouth daily. 04/19/22   Nahser, Wonda Cheng, MD  Multiple Vitamins-Minerals (MULTI FOR HER 50+) TABS Take 1 tablet by mouth daily.    [provider]  omeprazole (PRILOSEC) 20  MG capsule Take 1 capsule (20 mg total) by mouth in the morning 30 minutes before morning meal 04/17/22   Isaac Bliss, Rayford Halsted, MD  Probiotic Product (PROBIOTIC PO) Take 1 capsule by mouth daily. Ultra Flora    [provider]  traMADol (ULTRAM) 50 MG tablet Take 1 tablet (50 mg total) by mouth every 6 (six) hours as needed. 04/06/22   Hudnall, Sharyn Lull, MD  vitamin C (ASCORBIC ACID) 500 MG tablet Take 500 mg by mouth daily.    [provider]  zinc gluconate 50 MG tablet Take 50 mg by mouth daily.    [provider]    Family History Family History  Problem Relation Age of Onset  . Colon polyps Father   . Ulcerative colitis Father   . CAD Father        Stent placement at age 47  . Glaucoma Father   . Hypertension Father   . Colitis Father   . Breast cancer Paternal Grandmother   . Colon cancer Maternal Grandfather   . Colon polyps Mother   . Osteoporosis Mother   . Thyroid disease Mother        hypo  . Glaucoma Mother   . Diverticulosis Mother   . Sudden death Neg Hx   . Hyperlipidemia Neg Hx   . Heart attack Neg Hx   . Diabetes Neg Hx     Social History Social History   Tobacco Use  . Smoking status: Former    Packs/day: 0.10    Years: 3.00    Total pack years: 0.30    Types: Cigarettes    Quit date: 10/02/1978    Years since quitting: 43.7  . Smokeless tobacco: Never  Vaping Use  . Vaping Use: Never used  Substance Use Topics  . Alcohol use: Yes    Alcohol/week: 7.0 standard  drinks of alcohol    Types: 7 Glasses of wine per week    Comment: per week  . Drug use: No     Allergies   Augmentin [amoxicillin-pot clavulanate]   Review of Systems Review of Systems   Physical Exam Triage Vital Signs ED Triage Vitals  Enc Vitals Group     BP 06/23/22 1227 128/77     Pulse Rate 06/23/22 1227 (!) 50     Resp 06/23/22 1227 18     Temp 06/23/22 1227 97.8 F (36.6 C)     Temp Source 06/23/22 1227 Oral     SpO2 06/23/22 1227 100 %     Weight 06/23/22 1230 145 lb (65.8 kg)     Height --      Head Circumference --      Peak Flow --      Pain Score 06/23/22 1229 0     Pain Loc --      Pain Edu? --      Excl. in [place]? --    No data found.  Updated Vital Signs BP 128/77 (BP Location: Right Arm)   Pulse (!) 50 Comment: pt states normal on metoprolol  Temp 97.8 F (36.6 C) (Oral)   Resp 18   Wt 145 lb (65.8 kg)   LMP 10/02/2006 (Approximate)   SpO2 100%   BMI 24.50 kg/m   Visual Acuity Right Eye Distance:   Left Eye Distance:   Bilateral Distance:    Right Eye Near:   Left Eye Near:    Bilateral Near:     Physical Exam   UC Treatments /  Results  Labs (all labs ordered are listed, but only abnormal results are displayed) Labs Reviewed - No data to display  EKG   Radiology No results found.  Procedures Procedures (including critical care time)  Medications Ordered in UC Medications - No data to display  Initial Impression / Assessment and Plan / UC Course  I have reviewed the triage vital signs and the nursing notes.  Pertinent labs & imaging results that were available during my care of the patient were reviewed by me and considered in my medical decision making (see chart for details).     *** Final Clinical Impressions(s) / UC Diagnoses   Final diagnoses:  None   Discharge Instructions   None    ED Prescriptions   None    PDMP not reviewed this encounter.

## 2022-06-23 NOTE — Discharge Instructions (Signed)
Your symptoms sound consistent with viral sinusitis and nasal congestion. Please start using the ipratropium nasal spray prescribed today. Stop your other at home nasal sprays. Please take the montelukast nightly until symptoms resolve. Use the Mucinex prescribed today with plenty of water. You may continue the cetirizine every morning. Plain saline rinses may also be beneficial. Please follow-up with your PCP should symptoms persist or change.

## 2022-06-27 ENCOUNTER — Encounter: Payer: Self-pay | Admitting: Internal Medicine

## 2022-06-27 ENCOUNTER — Ambulatory Visit (INDEPENDENT_AMBULATORY_CARE_PROVIDER_SITE_OTHER): Payer: Medicare HMO | Admitting: Licensed Clinical Social Worker

## 2022-06-27 ENCOUNTER — Ambulatory Visit (INDEPENDENT_AMBULATORY_CARE_PROVIDER_SITE_OTHER): Payer: Medicare HMO | Admitting: Internal Medicine

## 2022-06-27 VITALS — BP 110/70 | HR 53 | Temp 98.4°F | Wt 146.0 lb

## 2022-06-27 DIAGNOSIS — F4321 Adjustment disorder with depressed mood: Secondary | ICD-10-CM

## 2022-06-27 DIAGNOSIS — R69 Illness, unspecified: Secondary | ICD-10-CM | POA: Diagnosis not present

## 2022-06-27 DIAGNOSIS — J069 Acute upper respiratory infection, unspecified: Secondary | ICD-10-CM

## 2022-06-27 NOTE — Progress Notes (Signed)
Established Patient Office Visit     CC/Reason for Visit: URI symptoms  HPI: Kara Warren is a 66 y.o. female who is coming in today for the above mentioned reasons.  She has been having these now for 8 days.  She tested twice negative for COVID last week.  She went to urgent care and was given Singulair and nasal spray and has had some relief.  Her symptoms have included sneezing, nonproductive cough, congestion, rhinorrhea, postnasal drip and headache.  No fever or body aches.   Past Medical/Surgical History: Past Medical History:  Diagnosis Date   Abdominal pain, left upper quadrant    Acute bronchospasm    Acute neck pain 07/02/2019   Acute sinusitis, unspecified 05/20/2009   Centricity Description: SINUSITIS - ACUTE-NOS Qualifier: Diagnosis of  By: Birdie Riddle MD, Belenda Cruise   Centricity Description: ACUTE SINUSITIS, UNSPECIFIED Qualifier: Diagnosis of  By: Alveta Heimlich MD, [place]   Centricity Description: SINUSITIS, ACUTE Qualifier: Diagnosis of  By: Koleen Nimrod MD, Dellis Filbert     Anxiety    Atypical chest pain 03/05/2013   Body mass index (BMI) of 23.0-23.9 in adult    Cervicalgia    Colon polyp    Costochondral junction syndrome    Cough    DEPRESSION 08/25/2008   Qualifier: Diagnosis of  By: Ronnald Ramp CMA, Chemira     Diarrhea    Diverticulosis 07/27/2012   Dorsalgia    Dysrhythmia    hx a-fib   Elevated blood pressure reading without diagnosis of hypertension    Endometrial polyp 05/2007   benign   Essential hypertension 09/20/2020   Family history of osteoporosis 12/28/2011   Mother pt had normal Dexa 11/2009    Fatigue    GERD (gastroesophageal reflux disease)    HIP PAIN, RIGHT 04/12/2009   Qualifier: Diagnosis of  By: Oneida Alar MD, KARL     History of hiatal hernia    HYPERLIPIDEMIA 08/25/2008   Qualifier: Diagnosis of  By: Birdie Riddle MD, Belenda Cruise     IBS (irritable bowel syndrome)    Left hip pain 07/30/2013   Left wrist pain 07/30/2013   Lumbar strain, initial encounter  07/28/2019   LUNG NODULE 11/19/2009   Neg CT     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 Please review for accuracy.   Otalgia, unspecified ear    Pain in throat    Palpitations    Paroxysmal atrial fibrillation (HCC)    Peroneal tendinitis of right lower extremity 03/12/2014   Plantar wart 04/02/2014   Pneumonia    RHINITIS 06/21/2009   Qualifier: Diagnosis of  By: Birdie Riddle MD, Belenda Cruise     Right ankle injury, subsequent encounter 01/11/2018   Right foot pain 08/09/2011   Sinusitis, chronic    Skene's gland abscess 2009   patient unaware   Sprain of unspecified ligament of right ankle, subsequent encounter    TRANSAMINASES, SERUM, ELEVATED 12/07/2009   Qualifier: Diagnosis of  By: Inda Castle FNP, Wellington Hampshire  Unspecified cataract    Urinary tract infection, site not specified    Vitamin D deficiency     Past Surgical History:  Procedure Laterality Date   BROW LIFT Bilateral 12/29/2021   Procedure: BLEPHAROPLASTY;  Surgeon: Lennice Sites, MD;  Location: [facility];  Service: Plastics;  Laterality: Bilateral;   COLONOSCOPY     x several   DILATION AND CURETTAGE OF UTERUS  05/2007   GYNECOLOGIC CRYOSURGERY  1980   HYSTEROSCOPY WITH D & C N/A 03/30/2017   Procedure: DILATATION & CURETTAGE/HYSTEROSCOPY WITH ULTRASOUND GUIDANCE;  Surgeon: Megan Salon, MD;  Location: [facility];  Service: Gynecology;  Laterality: N/A;   UPPER GI ENDOSCOPY     uterine ablation  2007    Social History:  reports that she quit  smoking about 43 years ago. Her smoking use included cigarettes. She has a 0.30 pack-year smoking history. She has never used smokeless tobacco. She reports current alcohol use of about 7.0 standard drinks of alcohol per week. She reports that she does not use drugs.  Allergies: Allergies  Allergen Reactions   Augmentin [Amoxicillin-Pot Clavulanate] Diarrhea    Family History:  Family History  Problem Relation Age of Onset   Colon polyps Father    Ulcerative colitis Father    CAD Father        Stent placement at age 68   Glaucoma Father    Hypertension Father    Colitis Father    Breast cancer Paternal Grandmother    Colon cancer Maternal Grandfather    Colon polyps Mother    Osteoporosis Mother    Thyroid disease Mother        hypo   Glaucoma Mother    Diverticulosis Mother    Sudden death Neg Hx    Hyperlipidemia Neg Hx    Heart attack Neg Hx    Diabetes Neg Hx      Current Outpatient Medications:    apixaban (ELIQUIS) 5 MG TABS tablet, Take 1 tablet (5 mg total) by mouth 2 (two) times daily., Disp: 60 tablet, Rfl: 11   Boswellia-Glucosamine-Vit D (OSTEO BI-FLEX ONE PER DAY PO), Take 1 capsule by mouth daily., Disp: , Rfl:    Calcium Carbonate-Vitamin D (CALTRATE 600+D PO), Take 1 tablet by mouth daily., Disp: , Rfl:    citalopram (CELEXA) 20 MG tablet, Take 1.5 tablets (30 mg total) by mouth daily. (Patient taking differently: Take 20 mg by mouth daily.), Disp: 145 tablet, Rfl: 1   diltiazem (CARDIZEM) 30 MG tablet, Take 1 tablet (30 mg total) by mouth 2 (two) times daily if needed for AFIB, Disp: 45 tablet, Rfl: 1   flecainide (TAMBOCOR) 50 MG tablet, Take 1 tablet (50 mg total) by mouth 2 (two) times daily. (Patient taking differently: Take 50 mg by mouth daily at 2 am.), Disp: 180 tablet, Rfl: 3   fluticasone (FLONASE) 50 MCG/ACT nasal spray, Place 1-2 sprays into both nostrils daily. (Patient taking differently: Place 1-2 sprays into both nostrils daily as needed for  allergies or rhinitis.), Disp: 16 g, Rfl: 3   guaiFENesin (MUCINEX) 600 MG 12 hr tablet, Take 1 tablet (600 mg total) by mouth 2 (two) times daily as needed for to loosen phlegm., Disp: 20 tablet, Rfl: 0   ipratropium (ATROVENT) 0.03 % nasal spray, Place 2 sprays into both nostrils every 12 (twelve) hours. As needed., Disp: 30 mL, Rfl: 0   metoprolol succinate (TOPROL-XL) 25 MG 24 hr tablet, Take 0.5 tablets (12.5 mg total) by mouth  daily., Disp: 90 tablet, Rfl: 1   montelukast (SINGULAIR) 10 MG tablet, Take 1 tablet (10 mg total) by mouth at bedtime., Disp: 20 tablet, Rfl: 0   Multiple Vitamins-Minerals (MULTI FOR HER 50+) TABS, Take 1 tablet by mouth daily., Disp: , Rfl:    omeprazole (PRILOSEC) 20 MG capsule, Take 1 capsule (20 mg total) by mouth in the morning 30 minutes before morning meal, Disp: 90 capsule, Rfl: 0   Probiotic Product (PROBIOTIC PO), Take 1 capsule by mouth daily. Ultra Flora, Disp: , Rfl:    traMADol (ULTRAM) 50 MG tablet, Take 1 tablet (50 mg total) by mouth every 6 (six) hours as needed., Disp: 20 tablet, Rfl: 0   vitamin C (ASCORBIC ACID) 500 MG tablet, Take 500 mg by mouth daily., Disp: , Rfl:    zinc gluconate 50 MG tablet, Take 50 mg by mouth daily., Disp: , Rfl:   Review of Systems:  Constitutional: Denies fever, chills, diaphoresis. HEENT: Denies photophobia, eye pain, redness,  mouth sores, trouble swallowing, neck pain, neck stiffness and tinnitus.   Respiratory: Denies SOB, DOE,  chest tightness,  and wheezing.   Cardiovascular: Denies chest pain, palpitations and leg swelling.  Gastrointestinal: Denies nausea, vomiting, abdominal pain, diarrhea, constipation, blood in stool and abdominal distention.  Genitourinary: Denies dysuria, urgency, frequency, hematuria, flank pain and difficulty urinating.  Endocrine: Denies: hot or cold intolerance, sweats, changes in hair or nails, polyuria, polydipsia. Musculoskeletal: Denies myalgias, back pain, joint swelling,  arthralgias and gait problem.  Skin: Denies pallor, rash and wound.  Neurological: Denies dizziness, seizures, syncope, weakness, light-headedness, numbness and headaches.  Hematological: Denies adenopathy. Easy bruising, personal or family bleeding history  Psychiatric/Behavioral: Denies suicidal ideation, mood changes, confusion, nervousness, sleep disturbance and agitation    Physical Exam: Vitals:   06/27/22 1501  BP: 110/70  Pulse: (!) 53  Temp: 98.4 F (36.9 C)  TempSrc: Oral  SpO2: 98%  Weight: 146 lb (66.2 kg)    Body mass index is 24.67 kg/m.   Constitutional: NAD, calm, comfortable Eyes: PERRL, lids and conjunctivae normal ENMT: Mucous membranes are moist. Neurologic: CN 2-12 grossly intact. Sensation intact, DTR normal. Strength 5/5 in all 4.  Psychiatric: Normal judgment and insight. Alert and oriented x 3. Normal mood.    Impression and Plan:  Viral upper respiratory tract infection -Given exam findings, PNA, pharyngitis, ear infection are not likely, hence abx have not been prescribed. -Have advised rest, fluids, OTC antihistamines, cough suppressants and mucinex. -RTC if no improvement in 10-14 days.    Time spent:20 minutes reviewing chart, interviewing and examining patient and formulating plan of care.       Lelon Frohlich, MD [facility]

## 2022-06-28 DIAGNOSIS — H524 Presbyopia: Secondary | ICD-10-CM | POA: Diagnosis not present

## 2022-06-29 ENCOUNTER — Encounter (HOSPITAL_COMMUNITY): Payer: Self-pay | Admitting: Licensed Clinical Social Worker

## 2022-06-29 NOTE — Progress Notes (Signed)
   THERAPIST PROGRESS NOTE  Session Time: 11:00am-11:55am  Participation Level: Active  Behavioral Response: Well GroomedAlertDepressed  Type of Therapy: Individual Therapy  Treatment Goals addressed: [name] will identify patterns in relationships that are related to her level of self esteem and make choices more in line with higher levels of self esteem and self-compassion  ProgressTowards Goals: Progressing  Interventions: CBT and Motivational Interviewing  Summary: Kara Warren is a 66 y.o. female who presents with Adjustment Disorder with depressed mood.   Suicidal/Homicidal: Nowithout intent/plan  Therapist Response: Jaydee engaged well with clinician in individual session. Clinician utilized CBT and MI OARS to reflect and summarize thoughts and feelings about events over the past 2 weeks. [name] shared ongoing challenges and recent realizations that her partner is not prioritizing her in his life. Clinician processed recent events, which have really impacted [name] emotionally. Clinician explored [name]'s motivation levels to be more direct in her communication about her expectations of the relationship. Clinician also role played specific scripts about how to share her feelings, her hopes, and her needs.   Plan: Return again in 2 weeks.  Diagnosis: Adjustment disorder with depressed mood  Collaboration of Care: Other none required in this session  Patient/Guardian was advised Release of Information must be obtained prior to any record release in order to collaborate their care with an outside provider. Patient/Guardian was advised if they have not already done so to contact the registration department to sign all necessary forms in order for [place] to release information regarding their care.   Consent: Patient/Guardian gives verbal consent for treatment and assignment of benefits for services provided during this visit. Patient/Guardian expressed understanding and agreed to proceed.    Jobe Marker Bucklin, LCSW 06/29/2022

## 2022-06-30 DIAGNOSIS — M545 Low back pain, unspecified: Secondary | ICD-10-CM | POA: Diagnosis not present

## 2022-07-04 ENCOUNTER — Ambulatory Visit (HOSPITAL_COMMUNITY): Payer: Medicare HMO | Admitting: Licensed Clinical Social Worker

## 2022-07-05 ENCOUNTER — Other Ambulatory Visit (HOSPITAL_COMMUNITY): Payer: Self-pay

## 2022-07-05 MED ORDER — FLUAD QUADRIVALENT 0.5 ML IM PRSY
0.5000 mL | PREFILLED_SYRINGE | INTRAMUSCULAR | 0 refills | Status: DC
Start: 2022-07-05 — End: 2023-01-24
  Filled 2022-07-05: qty 0.5, 1d supply, fill #0

## 2022-07-06 ENCOUNTER — Encounter: Payer: Self-pay | Admitting: Internal Medicine

## 2022-07-07 DIAGNOSIS — M545 Low back pain, unspecified: Secondary | ICD-10-CM | POA: Diagnosis not present

## 2022-07-10 ENCOUNTER — Other Ambulatory Visit (HOSPITAL_COMMUNITY): Payer: Self-pay

## 2022-07-10 ENCOUNTER — Telehealth: Payer: Medicare HMO | Admitting: Emergency Medicine

## 2022-07-10 ENCOUNTER — Other Ambulatory Visit: Payer: Self-pay | Admitting: Internal Medicine

## 2022-07-10 DIAGNOSIS — J329 Chronic sinusitis, unspecified: Secondary | ICD-10-CM

## 2022-07-10 MED ORDER — DOXYCYCLINE HYCLATE 100 MG PO CAPS
100.0000 mg | ORAL_CAPSULE | Freq: Two times a day (BID) | ORAL | 0 refills | Status: DC
  Filled 2022-07-10: qty 20, 10d supply, fill #0

## 2022-07-10 NOTE — Progress Notes (Signed)
E-Visit for Sinus Problems  We are sorry that you are not feeling well.  Here is how we plan to help!  Based on what you have shared with me it looks like you have sinusitis.  Sinusitis is inflammation and infection in the sinus cavities of the head.  Based on your presentation I believe you most likely have Acute Bacterial Sinusitis.  This is an infection caused by bacteria and is treated with antibiotics. I have prescribed Doxycycline '100mg'$  by mouth twice a day for 10 days. You may use an oral decongestant such as Mucinex D or if you have glaucoma or high blood pressure use plain Mucinex. Saline nasal spray help and can safely be used as often as needed for congestion.  If you develop worsening sinus pain, fever or notice severe headache and vision changes, or if symptoms are not better after completion of antibiotic, please schedule an appointment with a health care provider or an ENT.  Sinus infections are not as easily transmitted as other respiratory infection, however we still recommend that you avoid close contact with loved ones, especially the very young and elderly.  Remember to wash your hands thoroughly throughout the day as this is the number one way to prevent the spread of infection!  Home Care: Only take medications as instructed by your medical team. Complete the entire course of an antibiotic. Do not take these medications with alcohol. A steam or ultrasonic humidifier can help congestion.  You can place a towel over your head and breathe in the steam from hot water coming from a faucet. Avoid close contacts especially the very young and the elderly. Cover your mouth when you cough or sneeze. Always remember to wash your hands.  Get Help Right Away If: You develop worsening fever or sinus pain. You develop a severe head ache or visual changes. Your symptoms persist after you have completed your treatment plan.  Make sure you Understand these instructions. Will watch your  condition. Will get help right away if you are not doing well or get worse.  Thank you for choosing an e-visit.  Your e-visit answers were reviewed by a board certified advanced clinical practitioner to complete your personal care plan. Depending upon the condition, your plan could have included both over the counter or prescription medications.  Please review your pharmacy choice. Make sure the pharmacy is open so you can pick up prescription now. If there is a problem, you may contact your provider through [employer] and have the prescription routed to another pharmacy.  Your safety is important to [place]. If you have drug allergies check your prescription carefully.   For the next 24 hours you can use MyChart to ask questions about today's visit, request a non-urgent call back, or ask for a work or school excuse. You will get an email in the next two days asking about your experience. I hope that your e-visit has been valuable and will speed your recovery.  Approximately 5 minutes was used in reviewing the patient's chart, questionnaire, prescribing medications, and documentation.

## 2022-07-11 ENCOUNTER — Ambulatory Visit (HOSPITAL_COMMUNITY): Payer: Medicare HMO | Admitting: Licensed Clinical Social Worker

## 2022-07-11 ENCOUNTER — Encounter: Payer: Self-pay | Admitting: Cardiology

## 2022-07-11 ENCOUNTER — Encounter: Payer: Self-pay | Admitting: Plastic Surgery

## 2022-07-12 ENCOUNTER — Encounter: Payer: Self-pay | Admitting: Internal Medicine

## 2022-07-12 ENCOUNTER — Other Ambulatory Visit (HOSPITAL_COMMUNITY): Payer: Self-pay

## 2022-07-12 MED ORDER — METOPROLOL SUCCINATE ER 25 MG PO TB24: 12.5000 mg | ORAL_TABLET | Freq: Every day | ORAL | 1 refills | Status: DC

## 2022-07-12 NOTE — Telephone Encounter (Signed)
Pt aware myself or assist would call her back by the end of the week to go over instructions again. Pt agreeable to plan.

## 2022-07-13 ENCOUNTER — Other Ambulatory Visit (HOSPITAL_COMMUNITY): Payer: Self-pay

## 2022-07-14 ENCOUNTER — Other Ambulatory Visit (HOSPITAL_COMMUNITY): Payer: Self-pay

## 2022-07-14 ENCOUNTER — Other Ambulatory Visit: Payer: Self-pay | Admitting: Cardiology

## 2022-07-14 ENCOUNTER — Telehealth: Payer: Self-pay

## 2022-07-14 MED FILL — Metoprolol Succinate Tab ER 24HR 25 MG (Tartrate Equiv): ORAL | 90 days supply | Qty: 45 | Fill #0 | Status: AC

## 2022-07-14 NOTE — Telephone Encounter (Signed)
Spoke with pt about her upcoming CT/Ablation procedure. She is aware of instructions and date/time.

## 2022-07-18 ENCOUNTER — Other Ambulatory Visit: Payer: Self-pay | Admitting: Internal Medicine

## 2022-07-18 ENCOUNTER — Other Ambulatory Visit (HOSPITAL_COMMUNITY): Payer: Self-pay

## 2022-07-18 ENCOUNTER — Ambulatory Visit: Payer: Medicare HMO | Attending: Cardiology

## 2022-07-18 DIAGNOSIS — I480 Paroxysmal atrial fibrillation: Secondary | ICD-10-CM | POA: Diagnosis not present

## 2022-07-18 DIAGNOSIS — Z01812 Encounter for preprocedural laboratory examination: Secondary | ICD-10-CM | POA: Diagnosis not present

## 2022-07-18 DIAGNOSIS — R3121 Asymptomatic microscopic hematuria: Secondary | ICD-10-CM | POA: Diagnosis not present

## 2022-07-18 DIAGNOSIS — R8271 Bacteriuria: Secondary | ICD-10-CM | POA: Diagnosis not present

## 2022-07-19 ENCOUNTER — Other Ambulatory Visit (HOSPITAL_COMMUNITY): Payer: Self-pay

## 2022-07-19 LAB — CBC
Hematocrit: 35.9 % (ref 34.0–46.6)
Hemoglobin: 12.2 g/dL (ref 11.1–15.9)
MCH: 33 pg (ref 26.6–33.0)
MCHC: 34 g/dL (ref 31.5–35.7)
MCV: 97 fL (ref 79–97)
Platelets: 311 10*3/uL (ref 150–450)
RBC: 3.7 x10E6/uL — ABNORMAL LOW (ref 3.77–5.28)
RDW: 12.6 % (ref 11.7–15.4)
WBC: 5.5 10*3/uL (ref 3.4–10.8)

## 2022-07-19 LAB — BASIC METABOLIC PANEL
BUN/Creatinine Ratio: 17 (ref 12–28)
BUN: 13 mg/dL (ref 8–27)
CO2: 23 mmol/L (ref 20–29)
Calcium: 9.5 mg/dL (ref 8.7–10.3)
Chloride: 96 mmol/L (ref 96–106)
Creatinine, Ser: 0.77 mg/dL (ref 0.57–1.00)
Glucose: 88 mg/dL (ref 70–99)
Potassium: 4.4 mmol/L (ref 3.5–5.2)
Sodium: 133 mmol/L — ABNORMAL LOW (ref 134–144)
eGFR: 84 mL/min/{1.73_m2} (ref >59)

## 2022-07-19 MED ORDER — OMEPRAZOLE 20 MG PO CPDR
20.0000 mg | DELAYED_RELEASE_CAPSULE | Freq: Every morning | ORAL | 0 refills | Status: DC
  Filled 2022-07-19: qty 90, 90d supply, fill #0

## 2022-07-21 DIAGNOSIS — M545 Low back pain, unspecified: Secondary | ICD-10-CM | POA: Diagnosis not present

## 2022-07-24 ENCOUNTER — Telehealth (HOSPITAL_COMMUNITY): Payer: Self-pay | Admitting: *Deleted

## 2022-07-24 NOTE — Telephone Encounter (Signed)
Attempted to call patient regarding upcoming cardiac CT appointment. °Left message on voicemail with name and callback number ° °Antanisha Mohs RN Navigator Cardiac Imaging °[facility] °[phone] Office °[phone] Cell ° °

## 2022-07-24 NOTE — Telephone Encounter (Signed)
Patient returning call regarding upcoming cardiac imaging study; pt verbalizes understanding of appt date/time, parking situation and where to check in, and verified current allergies; name and call back number provided for further questions should they arise  Safal Halderman RN Navigator Cardiac Imaging [facility] [phone] office [phone] cell  

## 2022-07-25 ENCOUNTER — Ambulatory Visit (HOSPITAL_BASED_OUTPATIENT_CLINIC_OR_DEPARTMENT_OTHER)
Admission: RE | Admit: 2022-07-25 | Discharge: 2022-07-25 | Disposition: A | Discharge status: Home / Self Care | Payer: Medicare HMO | Source: Ambulatory Visit | Attending: Cardiology | Admitting: Cardiology

## 2022-07-25 ENCOUNTER — Encounter (HOSPITAL_BASED_OUTPATIENT_CLINIC_OR_DEPARTMENT_OTHER): Payer: Self-pay

## 2022-07-25 DIAGNOSIS — I480 Paroxysmal atrial fibrillation: Secondary | ICD-10-CM | POA: Insufficient documentation

## 2022-07-25 MED ORDER — IOHEXOL 350 MG/ML SOLN
100.0000 mL | Freq: Once | INTRAVENOUS | Status: AC | PRN
  Administered 2022-07-25: 80 mL via INTRAVENOUS

## 2022-07-27 ENCOUNTER — Ambulatory Visit (HOSPITAL_COMMUNITY): Payer: Medicare HMO | Admitting: Licensed Clinical Social Worker

## 2022-07-31 NOTE — Pre-Procedure Instructions (Signed)
Instructed patient on the following items: Arrival time 1100 Nothing to eat or drink after midnight No meds AM of procedure Responsible person to drive you home and stay with you for 24 hrs  Have you missed any doses of anti-coagulant Elqiuis- hasn't missed doses

## 2022-08-01 ENCOUNTER — Other Ambulatory Visit: Payer: Self-pay

## 2022-08-01 ENCOUNTER — Encounter (HOSPITAL_COMMUNITY)
Admission: RE | Disposition: A | Discharge status: Home / Self Care | Payer: Medicare HMO | Source: Home / Self Care | Attending: Cardiology

## 2022-08-01 ENCOUNTER — Ambulatory Visit (HOSPITAL_BASED_OUTPATIENT_CLINIC_OR_DEPARTMENT_OTHER): Payer: Medicare HMO | Admitting: Anesthesiology

## 2022-08-01 ENCOUNTER — Ambulatory Visit (HOSPITAL_COMMUNITY)
Admission: RE | Admit: 2022-08-01 | Discharge: 2022-08-01 | Disposition: A | Discharge status: Home / Self Care | Payer: Medicare HMO | Attending: Cardiology | Admitting: Cardiology

## 2022-08-01 ENCOUNTER — Ambulatory Visit (HOSPITAL_COMMUNITY): Payer: Medicare HMO | Admitting: Anesthesiology

## 2022-08-01 DIAGNOSIS — I10 Essential (primary) hypertension: Secondary | ICD-10-CM | POA: Insufficient documentation

## 2022-08-01 DIAGNOSIS — Z79899 Other long term (current) drug therapy: Secondary | ICD-10-CM | POA: Insufficient documentation

## 2022-08-01 DIAGNOSIS — Z87891 Personal history of nicotine dependence: Secondary | ICD-10-CM | POA: Insufficient documentation

## 2022-08-01 DIAGNOSIS — Z7901 Long term (current) use of anticoagulants: Secondary | ICD-10-CM | POA: Diagnosis not present

## 2022-08-01 DIAGNOSIS — I4891 Unspecified atrial fibrillation: Secondary | ICD-10-CM

## 2022-08-01 DIAGNOSIS — R69 Illness, unspecified: Secondary | ICD-10-CM | POA: Diagnosis not present

## 2022-08-01 DIAGNOSIS — F418 Other specified anxiety disorders: Secondary | ICD-10-CM

## 2022-08-01 DIAGNOSIS — I480 Paroxysmal atrial fibrillation: Secondary | ICD-10-CM | POA: Insufficient documentation

## 2022-08-01 HISTORY — PX: ATRIAL FIBRILLATION ABLATION: EP1191

## 2022-08-01 LAB — POCT ACTIVATED CLOTTING TIME
Activated Clotting Time: 323 seconds
Activated Clotting Time: 347 seconds

## 2022-08-01 SURGERY — ATRIAL FIBRILLATION ABLATION
Anesthesia: General

## 2022-08-01 MED ORDER — PROPOFOL 10 MG/ML IV BOLUS
INTRAVENOUS | Status: DC | PRN
  Administered 2022-08-01: 170 mg via INTRAVENOUS
  Administered 2022-08-01: 30 mg via INTRAVENOUS

## 2022-08-01 MED ORDER — HEPARIN SODIUM (PORCINE) 1000 UNIT/ML IJ SOLN
INTRAMUSCULAR | Status: DC | PRN
  Administered 2022-08-01: 14000 [IU] via INTRAVENOUS
  Administered 2022-08-01: 2000 [IU] via INTRAVENOUS

## 2022-08-01 MED ORDER — HEPARIN SODIUM (PORCINE) 1000 UNIT/ML IJ SOLN
INTRAMUSCULAR | Status: AC
  Filled 2022-08-01: qty 10

## 2022-08-01 MED ORDER — PHENYLEPHRINE HCL-NACL 20-0.9 MG/250ML-% IV SOLN
INTRAVENOUS | Status: DC | PRN
  Administered 2022-08-01: 50 ug/min via INTRAVENOUS

## 2022-08-01 MED ORDER — EPHEDRINE SULFATE-NACL 50-0.9 MG/10ML-% IV SOSY
PREFILLED_SYRINGE | INTRAVENOUS | Status: DC | PRN
  Administered 2022-08-01: 5 mg via INTRAVENOUS

## 2022-08-01 MED ORDER — ACETAMINOPHEN 325 MG PO TABS
650.0000 mg | ORAL_TABLET | ORAL | Status: DC | PRN
  Administered 2022-08-01: 650 mg via ORAL

## 2022-08-01 MED ORDER — GLYCOPYRROLATE 0.2 MG/ML IJ SOLN
INTRAMUSCULAR | Status: DC | PRN
  Administered 2022-08-01: .2 mg via INTRAVENOUS

## 2022-08-01 MED ORDER — DEXAMETHASONE SODIUM PHOSPHATE 10 MG/ML IJ SOLN
INTRAMUSCULAR | Status: DC | PRN
  Administered 2022-08-01: 8 mg via INTRAVENOUS

## 2022-08-01 MED ORDER — ONDANSETRON HCL 4 MG/2ML IJ SOLN
INTRAMUSCULAR | Status: DC | PRN
  Administered 2022-08-01: 4 mg via INTRAVENOUS

## 2022-08-01 MED ORDER — DOBUTAMINE INFUSION FOR EP/ECHO/NUC (1000 MCG/ML)
INTRAVENOUS | Status: DC | PRN
  Administered 2022-08-01: 20 ug/kg/min via INTRAVENOUS

## 2022-08-01 MED ORDER — PHENYLEPHRINE 80 MCG/ML (10ML) SYRINGE FOR IV PUSH (FOR BLOOD PRESSURE SUPPORT)
PREFILLED_SYRINGE | INTRAVENOUS | Status: DC | PRN
  Administered 2022-08-01: 80 ug via INTRAVENOUS

## 2022-08-01 MED ORDER — SUGAMMADEX SODIUM 200 MG/2ML IV SOLN
INTRAVENOUS | Status: DC | PRN
  Administered 2022-08-01: 200 mg via INTRAVENOUS

## 2022-08-01 MED ORDER — FENTANYL CITRATE (PF) 100 MCG/2ML IJ SOLN
INTRAMUSCULAR | Status: DC | PRN
  Administered 2022-08-01 (×4): 50 ug via INTRAVENOUS

## 2022-08-01 MED ORDER — HEPARIN SODIUM (PORCINE) 1000 UNIT/ML IJ SOLN
INTRAMUSCULAR | Status: DC | PRN
  Administered 2022-08-01: 1000 [IU] via INTRAVENOUS

## 2022-08-01 MED ORDER — SODIUM CHLORIDE 0.9 % IV SOLN: INTRAVENOUS | Status: DC

## 2022-08-01 MED ORDER — HEPARIN (PORCINE) IN NACL 1000-0.9 UT/500ML-% IV SOLN
INTRAVENOUS | Status: AC
  Filled 2022-08-01: qty 500

## 2022-08-01 MED ORDER — MIDAZOLAM HCL 2 MG/2ML IJ SOLN
INTRAMUSCULAR | Status: DC | PRN
  Administered 2022-08-01 (×2): 1 mg via INTRAVENOUS

## 2022-08-01 MED ORDER — DOBUTAMINE INFUSION FOR EP/ECHO/NUC (1000 MCG/ML)
INTRAVENOUS | Status: AC
  Filled 2022-08-01: qty 250

## 2022-08-01 MED ORDER — HEPARIN (PORCINE) IN NACL 1000-0.9 UT/500ML-% IV SOLN
INTRAVENOUS | Status: DC | PRN
  Administered 2022-08-01 (×4): 500 mL

## 2022-08-01 MED ORDER — LIDOCAINE 2% (20 MG/ML) 5 ML SYRINGE
INTRAMUSCULAR | Status: DC | PRN
  Administered 2022-08-01: 60 mg via INTRAVENOUS

## 2022-08-01 MED ORDER — PROTAMINE SULFATE 10 MG/ML IV SOLN
INTRAVENOUS | Status: DC | PRN
  Administered 2022-08-01: 30 mg via INTRAVENOUS
  Administered 2022-08-01: 10 mg via INTRAVENOUS

## 2022-08-01 MED ORDER — SODIUM CHLORIDE 0.9 % IV SOLN: 250.0000 mL | INTRAVENOUS | Status: DC | PRN

## 2022-08-01 MED ORDER — ONDANSETRON HCL 4 MG/2ML IJ SOLN: 4.0000 mg | Freq: Four times a day (QID) | INTRAMUSCULAR | Status: DC | PRN

## 2022-08-01 MED ORDER — SODIUM CHLORIDE 0.9% FLUSH: 3.0000 mL | Freq: Two times a day (BID) | INTRAVENOUS | Status: DC

## 2022-08-01 MED ORDER — ROCURONIUM BROMIDE 10 MG/ML (PF) SYRINGE
PREFILLED_SYRINGE | INTRAVENOUS | Status: DC | PRN
  Administered 2022-08-01: 40 mg via INTRAVENOUS
  Administered 2022-08-01: 60 mg via INTRAVENOUS

## 2022-08-01 MED ORDER — ACETAMINOPHEN 325 MG PO TABS
ORAL_TABLET | ORAL | Status: AC
  Filled 2022-08-01: qty 2

## 2022-08-01 MED ORDER — SODIUM CHLORIDE 0.9% FLUSH: 3.0000 mL | INTRAVENOUS | Status: DC | PRN

## 2022-08-01 SURGICAL SUPPLY — 18 items
CATH 8FR REPROCESSED SOUNDSTAR (CATHETERS) ×1 IMPLANT
CATH 8FR SOUNDSTAR REPROCESSED (CATHETERS) IMPLANT
CATH ABLAT QDOT MICRO BI TC DF (CATHETERS) IMPLANT
CATH OCTARAY 2.0 F 3-3-3-3-3 (CATHETERS) IMPLANT
CATH PIGTAIL STEERABLE D1 8.7 (WIRE) IMPLANT
CATH S-M CIRCA TEMP PROBE (CATHETERS) IMPLANT
CATH WEBSTER BI DIR CS D-F CRV (CATHETERS) IMPLANT
CLOSURE PERCLOSE PROSTYLE (VASCULAR PRODUCTS) IMPLANT
COVER SWIFTLINK CONNECTOR (BAG) ×1 IMPLANT
PACK EP LATEX FREE (CUSTOM PROCEDURE TRAY) ×1
PACK EP LF (CUSTOM PROCEDURE TRAY) ×1 IMPLANT
PAD DEFIB RADIO PHYSIO CONN (PAD) ×1 IMPLANT
SHEATH CARTO VIZIGO SM CVD (SHEATH) IMPLANT
SHEATH PINNACLE 7F 10CM (SHEATH) IMPLANT
SHEATH PINNACLE 8F 10CM (SHEATH) IMPLANT
SHEATH PINNACLE 9F 10CM (SHEATH) IMPLANT
SHEATH PROBE COVER 6X72 (BAG) IMPLANT
TUBING SMART ABLATE COOLFLOW (TUBING) IMPLANT

## 2022-08-01 NOTE — Transfer of Care (Signed)
Immediate Anesthesia Transfer of Care Note  Patient: Kara Warren  Procedure(s) Performed: ATRIAL FIBRILLATION ABLATION  Patient Location: PACU  Anesthesia Type:General  Level of Consciousness: awake, alert , and oriented  Airway & Oxygen Therapy: Patient Spontanous Breathing  Post-op Assessment: Report given to RN and Post -op Vital signs reviewed and stable  Post vital signs: Reviewed and stable  Last Vitals:  Vitals Value Taken Time  BP 126/81 08/01/22 1448  Temp    Pulse 73 08/01/22 1451  Resp 15 08/01/22 1451  SpO2 98 % 08/01/22 1451  Vitals shown include unvalidated device data.  Last Pain:  Vitals:   08/01/22 1125  PainSc: 0-No pain         Complications: There were no known notable events for this encounter.

## 2022-08-01 NOTE — Anesthesia Preprocedure Evaluation (Signed)
Anesthesia Evaluation  Patient identified by MRN, date of birth, ID band Patient awake    Reviewed: Allergy & Precautions, NPO status , Patient's Chart, lab work & pertinent test results  Airway Mallampati: II  TM Distance: >3 FB Neck ROM: Full    Dental no notable dental hx.    Pulmonary neg pulmonary ROS, former smoker,    Pulmonary exam normal        Cardiovascular hypertension, Pt. on medications and Pt. on home beta blockers + dysrhythmias Atrial Fibrillation  Rhythm:Irregular Rate:Normal  ECHO 2023: 1. Left ventricular ejection fraction, by estimation, is 60 to 65%. The  left ventricle has normal function. The left ventricle has no regional  wall motion abnormalities. Left ventricular diastolic parameters were  normal.  2. Right ventricular systolic function is normal. The right ventricular  size is normal. There is normal pulmonary artery systolic pressure.  3. Left atrial size was moderately to severely dilated.  4. Right atrial size was mildly dilated.  5. The mitral valve is normal in structure. Trivial mitral valve  regurgitation. No evidence of mitral stenosis.  6. The aortic valve is tricuspid. Aortic valve regurgitation is not  visualized. No aortic stenosis is present.  7. The inferior vena cava is normal in size with greater than 50%  respiratory variability, suggesting right atrial pressure of 3 mmHg.   Comparison(s): Changes from prior study are noted. LA more dilated    Neuro/Psych  Headaches, Anxiety Depression    GI/Hepatic Neg liver ROS, hiatal hernia, GERD  Medicated,  Endo/Other  negative endocrine ROS  Renal/GU negative Renal ROS  negative genitourinary   Musculoskeletal negative musculoskeletal ROS (+)   Abdominal Normal abdominal exam  (+)   Peds  Hematology negative hematology ROS (+)   Anesthesia Other Findings   Reproductive/Obstetrics                              Anesthesia Physical Anesthesia Plan  ASA: 3  Anesthesia Plan: General   Post-op Pain Management:    Induction: Intravenous  PONV Risk Score and Plan: 3 and Ondansetron, Dexamethasone and Treatment may vary due to age or medical condition  Airway Management Planned: Mask and Oral ETT  Additional Equipment: None  Intra-op Plan:   Post-operative Plan: Extubation in OR  Informed Consent: I have reviewed the patients History and Physical, chart, labs and discussed the procedure including the risks, benefits and alternatives for the proposed anesthesia with the patient or authorized representative who has indicated his/her understanding and acceptance.     Dental advisory given  Plan Discussed with: CRNA  Anesthesia Plan Comments: (Lab Results      Component                Value               Date                      WBC                      5.5                 07/18/2022                HGB                      12.2  07/18/2022                HCT                      35.9                07/18/2022                MCV                      97                  07/18/2022                PLT                      311                 07/18/2022           Lab Results      Component                Value               Date                      NA                       133 (L)             07/18/2022                K                        4.4                 07/18/2022                CO2                      23                  07/18/2022                GLUCOSE                  88                  07/18/2022                BUN                      13                  07/18/2022                CREATININE               0.77                07/18/2022                CALCIUM                  9.5                 07/18/2022  EGFR                     84                  07/18/2022                GFRNONAA                 >60                 03/29/2022           )        Anesthesia Quick Evaluation

## 2022-08-01 NOTE — Anesthesia Procedure Notes (Signed)
Procedure Name: Intubation Date/Time: 08/01/2022 12:58 PM  Performed by: Mariea Clonts, CRNAPre-anesthesia Checklist: Patient identified, Emergency Drugs available, Suction available and Patient being monitored Patient Re-evaluated:Patient Re-evaluated prior to induction Oxygen Delivery Method: Circle System Utilized Preoxygenation: Pre-oxygenation with 100% oxygen Induction Type: IV induction Ventilation: Mask ventilation without difficulty Laryngoscope Size: Miller and 2 Grade View: Grade III Tube type: Oral Tube size: 7.0 mm Number of attempts: 2 Airway Equipment and Method: Stylet and Oral airway Placement Confirmation: ETT inserted through vocal cords under direct vision, positive ETCO2 and breath sounds checked- equal and bilateral Tube secured with: Tape Dental Injury: Teeth and Oropharynx as per pre-operative assessment

## 2022-08-01 NOTE — Discharge Instructions (Signed)

## 2022-08-01 NOTE — H&P (Signed)
Electrophysiology Office Note   Date:  08/01/2022   ID:  Kara Warren, DOB [birth year], MRN [id number]  PCP:  Isaac Bliss, Rayford Halsted, MD  Cardiologist:  Nahser Primary Electrophysiologist:  Wilbert Hayashi Meredith Leeds, MD    Chief Complaint: AF   History of Present Illness: Kara Warren is a 66 y.o. female who is being seen today for the evaluation of AF at the request of No ref. provider found. Presenting today for electrophysiology evaluation.  She has a history significant for atypical chest pain, atrial fibrillation, hypertension.  She was diagnosed with atrial fibrillation 2 years ago.  She is currently on flecainide and diltiazem as well as Eliquis.  At the end of June, she had more frequent episodes of atrial fibrillation.  She felt weak, fatigued, with palpitations and shortness of breath.  She converted to sinus rhythm, but did go to the emergency room a few times.  Atrial fibrillation is confirmed via her Apple watch.  She is in normal rhythm today.  She was prescribed flecainide, but she has been taking it on a daily basis.  Today, denies symptoms of palpitations, chest pain, shortness of breath, orthopnea, PND, lower extremity edema, claudication, dizziness, presyncope, syncope, bleeding, or neurologic sequela. The patient is tolerating medications without difficulties. Plan ablation today.    Past Medical History:  Diagnosis Date   Abdominal pain, left upper quadrant    Acute bronchospasm    Acute neck pain 07/02/2019   Acute sinusitis, unspecified 05/20/2009   Centricity Description: SINUSITIS - ACUTE-NOS Qualifier: Diagnosis of  By: Birdie Riddle MD, Belenda Cruise   Centricity Description: ACUTE SINUSITIS, UNSPECIFIED Qualifier: Diagnosis of  By: Alveta Heimlich MD, Eugene   Centricity Description: SINUSITIS, ACUTE Qualifier: Diagnosis of  By: Koleen Nimrod MD, Dellis Filbert     Anxiety    Atypical chest pain 03/05/2013   Body mass index (BMI) of 23.0-23.9 in adult    Cervicalgia    Colon  polyp    Costochondral junction syndrome    Cough    DEPRESSION 08/25/2008   Qualifier: Diagnosis of  By: Ronnald Ramp CMA, Chemira     Diarrhea    Diverticulosis 07/27/2012   Dorsalgia    Dysrhythmia    hx a-fib   Elevated blood pressure reading without diagnosis of hypertension    Endometrial polyp 05/2007   benign   Essential hypertension 09/20/2020   Family history of osteoporosis 12/28/2011   Mother pt had normal Dexa 11/2009    Fatigue    GERD (gastroesophageal reflux disease)    HIP PAIN, RIGHT 04/12/2009   Qualifier: Diagnosis of  By: Oneida Alar MD, KARL     History of hiatal hernia    HYPERLIPIDEMIA 08/25/2008   Qualifier: Diagnosis of  By: Birdie Riddle MD, Belenda Cruise     IBS (irritable bowel syndrome)    Left hip pain 07/30/2013   Left wrist pain 07/30/2013   Lumbar strain, initial encounter 07/28/2019   LUNG NODULE 11/19/2009   Neg CT     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 Please review for accuracy.   Otalgia, unspecified ear    Pain in throat    Palpitations    Paroxysmal atrial fibrillation (HCC)    Peroneal tendinitis of right lower extremity 03/12/2014   Plantar wart 04/02/2014   Pneumonia    RHINITIS 06/21/2009   Qualifier: Diagnosis of  By: Birdie Riddle MD, Belenda Cruise     Right ankle injury, subsequent encounter 01/11/2018   Right foot pain 08/09/2011   Sinusitis, chronic    Skene's gland abscess 2009   patient unaware   Sprain of unspecified ligament of right ankle, subsequent encounter     TRANSAMINASES, SERUM, ELEVATED 12/07/2009   Qualifier: Diagnosis of  By: Inda Castle FNP, Melissa S    Unspecified cataract    Urinary tract infection, site not specified    Vitamin D deficiency    Past Surgical History:  Procedure Laterality Date   BROW LIFT Bilateral 12/29/2021   Procedure: BLEPHAROPLASTY;  Surgeon: Lennice Sites, MD;  Location: [facility];  Service: Plastics;  Laterality: Bilateral;   COLONOSCOPY     x several   DILATION AND CURETTAGE OF UTERUS  05/2007   GYNECOLOGIC CRYOSURGERY  1980   HYSTEROSCOPY WITH D & C N/A 03/30/2017   Procedure: DILATATION & CURETTAGE/HYSTEROSCOPY WITH ULTRASOUND GUIDANCE;  Surgeon: Megan Salon, MD;  Location: [facility];  Service: Gynecology;  Laterality: N/A;   UPPER GI ENDOSCOPY     uterine ablation  2007     Current Facility-Administered Medications  Medication Dose Route Frequency Provider Last Rate Last Admin   0.9 %  sodium chloride infusion   Intravenous Continuous Constance Haw, MD 50 mL/hr at 08/01/22 1129 New Bag at 08/01/22 1129    Allergies:   Augmentin [amoxicillin-pot clavulanate]   Social History:  The patient  reports that she quit smoking about 43 years ago. Her smoking use included cigarettes. She has a 0.30 pack-year smoking history. She has never used smokeless tobacco. She reports current alcohol use of about 7.0 standard drinks of alcohol per week. She reports that she does not use drugs.   Family History:  The patient's family history includes Breast cancer in her paternal grandmother; CAD in her father; Colitis in her father; Colon cancer in her maternal grandfather; Colon polyps in her father and mother; Diverticulosis in her mother; Glaucoma in her father and mother; Hypertension in her father; Osteoporosis in her mother; Thyroid disease in her mother; Ulcerative colitis in her father.   ROS:  Please see the history of present illness.   Otherwise, review of systems is positive for none.   All other systems are  reviewed and negative.   PHYSICAL EXAM: VS:  BP 121/73   Pulse (!) 49   Temp (!) 97.5 F (36.4 C)   Resp 17   Ht '5\' 4"'$  (1.626 m)   Wt 63.5 kg   LMP 10/02/2006 (Approximate)   SpO2 99%   BMI 24.03 kg/m  , BMI Body mass index is 24.03 kg/m. GEN: Well nourished, well developed, in no acute distress  HEENT: normal  Neck: no JVD, carotid bruits, or masses Cardiac: RRR; no murmurs, rubs, or gallops,no edema  Respiratory:  clear to auscultation bilaterally, normal work of breathing GI: soft, nontender, nondistended, + BS MS: no deformity or atrophy  Skin: warm and dry Neuro:  Strength and sensation are intact Psych: euthymic mood, full affect   Recent Labs: 03/29/2022: ALT 46; Magnesium 2.1; TSH 0.539 07/18/2022: BUN 13; Creatinine, Ser 0.77;  Hemoglobin 12.2; Platelets 311; Potassium 4.4; Sodium 133    Lipid Panel     Component Value Date/Time   CHOL 210 (H) 10/10/2021 0923   TRIG 64.0 10/10/2021 0923   HDL 87.70 10/10/2021 0923   CHOLHDL 2 10/10/2021 0923   VLDL 12.8 10/10/2021 0923   LDLCALC 109 (H) 10/10/2021 0923     Wt Readings from Last 3 Encounters:  08/01/22 63.5 kg  06/27/22 66.2 kg  06/23/22 65.8 kg      Other studies Reviewed: Additional studies/ records that were reviewed today include: TTE 04/06/22  Review of the above records today demonstrates:   1. Left ventricular ejection fraction, by estimation, is 60 to 65%. The  left ventricle has normal function. The left ventricle has no regional  wall motion abnormalities. Left ventricular diastolic parameters were  normal.   2. Right ventricular systolic function is normal. The right ventricular  size is normal. There is normal pulmonary artery systolic pressure.   3. Left atrial size was moderately to severely dilated.   4. Right atrial size was mildly dilated.   5. The mitral valve is normal in structure. Trivial mitral valve  regurgitation. No evidence of mitral stenosis.   6. The aortic valve is  tricuspid. Aortic valve regurgitation is not  visualized. No aortic stenosis is present.   7. The inferior vena cava is normal in size with greater than 50%  respiratory variability, suggesting right atrial pressure of 3 mmHg.    ASSESSMENT AND PLAN:  1.  Paroxysmal atrial fibrillation: Kara Warren has presented today for surgery, with the diagnosis of AF.  The various methods of treatment have been discussed with the patient and family. After consideration of risks, benefits and other options for treatment, the patient has consented to  Procedure(s): Catheter ablation as a surgical intervention .  Risks include but not limited to complete heart block, stroke, esophageal damage, nerve damage, bleeding, vascular damage, tamponade, perforation, MI, and death. The patient's history has been reviewed, patient examined, no change in status, stable for surgery.  I have reviewed the patient's chart and labs.  Questions were answered to the patient's satisfaction.    Lisandro Meggett Curt Bears, MD 08/01/2022 11:58 AM

## 2022-08-02 ENCOUNTER — Encounter (HOSPITAL_COMMUNITY): Payer: Self-pay | Admitting: Cardiology

## 2022-08-02 NOTE — Anesthesia Postprocedure Evaluation (Signed)
Anesthesia Post Note  Patient: Kara Warren  Procedure(s) Performed: ATRIAL FIBRILLATION ABLATION     Patient location during evaluation: PACU Anesthesia Type: General Level of consciousness: awake Pain management: pain level controlled Vital Signs Assessment: post-procedure vital signs reviewed and stable Respiratory status: spontaneous breathing, nonlabored ventilation, respiratory function stable and patient connected to nasal cannula oxygen Cardiovascular status: blood pressure returned to baseline and stable Postop Assessment: no apparent nausea or vomiting Anesthetic complications: no   There were no known notable events for this encounter.  Last Vitals:  Vitals:   08/01/22 1730 08/01/22 1800  BP: 119/84 125/83  Pulse: 65 74  Resp: 17 17  Temp:    SpO2: 98% 98%    Last Pain:  Vitals:   08/01/22 1602  TempSrc:   PainSc: 6                  Tiauna Whisnant P Jalacia Mattila

## 2022-08-03 ENCOUNTER — Encounter: Payer: Self-pay | Admitting: Cardiology

## 2022-08-08 NOTE — Telephone Encounter (Signed)
Left message to call back  

## 2022-08-10 ENCOUNTER — Ambulatory Visit (INDEPENDENT_AMBULATORY_CARE_PROVIDER_SITE_OTHER): Payer: Medicare HMO | Admitting: Licensed Clinical Social Worker

## 2022-08-10 DIAGNOSIS — R69 Illness, unspecified: Secondary | ICD-10-CM | POA: Diagnosis not present

## 2022-08-10 DIAGNOSIS — F4321 Adjustment disorder with depressed mood: Secondary | ICD-10-CM | POA: Diagnosis not present

## 2022-08-10 NOTE — Telephone Encounter (Signed)
Left detailed message informing pt of Dr. Curt Bears recommendation. Advised to let me know next week if she held medications and if so has she seen improvement in fatigue and HRs.

## 2022-08-10 NOTE — Progress Notes (Signed)
Virtual Visit via Video Note  I connected with Wilma Flavin on 08/10/22 at 11:00 AM EST by a video enabled telemedicine application and verified that I am speaking with the correct person using two identifiers.  Location: Patient: home Provider: home office   I discussed the limitations of evaluation and management by telemedicine and the availability of in person appointments. The patient expressed understanding and agreed to proceed.   I discussed the assessment and treatment plan with the patient. The patient was provided an opportunity to ask questions and all were answered. The patient agreed with the plan and demonstrated an understanding of the instructions.   The patient was advised to call back or seek an in-person evaluation if the symptoms worsen or if the condition fails to improve as anticipated.  I provided 55 minutes of non-face-to-face time during this encounter.   Mindi Curling, LCSW   THERAPIST PROGRESS NOTE  Session Time: 11:00am-11:55am  Participation Level: Active  Behavioral Response: Well GroomedAlertEuthymic  Type of Therapy: Individual Therapy  Treatment Goals addressed: Jakera will identify patterns in relationships that are related to her level of self esteem and make choices more in line with higher levels of self esteem and self-compassion   ProgressTowards Goals: Progressing  Interventions: CBT  Summary: TREENA COSMAN is a 66 y.o. female who presents with Adjustment Disorder with depressed mood.   Suicidal/Homicidal: Nowithout intent/plan  Therapist Response: Faryal engaged well in individual session with clinician. Clinician utilized CBT to process updates in health and recent procedure on heart. Clinician explored ups and downs in mood, as well as interactions with friends and family. Clinician discussed improvement in relationship with partner, as he was very attentive and caring while she recovered. Clinician processed changes in  expectations of partner, noting that she will need to improve her own communication skills. Dene shared that she did tell her mother about her heart procedure before it happened. Clinician identified that this was therapeutic for both Yamila and mother.   Plan: Return again in 2 weeks.  Diagnosis: Adjustment disorder with depressed mood  Collaboration of Care: Other none required  Patient/Guardian was advised Release of Information must be obtained prior to any record release in order to collaborate their care with an outside provider. Patient/Guardian was advised if they have not already done so to contact the registration department to sign all necessary forms in order for [place] to release information regarding their care.   Consent: Patient/Guardian gives verbal consent for treatment and assignment of benefits for services provided during this visit. Patient/Guardian expressed understanding and agreed to proceed.   Jobe Marker [place], LCSW 08/10/2022

## 2022-08-14 ENCOUNTER — Encounter (HOSPITAL_COMMUNITY): Payer: Self-pay | Admitting: Licensed Clinical Social Worker

## 2022-08-14 DIAGNOSIS — M545 Low back pain, unspecified: Secondary | ICD-10-CM | POA: Diagnosis not present

## 2022-08-17 ENCOUNTER — Other Ambulatory Visit (HOSPITAL_COMMUNITY): Payer: Self-pay

## 2022-08-22 ENCOUNTER — Ambulatory Visit (HOSPITAL_COMMUNITY): Payer: Medicare HMO | Admitting: Licensed Clinical Social Worker

## 2022-08-29 ENCOUNTER — Ambulatory Visit (HOSPITAL_COMMUNITY)
Admission: RE | Admit: 2022-08-29 | Discharge: 2022-08-29 | Disposition: A | Discharge status: Home / Self Care | Payer: Medicare HMO | Source: Ambulatory Visit | Attending: Nurse Practitioner | Admitting: Nurse Practitioner

## 2022-08-29 ENCOUNTER — Encounter (HOSPITAL_COMMUNITY): Payer: Self-pay | Admitting: Nurse Practitioner

## 2022-08-29 VITALS — BP 124/74 | HR 49 | Ht 64.0 in | Wt 145.6 lb

## 2022-08-29 DIAGNOSIS — D6869 Other thrombophilia: Secondary | ICD-10-CM

## 2022-08-29 DIAGNOSIS — I10 Essential (primary) hypertension: Secondary | ICD-10-CM | POA: Diagnosis not present

## 2022-08-29 DIAGNOSIS — I480 Paroxysmal atrial fibrillation: Secondary | ICD-10-CM

## 2022-08-29 NOTE — Progress Notes (Signed)
Primary Care Physician: Isaac Bliss, Rayford Halsted, MD Referring Physician: Dr. Teressa Lower is a 66 y.o. female with a h/o of afib that is here today for one month f/u pf her afib ablation. She has not noted any afib. No swallowing or groin issues. Is back to her usual activities. HR's now and in SR prior to ablation usually in the mid 50's and will sometimes see upper 40's. She occasionally will have a lightheaded episode mid afternoon but HR's not different than her usual. Overall, she feels well.   Today, she denies symptoms of palpitations, chest pain, shortness of breath, orthopnea, PND, lower extremity edema, dizziness, presyncope, syncope, or neurologic sequela. The patient is tolerating medications without difficulties and is otherwise without complaint today.   Past Medical History:  Diagnosis Date   Abdominal pain, left upper quadrant    Acute bronchospasm    Acute neck pain 07/02/2019   Acute sinusitis, unspecified 05/20/2009   Centricity Description: SINUSITIS - ACUTE-NOS Qualifier: Diagnosis of  By: Birdie Riddle MD, Belenda Cruise   Centricity Description: ACUTE SINUSITIS, UNSPECIFIED Qualifier: Diagnosis of  By: Alveta Heimlich MD, Eugene   Centricity Description: SINUSITIS, ACUTE Qualifier: Diagnosis of  By: Koleen Nimrod MD, Dellis Filbert     Anxiety    Atypical chest pain 03/05/2013   Body mass index (BMI) of 23.0-23.9 in adult    Cervicalgia    Colon polyp    Costochondral junction syndrome    Cough    DEPRESSION 08/25/2008   Qualifier: Diagnosis of  By: Ronnald Ramp CMA, Chemira     Diarrhea    Diverticulosis 07/27/2012   Dorsalgia    Dysrhythmia    hx a-fib   Elevated blood pressure reading without diagnosis of hypertension    Endometrial polyp 05/2007   benign   Essential hypertension 09/20/2020   Family history of osteoporosis 12/28/2011   Mother pt had normal Dexa 11/2009    Fatigue    GERD (gastroesophageal reflux disease)    HIP PAIN, RIGHT 04/12/2009   Qualifier:  Diagnosis of  By: Oneida Alar MD, KARL     History of hiatal hernia    HYPERLIPIDEMIA 08/25/2008   Qualifier: Diagnosis of  By: Birdie Riddle MD, Belenda Cruise     IBS (irritable bowel syndrome)    Left hip pain 07/30/2013   Left wrist pain 07/30/2013   Lumbar strain, initial encounter 07/28/2019   LUNG NODULE 11/19/2009   Neg CT     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 Please review for accuracy.   Otalgia, unspecified ear    Pain in throat    Palpitations    Paroxysmal atrial fibrillation (HCC)    Peroneal tendinitis of right lower extremity 03/12/2014   Plantar wart 04/02/2014   Pneumonia    RHINITIS 06/21/2009   Qualifier: Diagnosis of  By: Birdie Riddle MD, Belenda Cruise     Right ankle injury, subsequent encounter 01/11/2018   Right foot pain 08/09/2011   Sinusitis, chronic    Skene's  gland abscess 2009   patient unaware   Sprain of unspecified ligament of right ankle, subsequent encounter    TRANSAMINASES, SERUM, ELEVATED 12/07/2009   Qualifier: Diagnosis of  By: Inda Castle FNP, Melissa S    Unspecified cataract    Urinary tract infection, site not specified    Vitamin D deficiency    Past Surgical History:  Procedure Laterality Date   ATRIAL FIBRILLATION ABLATION N/A 08/01/2022   Procedure: ATRIAL FIBRILLATION ABLATION;  Surgeon: Constance Haw, MD;  Location: [facility];  Service: Cardiovascular;  Laterality: N/A;   BROW LIFT Bilateral 12/29/2021   Procedure: BLEPHAROPLASTY;  Surgeon: Lennice Sites, MD;  Location: [facility];  Service: Plastics;  Laterality: Bilateral;   COLONOSCOPY     x several   DILATION AND CURETTAGE OF UTERUS  05/2007   GYNECOLOGIC CRYOSURGERY  1980   HYSTEROSCOPY WITH D & C N/A 03/30/2017   Procedure: DILATATION & CURETTAGE/HYSTEROSCOPY WITH ULTRASOUND GUIDANCE;  Surgeon: Megan Salon, MD;  Location: [facility];  Service: Gynecology;  Laterality: N/A;   UPPER GI ENDOSCOPY     uterine ablation  2007    Current Outpatient Medications  Medication Sig Dispense Refill   apixaban (ELIQUIS) 5 MG TABS tablet Take 1 tablet (5 mg total) by mouth 2 (two) times daily. 60 tablet 11   Boswellia Serrata (BOSWELLIA PO) Take 500 mg by mouth daily.     Boswellia-Glucosamine-Vit D (OSTEO BI-FLEX ONE PER DAY PO) Take 1 capsule by mouth daily.     Calcium Carbonate-Vitamin D (CALTRATE 600+D PO) Take 1 tablet by mouth daily.     citalopram (CELEXA) 20 MG tablet Take 1.5 tablets (30 mg total) by mouth daily. (Patient taking differently: Take 20 mg by mouth daily.) 145 tablet 1   cyanocobalamin (VITAMIN B12) 1000 MCG tablet Take 1,000 mcg by mouth daily.     diltiazem (CARDIZEM) 30 MG tablet Take 1 tablet (30 mg total) by mouth 2 (two) times daily if needed for AFIB 45 tablet 1   flecainide (TAMBOCOR) 50 MG tablet Take 1 tablet (50 mg total) by mouth 2 (two) times daily. 180 tablet 3   fluticasone (FLONASE) 50 MCG/ACT nasal spray Place 1-2 sprays into both nostrils daily. 16 g 3   guaiFENesin (MUCINEX) 600 MG 12 hr tablet Take 1 tablet (600 mg total) by mouth 2 (two) times daily as needed for to loosen phlegm. 20 tablet 0   influenza vaccine adjuvanted (FLUAD QUADRIVALENT) 0.5 ML injection Inject 0.5 mLs into the muscle. 0.5 mL 0   ipratropium (ATROVENT) 0.03 % nasal spray Place 2 sprays into both nostrils every 12 (twelve) hours. As needed. 30 mL 0   levocetirizine (XYZAL) 5 MG tablet Take 5 mg by mouth every evening.     metoprolol succinate (TOPROL-XL) 25 MG 24 hr tablet Take 0.5  tablets (12.5 mg total) by mouth daily. 45 tablet 2   montelukast (SINGULAIR) 10 MG tablet Take 1 tablet (10 mg total) by mouth at bedtime. 20 tablet 0   Multiple Vitamins-Minerals (MULTI FOR HER 50+) TABS Take 1 tablet by mouth daily.     omeprazole (PRILOSEC) 20 MG capsule Take 1 capsule (20 mg total) by mouth in the morning 30 minutes before morning meal 90 capsule 0   Probiotic Product (PROBIOTIC PO) Take 1 capsule by mouth daily. Ultra Flora     traMADol (ULTRAM) 50 MG tablet Take 1 tablet (50 mg total) by mouth every 6 (six) hours as needed. (Patient taking differently:  Take 50 mg by mouth every 6 (six) hours as needed for moderate pain or severe pain.) 20 tablet 0   TURMERIC-GINGER PO Take 1,950 mg by mouth daily.     vitamin C (ASCORBIC ACID) 500 MG tablet Take 500 mg by mouth daily.     No current facility-administered medications for this encounter.    Allergies  Allergen Reactions   Augmentin [Amoxicillin-Pot Clavulanate] Diarrhea    Social History   Socioeconomic History   Marital status: Significant Other    Spouse name: Not on file   Number of children: 0   Years of education: Not on file   Highest education level: Not on file  Occupational History   Occupation: RN    Employer: [employer]  Tobacco Use   Smoking status: Former    Packs/day: 0.10    Years: 3.00    Total pack years: 0.30    Types: Cigarettes    Quit date: 10/02/1978    Years since quitting: 43.9   Smokeless tobacco: Never   Tobacco comments:    Former smoker 08/29/22  Vaping Use   Vaping Use: Never used  Substance and Sexual Activity   Alcohol use: Yes    Alcohol/week: 7.0 standard drinks of alcohol    Types: 7 Glasses of wine per week    Comment: per week   Drug use: No   Sexual activity: Not Currently    Birth control/protection: Post-menopausal  Other Topics Concern   Not on file  Social History Narrative   Not on file   Social Determinants of Health   Financial Resource Strain:  Not on file  Food Insecurity: Not on file  Transportation Needs: Not on file  Physical Activity: Not on file  Stress: Not on file  Social Connections: Not on file  Intimate Partner Violence: Not on file    Family History  Problem Relation Age of Onset   Colon polyps Father    Ulcerative colitis Father    CAD Father        Stent placement at age 50   Glaucoma Father    Hypertension Father    Colitis Father    Breast cancer Paternal Grandmother    Colon cancer Maternal Grandfather    Colon polyps Mother    Osteoporosis Mother    Thyroid disease Mother        hypo   Glaucoma Mother    Diverticulosis Mother    Sudden death Neg Hx    Hyperlipidemia Neg Hx    Heart attack Neg Hx    Diabetes Neg Hx     ROS- All systems are reviewed and negative except as per the HPI above  Physical Exam: Vitals:   08/29/22 1108  BP: 124/74  Pulse: (!) 49  Weight: 66 kg  Height: '5\' 4"'$  (1.626 m)   Wt Readings from Last 3 Encounters:  08/29/22 66 kg  08/01/22 63.5 kg  06/27/22 66.2 kg    Labs: Lab Results  Component Value Date   NA 133 (L) 07/18/2022   K 4.4 07/18/2022   CL 96 07/18/2022   CO2 23 07/18/2022   GLUCOSE 88 07/18/2022   BUN 13 07/18/2022   CREATININE 0.77 07/18/2022   CALCIUM 9.5 07/18/2022   MG 2.1 03/29/2022   No results found for: "INR" Lab Results  Component Value Date   CHOL 210 (H) 10/10/2021   HDL 87.70 10/10/2021   LDLCALC 109 (H) 10/10/2021   TRIG 64.0 10/10/2021  GEN- The patient is well appearing, alert and oriented x 3 today.   Head- normocephalic, atraumatic Eyes-  Sclera clear, conjunctiva pink Ears- hearing intact Oropharynx- clear Neck- supple, no JVP Lymph- no cervical lymphadenopathy Lungs- Clear to ausculation bilaterally, normal work of breathing Heart- Regular rate and rhythm, no murmurs, rubs or gallops, PMI not laterally displaced GI- soft, NT, ND, + BS Extremities- no clubbing, cyanosis, or edema MS- no significant  deformity or atrophy Skin- no rash or lesion Psych- euthymic mood, full affect Neuro- strength and sensation are intact  EKG-Vent. rate 49 BPM PR interval 152 ms QRS duration 98 ms QT/QTcB 488/440 ms P-R-T axes 70 50 70 Sinus bradycardia Otherwise normal ECG When compared with ECG of 01-Aug-2022 14:55, PREVIOUS ECG IS PRESENT    Assessment and Plan:  1. Afib S/p ablation one month ago and is doing well maintaining  SR Continue for now flecainide 50 mg bid and metoprolol succinate 12.5 mg qd Occasionally will see HR's in the upper 40's and for the most part feels ok with this, her apple watch shows mostly mid 50's   2. CHA2DS2VASc  score of 3 Continue eliquis 5 mg bid  Reminded no interruption in anticoagulation   3. HTN Stable   F/u with Dr. Curt Bears 10/30/22  Geroge Baseman. [facility] [address] [place], [place] [address] [phone]  If HR's tend in the 40's morso thatn current, contact the office for med adjustment

## 2022-08-30 ENCOUNTER — Ambulatory Visit (HOSPITAL_COMMUNITY): Payer: Medicare HMO | Admitting: Licensed Clinical Social Worker

## 2022-09-04 ENCOUNTER — Telehealth: Payer: Medicare HMO | Admitting: Physician Assistant

## 2022-09-04 ENCOUNTER — Other Ambulatory Visit (HOSPITAL_COMMUNITY): Payer: Self-pay

## 2022-09-04 DIAGNOSIS — B9689 Other specified bacterial agents as the cause of diseases classified elsewhere: Secondary | ICD-10-CM

## 2022-09-04 DIAGNOSIS — J019 Acute sinusitis, unspecified: Secondary | ICD-10-CM | POA: Diagnosis not present

## 2022-09-04 MED ORDER — DOXYCYCLINE HYCLATE 100 MG PO TABS
100.0000 mg | ORAL_TABLET | Freq: Two times a day (BID) | ORAL | 0 refills | Status: DC
  Filled 2022-09-04: qty 20, 10d supply, fill #0

## 2022-09-04 NOTE — Progress Notes (Signed)
Virtual Visit Consent   Kara Warren, you are scheduled for a virtual visit with a [facility] provider today. Just as with appointments in the office, your consent must be obtained to participate. Your consent will be active for this visit and any virtual visit you may have with one of our providers in the next 365 days. If you have a MyChart account, a copy of this consent can be sent to you electronically.  As this is a virtual visit, video technology does not allow for your provider to perform a traditional examination. This may limit your provider's ability to fully assess your condition. If your provider identifies any concerns that need to be evaluated in person or the need to arrange testing (such as labs, EKG, etc.), we will make arrangements to do so. Although advances in technology are sophisticated, we cannot ensure that it will always work on either your end or our end. If the connection with a video visit is poor, the visit may have to be switched to a telephone visit. With either a video or telephone visit, we are not always able to ensure that we have a secure connection.  By engaging in this virtual visit, you consent to the provision of healthcare and authorize for your insurance to be billed (if applicable) for the services provided during this visit. Depending on your insurance coverage, you may receive a charge related to this service.  I need to obtain your verbal consent now. Are you willing to proceed with your visit today? [name] has provided verbal consent on 09/04/2022 for a virtual visit (video or telephone). Mar Daring, PA-C  Date: 09/04/2022 10:53 AM  Virtual Visit via Video Note   I, Mar Daring, connected with  Kara Warren  ([id number], Aug 07, 1956) on 09/04/22 at 10:45 AM EST by a video-enabled telemedicine application and verified that I am speaking with the correct person using two identifiers.  Location: Patient: Virtual Visit  Location Patient: Home Provider: Virtual Visit Location Provider: Home Office   I discussed the limitations of evaluation and management by telemedicine and the availability of in person appointments. The patient expressed understanding and agreed to proceed.    History of Present Illness: Kara Warren is a 66 y.o. who identifies as a female who was assigned female at birth, and is being seen today for possible sinus infection.  HPI: Sinusitis This is a new problem. The current episode started 1 to 4 weeks ago (one week). The problem has been gradually worsening since onset. There has been no fever. The pain is moderate. Associated symptoms include chills, congestion, ear pain, headaches, sinus pressure and a sore throat (mild, from post nasal drainage). Pertinent negatives include no coughing or hoarse voice. (Throbbing pressure with bending forward, yesterday had right sided jaw pain that has resolved) Treatments tried: tylenol, saline nasal rinses, tylenol sinus, ear relief drops. The treatment provided no relief.     Problems:  Patient Active Problem List   Diagnosis Date Noted   Acquired thrombophilia ([facility]) 09/26/2021   Colonoscopy causing post-procedural bleeding 09/20/2020   Essential hypertension 09/20/2020   PAF (paroxysmal atrial fibrillation) ([facility]) 09/20/2020   Plantar wart 04/02/2014   Peroneal tendinitis of right lower extremity 03/12/2014   Diverticulosis 07/27/2012   Sinusitis, chronic    Colon polyp    Menopause    Migraine    NONSPCIFC ABN FINDING RAD & OTH EXAM LUNG FIELD 11/19/2009   HIP PAIN, RIGHT 04/12/2009  Hyperlipidemia 08/25/2008   Depression 08/25/2008    Allergies:  Allergies  Allergen Reactions   Augmentin [Amoxicillin-Pot Clavulanate] Diarrhea   Medications:  Current Outpatient Medications:    doxycycline (VIBRA-TABS) 100 MG tablet, Take 1 tablet (100 mg total) by mouth 2 (two) times daily., Disp: 20 tablet, Rfl: 0   apixaban (ELIQUIS) 5 MG  TABS tablet, Take 1 tablet (5 mg total) by mouth 2 (two) times daily., Disp: 60 tablet, Rfl: 11   Boswellia Serrata (BOSWELLIA PO), Take 500 mg by mouth daily., Disp: , Rfl:    Boswellia-Glucosamine-Vit D (OSTEO BI-FLEX ONE PER DAY PO), Take 1 capsule by mouth daily., Disp: , Rfl:    Calcium Carbonate-Vitamin D (CALTRATE 600+D PO), Take 1 tablet by mouth daily., Disp: , Rfl:    citalopram (CELEXA) 20 MG tablet, Take 1.5 tablets (30 mg total) by mouth daily. (Patient taking differently: Take 20 mg by mouth daily.), Disp: 145 tablet, Rfl: 1   cyanocobalamin (VITAMIN B12) 1000 MCG tablet, Take 1,000 mcg by mouth daily., Disp: , Rfl:    diltiazem (CARDIZEM) 30 MG tablet, Take 1 tablet (30 mg total) by mouth 2 (two) times daily if needed for AFIB, Disp: 45 tablet, Rfl: 1   flecainide (TAMBOCOR) 50 MG tablet, Take 1 tablet (50 mg total) by mouth 2 (two) times daily., Disp: 180 tablet, Rfl: 3   fluticasone (FLONASE) 50 MCG/ACT nasal spray, Place 1-2 sprays into both nostrils daily., Disp: 16 g, Rfl: 3   guaiFENesin (MUCINEX) 600 MG 12 hr tablet, Take 1 tablet (600 mg total) by mouth 2 (two) times daily as needed for to loosen phlegm., Disp: 20 tablet, Rfl: 0   influenza vaccine adjuvanted (FLUAD QUADRIVALENT) 0.5 ML injection, Inject 0.5 mLs into the muscle., Disp: 0.5 mL, Rfl: 0   ipratropium (ATROVENT) 0.03 % nasal spray, Place 2 sprays into both nostrils every 12 (twelve) hours. As needed., Disp: 30 mL, Rfl: 0   levocetirizine (XYZAL) 5 MG tablet, Take 5 mg by mouth every evening., Disp: , Rfl:    metoprolol succinate (TOPROL-XL) 25 MG 24 hr tablet, Take 0.5 tablets (12.5 mg total) by mouth daily., Disp: 45 tablet, Rfl: 2   montelukast (SINGULAIR) 10 MG tablet, Take 1 tablet (10 mg total) by mouth at bedtime., Disp: 20 tablet, Rfl: 0   Multiple Vitamins-Minerals (MULTI FOR HER 50+) TABS, Take 1 tablet by mouth daily., Disp: , Rfl:    omeprazole (PRILOSEC) 20 MG capsule, Take 1 capsule (20 mg total) by  mouth in the morning 30 minutes before morning meal, Disp: 90 capsule, Rfl: 0   Probiotic Product (PROBIOTIC PO), Take 1 capsule by mouth daily. Ultra Flora, Disp: , Rfl:    traMADol (ULTRAM) 50 MG tablet, Take 1 tablet (50 mg total) by mouth every 6 (six) hours as needed. (Patient taking differently: Take 50 mg by mouth every 6 (six) hours as needed for moderate pain or severe pain.), Disp: 20 tablet, Rfl: 0   TURMERIC-GINGER PO, Take 1,950 mg by mouth daily., Disp: , Rfl:    vitamin C (ASCORBIC ACID) 500 MG tablet, Take 500 mg by mouth daily., Disp: , Rfl:   Observations/Objective: Patient is well-developed, well-nourished in no acute distress.  Resting comfortably at home.  Head is normocephalic, atraumatic.  No labored breathing.  Speech is clear and coherent with logical content.  Patient is alert and oriented at baseline.    Assessment and Plan: 1. Acute bacterial sinusitis - doxycycline (VIBRA-TABS) 100 MG tablet; Take 1 tablet (  100 mg total) by mouth 2 (two) times daily.  Dispense: 20 tablet; Refill: 0  - Worsening symptoms that have not responded to OTC medications.  - Will give Doxycycline - Continue allergy medications.  - Steam and humidifier can help - Stay well hydrated and get plenty of rest.  - Seek in person evaluation if no symptom improvement or if symptoms worsen   Follow Up Instructions: I discussed the assessment and treatment plan with the patient. The patient was provided an opportunity to ask questions and all were answered. The patient agreed with the plan and demonstrated an understanding of the instructions.  A copy of instructions were sent to the patient via MyChart unless otherwise noted below.    The patient was advised to call back or seek an in-person evaluation if the symptoms worsen or if the condition fails to improve as anticipated.  Time:  I spent 10 minutes with the patient via telehealth technology discussing the above problems/concerns.     Mar Daring, PA-C

## 2022-09-04 NOTE — Patient Instructions (Signed)
Kara Warren, thank you for joining Mar Daring, PA-C for today's virtual visit.  While this provider is not your primary care provider (PCP), if your PCP is located in our provider database this encounter information will be shared with them immediately following your visit.   [facility] account gives you access to today's visit and all your visits, tests, and labs performed at [facility] " click here if you don't have a [place] account or go to [email or web address]  Consent: (Patient) Kara Warren provided verbal consent for this virtual visit at the beginning of the encounter.  Current Medications:  Current Outpatient Medications:    doxycycline (VIBRA-TABS) 100 MG tablet, Take 1 tablet (100 mg total) by mouth 2 (two) times daily., Disp: 20 tablet, Rfl: 0   apixaban (ELIQUIS) 5 MG TABS tablet, Take 1 tablet (5 mg total) by mouth 2 (two) times daily., Disp: 60 tablet, Rfl: 11   Boswellia Serrata (BOSWELLIA PO), Take 500 mg by mouth daily., Disp: , Rfl:    Boswellia-Glucosamine-Vit D (OSTEO BI-FLEX ONE PER DAY PO), Take 1 capsule by mouth daily., Disp: , Rfl:    Calcium Carbonate-Vitamin D (CALTRATE 600+D PO), Take 1 tablet by mouth daily., Disp: , Rfl:    citalopram (CELEXA) 20 MG tablet, Take 1.5 tablets (30 mg total) by mouth daily. (Patient taking differently: Take 20 mg by mouth daily.), Disp: 145 tablet, Rfl: 1   cyanocobalamin (VITAMIN B12) 1000 MCG tablet, Take 1,000 mcg by mouth daily., Disp: , Rfl:    diltiazem (CARDIZEM) 30 MG tablet, Take 1 tablet (30 mg total) by mouth 2 (two) times daily if needed for AFIB, Disp: 45 tablet, Rfl: 1   flecainide (TAMBOCOR) 50 MG tablet, Take 1 tablet (50 mg total) by mouth 2 (two) times daily., Disp: 180 tablet, Rfl: 3   fluticasone (FLONASE) 50 MCG/ACT nasal spray, Place 1-2 sprays into both nostrils daily., Disp: 16 g, Rfl: 3   guaiFENesin (MUCINEX) 600 MG 12 hr tablet, Take 1 tablet (600  mg total) by mouth 2 (two) times daily as needed for to loosen phlegm., Disp: 20 tablet, Rfl: 0   influenza vaccine adjuvanted (FLUAD QUADRIVALENT) 0.5 ML injection, Inject 0.5 mLs into the muscle., Disp: 0.5 mL, Rfl: 0   ipratropium (ATROVENT) 0.03 % nasal spray, Place 2 sprays into both nostrils every 12 (twelve) hours. As needed., Disp: 30 mL, Rfl: 0   levocetirizine (XYZAL) 5 MG tablet, Take 5 mg by mouth every evening., Disp: , Rfl:    metoprolol succinate (TOPROL-XL) 25 MG 24 hr tablet, Take 0.5 tablets (12.5 mg total) by mouth daily., Disp: 45 tablet, Rfl: 2   montelukast (SINGULAIR) 10 MG tablet, Take 1 tablet (10 mg total) by mouth at bedtime., Disp: 20 tablet, Rfl: 0   Multiple Vitamins-Minerals (MULTI FOR HER 50+) TABS, Take 1 tablet by mouth daily., Disp: , Rfl:    omeprazole (PRILOSEC) 20 MG capsule, Take 1 capsule (20 mg total) by mouth in the morning 30 minutes before morning meal, Disp: 90 capsule, Rfl: 0   Probiotic Product (PROBIOTIC PO), Take 1 capsule by mouth daily. Ultra Flora, Disp: , Rfl:    traMADol (ULTRAM) 50 MG tablet, Take 1 tablet (50 mg total) by mouth every 6 (six) hours as needed. (Patient taking differently: Take 50 mg by mouth every 6 (six) hours as needed for moderate pain or severe pain.), Disp: 20 tablet, Rfl: 0   TURMERIC-GINGER PO, Take 1,950 mg by mouth  daily., Disp: , Rfl:    vitamin C (ASCORBIC ACID) 500 MG tablet, Take 500 mg by mouth daily., Disp: , Rfl:    Medications ordered in this encounter:  Meds ordered this encounter  Medications   doxycycline (VIBRA-TABS) 100 MG tablet    Sig: Take 1 tablet (100 mg total) by mouth 2 (two) times daily.    Dispense:  20 tablet    Refill:  0    Order Specific Question:   Supervising Provider    Answer:   Chase Picket [id number]     *If you need refills on other medications prior to your next appointment, please contact your pharmacy*  Follow-Up: Call back or seek an in-person evaluation if the symptoms  worsen or if the condition fails to improve as anticipated.  [place] [phone]  Other Instructions  Sinus Infection, Adult A sinus infection, also called sinusitis, is inflammation of your sinuses. Sinuses are hollow spaces in the bones around your face. Your sinuses are located: Around your eyes. In the middle of your forehead. Behind your nose. In your cheekbones. Mucus normally drains out of your sinuses. When your nasal tissues become inflamed or swollen, mucus can become trapped or blocked. This allows bacteria, viruses, and fungi to grow, which leads to infection. Most infections of the sinuses are caused by a virus. A sinus infection can develop quickly. It can last for up to 4 weeks (acute) or for more than 12 weeks (chronic). A sinus infection often develops after a cold. What are the causes? This condition is caused by anything that creates swelling in the sinuses or stops mucus from draining. This includes: Allergies. Asthma. Infection from bacteria or viruses. Deformities or blockages in your nose or sinuses. Abnormal growths in the nose (nasal polyps). Pollutants, such as chemicals or irritants in the air. Infection from fungi. This is rare. What increases the risk? You are more likely to develop this condition if you: Have a weak body defense system (immune system). Do a lot of swimming or diving. Overuse nasal sprays. Smoke. What are the signs or symptoms? The main symptoms of this condition are pain and a feeling of pressure around the affected sinuses. Other symptoms include: Stuffy nose or congestion that makes it difficult to breathe through your nose. Thick yellow or greenish drainage from your nose. Tenderness, swelling, and warmth over the affected sinuses. A cough that may get worse at night. Decreased sense of smell and taste. Extra mucus that collects in the throat or the back of the nose (postnasal drip) causing a sore throat or bad  breath. Tiredness (fatigue). Fever. How is this diagnosed? This condition is diagnosed based on: Your symptoms. Your medical history. A physical exam. Tests to find out if your condition is acute or chronic. This may include: Checking your nose for nasal polyps. Viewing your sinuses using a device that has a light (endoscope). Testing for allergies or bacteria. Imaging tests, such as an MRI or CT scan. In rare cases, a bone biopsy may be done to rule out more serious types of fungal sinus disease. How is this treated? Treatment for a sinus infection depends on the cause and whether your condition is chronic or acute. If caused by a virus, your symptoms should go away on their own within 10 days. You may be given medicines to relieve symptoms. They include: Medicines that shrink swollen nasal passages (decongestants). A spray that eases inflammation of the nostrils (topical intranasal corticosteroids). Rinses  that help get rid of thick mucus in your nose (nasal saline washes). Medicines that treat allergies (antihistamines). Over-the-counter pain relievers. If caused by bacteria, your health care provider may recommend waiting to see if your symptoms improve. Most bacterial infections will get better without antibiotic medicine. You may be given antibiotics if you have: A severe infection. A weak immune system. If caused by narrow nasal passages or nasal polyps, surgery may be needed. Follow these instructions at home: Medicines Take, use, or apply over-the-counter and prescription medicines only as told by your health care provider. These may include nasal sprays. If you were prescribed an antibiotic medicine, take it as told by your health care provider. Do not stop taking the antibiotic even if you start to feel better. Hydrate and humidify  Drink enough fluid to keep your urine pale yellow. Staying hydrated will help to thin your mucus. Use a cool mist humidifier to keep the  humidity level in your home above 50%. Inhale steam for 10-15 minutes, 3-4 times a day, or as told by your health care provider. You can do this in the bathroom while a hot shower is running. Limit your exposure to cool or dry air. Rest Rest as much as possible. Sleep with your head raised (elevated). Make sure you get enough sleep each night. General instructions  Apply a warm, moist washcloth to your face 3-4 times a day or as told by your health care provider. This will help with discomfort. Use nasal saline washes as often as told by your health care provider. Wash your hands often with soap and water to reduce your exposure to germs. If soap and water are not available, use hand sanitizer. Do not smoke. Avoid being around people who are smoking (secondhand smoke). Keep all follow-up visits. This is important. Contact a health care provider if: You have a fever. Your symptoms get worse. Your symptoms do not improve within 10 days. Get help right away if: You have a severe headache. You have persistent vomiting. You have severe pain or swelling around your face or eyes. You have vision problems. You develop confusion. Your neck is stiff. You have trouble breathing. These symptoms may be an emergency. Get help right away. Call 911. Do not wait to see if the symptoms will go away. Do not drive yourself to the hospital. Summary A sinus infection is soreness and inflammation of your sinuses. Sinuses are hollow spaces in the bones around your face. This condition is caused by nasal tissues that become inflamed or swollen. The swelling traps or blocks the flow of mucus. This allows bacteria, viruses, and fungi to grow, which leads to infection. If you were prescribed an antibiotic medicine, take it as told by your health care provider. Do not stop taking the antibiotic even if you start to feel better. Keep all follow-up visits. This is important. This information is not intended to  replace advice given to you by your health care provider. Make sure you discuss any questions you have with your health care provider. Document Revised: 08/23/2021 Document Reviewed: 08/23/2021 Elsevier Patient Education  [place].    If you have been instructed to have an in-person evaluation today at a local Urgent Care facility, please use the link below. It will take you to a list of all of our available [facility], including address, phone number and hours of operation. Please do not delay care.  [facility]  If you or a family  member do not have a primary care provider, use the link below to schedule a visit and establish care. When you choose a [facility] primary care physician or advanced practice provider, you gain a long-term partner in health. Find a Primary Care Provider  Learn more about [facility]'s in-office and virtual care options: [facility]

## 2022-09-11 ENCOUNTER — Telehealth: Payer: Medicare HMO | Admitting: Internal Medicine

## 2022-09-18 ENCOUNTER — Other Ambulatory Visit: Payer: Self-pay

## 2022-09-18 ENCOUNTER — Ambulatory Visit (INDEPENDENT_AMBULATORY_CARE_PROVIDER_SITE_OTHER): Payer: Medicare HMO | Admitting: Family Medicine

## 2022-09-18 ENCOUNTER — Other Ambulatory Visit (HOSPITAL_COMMUNITY): Payer: Self-pay

## 2022-09-18 VITALS — BP 104/78 | Ht 64.0 in | Wt 140.0 lb

## 2022-09-18 DIAGNOSIS — M5416 Radiculopathy, lumbar region: Secondary | ICD-10-CM

## 2022-09-18 MED ORDER — TRAMADOL HCL 50 MG PO TABS
50.0000 mg | ORAL_TABLET | Freq: Four times a day (QID) | ORAL | 0 refills | Status: DC | PRN
  Filled 2022-09-18: qty 20, 5d supply, fill #0

## 2022-09-18 NOTE — Patient Instructions (Signed)
Take tylenol three times a day as needed as you have been. Baclofen as needed for spasms. Tramadol as needed for severe pain. Start physical therapy once a week for at least a few weeks, do home exercises/stretches on days you don't go to therapy. Consider injection after you come off the eliquis. Follow up with me in 1 month to 6 weeks.

## 2022-09-19 ENCOUNTER — Encounter: Payer: Self-pay | Admitting: Family Medicine

## 2022-09-19 ENCOUNTER — Ambulatory Visit (HOSPITAL_COMMUNITY): Payer: Medicare HMO | Admitting: Licensed Clinical Social Worker

## 2022-09-19 NOTE — Progress Notes (Signed)
PCP: Isaac Bliss, Rayford Halsted, MD  Subjective:   HPI: Patient is a 66 y.o. female here for low back pain.  8/14: Continuing to have left sided low back pain without radicular symptoms. MRI showed L4-5 disc bulge compressing the L5 nerve. She got an epidural injection 2 weeks ago which helped slightly. She has not been doing home exercises. Takes Tylenol as needed, but prefers to not take medications. No bowel/bladder dysfunction.  No numbness, tingling.  9/11: Patient reports low back pain is a little better. Doing physical therapy and home exercises. Going to pilates. No radicular symptoms. Her neck is bothering her more bilaterally. Also no radiation into extremities. She takes turmeric. 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  12/18: Patient's left sided low back pain recurred without injury. Was really severe on Friday. Pain across low back with some radiation into left buttock but not down leg. Taking tylenol with baclofen at night. Doing home exercises. Felt at most recent PT place it would flare up more and they may have been pushing her too hard. No bowel/bladder dysfunction.  Past Medical History:  Diagnosis Date   Abdominal pain, left upper quadrant    Acute bronchospasm    Acute neck pain 07/02/2019   Acute sinusitis, unspecified 05/20/2009   Centricity Description: SINUSITIS - ACUTE-NOS Qualifier: Diagnosis of  By: Birdie Riddle MD, Belenda Cruise   Centricity Description: ACUTE SINUSITIS, UNSPECIFIED Qualifier: Diagnosis of  By: Alveta Heimlich MD, Eugene   Centricity Description: SINUSITIS, ACUTE Qualifier: Diagnosis of  By: Koleen Nimrod MD, Dellis Filbert     Anxiety    Atypical chest pain 03/05/2013   Body mass index (BMI) of 23.0-23.9 in adult    Cervicalgia    Colon polyp    Costochondral junction syndrome    Cough    DEPRESSION 08/25/2008   Qualifier: Diagnosis of  By: Ronnald Ramp CMA, Chemira     Diarrhea    Diverticulosis 07/27/2012   Dorsalgia    Dysrhythmia    hx a-fib    Elevated blood pressure reading without diagnosis of hypertension    Endometrial polyp 05/2007   benign   Essential hypertension 09/20/2020   Family history of osteoporosis 12/28/2011   Mother pt had normal Dexa 11/2009    Fatigue    GERD (gastroesophageal reflux disease)    HIP PAIN, RIGHT 04/12/2009   Qualifier: Diagnosis of  By: Oneida Alar MD, KARL     History of hiatal hernia    HYPERLIPIDEMIA 08/25/2008   Qualifier: Diagnosis of  By: Birdie Riddle MD, Belenda Cruise     IBS (irritable bowel syndrome)    Left hip pain 07/30/2013   Left wrist pain 07/30/2013   Lumbar strain, initial encounter 07/28/2019   LUNG NODULE 11/19/2009   Neg CT     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 Please review for accuracy.   Otalgia, unspecified ear    Pain in throat    Palpitations    Paroxysmal atrial fibrillation (HCC)    Peroneal tendinitis of  right lower extremity 03/12/2014   Plantar wart 04/02/2014   Pneumonia    RHINITIS 06/21/2009   Qualifier: Diagnosis of  By: Birdie Riddle MD, Belenda Cruise     Right ankle injury, subsequent encounter 01/11/2018   Right foot pain 08/09/2011   Sinusitis, chronic    Skene's gland abscess 2009   patient unaware   Sprain of unspecified ligament of right ankle, subsequent encounter    TRANSAMINASES, SERUM, ELEVATED 12/07/2009   Qualifier: Diagnosis of  By: Inda Castle FNP, Melissa S    Unspecified cataract    Urinary tract infection, site not specified    Vitamin D deficiency     Current  Outpatient Medications on File Prior to Visit  Medication Sig Dispense Refill   apixaban (ELIQUIS) 5 MG TABS tablet Take 1 tablet (5 mg total) by mouth 2 (two) times daily. 60 tablet 11   Boswellia Serrata (BOSWELLIA PO) Take 500 mg by mouth daily.     Boswellia-Glucosamine-Vit D (OSTEO BI-FLEX ONE PER DAY PO) Take 1 capsule by mouth daily.     Calcium Carbonate-Vitamin D (CALTRATE 600+D PO) Take 1 tablet by mouth daily.     citalopram (CELEXA) 20 MG tablet Take 1.5 tablets (30 mg total) by mouth daily. (Patient taking differently: Take 20 mg by mouth daily.) 145 tablet 1   cyanocobalamin (VITAMIN B12) 1000 MCG tablet Take 1,000 mcg by mouth daily.     diltiazem (CARDIZEM) 30 MG tablet Take 1 tablet (30 mg total) by mouth 2 (two) times daily if needed for AFIB 45 tablet 1   doxycycline (VIBRA-TABS) 100 MG tablet Take 1 tablet (100 mg total) by mouth 2 (two) times daily. 20 tablet 0   flecainide (TAMBOCOR) 50 MG tablet Take 1 tablet (50 mg total) by mouth 2 (two) times daily. 180 tablet 3   fluticasone (FLONASE) 50 MCG/ACT nasal spray Place 1-2 sprays into both nostrils daily. 16 g 3   guaiFENesin (MUCINEX) 600 MG 12 hr tablet Take 1 tablet (600 mg total) by mouth 2 (two) times daily as needed for to loosen phlegm. 20 tablet 0   influenza vaccine adjuvanted (FLUAD QUADRIVALENT) 0.5 ML injection Inject 0.5 mLs into the muscle. 0.5 mL 0   ipratropium (ATROVENT) 0.03 % nasal spray Place 2 sprays into both nostrils every 12 (twelve) hours. As needed. 30 mL 0   levocetirizine (XYZAL) 5 MG tablet Take 5 mg by mouth every evening.     metoprolol succinate (TOPROL-XL) 25 MG 24 hr tablet Take 0.5 tablets (12.5 mg total) by mouth daily. 45 tablet 2   montelukast (SINGULAIR) 10 MG tablet Take 1 tablet (10 mg total) by mouth at bedtime. 20 tablet 0   Multiple Vitamins-Minerals (MULTI FOR HER 50+) TABS Take 1 tablet by mouth daily.     omeprazole (PRILOSEC) 20 MG capsule Take 1 capsule (20 mg total) by mouth in  the morning 30 minutes before morning meal 90 capsule 0   Probiotic Product (PROBIOTIC PO) Take 1 capsule by mouth daily. Ultra Flora     TURMERIC-GINGER PO Take 1,950 mg by mouth daily.     vitamin C (ASCORBIC ACID) 500 MG tablet Take 500 mg by mouth daily.     No current facility-administered medications on file prior to visit.    Past Surgical History:  Procedure Laterality Date   ATRIAL FIBRILLATION ABLATION N/A 08/01/2022   Procedure: ATRIAL FIBRILLATION ABLATION;  Surgeon: Constance Haw, MD;  Location: [facility];  Service: Cardiovascular;  Laterality: N/A;  BROW LIFT Bilateral 12/29/2021   Procedure: BLEPHAROPLASTY;  Surgeon: Lennice Sites, MD;  Location: [facility];  Service: Plastics;  Laterality: Bilateral;   COLONOSCOPY     x several   DILATION AND CURETTAGE OF UTERUS  05/2007   GYNECOLOGIC CRYOSURGERY  1980   HYSTEROSCOPY WITH D & C N/A 03/30/2017   Procedure: DILATATION & CURETTAGE/HYSTEROSCOPY WITH ULTRASOUND GUIDANCE;  Surgeon: Megan Salon, MD;  Location: [facility];  Service: Gynecology;  Laterality: N/A;   UPPER GI ENDOSCOPY     uterine ablation  2007    Allergies  Allergen Reactions   Augmentin [Amoxicillin-Pot Clavulanate] Diarrhea    BP 104/78   Ht '5\' 4"'$  (1.626 m)   Wt 140 lb (63.5 kg)   LMP 10/02/2006 (Approximate)   BMI 24.03 kg/m      08/30/2020    9:03 AM 06/08/2021    9:34 AM 08/22/2021   10:20 AM 11/09/2021   10:19 AM  [facility] Adult Exercise  Frequency of aerobic exercise (# of days/week) '7 7 7 6  '$ Average time in minutes 60 60 60 60  Frequency of strengthening activities (# of days/week) '3 3 2 3        '$ No data to display              Objective:  Physical Exam:  Gen: NAD, comfortable in exam room  Back: No gross deformity, scoliosis. TTP left lumbar paraspinal region.  No midline or bony TTP. FROM. Strength LEs 5/5 all muscle groups.   2+ MSRs in patellar and achilles tendons, equal bilaterally. Negative  SLRs. Sensation intact to light touch bilaterally.   Assessment & Plan:  Low back pain - known L4-5 disc bulge with irritation of left L5 nerve root.  Flared up again.  Rx tramadol to take with tylenol.  Baclofen at night.  Will try a different physical therapy location with home exercises as well.  Consider ESI when she completes treatment with eliquis (in about a month).

## 2022-10-03 ENCOUNTER — Encounter (HOSPITAL_COMMUNITY): Payer: Self-pay | Admitting: Licensed Clinical Social Worker

## 2022-10-03 ENCOUNTER — Ambulatory Visit (INDEPENDENT_AMBULATORY_CARE_PROVIDER_SITE_OTHER): Payer: Medicare HMO | Admitting: Licensed Clinical Social Worker

## 2022-10-03 DIAGNOSIS — F4321 Adjustment disorder with depressed mood: Secondary | ICD-10-CM | POA: Diagnosis not present

## 2022-10-03 DIAGNOSIS — R69 Illness, unspecified: Secondary | ICD-10-CM | POA: Diagnosis not present

## 2022-10-03 NOTE — Progress Notes (Signed)
   THERAPIST PROGRESS NOTE  Session Time: 12:30pm-1:30pm  Participation Level: Active  Behavioral Response: Well GroomedAlertDepressed  Type of Therapy: Individual Therapy  Treatment Goals addressed:  Malayna will identify patterns in relationships that are related to her level of self esteem and make choices more in line with higher levels of self esteem and self-compassion    ProgressTowards Goals: Progressing  Interventions: CBT  Summary: Kara Warren is a 67 y.o. female who presents with Adjustment Disorder with depressed mood.   Suicidal/Homicidal: Nowithout intent/plan  Therapist Response: Oriya engaged well in individual session with clinician. Clinician utilized CBT to process thoughts, feelings, and interactions. Clinician identified overall improvement in relationship with partner and his family. She reported increased warmth and comfort from his daughter, who even bought her Christmas gifts. Clinician explored relationship with partner and processed an incident where he was dismissive of her last night. Clinician challenged thoughts and fears that he would leave. Clinician noted the struggles of changing a man in his mid-70s. Clinician also identified the importance of communicating with him about how she felt and his actions. Clinician assisted in developing a basic outline of her words to address this with partner.   Plan: Return again in 2 weeks.  Diagnosis: Adjustment disorder with depressed mood  Collaboration of Care: Other none required in this session  Patient/Guardian was advised Release of Information must be obtained prior to any record release in order to collaborate their care with an outside provider. Patient/Guardian was advised if they have not already done so to contact the registration department to sign all necessary forms in order for [place] to release information regarding their care.   Consent: Patient/Guardian gives verbal consent for treatment and  assignment of benefits for services provided during this visit. Patient/Guardian expressed understanding and agreed to proceed.   [place], LCSW 10/03/2022

## 2022-10-04 ENCOUNTER — Encounter: Payer: Self-pay | Admitting: Internal Medicine

## 2022-10-05 ENCOUNTER — Other Ambulatory Visit: Payer: Self-pay | Admitting: Internal Medicine

## 2022-10-05 ENCOUNTER — Other Ambulatory Visit (HOSPITAL_COMMUNITY): Payer: Self-pay

## 2022-10-05 MED ORDER — CITALOPRAM HYDROBROMIDE 20 MG PO TABS
30.0000 mg | ORAL_TABLET | Freq: Every day | ORAL | 0 refills | Status: DC
  Filled 2022-10-05: qty 145, 96d supply, fill #0

## 2022-10-09 MED FILL — Metoprolol Succinate Tab ER 24HR 25 MG (Tartrate Equiv): ORAL | 90 days supply | Qty: 45 | Fill #1 | Status: AC

## 2022-10-13 ENCOUNTER — Other Ambulatory Visit: Payer: Self-pay | Admitting: Internal Medicine

## 2022-10-16 ENCOUNTER — Ambulatory Visit (HOSPITAL_BASED_OUTPATIENT_CLINIC_OR_DEPARTMENT_OTHER): Payer: Medicare HMO | Admitting: Physical Therapy

## 2022-10-16 ENCOUNTER — Other Ambulatory Visit (HOSPITAL_COMMUNITY): Payer: Self-pay

## 2022-10-16 MED ORDER — OMEPRAZOLE 20 MG PO CPDR
20.0000 mg | DELAYED_RELEASE_CAPSULE | Freq: Every morning | ORAL | 1 refills | Status: DC
  Filled 2022-10-16: qty 90, 90d supply, fill #0
  Filled 2023-01-10: qty 90, 90d supply, fill #1

## 2022-10-17 ENCOUNTER — Ambulatory Visit (INDEPENDENT_AMBULATORY_CARE_PROVIDER_SITE_OTHER): Payer: Medicare HMO | Admitting: Licensed Clinical Social Worker

## 2022-10-17 DIAGNOSIS — F4321 Adjustment disorder with depressed mood: Secondary | ICD-10-CM

## 2022-10-17 DIAGNOSIS — R69 Illness, unspecified: Secondary | ICD-10-CM | POA: Diagnosis not present

## 2022-10-18 ENCOUNTER — Encounter (HOSPITAL_COMMUNITY): Payer: Self-pay | Admitting: Licensed Clinical Social Worker

## 2022-10-18 NOTE — Progress Notes (Signed)
   THERAPIST PROGRESS NOTE  Session Time: 12:30pm-1:30pm  Participation Level: Active  Behavioral Response: Well GroomedAlertEuthymic  Type of Therapy: Individual Therapy  Treatment Goals addressed: [name] will identify patterns in relationships that are related to her level of self esteem and make choices more in line with higher levels of self esteem and self-compassion     ProgressTowards Goals: Progressing  Interventions: CBT  Summary: Kara Warren is a 67 y.o. female who presents with Adjustment disorder with depressed mood.   Suicidal/Homicidal: Nowithout intent/plan  Therapist Response: Izzy engaged well in individual session with clinician. Clinician utilized CBT to process updates in mood, interactions and thought processes. Clinician provided psychoeducation about automatic thoughts, particularly "shoulds". Clinician identified the importance of catching herself when she starts getting down on herself about what she should or must do. Clinician discussed catch, challenge, and change. Clinician processed updates in relationship and noted that it is moving more in a direction she would like. Clinician discussed [name]'s willingness to believe her partner and that he has made changes in his attitude about their relationship. [name] noted he is starting to think about them as a unit, rather than just about himself.   Plan: Return again in 2 weeks.  Diagnosis: Adjustment disorder with depressed mood  Collaboration of Care: Other none required in this session  Patient/Guardian was advised Release of Information must be obtained prior to any record release in order to collaborate their care with an outside provider. Patient/Guardian was advised if they have not already done so to contact the registration department to sign all necessary forms in order for [place] to release information regarding their care.   Consent: Patient/Guardian gives verbal consent for treatment and assignment of  benefits for services provided during this visit. Patient/Guardian expressed understanding and agreed to proceed.   Jobe Marker [place], LCSW 10/18/2022

## 2022-10-27 ENCOUNTER — Ambulatory Visit (HOSPITAL_BASED_OUTPATIENT_CLINIC_OR_DEPARTMENT_OTHER): Payer: Medicare HMO | Admitting: Physical Therapy

## 2022-10-29 DIAGNOSIS — Z20822 Contact with and (suspected) exposure to covid-19: Secondary | ICD-10-CM | POA: Diagnosis not present

## 2022-10-29 NOTE — Progress Notes (Unsigned)
Electrophysiology Office Note   Date:  10/30/2022   ID:  Kara Warren, DOB [birth year], MRN [id number]  PCP:  Isaac Bliss, Rayford Halsted, MD  Cardiologist:  [facility] Primary Electrophysiologist:  Anastasiya Gowin Meredith Leeds, MD    Chief Complaint: AF   History of Present Illness: Kara Warren is a 67 y.o. female who is being seen today for the evaluation of AF at the request of Isaac Bliss, [facility]*. Presenting today for electrophysiology evaluation.  Is a history seen for atrial fibrillation and hypertension.  She was diagnosed with atrial fibrillation 2 years ago.  She is currently on flecainide.  She continued to have episodes of atrial fibrillation and is now status post ablation 08/01/2022.  Today, denies symptoms of palpitations, chest pain, shortness of breath, orthopnea, PND, lower extremity edema, claudication, dizziness, presyncope, syncope, bleeding, or neurologic sequela. The patient is tolerating medications without difficulties.  Since being seen she has done well.  She has noted no further episodes of atrial fibrillation.  She is able to do all her daily activities.   Past Medical History:  Diagnosis Date   Abdominal pain, left upper quadrant    Acute bronchospasm    Acute neck pain 07/02/2019   Acute sinusitis, unspecified 05/20/2009   Centricity Description: SINUSITIS - ACUTE-NOS Qualifier: Diagnosis of  By: Birdie Riddle MD, Belenda Cruise   Centricity Description: ACUTE SINUSITIS, UNSPECIFIED Qualifier: Diagnosis of  By: Alveta Heimlich MD, Eugene   Centricity Description: SINUSITIS, ACUTE Qualifier: Diagnosis of  By: Koleen Nimrod MD, Dellis Filbert     Anxiety    Atypical chest pain 03/05/2013   Body mass index (BMI) of 23.0-23.9 in adult    Cervicalgia    Colon polyp    Costochondral junction syndrome    Cough    DEPRESSION 08/25/2008   Qualifier: Diagnosis of  By: Ronnald Ramp CMA, Chemira     Diarrhea    Diverticulosis 07/27/2012   Dorsalgia    Dysrhythmia    hx a-fib   Elevated blood  pressure reading without diagnosis of hypertension    Endometrial polyp 05/2007   benign   Essential hypertension 09/20/2020   Family history of osteoporosis 12/28/2011   Mother pt had normal Dexa 11/2009    Fatigue    GERD (gastroesophageal reflux disease)    HIP PAIN, RIGHT 04/12/2009   Qualifier: Diagnosis of  By: Oneida Alar MD, KARL     History of hiatal hernia    HYPERLIPIDEMIA 08/25/2008   Qualifier: Diagnosis of  By: Birdie Riddle MD, Belenda Cruise     IBS (irritable bowel syndrome)    Left hip pain 07/30/2013   Left wrist pain 07/30/2013   Lumbar strain, initial encounter 07/28/2019   LUNG NODULE 11/19/2009   Neg CT     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 Please review for accuracy.   Otalgia, unspecified ear    Pain in throat    Palpitations    Paroxysmal atrial fibrillation (HCC)    Peroneal tendinitis of right lower extremity  03/12/2014   Plantar wart 04/02/2014   Pneumonia    RHINITIS 06/21/2009   Qualifier: Diagnosis of  By: Birdie Riddle MD, Belenda Cruise     Right ankle injury, subsequent encounter 01/11/2018   Right foot pain 08/09/2011   Sinusitis, chronic    Skene's gland abscess 2009   patient unaware   Sprain of unspecified ligament of right ankle, subsequent encounter    TRANSAMINASES, SERUM, ELEVATED 12/07/2009   Qualifier: Diagnosis of  By: Inda Castle FNP, Melissa S    Unspecified cataract    Urinary tract infection, site not specified    Vitamin D deficiency    Past Surgical History:   Procedure Laterality Date   ATRIAL FIBRILLATION ABLATION N/A 08/01/2022   Procedure: ATRIAL FIBRILLATION ABLATION;  Surgeon: Constance Haw, MD;  Location: [facility];  Service: Cardiovascular;  Laterality: N/A;   BROW LIFT Bilateral 12/29/2021   Procedure: BLEPHAROPLASTY;  Surgeon: Lennice Sites, MD;  Location: [facility];  Service: Plastics;  Laterality: Bilateral;   COLONOSCOPY     x several   DILATION AND CURETTAGE OF UTERUS  05/2007   GYNECOLOGIC CRYOSURGERY  1980   HYSTEROSCOPY WITH D & C N/A 03/30/2017   Procedure: DILATATION & CURETTAGE/HYSTEROSCOPY WITH ULTRASOUND GUIDANCE;  Surgeon: Megan Salon, MD;  Location: [facility];  Service: Gynecology;  Laterality: N/A;   UPPER GI ENDOSCOPY     uterine ablation  2007     Current Outpatient Medications  Medication Sig Dispense Refill   apixaban (ELIQUIS) 5 MG TABS tablet Take 1 tablet (5 mg total) by mouth 2 (two) times daily. 60 tablet 11   Boswellia Serrata (BOSWELLIA PO) Take 500 mg by mouth daily.     Boswellia-Glucosamine-Vit D (OSTEO BI-FLEX ONE PER DAY PO) Take 1 capsule by mouth daily.     Calcium Carbonate-Vitamin D (CALTRATE 600+D PO) Take 1 tablet by mouth daily.     citalopram (CELEXA) 20 MG tablet Take 20 mg by mouth daily.     cyanocobalamin (VITAMIN B12) 1000 MCG tablet Take 1,000 mcg by mouth daily.     diltiazem (CARDIZEM) 30 MG tablet Take 1 tablet (30 mg total) by mouth 2 (two) times daily if needed for AFIB 45 tablet 1   fluticasone (FLONASE) 50 MCG/ACT nasal spray Place 1-2 sprays into both nostrils daily. 16 g 3   guaiFENesin (MUCINEX) 600 MG 12 hr tablet Take 1 tablet (600 mg total) by mouth 2 (two) times daily as needed for to loosen phlegm. 20 tablet 0   influenza vaccine adjuvanted (FLUAD QUADRIVALENT) 0.5 ML injection Inject 0.5 mLs into the muscle. 0.5 mL 0   ipratropium (ATROVENT) 0.03 % nasal spray Place 2 sprays into both nostrils every 12 (twelve) hours. As needed. 30 mL 0   levocetirizine  (XYZAL) 5 MG tablet Take 5 mg by mouth every evening.     Multiple Vitamins-Minerals (MULTI FOR HER 50+) TABS Take 1 tablet by mouth daily.     omeprazole (PRILOSEC) 20 MG capsule Take 1 capsule (20 mg total) by mouth in the morning 30 minutes before morning meal 90 capsule 1   Probiotic Product (PROBIOTIC PO) Take 1 capsule by mouth daily. Ultra Flora     TURMERIC-GINGER PO Take 1,950 mg by mouth daily.     vitamin C (ASCORBIC ACID) 500 MG tablet Take 500 mg by mouth daily.     No current facility-administered medications for this visit.    Allergies:   Augmentin [amoxicillin-pot clavulanate]   Social History:  The patient  reports that she quit smoking about 44 years ago. Her smoking use included cigarettes. She has a 0.30 pack-year smoking history. She has never used smokeless tobacco. She reports current alcohol use of about 7.0 standard drinks of alcohol per week. She reports that she does not use drugs.   Family History:  The patient's family history includes Breast cancer in her paternal grandmother; CAD in her father; Colitis in her father; Colon cancer in her maternal grandfather; Colon polyps in her father and mother; Diverticulosis in her mother; Glaucoma in her father and mother; Hypertension in her father; Osteoporosis in her mother; Thyroid disease in her mother; Ulcerative colitis in her father.   ROS:  Please see the history of present illness.   Otherwise, review of systems is positive for none.   All other systems are reviewed and negative.   PHYSICAL EXAM: VS:  BP 124/78   Pulse (!) 51   Ht 5' 4.5" (1.638 m)   Wt 142 lb (64.4 kg)   LMP 10/02/2006 (Approximate)   SpO2 98%   BMI 24.00 kg/m  , BMI Body mass index is 24 kg/m. GEN: Well nourished, well developed, in no acute distress  HEENT: normal  Neck: no JVD, carotid bruits, or masses Cardiac: RRR; no murmurs, rubs, or gallops,no edema  Respiratory:  clear to auscultation bilaterally, normal work of breathing GI:  soft, nontender, nondistended, + BS MS: no deformity or atrophy  Skin: warm and dry Neuro:  Strength and sensation are intact Psych: euthymic mood, full affect  EKG:  EKG is ordered today. Personal review of the ekg ordered shows sinus rhythm   Recent Labs: 03/29/2022: ALT 46; Magnesium 2.1; TSH 0.539 07/18/2022: BUN 13; Creatinine, Ser 0.77; Hemoglobin 12.2; Platelets 311; Potassium 4.4; Sodium 133    Lipid Panel     Component Value Date/Time   CHOL 210 (H) 10/10/2021 0923   TRIG 64.0 10/10/2021 0923   HDL 87.70 10/10/2021 0923   CHOLHDL 2 10/10/2021 0923   VLDL 12.8 10/10/2021 0923   LDLCALC 109 (H) 10/10/2021 0923     Wt Readings from Last 3 Encounters:  10/30/22 142 lb (64.4 kg)  09/18/22 140 lb (63.5 kg)  08/29/22 145 lb 9.6 oz (66 kg)      Other studies Reviewed: Additional studies/ records that were reviewed today include: TTE 04/06/22  Review of the above records today demonstrates:   1. Left ventricular ejection fraction, by estimation, is 60 to 65%. The  left ventricle has normal function. The left ventricle has no regional  wall motion abnormalities. Left ventricular diastolic parameters were  normal.   2. Right ventricular systolic function is normal. The right ventricular  size is normal. There is normal pulmonary artery systolic pressure.   3. Left atrial size was moderately to severely dilated.   4. Right atrial size was mildly dilated.   5. The mitral valve is normal in structure. Trivial mitral valve  regurgitation. No evidence of mitral stenosis.   6. The aortic valve is tricuspid. Aortic valve regurgitation is not  visualized. No aortic stenosis is present.   7. The inferior vena cava is normal in size with greater than 50%  respiratory variability, suggesting right atrial pressure of 3 mmHg.    ASSESSMENT AND PLAN:  1.  Paroxysmal atrial fibrillation: Currently on Toprol-XL, diltiazem, flecainide, Eliquis as above.  CHA2DS2-VASc of 1.  Status  post ablation 08/01/2022.  Remained in sinus rhythm.  Furqan Gosselin stop metoprolol and flecainide today.  2.  High risk medication monitoring: Currently on flecainide for atrial fibrillation as above.  QRS remains narrow.  3.  Secondary hypercoagulable date: Currently on Eliquis for atrial fibrillation as above.  Current medicines are reviewed at length with the patient today.   The patient does not have concerns regarding her medicines.  The following changes were made today: Stop metoprolol, flecainide  Labs/ tests ordered today include:  No orders of the defined types were placed in this encounter.    Disposition:   FU 6 months  Signed, Zelpha Messing Meredith Leeds, MD  10/30/2022 11:03 AM     [facility] [address] [place] [address] [phone] (office) [phone] (fax)

## 2022-10-30 ENCOUNTER — Encounter: Payer: Self-pay | Admitting: Cardiology

## 2022-10-30 ENCOUNTER — Ambulatory Visit: Payer: Medicare HMO | Attending: Cardiology | Admitting: Cardiology

## 2022-10-30 VITALS — BP 124/78 | HR 51 | Ht 64.5 in | Wt 142.0 lb

## 2022-10-30 DIAGNOSIS — D6869 Other thrombophilia: Secondary | ICD-10-CM | POA: Diagnosis not present

## 2022-10-30 DIAGNOSIS — I480 Paroxysmal atrial fibrillation: Secondary | ICD-10-CM | POA: Diagnosis not present

## 2022-10-30 DIAGNOSIS — Z20822 Contact with and (suspected) exposure to covid-19: Secondary | ICD-10-CM | POA: Diagnosis not present

## 2022-10-30 NOTE — Patient Instructions (Signed)
Medication Instructions:  °Your physician recommends that you continue on your current medications as directed. Please refer to the Current Medication list given to you today. ° °*If you need a refill on your cardiac medications before your next appointment, please call your pharmacy* ° ° °Lab Work: °None ordered ° ° °Testing/Procedures: °None ordered ° ° °Follow-Up: °At [facility], you and your health needs are our priority.  As part of our continuing mission to provide you with exceptional heart care, we have created designated Provider Care Teams.  These Care Teams include your primary Cardiologist (physician) and Advanced Practice Providers (APPs -  Physician Assistants and Nurse Practitioners) who all work together to provide you with the care you need, when you need it. ° °Your next appointment:   °6 month(s) ° °The format for your next appointment:   °In Person ° °Provider:   °Will Camnitz, MD ° ° ° °Thank you for choosing [facility]!! ° ° °Braelee Herrle, RN °[phone] °  °

## 2022-10-31 NOTE — Addendum Note (Signed)
Addended by: Gwendlyn Deutscher on: 10/31/2022 03:30 PM   Modules accepted: Orders

## 2022-11-01 ENCOUNTER — Ambulatory Visit (HOSPITAL_COMMUNITY): Payer: Medicare HMO | Admitting: Licensed Clinical Social Worker

## 2022-11-07 DIAGNOSIS — Z20822 Contact with and (suspected) exposure to covid-19: Secondary | ICD-10-CM | POA: Diagnosis not present

## 2022-11-08 ENCOUNTER — Encounter: Payer: Self-pay | Admitting: Family Medicine

## 2022-11-08 DIAGNOSIS — Z20822 Contact with and (suspected) exposure to covid-19: Secondary | ICD-10-CM | POA: Diagnosis not present

## 2022-11-09 ENCOUNTER — Other Ambulatory Visit: Payer: Self-pay

## 2022-11-09 ENCOUNTER — Other Ambulatory Visit: Payer: Self-pay | Admitting: Sports Medicine

## 2022-11-09 ENCOUNTER — Encounter (HOSPITAL_COMMUNITY): Payer: Self-pay | Admitting: *Deleted

## 2022-11-09 DIAGNOSIS — M5416 Radiculopathy, lumbar region: Secondary | ICD-10-CM

## 2022-11-09 DIAGNOSIS — Z20822 Contact with and (suspected) exposure to covid-19: Secondary | ICD-10-CM | POA: Diagnosis not present

## 2022-11-09 DIAGNOSIS — M542 Cervicalgia: Secondary | ICD-10-CM

## 2022-11-09 DIAGNOSIS — M5412 Radiculopathy, cervical region: Secondary | ICD-10-CM

## 2022-11-09 NOTE — Telephone Encounter (Signed)
Will proceed with cervical spine MRI, referral for PT - [facility] location.  Also set up for [facility] for lumbar spine at left L4-5 level.  Thanks!

## 2022-11-10 DIAGNOSIS — Z20822 Contact with and (suspected) exposure to covid-19: Secondary | ICD-10-CM | POA: Diagnosis not present

## 2022-11-12 DIAGNOSIS — Z20822 Contact with and (suspected) exposure to covid-19: Secondary | ICD-10-CM | POA: Diagnosis not present

## 2022-11-14 ENCOUNTER — Telehealth: Payer: Self-pay | Admitting: *Deleted

## 2022-11-14 ENCOUNTER — Other Ambulatory Visit: Payer: Self-pay | Admitting: Family Medicine

## 2022-11-14 ENCOUNTER — Ambulatory Visit (HOSPITAL_COMMUNITY): Payer: Medicare HMO | Admitting: Licensed Clinical Social Worker

## 2022-11-14 ENCOUNTER — Other Ambulatory Visit: Payer: Self-pay | Admitting: Family

## 2022-11-14 DIAGNOSIS — M5416 Radiculopathy, lumbar region: Secondary | ICD-10-CM

## 2022-11-14 DIAGNOSIS — M542 Cervicalgia: Secondary | ICD-10-CM

## 2022-11-14 DIAGNOSIS — Z20822 Contact with and (suspected) exposure to covid-19: Secondary | ICD-10-CM | POA: Diagnosis not present

## 2022-11-14 NOTE — Telephone Encounter (Signed)
Patient with diagnosis of afib on Eliquis for anticoagulation.    Procedure: CERVICAL AND EPIDURAL INJECTION  Date of procedure: TBD   CHA2DS2-VASc Score = 3   This indicates a 3.2% annual risk of stroke. The patient's score is based upon: CHF History: 0 HTN History: 1 Diabetes History: 0 Stroke History: 0 Vascular Disease History: 0 Age Score: 1 Gender Score: 1      CrCl 62 ml/min  Per office protocol, patient can hold Eliquis for 3 days prior to procedure.    **This guidance is not considered finalized until pre-operative APP has relayed final recommendations.**

## 2022-11-14 NOTE — Telephone Encounter (Signed)
Pharmacy please advise on holding Eliquis prior to cervical ESI scheduled for TBD. Thank you.

## 2022-11-14 NOTE — Telephone Encounter (Signed)
   Pre-operative Risk Assessment    Patient Name: Kara Warren  DOB: Mar 30, 1956 MRN: [id number]      Request for Surgical Clearance    Procedure:   CERVICAL AND EPIDURAL INJECTION  Date of Surgery:  Clearance TBD                                 Surgeon:  NOT LISTED Surgeon's Group or Practice Name:  [facility] Phone number:  [phone] ATTN: CATHY C. Fax number:  [phone]   Type of Clearance Requested:   - Medical  - Pharmacy:  Hold Apixaban (Eliquis) x 2 DAYS PRIOR   Type of Anesthesia:  Local    Additional requests/questions:    Jiles Prows   11/14/2022, 10:37 AM

## 2022-11-14 NOTE — Telephone Encounter (Signed)
   Name: MONICIA TSE  DOB: September 30, 1956  MRN: [id number]   Primary Cardiologist: Lauree Chandler, MD  Chart reviewed as part of pre-operative protocol coverage. Patient was contacted 11/14/2022 in reference to pre-operative risk assessment for pending surgery as outlined below.  VERONDA GABOR was last seen on 10/30/22 by Dr. Curt Bears.  Since that day, AMICA HARRON has done well with no new cardiac complaints.  She is able to achieve 4 metabolic equivalents of activity without any difficulty. Therefore, based on ACC/AHA guidelines, the patient would be at acceptable risk for the planned procedure without further cardiovascular testing.   The patient was advised that if she develops new symptoms prior to surgery to contact our office to arrange for a follow-up visit, and she verbalized understanding.  Per office protocol, patient can hold Eliquis for 3 days prior to procedure.     I will route this recommendation to the requesting party via Epic fax function and remove from pre-op pool. Please call with questions.  Mable Fill, Marissa Nestle, NP 11/14/2022, 2:19 PM

## 2022-11-15 DIAGNOSIS — Z20822 Contact with and (suspected) exposure to covid-19: Secondary | ICD-10-CM | POA: Diagnosis not present

## 2022-11-16 DIAGNOSIS — H2513 Age-related nuclear cataract, bilateral: Secondary | ICD-10-CM | POA: Diagnosis not present

## 2022-11-16 DIAGNOSIS — H25043 Posterior subcapsular polar age-related cataract, bilateral: Secondary | ICD-10-CM | POA: Diagnosis not present

## 2022-11-16 DIAGNOSIS — H25013 Cortical age-related cataract, bilateral: Secondary | ICD-10-CM | POA: Diagnosis not present

## 2022-11-16 DIAGNOSIS — H5213 Myopia, bilateral: Secondary | ICD-10-CM | POA: Diagnosis not present

## 2022-11-18 DIAGNOSIS — Z20822 Contact with and (suspected) exposure to covid-19: Secondary | ICD-10-CM | POA: Diagnosis not present

## 2022-11-22 NOTE — Therapy (Signed)
OUTPATIENT PHYSICAL THERAPY CERVICAL/LUMBAR EVALUATION   Patient Name: RENICE SOLARZ MRN: [id number] DOB:1955/11/30, 67 y.o., female Today's Date: 11/23/2022  END OF SESSION:  PT End of Session - 11/23/22 1535     Visit Number 1    Number of Visits 17    Date for PT Re-Evaluation 01/18/23    Authorization Type [employer] medicare    Authorization Time Period no auth    Progress Note Due on Visit 10    PT Start Time [id number]    PT Stop Time 1629    PT Time Calculation (min) 51 min    Activity Tolerance Patient tolerated treatment well;No increased pain    Behavior During Therapy WFL for tasks assessed/performed             Past Medical History:  Diagnosis Date   Abdominal pain, left upper quadrant    Acute bronchospasm    Acute neck pain 07/02/2019   Acute sinusitis, unspecified 05/20/2009   Centricity Description: SINUSITIS - ACUTE-NOS Qualifier: Diagnosis of  By: Birdie Riddle MD, Belenda Cruise   Centricity Description: ACUTE SINUSITIS, UNSPECIFIED Qualifier: Diagnosis of  By: Alveta Heimlich MD, Eugene   Centricity Description: SINUSITIS, ACUTE Qualifier: Diagnosis of  By: Koleen Nimrod MD, Dellis Filbert     Anxiety    Atypical chest pain 03/05/2013   Body mass index (BMI) of 23.0-23.9 in adult    Cervicalgia    Colon polyp    Costochondral junction syndrome    Cough    DEPRESSION 08/25/2008   Qualifier: Diagnosis of  By: Ronnald Ramp CMA, Chemira     Diarrhea    Diverticulosis 07/27/2012   Dorsalgia    Dysrhythmia    hx a-fib   Elevated blood pressure reading without diagnosis of hypertension    Endometrial polyp 05/2007   benign   Essential hypertension 09/20/2020   Family history of osteoporosis 12/28/2011   Mother pt had normal Dexa 11/2009    Fatigue    GERD (gastroesophageal reflux disease)    HIP PAIN, RIGHT 04/12/2009   Qualifier: Diagnosis of  By: Oneida Alar MD, KARL     History of hiatal hernia    HYPERLIPIDEMIA 08/25/2008   Qualifier: Diagnosis of  By: Birdie Riddle MD, Belenda Cruise     IBS  (irritable bowel syndrome)    Left hip pain 07/30/2013   Left wrist pain 07/30/2013   Lumbar strain, initial encounter 07/28/2019   LUNG NODULE 11/19/2009   Neg CT     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 Please review for accuracy.   Otalgia, unspecified ear    Pain in throat    Palpitations    Paroxysmal atrial fibrillation (HCC)    Peroneal tendinitis of right lower extremity 03/12/2014   Plantar wart 04/02/2014   Pneumonia    RHINITIS 06/21/2009   Qualifier: Diagnosis of  By: Birdie Riddle MD, Belenda Cruise     Right ankle injury, subsequent encounter 01/11/2018   Right foot pain 08/09/2011   Sinusitis, chronic    Skene's gland  abscess 2009   patient unaware   Sprain of unspecified ligament of right ankle, subsequent encounter    TRANSAMINASES, SERUM, ELEVATED 12/07/2009   Qualifier: Diagnosis of  By: Inda Castle FNP, Melissa S    Unspecified cataract    Urinary tract infection, site not specified    Vitamin D deficiency    Past Surgical History:  Procedure Laterality Date   ATRIAL FIBRILLATION ABLATION N/A 08/01/2022   Procedure: ATRIAL FIBRILLATION ABLATION;  Surgeon: Constance Haw, MD;  Location: [facility];  Service: Cardiovascular;  Laterality: N/A;   BROW LIFT Bilateral 12/29/2021   Procedure: BLEPHAROPLASTY;  Surgeon: Lennice Sites, MD;  Location: [facility];  Service: Plastics;  Laterality: Bilateral;   COLONOSCOPY     x several   DILATION AND CURETTAGE OF UTERUS  05/2007    GYNECOLOGIC CRYOSURGERY  1980   HYSTEROSCOPY WITH D & C N/A 03/30/2017   Procedure: DILATATION & CURETTAGE/HYSTEROSCOPY WITH ULTRASOUND GUIDANCE;  Surgeon: Megan Salon, MD;  Location: [facility];  Service: Gynecology;  Laterality: N/A;   UPPER GI ENDOSCOPY     uterine ablation  2007   Patient Active Problem List   Diagnosis Date Noted   Acquired thrombophilia ([place]) 09/26/2021   Colonoscopy causing post-procedural bleeding 09/20/2020   Essential hypertension 09/20/2020   PAF (paroxysmal atrial fibrillation) ([place]) 09/20/2020   Plantar wart 04/02/2014   Peroneal tendinitis of right lower extremity 03/12/2014   Diverticulosis 07/27/2012   Sinusitis, chronic    Colon polyp    Menopause    Migraine    NONSPCIFC ABN FINDING RAD & OTH EXAM LUNG FIELD 11/19/2009   HIP PAIN, RIGHT 04/12/2009   Hyperlipidemia 08/25/2008   Depression 08/25/2008    PCP: Isaac Bliss, Rayford Halsted, MD  REFERRING PROVIDER: Thurman Coyer, DO   REFERRING DIAG: [phone] (ICD-10-CM) - Radiculopathy of lumbar region M54.2 (ICD-10-CM) - Neck pain M54.12 (ICD-10-CM) - Cervical radiculopathy  THERAPY DIAG:  Other low back pain  Cervicalgia  Muscle weakness (generalized)  Rationale for Evaluation and Treatment: Rehabilitation  ONSET DATE: fluctuating for several years  SUBJECTIVE:                                                                                                                                                                                                         SUBJECTIVE STATEMENT: Pt endorses chronic neck/back pain that has been fluctuating for several years. No MOI reported, pt endorses a fall ~10 years ago that she feels may have contributing to symptoms although she recalls symptoms predated the fall. Describes herself  as being quite active (does pilates, walks 2-3 miles a day) but has been modifying/limiting activities to mitigate exacerbations in symptoms. Primary complaint is  stiffness although notes that she does occasionally get headaches, as well as LUE and L hip pain. Imaging as noted below, red flag questioning reassuring. Has had PT, dry needling in past with some relief. Gets monthly massages with transient relief. Is getting cervical and lumbar injections in march and April, respectively.    PERTINENT HISTORY:  anxiety/depression, HTN, GERD, multiple MSK issues, Afib s/p ablation (all of above well controlled per discussion w/ pt)  PAIN:  Are you having pain: neck "tightness" no pain, back no pain Location/description: L sided low back occasionally to BIL belt line, neck L side and BIL Best-worst over past week: 0-8/10  Per eval -  - aggravating factors: prolonged positioning, sitting without support, more strenuous activities - Easing factors: rest, heat, epsom salt baths, lidocaine patches, medication  PRECAUTIONS: none  WEIGHT BEARING RESTRICTIONS: No  FALLS:  Has patient fallen in last 6 months? 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 At L4-5 there is a mild broad-based disc bulge with a left paracentral disc protrusion resulting in mass effect on the left intraspinal L5 nerve root. Mild spinal stenosis. 2.  No acute osseous injury of the lumbar spine.  Cervical MRI 01/2022 IMPRESSION: 1. Unchanged severe right C5-6 neural foraminal stenosis secondary to disc osteophyte complex greatest in the right subarticular zone. 2. Unchanged small right foraminal protrusion at C3-4 with mild right neural foraminal  stenosis. 3. No spinal canal stenosis. 4. Mild right C7 facet edema.  PATIENT SURVEYS:  FOTO 63 current, 67 predicted  COGNITION: Overall cognitive status: Within functional limits for tasks assessed  SENSATION/NEURO: Light touch intact all extremities   Finger<>nose testing unremarkable No clonus BIL  Negative hoffmann and tromner sign  No ataxia with gait   POSTURE: No Significant postural limitations  PALPATION: Concordant tenderness L suboccipitals, levator scap with trigger points throughout   CERVICAL ROM:   Active ROM A/PROM (deg) eval  Flexion 100%  Extension 100%  Right lateral flexion 50% s  Left lateral flexion 50% s  Right rotation 44 deg s  Left rotation 52 deg s   (Blank rows = not tested) (Key: WFL = within functional limits not formally assessed, * = concordant pain, s = stiffness/stretching sensation, NT = not tested)  Comments: cervical mobility painless but does have L sided stiffness w/ B rotation   LUMBAR ROM:   Active  A/PROM  eval  Flexion 100% (touches floor)  Extension 75% *   Right lateral flexion 100% (below knee)  Left lateral flexion 75% just above knee)  Right rotation 100%  Left rotation 75%   (Blank rows = not tested) (Key: WFL = within functional limits not formally assessed, * = concordant pain, s = stiffness/stretching sensation, NT = not tested)  Comments:   UPPER EXTREMITY ROM:  A/PROM Right eval Left eval  Shoulder flexion 5 5  Shoulder abduction 5 5  Shoulder internal rotation 5 5  Shoulder external rotation 5 5  Elbow flexion    Elbow extension    Wrist flexion    Wrist extension     (Blank rows = not tested) (Key: WFL = within functional limits not formally assessed, * = concordant pain, s = stiffness/stretching sensation, NT = not tested)  Comments: flexion/abduction B UE WFL and symmetrical   UPPER EXTREMITY MMT:  MMT Right eval Left eval  Shoulder flexion 5 5  Shoulder extension    Shoulder  abduction 5 5  Shoulder extension    Shoulder internal rotation 5 4+  Shoulder external rotation 5 4+  Elbow flexion    Elbow extension    Grip strength    (Blank rows = not tested)  (Key: WFL = within functional limits not formally assessed, * = concordant pain, s = stiffness/stretching sensation, NT = not tested)  Comments:     TODAY'S TREATMENT:                                                                                                                              [facility] Adult PT Treatment:                                                DATE: 11/23/22 Deferred in favor of increased time w/ discussion/education as below  PATIENT EDUCATION:  Education details: extensive time spent with pt education on PT impairments, prognosis, and POC. Informed consent. Rationale for interventions, monitoring tolerance to activities and encouraged continued participation in appropriate exercise Person educated: Patient Education method: Explanation, Demonstration, Tactile cues, Verbal cues Education comprehension: verbalized understanding/agreement  HOME EXERCISE PROGRAM: Deferred on this date  ASSESSMENT:  CLINICAL IMPRESSION: Patient is a very pleasant 67 y.o. woman who was seen today for physical therapy evaluation and treatment for chronic neck/back pain. Pain has been ongoing for several years with fluctuations, pt remains fairly active although she notes she has modified/limited activities to avoid exacerbations. Imaging as above, red flag screening reassuring. On exam pt demos concordant tenderness of L suboccipitals/LS with corresponding limitations in cervical mobility, lumbar mobility limitations in L sidebending and extension with reproduction of concordant low back pain. Pt tolerates exam well and demos good GH strength although L RC musculature appears slightly weaker than R. Significant time spent w/ pt discussion/education re: activity participation, PT impairments/prognosis/POC.  Recommend skilled PT to address aforementioned deficits with aim of maximizing participation in recreational exercise for improved overall health and improve tolerance to daily activities. No adverse events, pt departs today's session in no acute distress, all voiced questions/concerns addressed appropriately from PT perspective.     OBJECTIVE IMPAIRMENTS: decreased activity tolerance, decreased endurance, decreased ROM, decreased strength, hypomobility, increased muscle spasms, and pain.   ACTIVITY LIMITATIONS: lifting, bending, sitting, standing, stairs, and locomotion level  PARTICIPATION LIMITATIONS: community activity  PERSONAL FACTORS: Age, Time since onset of injury/illness/exacerbation, and 1-2 comorbidities: anxiety/depression, Afib  are also affecting patient's functional outcome.   REHAB POTENTIAL: Good  CLINICAL DECISION MAKING: Stable/uncomplicated  EVALUATION COMPLEXITY: Low   GOALS: Goals reviewed with patient? No  SHORT TERM GOALS: Target date: 12/21/2022   Pt will demonstrate appropriate understanding and performance of initially prescribed HEP in order to facilitate improved independence with management of symptoms.  Baseline: HEP to be established Goal status: INITIAL     LONG TERM GOALS: Target date: 01/18/2023  Pt will score 67 on FOTO in order to demonstrate improved perception of function due to symptoms. Baseline: 63 Goal status: INITIAL  2. Pt will demonstrate at least 60 degrees of active cervical rotation ROM in order to demonstrate improved environmental awareness and safety with driving.  Baseline: see ROM chart above Goal status: INITIAL  3. Pt will demonstrate grossly symmetrical GH ER/IR MMT for improved symmetry of UE strength and improved tolerance to functional movements.  Baseline: see MMT chart above Goal status: INITIAL   4. Pt will demonstrate improved symmetry of lumbar ROM in order to promote improved tolerance to multiplanar movements  for participation in recreational activities. Baseline: see ROM chart above Goal status: INITIAL   5. Pt will demonstrate independence w/ final prescribed HEP in order to facilitate improved self efficacy with management of symptoms.   Baseline: HEP TBD  Goal status: INITIAL  6. Pt will endorse greater than or equal to 50% reduction in pain severity over preceding week in order to facilitate improved tolerance to daily and recreational activities.   Baseline: 0-8/10 over past week  Goal status: INITIAL   PLAN:  PT FREQUENCY: 2x/week  PT DURATION: 8 weeks  PLANNED INTERVENTIONS: Therapeutic exercises, Therapeutic activity, Neuromuscular re-education, Balance training, Gait training, Patient/Family education, Self Care, Joint mobilization, Stair training, DME instructions, Aquatic Therapy, Dry Needling, Electrical stimulation, Spinal mobilization, Cryotherapy, Moist heat, Taping, Manual therapy, and Re-evaluation  PLAN FOR NEXT SESSION: establish HEP. Manual as indicated/appropriate. Recommend cervical mobility as able/appropriate, periscapular/core training. Would probably benefit from more rotational lumbopelvic training given preferred activities (golf, pickleball)   Leeroy Cha PT, DPT 11/23/2022 5:05 PM

## 2022-11-23 ENCOUNTER — Ambulatory Visit: Payer: Medicare HMO | Attending: Internal Medicine | Admitting: Physical Therapy

## 2022-11-23 ENCOUNTER — Encounter: Payer: Self-pay | Admitting: Physical Therapy

## 2022-11-23 ENCOUNTER — Other Ambulatory Visit: Payer: Self-pay

## 2022-11-23 DIAGNOSIS — M6281 Muscle weakness (generalized): Secondary | ICD-10-CM | POA: Diagnosis not present

## 2022-11-23 DIAGNOSIS — M5459 Other low back pain: Secondary | ICD-10-CM | POA: Diagnosis not present

## 2022-11-23 DIAGNOSIS — M542 Cervicalgia: Secondary | ICD-10-CM | POA: Diagnosis not present

## 2022-11-28 ENCOUNTER — Ambulatory Visit (INDEPENDENT_AMBULATORY_CARE_PROVIDER_SITE_OTHER): Payer: Medicare HMO | Admitting: Licensed Clinical Social Worker

## 2022-11-28 DIAGNOSIS — F4321 Adjustment disorder with depressed mood: Secondary | ICD-10-CM

## 2022-11-28 DIAGNOSIS — R69 Illness, unspecified: Secondary | ICD-10-CM | POA: Diagnosis not present

## 2022-11-29 NOTE — Therapy (Signed)
OUTPATIENT PHYSICAL THERAPY TREATMENT NOTE   Patient Name: Kara Warren MRN: [id number] DOB:1956/05/13, 67 y.o., female Today's Date: 11/30/2022  PCP: Isaac Bliss, Rayford Halsted, MD   REFERRING PROVIDER: Thurman Coyer, DO  END OF SESSION:   PT End of Session - 11/30/22 1326     Visit Number 2    Number of Visits 17    Date for PT Re-Evaluation 01/18/23    Authorization Type [employer]etna medicare    Authorization Time Period no auth    Progress Note Due on Visit 10    PT Start Time 1326    PT Stop Time 1411    PT Time Calculation (min) 45 min    Activity Tolerance Patient tolerated treatment well;No increased pain    Behavior During Therapy WFL for tasks assessed/performed             Past Medical History:  Diagnosis Date   Abdominal pain, left upper quadrant    Acute bronchospasm    Acute neck pain 07/02/2019   Acute sinusitis, unspecified 05/20/2009   Centricity Description: SINUSITIS - ACUTE-NOS Qualifier: Diagnosis of  By: Birdie Riddle MD, Belenda Cruise   Centricity Description: ACUTE SINUSITIS, UNSPECIFIED Qualifier: Diagnosis of  By: Alveta Heimlich MD, Eugene   Centricity Description: SINUSITIS, ACUTE Qualifier: Diagnosis of  By: Koleen Nimrod MD, Dellis Filbert     Anxiety    Atypical chest pain 03/05/2013   Body mass index (BMI) of 23.0-23.9 in adult    Cervicalgia    Colon polyp    Costochondral junction syndrome    Cough    DEPRESSION 08/25/2008   Qualifier: Diagnosis of  By: Ronnald Ramp CMA, Chemira     Diarrhea    Diverticulosis 07/27/2012   Dorsalgia    Dysrhythmia    hx a-fib   Elevated blood pressure reading without diagnosis of hypertension    Endometrial polyp 05/2007   benign   Essential hypertension 09/20/2020   Family history of osteoporosis 12/28/2011   Mother pt had normal Dexa 11/2009    Fatigue    GERD (gastroesophageal reflux disease)    HIP PAIN, RIGHT 04/12/2009   Qualifier: Diagnosis of  By: Oneida Alar MD, KARL     History of hiatal hernia    HYPERLIPIDEMIA  08/25/2008   Qualifier: Diagnosis of  By: Birdie Riddle MD, Belenda Cruise     IBS (irritable bowel syndrome)    Left hip pain 07/30/2013   Left wrist pain 07/30/2013   Lumbar strain, initial encounter 07/28/2019   LUNG NODULE 11/19/2009   Neg CT     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 Please review for accuracy.   Otalgia, unspecified ear    Pain in throat    Palpitations    Paroxysmal atrial fibrillation (HCC)    Peroneal tendinitis of right lower extremity 03/12/2014   Plantar wart 04/02/2014   Pneumonia    RHINITIS 06/21/2009   Qualifier: Diagnosis of  By: Birdie Riddle MD, Belenda Cruise     Right ankle injury, subsequent encounter  01/11/2018   Right foot pain 08/09/2011   Sinusitis, chronic    Skene's gland abscess 2009   patient unaware   Sprain of unspecified ligament of right ankle, subsequent encounter    TRANSAMINASES, SERUM, ELEVATED 12/07/2009   Qualifier: Diagnosis of  By: Inda Castle FNP, Melissa S    Unspecified cataract    Urinary tract infection, site not specified    Vitamin D deficiency    Past Surgical History:  Procedure Laterality Date   ATRIAL FIBRILLATION ABLATION N/A 08/01/2022   Procedure: ATRIAL FIBRILLATION ABLATION;  Surgeon: Constance Haw, MD;  Location: [facility];  Service: Cardiovascular;  Laterality: N/A;   BROW LIFT Bilateral 12/29/2021   Procedure: BLEPHAROPLASTY;  Surgeon: Lennice Sites, MD;  Location: [place] [facility];  Service: Plastics;  Laterality: Bilateral;   COLONOSCOPY      x several   DILATION AND CURETTAGE OF UTERUS  05/2007   GYNECOLOGIC CRYOSURGERY  1980   HYSTEROSCOPY WITH D & C N/A 03/30/2017   Procedure: DILATATION & CURETTAGE/HYSTEROSCOPY WITH ULTRASOUND GUIDANCE;  Surgeon: Megan Salon, MD;  Location: [facility];  Service: Gynecology;  Laterality: N/A;   UPPER GI ENDOSCOPY     uterine ablation  2007   Patient Active Problem List   Diagnosis Date Noted   Acquired thrombophilia ([place]) 09/26/2021   Colonoscopy causing post-procedural bleeding 09/20/2020   Essential hypertension 09/20/2020   PAF (paroxysmal atrial fibrillation) ([place]) 09/20/2020   Plantar wart 04/02/2014   Peroneal tendinitis of right lower extremity 03/12/2014   Diverticulosis 07/27/2012   Sinusitis, chronic    Colon polyp    Menopause    Migraine    NONSPCIFC ABN FINDING RAD & OTH EXAM LUNG FIELD 11/19/2009   HIP PAIN, RIGHT 04/12/2009   Hyperlipidemia 08/25/2008   Depression 08/25/2008    REFERRING DIAG: M54.16 (ICD-10-CM) - Radiculopathy of lumbar region M54.2 (ICD-10-CM) - Neck pain M54.12 (ICD-10-CM) - Cervical radiculopathy  THERAPY DIAG:  Other low back pain  Cervicalgia  Muscle weakness (generalized)  Rationale for Evaluation and Treatment Rehabilitation  PERTINENT HISTORY: anxiety/depression, HTN, GERD, multiple MSK issues, Afib s/p ablation (all of above well controlled per discussion w/ pt)   PRECAUTIONS: none   PATIENT GOALS: would like to get back to more activities (golf, pickleball), remain active with less pain   SUBJECTIVE:                                                                                                                                                                                      SUBJECTIVE STATEMENT:   Pt states her neck has bothered her a bit more the past few days, unsure of provocative  factor. No new issues reported.   PAIN:  Are you having pain: neck "tightness" no pain, back no pain Location/description: L sided low  back occasionally to BIL belt line, neck L side and BIL Best-worst over past week: 0-8/10  Per eval -  - aggravating factors: prolonged positioning, sitting without support, more strenuous activities - Easing factors: rest, heat, epsom salt baths, lidocaine patches, medication   OBJECTIVE: (objective measures completed at initial evaluation unless otherwise dated)   DIAGNOSTIC FINDINGS:  Lumbar MRI 04/2022 IMPRESSION: 1. At L4-5 there is a mild broad-based disc bulge with a left paracentral disc protrusion resulting in mass effect on the left intraspinal L5 nerve root. Mild spinal stenosis. 2.  No acute osseous injury of the lumbar spine.  Cervical MRI 01/2022 IMPRESSION: 1. Unchanged severe right C5-6 neural foraminal stenosis secondary to disc osteophyte complex greatest in the right subarticular zone. 2. Unchanged small right foraminal protrusion at C3-4 with mild right neural foraminal stenosis. 3. No spinal canal stenosis. 4. Mild right C7 facet edema.   PATIENT SURVEYS:  FOTO 63 current, 67 predicted   COGNITION: Overall cognitive status: Within functional limits for tasks assessed   SENSATION/NEURO: Light touch intact all extremities   Finger<>nose testing unremarkable No clonus BIL  Negative hoffmann and tromner sign  No ataxia with gait     POSTURE: No Significant postural limitations   PALPATION: Concordant tenderness L suboccipitals, levator scap with trigger points throughout       CERVICAL ROM:    Active ROM A/PROM (deg) eval  Flexion 100%  Extension 100%  Right lateral flexion 50% s  Left lateral flexion 50% s  Right rotation 44 deg s  Left rotation 52 deg s   (Blank rows = not tested) (Key: WFL = within functional limits not formally assessed, * = concordant pain, s = stiffness/stretching sensation, NT = not tested)  Comments: cervical mobility painless but does have L sided stiffness w/ B rotation     LUMBAR ROM:    Active  A/PROM  eval   Flexion 100% (touches floor)  Extension 75% *   Right lateral flexion 100% (below knee)  Left lateral flexion 75% just above knee)  Right rotation 100%  Left rotation 75%   (Blank rows = not tested) (Key: WFL = within functional limits not formally assessed, * = concordant pain, s = stiffness/stretching sensation, NT = not tested)  Comments:    UPPER EXTREMITY ROM:   A/PROM Right eval Left eval  Shoulder flexion    Shoulder abduction    Shoulder internal rotation    Shoulder external rotation    Elbow flexion      Elbow extension      Wrist flexion      Wrist extension       (Blank rows = not tested) (Key: WFL = within functional limits not formally assessed, * = concordant pain, s = stiffness/stretching sensation, NT = not tested)  Comments: flexion/abduction B UE WFL and symmetrical    UPPER EXTREMITY MMT:   MMT Right eval Left eval  Shoulder flexion 5 5  Shoulder extension      Shoulder abduction 5 5  Shoulder extension      Shoulder internal rotation 5 4+  Shoulder external rotation 5 4+  Elbow flexion      Elbow extension      Grip strength      (Blank rows = not tested)  (Key: WFL = within functional limits not formally assessed, * =  concordant pain, s = stiffness/stretching sensation, NT = not tested)  Comments:        TODAY'S TREATMENT:                                                                                                [facility] Adult PT Treatment:                                                DATE: 11/30/22 Therapeutic Exercise: 93mn treadmill 1.039m-1.8mph during subjective Bridge + adductor iso 2x12 cues for form, core activation, breath control  Chin tuck 2x12 cues for appropriate retraction/form, reduced compensations High>low row cable column 7#  x10, x10 with GTB in doorway for HEP  Manual Therapy: Seated STM L LS, UT, rhomboid; active pin and stretch L LS with cervical flexion x5 and rotation to R x5   Therapeutic Activity: Discussion  re: walking program and monitoring symptom response with aim of facilitating improved tolerance and examining relevant variables    PATIENT EDUCATION:  Education details:rationale for interventions, HEP Person educated: Patient Education method: Explanation, Demonstration, Tactile cues, Verbal cues, handout Education comprehension: verbalized understanding/agreement   HOME EXERCISE PROGRAM: Access Code: [email or web address]: [email or web address] Date: 11/30/2022 Prepared by: DaEnis SlipperExercises - Seated Cervical Retraction  - 1 x daily - 7 x weekly - 2 sets - 12 reps - Standing High Row with Resistance  - 1 x daily - 7 x weekly - 2 sets - 10 reps   ASSESSMENT:   CLINICAL IMPRESSION: 11/30/2022 Pt arrives w/o pain although does note her neck has bothered her a bit the past few days. Discussed walking program, monitoring symptom behavior with aim of addressing relevant variables. Introduction of manual on this date with good relief reported, improved mobility. Followed with exercises emphasizing lumbopelvic endurance and appropriate strengthening of relevant cervical/periscapular musculature. Pt tolerates well, no adverse events, and endorses improved mobility on departure. HEP as above, GTB provided. Pt departs today's session in no acute distress, all voiced questions/concerns addressed appropriately from PT perspective.     Eval - Patient is a very pleasant 89.o. woman who was seen today for physical therapy evaluation and treatment for chronic neck/back pain. Pain has been ongoing for several years with fluctuations, pt remains fairly active although she notes she has modified/limited activities to avoid exacerbations. Imaging as above, red flag screening reassuring. On exam pt demos concordant tenderness of L suboccipitals/LS with corresponding limitations in cervical mobility, lumbar mobility limitations in L sidebending and extension with reproduction of concordant low back  pain. Pt tolerates exam well and demos good GH strength although L RC musculature appears slightly weaker than R. Significant time spent w/ pt discussion/education re: activity participation, PT impairments/prognosis/POC. Recommend skilled PT to address aforementioned deficits with aim of maximizing participation in recreational exercise for improved overall health and improve tolerance to daily activities. No adverse events, pt departs today's session in no acute distress, all voiced questions/concerns addressed appropriately from  PT perspective.      OBJECTIVE IMPAIRMENTS: decreased activity tolerance, decreased endurance, decreased ROM, decreased strength, hypomobility, increased muscle spasms, and pain.    ACTIVITY LIMITATIONS: lifting, bending, sitting, standing, stairs, and locomotion level   PARTICIPATION LIMITATIONS: community activity   PERSONAL FACTORS: Age, Time since onset of injury/illness/exacerbation, and 1-2 comorbidities: anxiety/depression, Afib  are also affecting patient's functional outcome.    REHAB POTENTIAL: Good   CLINICAL DECISION MAKING: Stable/uncomplicated   EVALUATION COMPLEXITY: Low     GOALS: Goals reviewed with patient? No   SHORT TERM GOALS: Target date: 12/21/2022   Pt will demonstrate appropriate understanding and performance of initially prescribed HEP in order to facilitate improved independence with management of symptoms.  Baseline: HEP to be established Goal status: INITIAL        LONG TERM GOALS: Target date: 01/18/2023   Pt will score 67 on FOTO in order to demonstrate improved perception of function due to symptoms. Baseline: 63 Goal status: INITIAL   2. Pt will demonstrate at least 60 degrees of active cervical rotation ROM in order to demonstrate improved environmental awareness and safety with driving.  Baseline: see ROM chart above Goal status: INITIAL   3. Pt will demonstrate grossly symmetrical GH ER/IR MMT for improved symmetry of  UE strength and improved tolerance to functional movements.  Baseline: see MMT chart above Goal status: INITIAL    4. Pt will demonstrate improved symmetry of lumbar ROM in order to promote improved tolerance to multiplanar movements for participation in recreational activities. Baseline: see ROM chart above Goal status: INITIAL    5. Pt will demonstrate independence w/ final prescribed HEP in order to facilitate improved self efficacy with management of symptoms.             Baseline: HEP TBD            Goal status: INITIAL   6. Pt will endorse greater than or equal to 50% reduction in pain severity over preceding week in order to facilitate improved tolerance to daily and recreational activities.             Baseline: 0-8/10 over past week            Goal status: INITIAL     PLAN:   PT FREQUENCY: 2x/week   PT DURATION: 8 weeks   PLANNED INTERVENTIONS: Therapeutic exercises, Therapeutic activity, Neuromuscular re-education, Balance training, Gait training, Patient/Family education, Self Care, Joint mobilization, Stair training, DME instructions, Aquatic Therapy, Dry Needling, Electrical stimulation, Spinal mobilization, Cryotherapy, Moist heat, Taping, Manual therapy, and Re-evaluation   PLAN FOR NEXT SESSION: look at multiplanar movements with emphasis on core rotational stability given pt report of wanting to get back to golfing, pickleball. Manual PRN as indicated   Leeroy Cha PT, DPT 11/30/2022 3:54 PM

## 2022-11-30 ENCOUNTER — Encounter (HOSPITAL_COMMUNITY): Payer: Self-pay | Admitting: Licensed Clinical Social Worker

## 2022-11-30 ENCOUNTER — Ambulatory Visit: Payer: Medicare HMO | Admitting: Physical Therapy

## 2022-11-30 ENCOUNTER — Encounter: Payer: Self-pay | Admitting: Physical Therapy

## 2022-11-30 DIAGNOSIS — M5459 Other low back pain: Secondary | ICD-10-CM | POA: Diagnosis not present

## 2022-11-30 DIAGNOSIS — M6281 Muscle weakness (generalized): Secondary | ICD-10-CM | POA: Diagnosis not present

## 2022-11-30 DIAGNOSIS — M542 Cervicalgia: Secondary | ICD-10-CM

## 2022-11-30 NOTE — Progress Notes (Signed)
   THERAPIST PROGRESS NOTE  Session Time: 12:30pm-1:25pm  Participation Level: Active  Behavioral Response: Well GroomedAlertEuthymic  Type of Therapy: Individual Therapy  Treatment Goals addressed: [name] will identify patterns in relationships that are related to her level of self esteem and make choices more in line with higher levels of self esteem and self-compassion       ProgressTowards Goals: Progressing  Interventions: Motivational Interviewing  Summary: Kara Warren is a 67 y.o. female who presents with Adjustment Disorder with depressed mood.   Suicidal/Homicidal: Nowithout intent/plan  Therapist Response: [place] engaged well in individual session with clinician. Clinician utilized MI OARS to reflect and summarize thoughts and feelings. Clinician processed current stresses in relationship and provided time and space for [name] to self-examine and make choices more in line with her values. Clinician reflected increase in willingness to stand up for herself, make her own plans, and to not take "crap" anymore from her partner. Clinician reflected the change in [place] over the past few years, particularly due to health issues post retirement. Clinician noted the value and importance of having adventures and doing what she wants to do, whether it is with someone or on her own. Clinician reflected the joy of independence and how to get back to that past self.   Plan: Return again in 2 weeks.  Diagnosis: Adjustment disorder with depressed mood  Collaboration of Care: Patient refused AEB none required in this session  Patient/Guardian was advised Release of Information must be obtained prior to any record release in order to collaborate their care with an outside provider. Patient/Guardian was advised if they have not already done so to contact the registration department to sign all necessary forms in order for [place] to release information regarding their care.   Consent:  Patient/Guardian gives verbal consent for treatment and assignment of benefits for services provided during this visit. Patient/Guardian expressed understanding and agreed to proceed.   Jobe Marker [place], LCSW 11/30/2022

## 2022-12-04 ENCOUNTER — Ambulatory Visit
Admission: RE | Admit: 2022-12-04 | Discharge: 2022-12-04 | Disposition: A | Discharge status: Home / Self Care | Payer: Medicare HMO | Source: Ambulatory Visit | Attending: Family Medicine | Admitting: Family Medicine

## 2022-12-04 DIAGNOSIS — M542 Cervicalgia: Secondary | ICD-10-CM

## 2022-12-04 DIAGNOSIS — M5412 Radiculopathy, cervical region: Secondary | ICD-10-CM | POA: Diagnosis not present

## 2022-12-04 MED ORDER — TRIAMCINOLONE ACETONIDE 40 MG/ML IJ SUSP (RADIOLOGY)
60.0000 mg | Freq: Once | INTRAMUSCULAR | Status: AC
  Administered 2022-12-04: 60 mg via EPIDURAL

## 2022-12-04 MED ORDER — IOPAMIDOL (ISOVUE-M 300) INJECTION 61%
1.0000 mL | Freq: Once | INTRAMUSCULAR | Status: AC
  Administered 2022-12-04: 1 mL via EPIDURAL

## 2022-12-04 NOTE — Progress Notes (Signed)
Pt called into [facility] right after being discharged from receiving a steroid injection. Pt reports once she got home she developed itching, redness, and hives noted on bilateral upper extremities. Pt denied any shortness of breath, itching of her throat, swelling of her lips or tongue. Notified Dr. Maree Erie of this. Advised pt to take 50 mg of benadryl and to seek medical attention if symptoms do not subside or if any new or worsening symptoms appear. Pt verbalized understanding. A 13 hr prep will be recommended for this patient for any future steroid injections. Unsure if the reaction was due to lidocaine, kenalog, or Iohexol.

## 2022-12-04 NOTE — Discharge Instructions (Signed)

## 2022-12-07 ENCOUNTER — Ambulatory Visit: Payer: Medicare HMO | Admitting: Physical Therapy

## 2022-12-11 ENCOUNTER — Encounter: Payer: Self-pay | Admitting: Physical Therapy

## 2022-12-11 ENCOUNTER — Ambulatory Visit: Payer: Medicare HMO | Attending: Internal Medicine | Admitting: Physical Therapy

## 2022-12-11 DIAGNOSIS — M5459 Other low back pain: Secondary | ICD-10-CM

## 2022-12-11 DIAGNOSIS — M6281 Muscle weakness (generalized): Secondary | ICD-10-CM

## 2022-12-11 DIAGNOSIS — M62830 Muscle spasm of back: Secondary | ICD-10-CM | POA: Diagnosis not present

## 2022-12-11 DIAGNOSIS — M542 Cervicalgia: Secondary | ICD-10-CM

## 2022-12-11 NOTE — Therapy (Signed)
OUTPATIENT PHYSICAL THERAPY TREATMENT NOTE   Patient Name: Kara Warren MRN: [id number] DOB:1956/09/22, 67 y.o., female Today's Date: 12/11/2022  PCP: Isaac Bliss, Rayford Halsted, MD   REFERRING PROVIDER: Thurman Coyer, DO  END OF SESSION:   PT End of Session - 12/11/22 1008     Visit Number 3    Number of Visits 17    Date for PT Re-Evaluation 01/18/23    Authorization Type [employer] medicare    Progress Note Due on Visit 10    PT Start Time 1011    PT Stop Time 1100    PT Time Calculation (min) 49 min    Activity Tolerance Patient tolerated treatment well    Behavior During Therapy WFL for tasks assessed/performed              Past Medical History:  Diagnosis Date   Abdominal pain, left upper quadrant    Acute bronchospasm    Acute neck pain 07/02/2019   Acute sinusitis, unspecified 05/20/2009   Centricity Description: SINUSITIS - ACUTE-NOS Qualifier: Diagnosis of  By: Birdie Riddle MD, Belenda Cruise   Centricity Description: ACUTE SINUSITIS, UNSPECIFIED Qualifier: Diagnosis of  By: Alveta Heimlich MD, Eugene   Centricity Description: SINUSITIS, ACUTE Qualifier: Diagnosis of  By: Koleen Nimrod MD, Dellis Filbert     Anxiety    Atypical chest pain 03/05/2013   Body mass index (BMI) of 23.0-23.9 in adult    Cervicalgia    Colon polyp    Costochondral junction syndrome    Cough    DEPRESSION 08/25/2008   Qualifier: Diagnosis of  By: Ronnald Ramp CMA, Chemira     Diarrhea    Diverticulosis 07/27/2012   Dorsalgia    Dysrhythmia    hx a-fib   Elevated blood pressure reading without diagnosis of hypertension    Endometrial polyp 05/2007   benign   Essential hypertension 09/20/2020   Family history of osteoporosis 12/28/2011   Mother pt had normal Dexa 11/2009    Fatigue    GERD (gastroesophageal reflux disease)    HIP PAIN, RIGHT 04/12/2009   Qualifier: Diagnosis of  By: Oneida Alar MD, KARL     History of hiatal hernia    HYPERLIPIDEMIA 08/25/2008   Qualifier: Diagnosis of  By: Birdie Riddle MD,  Belenda Cruise     IBS (irritable bowel syndrome)    Left hip pain 07/30/2013   Left wrist pain 07/30/2013   Lumbar strain, initial encounter 07/28/2019   LUNG NODULE 11/19/2009   Neg CT     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 Please review for accuracy.   Otalgia, unspecified ear    Pain in throat    Palpitations    Paroxysmal atrial fibrillation (HCC)    Peroneal tendinitis of right lower extremity 03/12/2014   Plantar wart 04/02/2014   Pneumonia    RHINITIS 06/21/2009   Qualifier: Diagnosis of  By: Birdie Riddle MD, Katherine     Right ankle injury, subsequent encounter 01/11/2018   Right foot pain 08/09/2011  Sinusitis, chronic    Skene's gland abscess 2009   patient unaware   Sprain of unspecified ligament of right ankle, subsequent encounter    TRANSAMINASES, SERUM, ELEVATED 12/07/2009   Qualifier: Diagnosis of  By: Inda Castle FNP, Melissa S    Unspecified cataract    Urinary tract infection, site not specified    Vitamin D deficiency    Past Surgical History:  Procedure Laterality Date   ATRIAL FIBRILLATION ABLATION N/A 08/01/2022   Procedure: ATRIAL FIBRILLATION ABLATION;  Surgeon: Constance Haw, MD;  Location: [facility];  Service: Cardiovascular;  Laterality: N/A;   BROW LIFT Bilateral 12/29/2021   Procedure: BLEPHAROPLASTY;  Surgeon: Lennice Sites, MD;  Location: [facility];  Service: Plastics;  Laterality: Bilateral;   COLONOSCOPY     x several   DILATION AND CURETTAGE OF UTERUS   05/2007   GYNECOLOGIC CRYOSURGERY  1980   HYSTEROSCOPY WITH D & C N/A 03/30/2017   Procedure: DILATATION & CURETTAGE/HYSTEROSCOPY WITH ULTRASOUND GUIDANCE;  Surgeon: Megan Salon, MD;  Location: [facility];  Service: Gynecology;  Laterality: N/A;   UPPER GI ENDOSCOPY     uterine ablation  2007   Patient Active Problem List   Diagnosis Date Noted   Acquired thrombophilia ([facility]) 09/26/2021   Colonoscopy causing post-procedural bleeding 09/20/2020   Essential hypertension 09/20/2020   PAF (paroxysmal atrial fibrillation) ([place]) 09/20/2020   Plantar wart 04/02/2014   Peroneal tendinitis of right lower extremity 03/12/2014   Diverticulosis 07/27/2012   Sinusitis, chronic    Colon polyp    Menopause    Migraine    NONSPCIFC ABN FINDING RAD & OTH EXAM LUNG FIELD 11/19/2009   HIP PAIN, RIGHT 04/12/2009   Hyperlipidemia 08/25/2008   Depression 08/25/2008    REFERRING DIAG: M54.16 (ICD-10-CM) - Radiculopathy of lumbar region M54.2 (ICD-10-CM) - Neck pain M54.12 (ICD-10-CM) - Cervical radiculopathy  THERAPY DIAG:  Other low back pain  Cervicalgia  Muscle weakness (generalized)  Muscle spasm of back  Rationale for Evaluation and Treatment Rehabilitation  PERTINENT HISTORY: anxiety/depression, HTN, GERD, multiple MSK issues, Afib s/p ablation (all of above well controlled per discussion w/ pt)   PRECAUTIONS: none   PATIENT GOALS: would like to get back to more activities (golf, pickleball), remain active with less pain                                                                                                                                                                                SUBJECTIVE STATEMENT:   "I had an injection last week for the neck which really helped a lot. The back hasn't been bad at all. I am schedule to get an injection in the  back in April."  PAIN:  Are you having pain: neck "tightness" no pain, back no pain Location/description: L sided low back  occasionally to BIL belt line, neck L side and BIL Best-worst over past week: 0-8/10  Per eval -  - aggravating factors: prolonged positioning, sitting without support, more strenuous activities - Easing factors: rest, heat, epsom salt baths, lidocaine patches, medication   OBJECTIVE: (objective measures completed at initial evaluation unless otherwise dated)   DIAGNOSTIC FINDINGS:  Lumbar MRI 04/2022 IMPRESSION: 1. At L4-5 there is a mild broad-based disc bulge with a left paracentral disc protrusion resulting in mass effect on the left intraspinal L5 nerve root. Mild spinal stenosis. 2.  No acute osseous injury of the lumbar spine.  Cervical MRI 01/2022 IMPRESSION: 1. Unchanged severe right C5-6 neural foraminal stenosis secondary to disc osteophyte complex greatest in the right subarticular zone. 2. Unchanged small right foraminal protrusion at C3-4 with mild right neural foraminal stenosis. 3. No spinal canal stenosis. 4. Mild right C7 facet edema.   PATIENT SURVEYS:  FOTO 63 current, 67 predicted   COGNITION: Overall cognitive status: Within functional limits for tasks assessed   SENSATION/NEURO: Light touch intact all extremities   Finger<>nose testing unremarkable No clonus BIL  Negative hoffmann and tromner sign  No ataxia with gait     POSTURE: No Significant postural limitations   PALPATION: Concordant tenderness L suboccipitals, levator scap with trigger points throughout       CERVICAL ROM:    Active ROM A/PROM (deg) eval  Flexion 100%  Extension 100%  Right lateral flexion 50% s  Left lateral flexion 50% s  Right rotation 44 deg s  Left rotation 52 deg s   (Blank rows = not tested) (Key: WFL = within functional limits not formally assessed, * = concordant pain, s = stiffness/stretching sensation, NT = not tested)  Comments: cervical mobility painless but does have L sided stiffness w/ B rotation     LUMBAR ROM:    Active  A/PROM  eval  Flexion  100% (touches floor)  Extension 75% *   Right lateral flexion 100% (below knee)  Left lateral flexion 75% just above knee)  Right rotation 100%  Left rotation 75%   (Blank rows = not tested) (Key: WFL = within functional limits not formally assessed, * = concordant pain, s = stiffness/stretching sensation, NT = not tested)  Comments:    UPPER EXTREMITY ROM:   A/PROM Right eval Left eval  Shoulder flexion    Shoulder abduction    Shoulder internal rotation    Shoulder external rotation    Elbow flexion      Elbow extension      Wrist flexion      Wrist extension       (Blank rows = not tested) (Key: WFL = within functional limits not formally assessed, * = concordant pain, s = stiffness/stretching sensation, NT = not tested)  Comments: flexion/abduction B UE WFL and symmetrical    UPPER EXTREMITY MMT:   MMT Right eval Left eval  Shoulder flexion 5 5  Shoulder extension      Shoulder abduction 5 5  Shoulder extension      Shoulder internal rotation 5 4+  Shoulder external rotation 5 4+  Elbow flexion      Elbow extension      Grip strength      (Blank rows = not tested)  (Key: WFL = within functional limits not formally assessed, * = concordant  pain, s = stiffness/stretching sensation, NT = not tested)  Comments:        TODAY'S TREATMENT:                                                                                                [facility] Adult PT Treatment:                                                DATE: 12/11/22  Therapeutic Exercise: Ellipitcal L5 x ramp Level 5 x 5 min  Standing calf stretch 2 x 30 sec gastroc Standing hamstring stretch 2 x 30sec ( 1x L/ R) Palloff press 2 x 15 with 13# Chop at the free motion 2 x x 10 with 13#, modified weight to 10# to help with proper form Scapular retraction Bil ER 2 x 15 PNF D1/ D2 with green TB bil UE 1 x 15 ea Standing mini squat with lateral band walking  Updated HEP for PNF D1/D2, scapular retraction with ER,  sitting/ standing hamstring stretch Manual Therapy: Tack and stretch of the hamstring using tennis ball in seated position.    [facility] Adult PT Treatment:                                                DATE: 11/30/22 Therapeutic Exercise: 41mn treadmill 1.053m-1.8mph during subjective Bridge + adductor iso 2x12 cues for form, core activation, breath control  Chin tuck 2x12 cues for appropriate retraction/form, reduced compensations High>low row cable column 7#  x10, x10 with GTB in doorway for HEP  Manual Therapy: Seated STM L LS, UT, rhomboid; active pin and stretch L LS with cervical flexion x5 and rotation to R x5   Therapeutic Activity: Discussion re: walking program and monitoring symptom response with aim of facilitating improved tolerance and examining relevant variables    PATIENT EDUCATION:  Education details:rationale for interventions, HEP Person educated: Patient Education method: Explanation, Demonstration, Tactile cues, Verbal cues, handout Education comprehension: verbalized understanding/agreement   HOME EXERCISE PROGRAM: Access Code: [email or web address]: [email or web address] Date: 12/11/2022 Prepared by: KrStarr LakeExercises - Seated Cervical Retraction  - 1 x daily - 7 x weekly - 2 sets - 12 reps - Standing High Row with Resistance  - 1 x daily - 7 x weekly - 2 sets - 10 reps - Seated Hamstring Stretch  - 1-2 x daily - 7 x weekly - 2 sets - 2 reps - 30 hold - Standing Hamstring Stretch with Step  - 1-2 x daily - 7 x weekly - 2 sets - 2 reps - 30 hold - Shoulder PNF D2 with Resistance  - 1 x daily - 7 x weekly - 2 sets - 15 reps - Standing Single Arm Shoulder PNF D1 Extension with Anchored Resistance  - 1 x daily - 7 x weekly - 2  sets - 15 reps - Scapular retraction with ER (MONEY)  - 1 x daily - 7 x weekly - 2 sets - 15 reps - Side Stepping with Resistance at Feet  - 1 x daily - 7 x weekly - 2 sets - 10 reps   ASSESSMENT:   CLINICAL  IMPRESSION: 3/11/2024pt arrives to PT reporting she is doing better in her neck which she had a injection in her neck last week, and the back has been doing okay. Continued working on core activation with anti-rotation exercises and lift/ chop and posterior chain activation which she did well with requiring min cues for proper form intermittently. Updated HEP today to promote hip/ shoulder strengthening.    Eval - Patient is a very pleasant 67 y.o. woman who was seen today for physical therapy evaluation and treatment for chronic neck/back pain. Pain has been ongoing for several years with fluctuations, pt remains fairly active although she notes she has modified/limited activities to avoid exacerbations. Imaging as above, red flag screening reassuring. On exam pt demos concordant tenderness of L suboccipitals/LS with corresponding limitations in cervical mobility, lumbar mobility limitations in L sidebending and extension with reproduction of concordant low back pain. Pt tolerates exam well and demos good GH strength although L RC musculature appears slightly weaker than R. Significant time spent w/ pt discussion/education re: activity participation, PT impairments/prognosis/POC. Recommend skilled PT to address aforementioned deficits with aim of maximizing participation in recreational exercise for improved overall health and improve tolerance to daily activities. No adverse events, pt departs today's session in no acute distress, all voiced questions/concerns addressed appropriately from PT perspective.      OBJECTIVE IMPAIRMENTS: decreased activity tolerance, decreased endurance, decreased ROM, decreased strength, hypomobility, increased muscle spasms, and pain.    ACTIVITY LIMITATIONS: lifting, bending, sitting, standing, stairs, and locomotion level   PARTICIPATION LIMITATIONS: community activity   PERSONAL FACTORS: Age, Time since onset of injury/illness/exacerbation, and 1-2 comorbidities:  anxiety/depression, Afib  are also affecting patient's functional outcome.    REHAB POTENTIAL: Good   CLINICAL DECISION MAKING: Stable/uncomplicated   EVALUATION COMPLEXITY: Low     GOALS: Goals reviewed with patient? No   SHORT TERM GOALS: Target date: 12/21/2022   Pt will demonstrate appropriate understanding and performance of initially prescribed HEP in order to facilitate improved independence with management of symptoms.  Baseline: HEP to be established Goal status: INITIAL        LONG TERM GOALS: Target date: 01/18/2023   Pt will score 67 on FOTO in order to demonstrate improved perception of function due to symptoms. Baseline: 63 Goal status: INITIAL   2. Pt will demonstrate at least 60 degrees of active cervical rotation ROM in order to demonstrate improved environmental awareness and safety with driving.  Baseline: see ROM chart above Goal status: INITIAL   3. Pt will demonstrate grossly symmetrical GH ER/IR MMT for improved symmetry of UE strength and improved tolerance to functional movements.  Baseline: see MMT chart above Goal status: INITIAL    4. Pt will demonstrate improved symmetry of lumbar ROM in order to promote improved tolerance to multiplanar movements for participation in recreational activities. Baseline: see ROM chart above Goal status: INITIAL    5. Pt will demonstrate independence w/ final prescribed HEP in order to facilitate improved self efficacy with management of symptoms.             Baseline: HEP TBD            Goal status: INITIAL  6. Pt will endorse greater than or equal to 50% reduction in pain severity over preceding week in order to facilitate improved tolerance to daily and recreational activities.             Baseline: 0-8/10 over past week            Goal status: INITIAL     PLAN:   PT FREQUENCY: 2x/week   PT DURATION: 8 weeks   PLANNED INTERVENTIONS: Therapeutic exercises, Therapeutic activity, Neuromuscular  re-education, Balance training, Gait training, Patient/Family education, Self Care, Joint mobilization, Stair training, DME instructions, Aquatic Therapy, Dry Needling, Electrical stimulation, Spinal mobilization, Cryotherapy, Moist heat, Taping, Manual therapy, and Re-evaluation   PLAN FOR NEXT SESSION: look at multiplanar movements with emphasis on core rotational stability given pt report of wanting to get back to golfing, pickleball. Manual PRN as indicated   Leeroy Cha PT, DPT 12/11/2022 11:39 AM

## 2022-12-12 ENCOUNTER — Ambulatory Visit (INDEPENDENT_AMBULATORY_CARE_PROVIDER_SITE_OTHER): Payer: Medicare HMO | Admitting: Licensed Clinical Social Worker

## 2022-12-12 DIAGNOSIS — F4321 Adjustment disorder with depressed mood: Secondary | ICD-10-CM

## 2022-12-12 DIAGNOSIS — R69 Illness, unspecified: Secondary | ICD-10-CM | POA: Diagnosis not present

## 2022-12-12 NOTE — Therapy (Signed)
OUTPATIENT PHYSICAL THERAPY TREATMENT NOTE   Patient Name: Kara Warren MRN: [id number] DOB:1956/06/01, 67 y.o., female Today's Date: 12/13/2022  PCP: Isaac Bliss, Rayford Halsted, MD   REFERRING PROVIDER: Thurman Coyer, DO  END OF SESSION:   PT End of Session - 12/13/22 1140     Visit Number 4    Number of Visits 17    Date for PT Re-Evaluation 01/18/23    Authorization Type aetna medicare    Authorization Time Period no auth    Progress Note Due on Visit 10    PT Start Time 1143    PT Stop Time 1225    PT Time Calculation (min) 42 min    Activity Tolerance Patient tolerated treatment well    Behavior During Therapy WFL for tasks assessed/performed               Past Medical History:  Diagnosis Date   Abdominal pain, left upper quadrant    Acute bronchospasm    Acute neck pain 07/02/2019   Acute sinusitis, unspecified 05/20/2009   Centricity Description: SINUSITIS - ACUTE-NOS Qualifier: Diagnosis of  By: Birdie Riddle MD, Belenda Cruise   Centricity Description: ACUTE SINUSITIS, UNSPECIFIED Qualifier: Diagnosis of  By: Alveta Heimlich MD, Eugene   Centricity Description: SINUSITIS, ACUTE Qualifier: Diagnosis of  By: Koleen Nimrod MD, Dellis Filbert     Anxiety    Atypical chest pain 03/05/2013   Body mass index (BMI) of 23.0-23.9 in adult    Cervicalgia    Colon polyp    Costochondral junction syndrome    Cough    DEPRESSION 08/25/2008   Qualifier: Diagnosis of  By: Ronnald Ramp CMA, Chemira     Diarrhea    Diverticulosis 07/27/2012   Dorsalgia    Dysrhythmia    hx a-fib   Elevated blood pressure reading without diagnosis of hypertension    Endometrial polyp 05/2007   benign   Essential hypertension 09/20/2020   Family history of osteoporosis 12/28/2011   Mother pt had normal Dexa 11/2009    Fatigue    GERD (gastroesophageal reflux disease)    HIP PAIN, RIGHT 04/12/2009   Qualifier: Diagnosis of  By: Oneida Alar MD, KARL     History of hiatal hernia    HYPERLIPIDEMIA 08/25/2008    Qualifier: Diagnosis of  By: Birdie Riddle MD, Belenda Cruise     IBS (irritable bowel syndrome)    Left hip pain 07/30/2013   Left wrist pain 07/30/2013   Lumbar strain, initial encounter 07/28/2019   LUNG NODULE 11/19/2009   Neg CT     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 Please review for accuracy.   Otalgia, unspecified ear    Pain in throat    Palpitations    Paroxysmal atrial fibrillation (HCC)    Peroneal tendinitis of right lower extremity 03/12/2014   Plantar wart 04/02/2014   Pneumonia    RHINITIS 06/21/2009   Qualifier: Diagnosis of  By: Birdie Riddle MD, Belenda Cruise     Right ankle injury, subsequent encounter  01/11/2018   Right foot pain 08/09/2011   Sinusitis, chronic    Skene's gland abscess 2009   patient unaware   Sprain of unspecified ligament of right ankle, subsequent encounter    TRANSAMINASES, SERUM, ELEVATED 12/07/2009   Qualifier: Diagnosis of  By: Inda Castle FNP, Melissa S    Unspecified cataract    Urinary tract infection, site not specified    Vitamin D deficiency    Past Surgical History:  Procedure Laterality Date   ATRIAL FIBRILLATION ABLATION N/A 08/01/2022   Procedure: ATRIAL FIBRILLATION ABLATION;  Surgeon: Constance Haw, MD;  Location: [facility];  Service: Cardiovascular;  Laterality: N/A;   BROW LIFT Bilateral 12/29/2021   Procedure: BLEPHAROPLASTY;  Surgeon: Lennice Sites, MD;  Location: [facility];  Service: Plastics;  Laterality: Bilateral;   COLONOSCOPY     x several    DILATION AND CURETTAGE OF UTERUS  05/2007   GYNECOLOGIC CRYOSURGERY  1980   HYSTEROSCOPY WITH D & C N/A 03/30/2017   Procedure: DILATATION & CURETTAGE/HYSTEROSCOPY WITH ULTRASOUND GUIDANCE;  Surgeon: Megan Salon, MD;  Location: [facility];  Service: Gynecology;  Laterality: N/A;   UPPER GI ENDOSCOPY     uterine ablation  2007   Patient Active Problem List   Diagnosis Date Noted   Acquired thrombophilia ([place]) 09/26/2021   Colonoscopy causing post-procedural bleeding 09/20/2020   Essential hypertension 09/20/2020   PAF (paroxysmal atrial fibrillation) ([place]) 09/20/2020   Plantar wart 04/02/2014   Peroneal tendinitis of right lower extremity 03/12/2014   Diverticulosis 07/27/2012   Sinusitis, chronic    Colon polyp    Menopause    Migraine    NONSPCIFC ABN FINDING RAD & OTH EXAM LUNG FIELD 11/19/2009   HIP PAIN, RIGHT 04/12/2009   Hyperlipidemia 08/25/2008   Depression 08/25/2008    REFERRING DIAG: M54.16 (ICD-10-CM) - Radiculopathy of lumbar region M54.2 (ICD-10-CM) - Neck pain M54.12 (ICD-10-CM) - Cervical radiculopathy  THERAPY DIAG:  Other low back pain  Cervicalgia  Muscle weakness (generalized)  Rationale for Evaluation and Treatment Rehabilitation  PERTINENT HISTORY: anxiety/depression, HTN, GERD, multiple MSK issues, Afib s/p ablation (all of above well controlled per discussion w/ pt)   PRECAUTIONS: none   PATIENT GOALS: would like to get back to more activities (golf, pickleball), remain active with less pain                                                                                                                                                                                SUBJECTIVE STATEMENT:   Pt reports some soreness after last session but overall doing well, primary complaint of R neck tightness. Feels injection has helped a lot with pain.  PAIN:  Are you having pain: neck "tightness" no pain, back no pain Location/description: L sided low back  occasionally to BIL belt line, neck L side and BIL Best-worst over past week: 0-8/10  Per eval -  - aggravating factors: prolonged positioning, sitting without support, more strenuous activities - Easing factors: rest, heat, epsom salt baths, lidocaine patches, medication   OBJECTIVE: (objective measures completed at initial evaluation unless otherwise dated)   DIAGNOSTIC FINDINGS:  Lumbar MRI 04/2022 IMPRESSION: 1. At L4-5 there is a mild broad-based disc bulge with a left paracentral disc protrusion resulting in mass effect on the left intraspinal L5 nerve root. Mild spinal stenosis. 2.  No acute osseous injury of the lumbar spine.  Cervical MRI 01/2022 IMPRESSION: 1. Unchanged severe right C5-6 neural foraminal stenosis secondary to disc osteophyte complex greatest in the right subarticular zone. 2. Unchanged small right foraminal protrusion at C3-4 with mild right neural foraminal stenosis. 3. No spinal canal stenosis. 4. Mild right C7 facet edema.   PATIENT SURVEYS:  FOTO 63 current, 67 predicted   COGNITION: Overall cognitive status: Within functional limits for tasks assessed   SENSATION/NEURO: Light touch intact all extremities   Finger<>nose testing unremarkable No clonus BIL  Negative hoffmann and tromner sign  No ataxia with gait     POSTURE: No Significant postural limitations   PALPATION: Concordant tenderness L suboccipitals, levator scap with trigger points throughout       CERVICAL ROM:    Active ROM A/PROM (deg) eval AROM 12/13/22  Flexion 100%   Extension 100%   Right lateral flexion 50% s   Left lateral flexion 50% s   Right rotation 44 deg s 51 deg  Left rotation 52 deg s 60 deg   (Blank rows = not tested) (Key: WFL = within functional limits not formally assessed, * = concordant pain, s = stiffness/stretching sensation, NT = not tested)  Comments: cervical mobility painless but does have L sided stiffness w/ B rotation     LUMBAR ROM:     Active  A/PROM  eval  Flexion 100% (touches floor)  Extension 75% *   Right lateral flexion 100% (below knee)  Left lateral flexion 75% just above knee)  Right rotation 100%  Left rotation 75%   (Blank rows = not tested) (Key: WFL = within functional limits not formally assessed, * = concordant pain, s = stiffness/stretching sensation, NT = not tested)  Comments:    UPPER EXTREMITY ROM:   A/PROM Right eval Left eval  Shoulder flexion    Shoulder abduction    Shoulder internal rotation    Shoulder external rotation    Elbow flexion      Elbow extension      Wrist flexion      Wrist extension       (Blank rows = not tested) (Key: WFL = within functional limits not formally assessed, * = concordant pain, s = stiffness/stretching sensation, NT = not tested)  Comments: flexion/abduction B UE WFL and symmetrical    UPPER EXTREMITY MMT:   MMT Right eval Left eval  Shoulder flexion 5 5  Shoulder extension      Shoulder abduction 5 5  Shoulder extension      Shoulder internal rotation 5 4+  Shoulder external rotation 5 4+  Elbow flexion      Elbow extension      Grip strength      (Blank rows = not tested)  (Key: WFL = within functional limits  not formally assessed, * = concordant pain, s = stiffness/stretching sensation, NT = not tested)  Comments:        TODAY'S TREATMENT:                                                                                               [facility] Adult PT Treatment:                                                DATE: 12/13/22 Therapeutic Exercise: Treadmill during subjective 5 min 1.0-2.56mh GTB diagonal pull aparts 2x12 cues for posture  GTB double ER + scapular retraction 2x12 cues for form and posture  13# paloff press x15 B cues for posture and arm positioning 7# paloff + OH press x10 B cues for posture and appropriate ROM  Cybex hip 45deg kickback 12.5# x12 B LE cues for LE positioning Cable column unilat press + trunk rotation 7# x10 B  cues for setup and form   Manual Therapy: Seated; STM R LS, UT, rhomboid. Active pin and stretch trigger point LS with cervical flexion 3x5     [facility] Adult PT Treatment:                                                DATE: 12/11/22  Therapeutic Exercise: Ellipitcal L5 x ramp Level 5 x 5 min  Standing calf stretch 2 x 30 sec gastroc Standing hamstring stretch 2 x 30sec ( 1x L/ R) Palloff press 2 x 15 with 13# Chop at the free motion 2 x x 10 with 13#, modified weight to 10# to help with proper form Scapular retraction Bil ER 2 x 15 PNF D1/ D2 with green TB bil UE 1 x 15 ea Standing mini squat with lateral band walking  Updated HEP for PNF D1/D2, scapular retraction with ER, sitting/ standing hamstring stretch Manual Therapy: Tack and stretch of the hamstring using tennis ball in seated position.    [facility]                                                DATE: 11/30/22 Therapeutic Exercise: 563m treadmill 1.42m69m1.8mph during subjective Bridge + adductor iso 2x12 cues for form, core activation, breath control  Chin tuck 2x12 cues for appropriate retraction/form, reduced compensations High>low row cable column 7#  x10, x10 with GTB in doorway for HEP  Manual Therapy: Seated STM L LS, UT, rhomboid; active pin and stretch L LS with cervical flexion x5 and rotation to R x5   Therapeutic Activity: Discussion re: walking program and monitoring symptom response with aim of facilitating improved tolerance and examining relevant variables    PATIENT EDUCATION:  Education details:rationale for  interventions Person educated: Patient Education method: Explanation, Demonstration, Tactile cues, Verbal cues, handout Education comprehension: verbalized understanding/agreement   HOME EXERCISE PROGRAM: Access Code: [email or web address] URL: [email or web address] Date: 12/11/2022 Prepared by: Starr Lake  Exercises - Seated Cervical Retraction  - 1 x daily - 7 x  weekly - 2 sets - 12 reps - Standing High Row with Resistance  - 1 x daily - 7 x weekly - 2 sets - 10 reps - Seated Hamstring Stretch  - 1-2 x daily - 7 x weekly - 2 sets - 2 reps - 30 hold - Standing Hamstring Stretch with Step  - 1-2 x daily - 7 x weekly - 2 sets - 2 reps - 30 hold - Shoulder PNF D2 with Resistance  - 1 x daily - 7 x weekly - 2 sets - 15 reps - Standing Single Arm Shoulder PNF D1 Extension with Anchored Resistance  - 1 x daily - 7 x weekly - 2 sets - 15 reps - Scapular retraction with ER (MONEY)  - 1 x daily - 7 x weekly - 2 sets - 15 reps - Side Stepping with Resistance at Feet  - 1 x daily - 7 x weekly - 2 sets - 10 reps   ASSESSMENT:   CLINICAL IMPRESSION: 12/13/2022 Pt arrives w/o pain, primary report of tightness. Today focusing on progression of cervical/periscapular stability training, core strengthening with emphasis on rotational control. Pt tolerates well with primary report of muscular fatigue, no increase in pain. Manual at end of session to mitigate post exercise soreness/stiffness. Noted improvement in cervical AROM compared to initial eval. No adverse events. Recommend continuing along current POC in order to address relevant deficits and improve functional tolerance. Pt departs today's session in no acute distress, all voiced questions/concerns addressed appropriately from PT perspective.    Eval - Patient is a very pleasant 67 y.o. woman who was seen today for physical therapy evaluation and treatment for chronic neck/back pain. Pain has been ongoing for several years with fluctuations, pt remains fairly active although she notes she has modified/limited activities to avoid exacerbations. Imaging as above, red flag screening reassuring. On exam pt demos concordant tenderness of L suboccipitals/LS with corresponding limitations in cervical mobility, lumbar mobility limitations in L sidebending and extension with reproduction of concordant low back pain. Pt tolerates  exam well and demos good GH strength although L RC musculature appears slightly weaker than R. Significant time spent w/ pt discussion/education re: activity participation, PT impairments/prognosis/POC. Recommend skilled PT to address aforementioned deficits with aim of maximizing participation in recreational exercise for improved overall health and improve tolerance to daily activities. No adverse events, pt departs today's session in no acute distress, all voiced questions/concerns addressed appropriately from PT perspective.      OBJECTIVE IMPAIRMENTS: decreased activity tolerance, decreased endurance, decreased ROM, decreased strength, hypomobility, increased muscle spasms, and pain.    ACTIVITY LIMITATIONS: lifting, bending, sitting, standing, stairs, and locomotion level   PARTICIPATION LIMITATIONS: community activity   PERSONAL FACTORS: Age, Time since onset of injury/illness/exacerbation, and 1-2 comorbidities: anxiety/depression, Afib  are also affecting patient's functional outcome.    REHAB POTENTIAL: Good   CLINICAL DECISION MAKING: Stable/uncomplicated   EVALUATION COMPLEXITY: Low     GOALS: Goals reviewed with patient? No   SHORT TERM GOALS: Target date: 12/21/2022   Pt will demonstrate appropriate understanding and performance of initially prescribed HEP in order to facilitate improved independence with management of symptoms.  Baseline: HEP to be established Goal status: INITIAL  LONG TERM GOALS: Target date: 01/18/2023   Pt will score 67 on FOTO in order to demonstrate improved perception of function due to symptoms. Baseline: 63 Goal status: INITIAL   2. Pt will demonstrate at least 60 degrees of active cervical rotation ROM in order to demonstrate improved environmental awareness and safety with driving.  Baseline: see ROM chart above Goal status: INITIAL   3. Pt will demonstrate grossly symmetrical GH ER/IR MMT for improved symmetry of UE strength and  improved tolerance to functional movements.  Baseline: see MMT chart above Goal status: INITIAL    4. Pt will demonstrate improved symmetry of lumbar ROM in order to promote improved tolerance to multiplanar movements for participation in recreational activities. Baseline: see ROM chart above Goal status: INITIAL    5. Pt will demonstrate independence w/ final prescribed HEP in order to facilitate improved self efficacy with management of symptoms.             Baseline: HEP TBD            Goal status: INITIAL   6. Pt will endorse greater than or equal to 50% reduction in pain severity over preceding week in order to facilitate improved tolerance to daily and recreational activities.             Baseline: 0-8/10 over past week            Goal status: INITIAL     PLAN:   PT FREQUENCY: 2x/week   PT DURATION: 8 weeks   PLANNED INTERVENTIONS: Therapeutic exercises, Therapeutic activity, Neuromuscular re-education, Balance training, Gait training, Patient/Family education, Self Care, Joint mobilization, Stair training, DME instructions, Aquatic Therapy, Dry Needling, Electrical stimulation, Spinal mobilization, Cryotherapy, Moist heat, Taping, Manual therapy, and Re-evaluation   PLAN FOR NEXT SESSION: look at multiplanar movements with emphasis on core rotational stability given pt report of wanting to get back to golfing, pickleball. Manual PRN as indicated     Leeroy Cha PT, DPT 12/13/2022 12:55 PM

## 2022-12-13 ENCOUNTER — Encounter: Payer: Self-pay | Admitting: Physical Therapy

## 2022-12-13 ENCOUNTER — Ambulatory Visit: Payer: Medicare HMO | Admitting: Internal Medicine

## 2022-12-13 ENCOUNTER — Ambulatory Visit: Payer: Medicare HMO | Admitting: Physical Therapy

## 2022-12-13 DIAGNOSIS — M542 Cervicalgia: Secondary | ICD-10-CM

## 2022-12-13 DIAGNOSIS — M62830 Muscle spasm of back: Secondary | ICD-10-CM | POA: Diagnosis not present

## 2022-12-13 DIAGNOSIS — M6281 Muscle weakness (generalized): Secondary | ICD-10-CM

## 2022-12-13 DIAGNOSIS — M5459 Other low back pain: Secondary | ICD-10-CM | POA: Diagnosis not present

## 2022-12-18 ENCOUNTER — Ambulatory Visit: Payer: Medicare HMO | Admitting: Physical Therapy

## 2022-12-18 DIAGNOSIS — L821 Other seborrheic keratosis: Secondary | ICD-10-CM | POA: Diagnosis not present

## 2022-12-18 DIAGNOSIS — L728 Other follicular cysts of the skin and subcutaneous tissue: Secondary | ICD-10-CM | POA: Diagnosis not present

## 2022-12-18 DIAGNOSIS — L814 Other melanin hyperpigmentation: Secondary | ICD-10-CM | POA: Diagnosis not present

## 2022-12-18 DIAGNOSIS — D225 Melanocytic nevi of trunk: Secondary | ICD-10-CM | POA: Diagnosis not present

## 2022-12-19 ENCOUNTER — Ambulatory Visit: Payer: Medicare HMO | Admitting: Internal Medicine

## 2022-12-19 ENCOUNTER — Encounter (HOSPITAL_COMMUNITY): Payer: Self-pay | Admitting: Licensed Clinical Social Worker

## 2022-12-19 NOTE — Progress Notes (Signed)
   THERAPIST PROGRESS NOTE  Session Time: 10:00am-10:55am  Participation Level: Active  Behavioral Response: Well GroomedAlertIrritable  Type of Therapy: Individual Therapy  Treatment Goals addressed: Yamira will identify patterns in relationships that are related to her level of self esteem and make choices more in line with higher levels of self esteem and self-compassion      ProgressTowards Goals: Progressing  Interventions: Motivational Interviewing  Summary: Kara Warren is a 67 y.o. female who presents with Adjustment disorder with depressed mood.   Suicidal/Homicidal: Nowithout intent/plan  Therapist Response: Mabrie engaged well in individual session with clinician. Clinician utilized MI OARS to reflect and summarize thoughts and feelings since last session. [name] processed updates with partner and her concerns about his attitude and communication skills in the relationship. Clinician explored challenges and discussed options for improving her communication skills. Clinician identified the importance of sharing her feelings without fear of ridicule or starting an argument. Clinician also discussed the value of having reasonable expectations for this partner. Clinician provided time and space for Patches to make decisions about her approach with partner and how she will let him know what she needs/expects.   Plan: Return again in 2-3 weeks.  Diagnosis: Adjustment disorder with depressed mood  Collaboration of Care: Patient refused AEB none required  Patient/Guardian was advised Release of Information must be obtained prior to any record release in order to collaborate their care with an outside provider. Patient/Guardian was advised if they have not already done so to contact the registration department to sign all necessary forms in order for [place] to release information regarding their care.   Consent: Patient/Guardian gives verbal consent for treatment and assignment of benefits  for services provided during this visit. Patient/Guardian expressed understanding and agreed to proceed.   Jobe Marker Hanson, LCSW 12/19/2022

## 2022-12-19 NOTE — Therapy (Signed)
OUTPATIENT PHYSICAL THERAPY TREATMENT NOTE   Patient Name: Kara Warren MRN: [id number] DOB:Oct 26, 1955, 67 y.o., female Today's Date: 12/22/2022  PCP: Isaac Bliss, Rayford Halsted, MD   REFERRING PROVIDER: Thurman Coyer, DO  END OF SESSION:   PT End of Session - 12/22/22 1014     Visit Number 5    Number of Visits 17    Date for PT Re-Evaluation 01/18/23    Authorization Type [employer] medicare    Authorization Time Period no auth    Progress Note Due on Visit 10    PT Start Time 1015    PT Stop Time 1100    PT Time Calculation (min) 45 min    Activity Tolerance Patient tolerated treatment well    Behavior During Therapy WFL for tasks assessed/performed              Past Medical History:  Diagnosis Date   Abdominal pain, left upper quadrant    Acute bronchospasm    Acute neck pain 07/02/2019   Acute sinusitis, unspecified 05/20/2009   Centricity Description: SINUSITIS - ACUTE-NOS Qualifier: Diagnosis of  By: Birdie Riddle MD, Belenda Cruise   Centricity Description: ACUTE SINUSITIS, UNSPECIFIED Qualifier: Diagnosis of  By: Alveta Heimlich MD, Eugene   Centricity Description: SINUSITIS, ACUTE Qualifier: Diagnosis of  By: Koleen Nimrod MD, Dellis Filbert     Anxiety    Atypical chest pain 03/05/2013   Body mass index (BMI) of 23.0-23.9 in adult    Cervicalgia    Colon polyp    Costochondral junction syndrome    Cough    DEPRESSION 08/25/2008   Qualifier: Diagnosis of  By: Ronnald Ramp CMA, Chemira     Diarrhea    Diverticulosis 07/27/2012   Dorsalgia    Dysrhythmia    hx a-fib   Elevated blood pressure reading without diagnosis of hypertension    Endometrial polyp 05/2007   benign   Essential hypertension 09/20/2020   Family history of osteoporosis 12/28/2011   Mother pt had normal Dexa 11/2009    Fatigue    GERD (gastroesophageal reflux disease)    HIP PAIN, RIGHT 04/12/2009   Qualifier: Diagnosis of  By: Oneida Alar MD, KARL     History of hiatal hernia    HYPERLIPIDEMIA 08/25/2008   Qualifier:  Diagnosis of  By: Birdie Riddle MD, Belenda Cruise     IBS (irritable bowel syndrome)    Left hip pain 07/30/2013   Left wrist pain 07/30/2013   Lumbar strain, initial encounter 07/28/2019   LUNG NODULE 11/19/2009   Neg CT     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 Please review for accuracy.   Otalgia, unspecified ear    Pain in throat    Palpitations    Paroxysmal atrial fibrillation (HCC)    Peroneal tendinitis of right lower extremity 03/12/2014   Plantar wart 04/02/2014   Pneumonia    RHINITIS 06/21/2009   Qualifier: Diagnosis of  By: Birdie Riddle MD, Katherine     Right ankle injury, subsequent encounter 01/11/2018  Right foot pain 08/09/2011   Sinusitis, chronic    Skene's gland abscess 2009   patient unaware   Sprain of unspecified ligament of right ankle, subsequent encounter    TRANSAMINASES, SERUM, ELEVATED 12/07/2009   Qualifier: Diagnosis of  By: Inda Castle FNP, Melissa S    Unspecified cataract    Urinary tract infection, site not specified    Vitamin D deficiency    Past Surgical History:  Procedure Laterality Date   ATRIAL FIBRILLATION ABLATION N/A 08/01/2022   Procedure: ATRIAL FIBRILLATION ABLATION;  Surgeon: Constance Haw, MD;  Location: [facility];  Service: Cardiovascular;  Laterality: N/A;   BROW LIFT Bilateral 12/29/2021   Procedure: BLEPHAROPLASTY;  Surgeon: Lennice Sites, MD;  Location: [facility];  Service: Plastics;  Laterality: Bilateral;   COLONOSCOPY     x several    DILATION AND CURETTAGE OF UTERUS  05/2007   GYNECOLOGIC CRYOSURGERY  1980   HYSTEROSCOPY WITH D & C N/A 03/30/2017   Procedure: DILATATION & CURETTAGE/HYSTEROSCOPY WITH ULTRASOUND GUIDANCE;  Surgeon: Megan Salon, MD;  Location: [facility];  Service: Gynecology;  Laterality: N/A;   UPPER GI ENDOSCOPY     uterine ablation  2007   Patient Active Problem List   Diagnosis Date Noted   Acquired thrombophilia ([place]) 09/26/2021   Colonoscopy causing post-procedural bleeding 09/20/2020   Essential hypertension 09/20/2020   PAF (paroxysmal atrial fibrillation) ([place]) 09/20/2020   Plantar wart 04/02/2014   Peroneal tendinitis of right lower extremity 03/12/2014   Diverticulosis 07/27/2012   Sinusitis, chronic    Colon polyp    Menopause    Migraine    NONSPCIFC ABN FINDING RAD & OTH EXAM LUNG FIELD 11/19/2009   HIP PAIN, RIGHT 04/12/2009   Hyperlipidemia 08/25/2008   Depression 08/25/2008    REFERRING DIAG: M54.16 (ICD-10-CM) - Radiculopathy of lumbar region M54.2 (ICD-10-CM) - Neck pain M54.12 (ICD-10-CM) - Cervical radiculopathy  THERAPY DIAG:  Other low back pain  Cervicalgia  Muscle weakness (generalized)  Rationale for Evaluation and Treatment Rehabilitation  PERTINENT HISTORY: anxiety/depression, HTN, GERD, multiple MSK issues, Afib s/p ablation (all of above well controlled per discussion w/ pt)   PRECAUTIONS: none   PATIENT GOALS: would like to get back to more activities (golf, pickleball), remain active with less pain                                                                                                                                                                                SUBJECTIVE STATEMENT:   No pain at present, just some neck stiffness. No other new updates, has some shoulder fatigue from pilates yesterday  PAIN:  Are you having pain: neck "tightness" no  pain, back no pain Location/description: L sided low back occasionally to BIL belt line, neck L  side and BIL Best-worst over past week: 0-3/10  (0-8/10 on eval)  Per eval -  - aggravating factors: prolonged positioning, sitting without support, more strenuous activities - Easing factors: rest, heat, epsom salt baths, lidocaine patches, medication   OBJECTIVE: (objective measures completed at initial evaluation unless otherwise dated)   DIAGNOSTIC FINDINGS:  Lumbar MRI 04/2022 IMPRESSION: 1. At L4-5 there is a mild broad-based disc bulge with a left paracentral disc protrusion resulting in mass effect on the left intraspinal L5 nerve root. Mild spinal stenosis. 2.  No acute osseous injury of the lumbar spine.  Cervical MRI 01/2022 IMPRESSION: 1. Unchanged severe right C5-6 neural foraminal stenosis secondary to disc osteophyte complex greatest in the right subarticular zone. 2. Unchanged small right foraminal protrusion at C3-4 with mild right neural foraminal stenosis. 3. No spinal canal stenosis. 4. Mild right C7 facet edema.   PATIENT SURVEYS:  FOTO 63 current, 67 predicted   COGNITION: Overall cognitive status: Within functional limits for tasks assessed   SENSATION/NEURO: Light touch intact all extremities   Finger<>nose testing unremarkable No clonus BIL  Negative hoffmann and tromner sign  No ataxia with gait     POSTURE: No Significant postural limitations   PALPATION: Concordant tenderness L suboccipitals, levator scap with trigger points throughout       CERVICAL ROM:    Active ROM A/PROM (deg) eval AROM 12/13/22  Flexion 100%   Extension 100%   Right lateral flexion 50% s   Left lateral flexion 50% s   Right rotation 44 deg s 51 deg  Left rotation 52 deg s 60 deg   (Blank rows = not tested) (Key: WFL = within functional limits not formally assessed, * = concordant pain, s = stiffness/stretching sensation, NT = not tested)  Comments: cervical mobility painless but does have L sided stiffness w/ B rotation     LUMBAR ROM:    Active  A/PROM   eval  Flexion 100% (touches floor)  Extension 75% *   Right lateral flexion 100% (below knee)  Left lateral flexion 75% just above knee)  Right rotation 100%  Left rotation 75%   (Blank rows = not tested) (Key: WFL = within functional limits not formally assessed, * = concordant pain, s = stiffness/stretching sensation, NT = not tested)  Comments:    UPPER EXTREMITY ROM:   A/PROM Right eval Left eval  Shoulder flexion    Shoulder abduction    Shoulder internal rotation    Shoulder external rotation    Elbow flexion      Elbow extension      Wrist flexion      Wrist extension       (Blank rows = not tested) (Key: WFL = within functional limits not formally assessed, * = concordant pain, s = stiffness/stretching sensation, NT = not tested)  Comments: flexion/abduction B UE WFL and symmetrical    UPPER EXTREMITY MMT:   MMT Right eval Left eval  Shoulder flexion 5 5  Shoulder extension      Shoulder abduction 5 5  Shoulder extension      Shoulder internal rotation 5 4+  Shoulder external rotation 5 4+  Elbow flexion      Elbow extension      Grip strength      (Blank rows = not tested)  (Key: WFL = within functional limits not formally assessed, * =  concordant pain, s = stiffness/stretching sensation, NT = not tested)  Comments:        TODAY'S TREATMENT:                                                                                               [facility] Adult PT Treatment:                                                DATE: 12/22/22 Therapeutic Exercise: 6# farmer's carry 334ft  Seated machine row 20# 3x10 cues for posture CC paloff press 10# 2x8 BIL Bent row 10# 2x12 BIL cues for form and neutral trunk posture throughout Hip ext + diagonal GTB x10 BIL LE cues for setup, UE support at trunk GTB BIL scaption 2x10 cues for pacing and appropriate ROM Manual Therapy: Seated; STM BIL LS, UT, rhomboids, thoracic paraspinals. Trigger point release throughout, active pin  and stretch BIL rhomboids and thoracic paraspinals w/ cervical flexion 2x5     [facility] Adult PT Treatment:                                                DATE: 12/13/22 Therapeutic Exercise: Treadmill during subjective 5 min 1.0-2.31mph GTB diagonal pull aparts 2x12 cues for posture  GTB double ER + scapular retraction 2x12 cues for form and posture  13# paloff press x15 B cues for posture and arm positioning 7# paloff + OH press x10 B cues for posture and appropriate ROM  Cybex hip 45deg kickback 12.5# x12 B LE cues for LE positioning Cable column unilat press + trunk rotation 7# x10 B cues for setup and form   Manual Therapy: Seated; STM R LS, UT, rhomboid. Active pin and stretch trigger point LS with cervical flexion 3x5     [facility] Adult PT Treatment:                                                DATE: 12/11/22  Therapeutic Exercise: Ellipitcal L5 x ramp Level 5 x 5 min  Standing calf stretch 2 x 30 sec gastroc Standing hamstring stretch 2 x 30sec ( 1x L/ R) Palloff press 2 x 15 with 13# Chop at the free motion 2 x x 10 with 13#, modified weight to 10# to help with proper form Scapular retraction Bil ER 2 x 15 PNF D1/ D2 with green TB bil UE 1 x 15 ea Standing mini squat with lateral band walking  Updated HEP for PNF D1/D2, scapular retraction with ER, sitting/ standing hamstring stretch Manual Therapy: Tack and stretch of the hamstring using tennis ball in seated position.    PATIENT EDUCATION:  Education details:rationale for interventions Person educated: Patient Education method: Explanation,  Demonstration, Tactile cues, Verbal cues, handout Education comprehension: verbalized understanding/agreement   HOME EXERCISE PROGRAM: Access Code: [email or web address] URL: [email or web address] Date: 12/11/2022 Prepared by: Starr Lake  Exercises - Seated Cervical Retraction  - 1 x daily - 7 x weekly - 2 sets - 12 reps - Standing High Row with Resistance  - 1 x daily -  7 x weekly - 2 sets - 10 reps - Seated Hamstring Stretch  - 1-2 x daily - 7 x weekly - 2 sets - 2 reps - 30 hold - Standing Hamstring Stretch with Step  - 1-2 x daily - 7 x weekly - 2 sets - 2 reps - 30 hold - Shoulder PNF D2 with Resistance  - 1 x daily - 7 x weekly - 2 sets - 15 reps - Standing Single Arm Shoulder PNF D1 Extension with Anchored Resistance  - 1 x daily - 7 x weekly - 2 sets - 15 reps - Scapular retraction with ER (MONEY)  - 1 x daily - 7 x weekly - 2 sets - 15 reps - Side Stepping with Resistance at Feet  - 1 x daily - 7 x weekly - 2 sets - 10 reps   ASSESSMENT:   CLINICAL IMPRESSION: 12/22/2022 Pt arrives w/o pain, primary report of tightness and R shoulder fatigue from pilates yesterday. Today focusing on progression of postural endurance training, incorporation of more periscapular/core training. Intermittent cues as above, emphasis on postural extension. Primary report of fatigue, no increase in pain. Palpable tightness throughout postural musculature that improves w/ manual at end of session. No adverse events, pt tolerates session well overall. Recommend continuing along current POC in order to address relevant deficits and improve functional tolerance. Pt departs today's session in no acute distress, all voiced questions/concerns addressed appropriately from PT perspective.    Eval - Patient is a very pleasant 67 y.o. woman who was seen today for physical therapy evaluation and treatment for chronic neck/back pain. Pain has been ongoing for several years with fluctuations, pt remains fairly active although she notes she has modified/limited activities to avoid exacerbations. Imaging as above, red flag screening reassuring. On exam pt demos concordant tenderness of L suboccipitals/LS with corresponding limitations in cervical mobility, lumbar mobility limitations in L sidebending and extension with reproduction of concordant low back pain. Pt tolerates exam well and demos good GH  strength although L RC musculature appears slightly weaker than R. Significant time spent w/ pt discussion/education re: activity participation, PT impairments/prognosis/POC. Recommend skilled PT to address aforementioned deficits with aim of maximizing participation in recreational exercise for improved overall health and improve tolerance to daily activities. No adverse events, pt departs today's session in no acute distress, all voiced questions/concerns addressed appropriately from PT perspective.      OBJECTIVE IMPAIRMENTS: decreased activity tolerance, decreased endurance, decreased ROM, decreased strength, hypomobility, increased muscle spasms, and pain.    ACTIVITY LIMITATIONS: lifting, bending, sitting, standing, stairs, and locomotion level   PARTICIPATION LIMITATIONS: community activity   PERSONAL FACTORS: Age, Time since onset of injury/illness/exacerbation, and 1-2 comorbidities: anxiety/depression, Afib  are also affecting patient's functional outcome.    REHAB POTENTIAL: Good   CLINICAL DECISION MAKING: Stable/uncomplicated   EVALUATION COMPLEXITY: Low     GOALS: Goals reviewed with patient? No   SHORT TERM GOALS: Target date: 12/21/2022   Pt will demonstrate appropriate understanding and performance of initially prescribed HEP in order to facilitate improved independence with management of symptoms.  Baseline: HEP to be established 12/22/22: Pt endorses  good adherence to home exercises Goal status: MET    LONG TERM GOALS: Target date: 01/18/2023   Pt will score 67 on FOTO in order to demonstrate improved perception of function due to symptoms. Baseline: 63 Goal status: INITIAL   2. Pt will demonstrate at least 60 degrees of active cervical rotation ROM in order to demonstrate improved environmental awareness and safety with driving.  Baseline: see ROM chart above Goal status: INITIAL   3. Pt will demonstrate grossly symmetrical GH ER/IR MMT for improved symmetry of  UE strength and improved tolerance to functional movements.  Baseline: see MMT chart above Goal status: INITIAL    4. Pt will demonstrate improved symmetry of lumbar ROM in order to promote improved tolerance to multiplanar movements for participation in recreational activities. Baseline: see ROM chart above Goal status: INITIAL    5. Pt will demonstrate independence w/ final prescribed HEP in order to facilitate improved self efficacy with management of symptoms.             Baseline: HEP TBD            Goal status: INITIAL   6. Pt will endorse greater than or equal to 50% reduction in pain severity over preceding week in order to facilitate improved tolerance to daily and recreational activities.             Baseline: 0-8/10 over past week            Goal status: INITIAL     PLAN:   PT FREQUENCY: 2x/week   PT DURATION: 8 weeks   PLANNED INTERVENTIONS: Therapeutic exercises, Therapeutic activity, Neuromuscular re-education, Balance training, Gait training, Patient/Family education, Self Care, Joint mobilization, Stair training, DME instructions, Aquatic Therapy, Dry Needling, Electrical stimulation, Spinal mobilization, Cryotherapy, Moist heat, Taping, Manual therapy, and Re-evaluation   PLAN FOR NEXT SESSION: look at multiplanar movements with emphasis on core rotational stability given pt report of wanting to get back to golfing, pickleball. Manual PRN as indicated      Leeroy Cha PT, DPT 12/22/2022 12:03 PM

## 2022-12-20 ENCOUNTER — Other Ambulatory Visit: Payer: Medicare HMO

## 2022-12-20 DIAGNOSIS — K5730 Diverticulosis of large intestine without perforation or abscess without bleeding: Secondary | ICD-10-CM | POA: Diagnosis not present

## 2022-12-20 DIAGNOSIS — K648 Other hemorrhoids: Secondary | ICD-10-CM | POA: Diagnosis not present

## 2022-12-20 DIAGNOSIS — Z86010 Personal history of colonic polyps: Secondary | ICD-10-CM | POA: Diagnosis not present

## 2022-12-20 DIAGNOSIS — Z1211 Encounter for screening for malignant neoplasm of colon: Secondary | ICD-10-CM | POA: Diagnosis not present

## 2022-12-20 LAB — HM COLONOSCOPY

## 2022-12-21 ENCOUNTER — Ambulatory Visit: Payer: Medicare HMO | Admitting: Physical Therapy

## 2022-12-21 DIAGNOSIS — R69 Illness, unspecified: Secondary | ICD-10-CM | POA: Diagnosis not present

## 2022-12-22 ENCOUNTER — Ambulatory Visit: Payer: Medicare HMO | Admitting: Physical Therapy

## 2022-12-22 ENCOUNTER — Encounter: Payer: Self-pay | Admitting: Physical Therapy

## 2022-12-22 DIAGNOSIS — M6281 Muscle weakness (generalized): Secondary | ICD-10-CM | POA: Diagnosis not present

## 2022-12-22 DIAGNOSIS — M62830 Muscle spasm of back: Secondary | ICD-10-CM | POA: Diagnosis not present

## 2022-12-22 DIAGNOSIS — M5459 Other low back pain: Secondary | ICD-10-CM | POA: Diagnosis not present

## 2022-12-22 DIAGNOSIS — M542 Cervicalgia: Secondary | ICD-10-CM

## 2022-12-25 ENCOUNTER — Other Ambulatory Visit: Payer: Self-pay

## 2022-12-25 ENCOUNTER — Ambulatory Visit: Payer: Medicare HMO | Admitting: Physical Therapy

## 2022-12-26 ENCOUNTER — Other Ambulatory Visit: Payer: Self-pay

## 2022-12-26 NOTE — Therapy (Signed)
OUTPATIENT PHYSICAL THERAPY TREATMENT NOTE   Patient Name: BINNIE ORTEZ MRN: [id number] DOB:September 23, 1956, 67 y.o., female Today's Date: 12/27/2022  PCP: Isaac Bliss, Rayford Halsted, MD   REFERRING PROVIDER: Thurman Coyer, DO  END OF SESSION:   PT End of Session - 12/27/22 1013     Visit Number 6    Number of Visits 17    Date for PT Re-Evaluation 01/18/23    Authorization Type [employer] medicare    Authorization Time Period no auth    Progress Note Due on Visit 10    PT Start Time 1014    PT Stop Time 1057    PT Time Calculation (min) 43 min    Activity Tolerance Patient tolerated treatment well    Behavior During Therapy WFL for tasks assessed/performed             Past Medical History:  Diagnosis Date   Abdominal pain, left upper quadrant    Acute bronchospasm    Acute neck pain 07/02/2019   Acute sinusitis, unspecified 05/20/2009   Centricity Description: SINUSITIS - ACUTE-NOS Qualifier: Diagnosis of  By: Birdie Riddle MD, Belenda Cruise   Centricity Description: ACUTE SINUSITIS, UNSPECIFIED Qualifier: Diagnosis of  By: Alveta Heimlich MD, Eugene   Centricity Description: SINUSITIS, ACUTE Qualifier: Diagnosis of  By: Koleen Nimrod MD, Dellis Filbert     Anxiety    Atypical chest pain 03/05/2013   Body mass index (BMI) of 23.0-23.9 in adult    Cervicalgia    Colon polyp    Costochondral junction syndrome    Cough    DEPRESSION 08/25/2008   Qualifier: Diagnosis of  By: Ronnald Ramp CMA, Chemira     Diarrhea    Diverticulosis 07/27/2012   Dorsalgia    Dysrhythmia    hx a-fib   Elevated blood pressure reading without diagnosis of hypertension    Endometrial polyp 05/2007   benign   Essential hypertension 09/20/2020   Family history of osteoporosis 12/28/2011   Mother pt had normal Dexa 11/2009    Fatigue    GERD (gastroesophageal reflux disease)    HIP PAIN, RIGHT 04/12/2009   Qualifier: Diagnosis of  By: Oneida Alar MD, KARL     History of hiatal hernia    HYPERLIPIDEMIA 08/25/2008   Qualifier:  Diagnosis of  By: Birdie Riddle MD, Belenda Cruise     IBS (irritable bowel syndrome)    Left hip pain 07/30/2013   Left wrist pain 07/30/2013   Lumbar strain, initial encounter 07/28/2019   LUNG NODULE 11/19/2009   Neg CT     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 Please review for accuracy.   Otalgia, unspecified ear    Pain in throat    Palpitations    Paroxysmal atrial fibrillation (HCC)    Peroneal tendinitis of right lower extremity 03/12/2014   Plantar wart 04/02/2014   Pneumonia    RHINITIS 06/21/2009   Qualifier: Diagnosis of  By: Birdie Riddle MD, Katherine     Right ankle injury, subsequent encounter 01/11/2018  Right foot pain 08/09/2011   Sinusitis, chronic    Skene's gland abscess 2009   patient unaware   Sprain of unspecified ligament of right ankle, subsequent encounter    TRANSAMINASES, SERUM, ELEVATED 12/07/2009   Qualifier: Diagnosis of  By: Inda Castle FNP, Melissa S    Unspecified cataract    Urinary tract infection, site not specified    Vitamin D deficiency    Past Surgical History:  Procedure Laterality Date   ATRIAL FIBRILLATION ABLATION N/A 08/01/2022   Procedure: ATRIAL FIBRILLATION ABLATION;  Surgeon: Constance Haw, MD;  Location: [facility];  Service: Cardiovascular;  Laterality: N/A;   BROW LIFT Bilateral 12/29/2021   Procedure: BLEPHAROPLASTY;  Surgeon: Lennice Sites, MD;  Location: [facility];  Service: Plastics;  Laterality: Bilateral;   COLONOSCOPY     x several    DILATION AND CURETTAGE OF UTERUS  05/2007   GYNECOLOGIC CRYOSURGERY  1980   HYSTEROSCOPY WITH D & C N/A 03/30/2017   Procedure: DILATATION & CURETTAGE/HYSTEROSCOPY WITH ULTRASOUND GUIDANCE;  Surgeon: Megan Salon, MD;  Location: [facility];  Service: Gynecology;  Laterality: N/A;   UPPER GI ENDOSCOPY     uterine ablation  2007   Patient Active Problem List   Diagnosis Date Noted   Acquired thrombophilia ([place]) 09/26/2021   Colonoscopy causing post-procedural bleeding 09/20/2020   Essential hypertension 09/20/2020   PAF (paroxysmal atrial fibrillation) ([place]) 09/20/2020   Plantar wart 04/02/2014   Peroneal tendinitis of right lower extremity 03/12/2014   Diverticulosis 07/27/2012   Sinusitis, chronic    Colon polyp    Menopause    Migraine    NONSPCIFC ABN FINDING RAD & OTH EXAM LUNG FIELD 11/19/2009   HIP PAIN, RIGHT 04/12/2009   Hyperlipidemia 08/25/2008   Depression 08/25/2008    REFERRING DIAG: M54.16 (ICD-10-CM) - Radiculopathy of lumbar region M54.2 (ICD-10-CM) - Neck pain M54.12 (ICD-10-CM) - Cervical radiculopathy  THERAPY DIAG:  Other low back pain  Cervicalgia  Muscle weakness (generalized)  Rationale for Evaluation and Treatment Rehabilitation  PERTINENT HISTORY: anxiety/depression, HTN, GERD, multiple MSK issues, Afib s/p ablation (all of above well controlled per discussion w/ pt)   PRECAUTIONS: none   PATIENT GOALS: would like to get back to more activities (golf, pickleball), remain active with less pain                                                                                                                                                                                SUBJECTIVE STATEMENT:   Pt arrives without pain, primary report of stiffness. No issues after last session  PAIN:  Are you having pain: neck "tightness" no pain, back no pain Location/description: L  sided low back occasionally to BIL belt line, neck L side and BIL Best-worst over  past week: 0-3/10  (0-8/10 on eval)  Per eval -  - aggravating factors: prolonged positioning, sitting without support, more strenuous activities - Easing factors: rest, heat, epsom salt baths, lidocaine patches, medication   OBJECTIVE: (objective measures completed at initial evaluation unless otherwise dated)   DIAGNOSTIC FINDINGS:  Lumbar MRI 04/2022 IMPRESSION: 1. At L4-5 there is a mild broad-based disc bulge with a left paracentral disc protrusion resulting in mass effect on the left intraspinal L5 nerve root. Mild spinal stenosis. 2.  No acute osseous injury of the lumbar spine.  Cervical MRI 01/2022 IMPRESSION: 1. Unchanged severe right C5-6 neural foraminal stenosis secondary to disc osteophyte complex greatest in the right subarticular zone. 2. Unchanged small right foraminal protrusion at C3-4 with mild right neural foraminal stenosis. 3. No spinal canal stenosis. 4. Mild right C7 facet edema.   PATIENT SURVEYS:  FOTO 63 current, 67 predicted 12/27/22: FOTO neck 76, FOTO lumbar 70   COGNITION: Overall cognitive status: Within functional limits for tasks assessed   SENSATION/NEURO: Light touch intact all extremities   Finger<>nose testing unremarkable No clonus BIL  Negative hoffmann and tromner sign  No ataxia with gait     POSTURE: No Significant postural limitations   PALPATION: Concordant tenderness L suboccipitals, levator scap with trigger points throughout       CERVICAL ROM:    Active ROM A/PROM (deg) eval AROM 12/13/22  Flexion 100%   Extension 100%   Right lateral flexion 50% s   Left lateral flexion 50% s   Right rotation 44 deg s 51 deg  Left rotation 52 deg s 60 deg   (Blank rows = not tested) (Key: WFL = within functional limits not formally assessed, * = concordant pain, s = stiffness/stretching sensation, NT = not tested)  Comments: cervical mobility painless but does have L sided stiffness w/ B rotation     LUMBAR ROM:    Active   A/PROM  eval  Flexion 100% (touches floor)  Extension 75% *   Right lateral flexion 100% (below knee)  Left lateral flexion 75% just above knee)  Right rotation 100%  Left rotation 75%   (Blank rows = not tested) (Key: WFL = within functional limits not formally assessed, * = concordant pain, s = stiffness/stretching sensation, NT = not tested)  Comments:    UPPER EXTREMITY ROM:   A/PROM Right eval Left eval  Shoulder flexion    Shoulder abduction    Shoulder internal rotation    Shoulder external rotation    Elbow flexion      Elbow extension      Wrist flexion      Wrist extension       (Blank rows = not tested) (Key: WFL = within functional limits not formally assessed, * = concordant pain, s = stiffness/stretching sensation, NT = not tested)  Comments: flexion/abduction B UE WFL and symmetrical    UPPER EXTREMITY MMT:   MMT Right eval Left eval  Shoulder flexion 5 5  Shoulder extension      Shoulder abduction 5 5  Shoulder extension      Shoulder internal rotation 5 4+  Shoulder external rotation 5 4+  Elbow flexion      Elbow extension      Grip strength      (Blank rows = not tested)  (Key: WFL = within functional limits not formally assessed, * =  concordant pain, s = stiffness/stretching sensation, NT = not tested)  Comments:        TODAY'S TREATMENT:                                                                                               [facility] Adult PT Treatment:                                                DATE: 12/27/22 Therapeutic Exercise: 5# DB waiter carry BIL 2x149ft 10# suitcase carry BIL 2x160ft  3# staggered stance low>high press unilat performed BIL, 2x12 cues for form, concentric velocity and eccentric control  Post delt fly at cable column, 2x12 3# BIL cues for posture and form  Manual Therapy: Seated: STM and trigger point release BIL rhomboids, UT, thoracic paraspinals, deltoids. Emphasis R>L    [facility] Adult PT Treatment:                                                 DATE: 12/22/22 Therapeutic Exercise: 6# farmer's carry 3108ft  Seated machine row 20# 3x10 cues for posture CC paloff press 10# 2x8 BIL Bent row 10# 2x12 BIL cues for form and neutral trunk posture throughout Hip ext + diagonal GTB x10 BIL LE cues for setup, UE support at trunk GTB BIL scaption 2x10 cues for pacing and appropriate ROM Manual Therapy: Seated; STM BIL LS, UT, rhomboids, thoracic paraspinals. Trigger point release throughout, active pin and stretch BIL rhomboids and thoracic paraspinals w/ cervical flexion 2x5     [facility] Adult PT Treatment:                                                DATE: 12/13/22 Therapeutic Exercise: Treadmill during subjective 5 min 1.0-2.67mph GTB diagonal pull aparts 2x12 cues for posture  GTB double ER + scapular retraction 2x12 cues for form and posture  13# paloff press x15 B cues for posture and arm positioning 7# paloff + OH press x10 B cues for posture and appropriate ROM  Cybex hip 45deg kickback 12.5# x12 B LE cues for LE positioning Cable column unilat press + trunk rotation 7# x10 B cues for setup and form   Manual Therapy: Seated; STM R LS, UT, rhomboid. Active pin and stretch trigger point LS with cervical flexion 3x5     PATIENT EDUCATION:  Education details:rationale for interventions Person educated: Patient Education method: Explanation, Demonstration, Tactile cues, Verbal cues, handout Education comprehension: verbalized understanding/agreement   HOME EXERCISE PROGRAM: Access Code: [email or web address] URL: [email or web address] Date: 12/11/2022 (provided with blue band on 12/27/22 to progress) Prepared by: Starr Lake  Exercises - Seated Cervical Retraction  - 1 x daily - 7 x weekly -  2 sets - 12 reps - Standing High Row with Resistance  - 1 x daily - 7 x weekly - 2 sets - 10 reps - Seated Hamstring Stretch  - 1-2 x daily - 7 x weekly - 2 sets - 2 reps - 30 hold - Standing  Hamstring Stretch with Step  - 1-2 x daily - 7 x weekly - 2 sets - 2 reps - 30 hold - Shoulder PNF D2 with Resistance  - 1 x daily - 7 x weekly - 2 sets - 15 reps - Standing Single Arm Shoulder PNF D1 Extension with Anchored Resistance  - 1 x daily - 7 x weekly - 2 sets - 15 reps - Scapular retraction with ER (MONEY)  - 1 x daily - 7 x weekly - 2 sets - 15 reps - Side Stepping with Resistance at Feet  - 1 x daily - 7 x weekly - 2 sets - 10 reps   ASSESSMENT:   CLINICAL IMPRESSION: 12/27/2022 Pt arrives w/o overt pain, primary report of tightness. FOTO scored today, pt has met goals for both neck and lumbar portions. Today continued to progress postural strengthening/endurance exercises with increased emphasis on rotational/sport simulated movements. Pt tolerates well with muscular fatigue, no increase in pain. Pt does verbalize interest in more engaging/competitive tasks, will plan to incorporate more sport specific tasks going forward, particularly with pickleball. No adverse events, pt reports reduced stiffness post manual. Pt departs today's session in no acute distress, all voiced questions/concerns addressed appropriately from PT perspective.    Eval - Patient is a very pleasant 67 y.o. woman who was seen today for physical therapy evaluation and treatment for chronic neck/back pain. Pain has been ongoing for several years with fluctuations, pt remains fairly active although she notes she has modified/limited activities to avoid exacerbations. Imaging as above, red flag screening reassuring. On exam pt demos concordant tenderness of L suboccipitals/LS with corresponding limitations in cervical mobility, lumbar mobility limitations in L sidebending and extension with reproduction of concordant low back pain. Pt tolerates exam well and demos good GH strength although L RC musculature appears slightly weaker than R. Significant time spent w/ pt discussion/education re: activity participation, PT  impairments/prognosis/POC. Recommend skilled PT to address aforementioned deficits with aim of maximizing participation in recreational exercise for improved overall health and improve tolerance to daily activities. No adverse events, pt departs today's session in no acute distress, all voiced questions/concerns addressed appropriately from PT perspective.      OBJECTIVE IMPAIRMENTS: decreased activity tolerance, decreased endurance, decreased ROM, decreased strength, hypomobility, increased muscle spasms, and pain.    ACTIVITY LIMITATIONS: lifting, bending, sitting, standing, stairs, and locomotion level   PARTICIPATION LIMITATIONS: community activity   PERSONAL FACTORS: Age, Time since onset of injury/illness/exacerbation, and 1-2 comorbidities: anxiety/depression, Afib  are also affecting patient's functional outcome.    REHAB POTENTIAL: Good   CLINICAL DECISION MAKING: Stable/uncomplicated   EVALUATION COMPLEXITY: Low     GOALS: Goals reviewed with patient? No   SHORT TERM GOALS: Target date: 12/21/2022   Pt will demonstrate appropriate understanding and performance of initially prescribed HEP in order to facilitate improved independence with management of symptoms.  Baseline: HEP to be established 12/22/22: Pt endorses good adherence to home exercises Goal status: MET    LONG TERM GOALS: Target date: 01/18/2023   Pt will score 67 on FOTO in order to demonstrate improved perception of function due to symptoms. Baseline: 63 Goal status: INITIAL   2. Pt will  demonstrate at least 60 degrees of active cervical rotation ROM in order to demonstrate improved environmental awareness and safety with driving.  Baseline: see ROM chart above Goal status: INITIAL   3. Pt will demonstrate grossly symmetrical GH ER/IR MMT for improved symmetry of UE strength and improved tolerance to functional movements.  Baseline: see MMT chart above Goal status: INITIAL    4. Pt will demonstrate  improved symmetry of lumbar ROM in order to promote improved tolerance to multiplanar movements for participation in recreational activities. Baseline: see ROM chart above Goal status: INITIAL    5. Pt will demonstrate independence w/ final prescribed HEP in order to facilitate improved self efficacy with management of symptoms.             Baseline: HEP TBD            Goal status: INITIAL   6. Pt will endorse greater than or equal to 50% reduction in pain severity over preceding week in order to facilitate improved tolerance to daily and recreational activities.             Baseline: 0-8/10 over past week            Goal status: INITIAL     PLAN:   PT FREQUENCY: 2x/week   PT DURATION: 8 weeks   PLANNED INTERVENTIONS: Therapeutic exercises, Therapeutic activity, Neuromuscular re-education, Balance training, Gait training, Patient/Family education, Self Care, Joint mobilization, Stair training, DME instructions, Aquatic Therapy, Dry Needling, Electrical stimulation, Spinal mobilization, Cryotherapy, Moist heat, Taping, Manual therapy, and Re-evaluation   PLAN FOR NEXT SESSION: look at multiplanar movements with emphasis on core rotational stability given pt report of wanting to get back to golfing, pickleball. Manual PRN as indicated       Leeroy Cha PT, DPT 12/27/2022 12:41 PM

## 2022-12-27 ENCOUNTER — Other Ambulatory Visit (HOSPITAL_COMMUNITY): Payer: Self-pay

## 2022-12-27 ENCOUNTER — Ambulatory Visit: Payer: Medicare HMO | Admitting: Physical Therapy

## 2022-12-27 ENCOUNTER — Encounter: Payer: Self-pay | Admitting: Physical Therapy

## 2022-12-27 DIAGNOSIS — M6281 Muscle weakness (generalized): Secondary | ICD-10-CM | POA: Diagnosis not present

## 2022-12-27 DIAGNOSIS — M542 Cervicalgia: Secondary | ICD-10-CM | POA: Diagnosis not present

## 2022-12-27 DIAGNOSIS — M5459 Other low back pain: Secondary | ICD-10-CM

## 2022-12-27 DIAGNOSIS — M62830 Muscle spasm of back: Secondary | ICD-10-CM | POA: Diagnosis not present

## 2023-01-01 NOTE — Therapy (Addendum)
OUTPATIENT PHYSICAL THERAPY TREATMENT NOTE + NO VISIT DISCHARGE (see below)   Patient Name: Kara Warren MRN: [id number] DOB:[birth year], 67 y.o., female Today's Date: 01/02/2023  PCP: Philip Aspen, Limmie Patricia, MD   REFERRING PROVIDER: Ralene Cork, DO  END OF SESSION:   PT End of Session - 01/02/23 1459     Visit Number 7    Number of Visits 17    Date for PT Re-Evaluation 01/18/23    Authorization Type [employer]na medicare    Authorization Time Period no auth    Progress Note Due on Visit 10    PT Start Time 1500    PT Stop Time 1540    PT Time Calculation (min) 40 min    Activity Tolerance Patient tolerated treatment well    Behavior During Therapy WFL for tasks assessed/performed              Past Medical History:  Diagnosis Date   Abdominal pain, left upper quadrant    Acute bronchospasm    Acute neck pain 07/02/2019   Acute sinusitis, unspecified 05/20/2009   Centricity Description: SINUSITIS - ACUTE-NOS Qualifier: Diagnosis of  By: Beverely Low MD, Natalia Leatherwood   Centricity Description: ACUTE SINUSITIS, UNSPECIFIED Qualifier: Diagnosis of  By: Thurmond Butts MD, Eugene   Centricity Description: SINUSITIS, ACUTE Qualifier: Diagnosis of  By: Orson Aloe MD, Tinnie Gens     Anxiety    Atypical chest pain 03/05/2013   Body mass index (BMI) of 23.0-23.9 in adult    Cervicalgia    Colon polyp    Costochondral junction syndrome    Cough    DEPRESSION 08/25/2008   Qualifier: Diagnosis of  By: Yetta Barre CMA, Chemira     Diarrhea    Diverticulosis 07/27/2012   Dorsalgia    Dysrhythmia    hx a-fib   Elevated blood pressure reading without diagnosis of hypertension    Endometrial polyp 05/2007   benign   Essential hypertension 09/20/2020   Family history of osteoporosis 12/28/2011   Mother pt had normal Dexa 11/2009    Fatigue    GERD (gastroesophageal reflux disease)    HIP PAIN, RIGHT 04/12/2009   Qualifier: Diagnosis of  By: Darrick Penna MD, KARL     History of hiatal hernia     HYPERLIPIDEMIA 08/25/2008   Qualifier: Diagnosis of  By: Beverely Low MD, Natalia Leatherwood     IBS (irritable bowel syndrome)    Left hip pain 07/30/2013   Left wrist pain 07/30/2013   Lumbar strain, initial encounter 07/28/2019   LUNG NODULE 11/19/2009   Neg CT     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 Please review for accuracy.   Otalgia, unspecified ear    Pain in throat    Palpitations    Paroxysmal atrial fibrillation    Peroneal tendinitis of right lower extremity 03/12/2014   Plantar wart 04/02/2014   Pneumonia    RHINITIS 06/21/2009   Qualifier: Diagnosis of  By: Beverely Low MD, Natalia Leatherwood     Right  ankle injury, subsequent encounter 01/11/2018   Right foot pain 08/09/2011   Sinusitis, chronic    Skene's gland abscess 2009   patient unaware   Sprain of unspecified ligament of right ankle, subsequent encounter    TRANSAMINASES, SERUM, ELEVATED 12/07/2009   Qualifier: Diagnosis of  By: Peggyann Juba FNP, Melissa S    Unspecified cataract    Urinary tract infection, site not specified    Vitamin D deficiency    Past Surgical History:  Procedure Laterality Date   ATRIAL FIBRILLATION ABLATION N/A 08/01/2022   Procedure: ATRIAL FIBRILLATION ABLATION;  Surgeon: Regan Lemming, MD;  Location: [facility];  Service: Cardiovascular;  Laterality: N/A;   BROW LIFT Bilateral 12/29/2021   Procedure: BLEPHAROPLASTY;  Surgeon: Janne Napoleon, MD;  Location: [facility];  Service: Plastics;  Laterality: Bilateral;    COLONOSCOPY     x several   DILATION AND CURETTAGE OF UTERUS  05/2007   GYNECOLOGIC CRYOSURGERY  1980   HYSTEROSCOPY WITH D & C N/A 03/30/2017   Procedure: DILATATION & CURETTAGE/HYSTEROSCOPY WITH ULTRASOUND GUIDANCE;  Surgeon: Jerene Bears, MD;  Location: [facility];  Service: Gynecology;  Laterality: N/A;   UPPER GI ENDOSCOPY     uterine ablation  2007   Patient Active Problem List   Diagnosis Date Noted   Acquired thrombophilia 09/26/2021   Colonoscopy causing post-procedural bleeding 09/20/2020   Essential hypertension 09/20/2020   PAF (paroxysmal atrial fibrillation) 09/20/2020   Plantar wart 04/02/2014   Peroneal tendinitis of right lower extremity 03/12/2014   Diverticulosis 07/27/2012   Sinusitis, chronic    Colon polyp    Menopause    Migraine    NONSPCIFC ABN FINDING RAD & OTH EXAM LUNG FIELD 11/19/2009   HIP PAIN, RIGHT 04/12/2009   Hyperlipidemia 08/25/2008   Depression 08/25/2008    REFERRING DIAG: M54.16 (ICD-10-CM) - Radiculopathy of lumbar region M54.2 (ICD-10-CM) - Neck pain M54.12 (ICD-10-CM) - Cervical radiculopathy  THERAPY DIAG:  Other low back pain  Cervicalgia  Muscle weakness (generalized)  Rationale for Evaluation and Treatment Rehabilitation  PERTINENT HISTORY: anxiety/depression, HTN, GERD, multiple MSK issues, Afib s/p ablation (all of above well controlled per discussion w/ pt)   PRECAUTIONS: none   PATIENT GOALS: would like to get back to more activities (golf, pickleball), remain active with less pain                                                                                                                                                                                SUBJECTIVE STATEMENT:   Pt arrives without pain, doing well overall. States she is getting a low back injection next week   PAIN:  Are you having  pain: none at present Location/description: L sided low back occasionally to BIL belt line, neck L side and  BIL Best-worst over past week: 0-3/10  (0-8/10 on eval)  Per eval -  - aggravating factors: prolonged positioning, sitting without support, more strenuous activities - Easing factors: rest, heat, epsom salt baths, lidocaine patches, medication   OBJECTIVE: (objective measures completed at initial evaluation unless otherwise dated)   DIAGNOSTIC FINDINGS:  Lumbar MRI 04/2022 IMPRESSION: 1. At L4-5 there is a mild broad-based disc bulge with a left paracentral disc protrusion resulting in mass effect on the left intraspinal L5 nerve root. Mild spinal stenosis. 2.  No acute osseous injury of the lumbar spine.  Cervical MRI 01/2022 IMPRESSION: 1. Unchanged severe right C5-6 neural foraminal stenosis secondary to disc osteophyte complex greatest in the right subarticular zone. 2. Unchanged small right foraminal protrusion at C3-4 with mild right neural foraminal stenosis. 3. No spinal canal stenosis. 4. Mild right C7 facet edema.   PATIENT SURVEYS:  FOTO 63 current, 67 predicted 12/27/22: FOTO neck 76, FOTO lumbar 70   COGNITION: Overall cognitive status: Within functional limits for tasks assessed   SENSATION/NEURO: Light touch intact all extremities   Finger<>nose testing unremarkable No clonus BIL  Negative hoffmann and tromner sign  No ataxia with gait     POSTURE: No Significant postural limitations   PALPATION: Concordant tenderness L suboccipitals, levator scap with trigger points throughout       CERVICAL ROM:    Active ROM A/PROM (deg) eval AROM 12/13/22  Flexion 100%   Extension 100%   Right lateral flexion 50% s   Left lateral flexion 50% s   Right rotation 44 deg s 51 deg  Left rotation 52 deg s 60 deg   (Blank rows = not tested) (Key: WFL = within functional limits not formally assessed, * = concordant pain, s = stiffness/stretching sensation, NT = not tested)  Comments: cervical mobility painless but does have L sided stiffness w/ B rotation     LUMBAR  ROM:    Active  A/PROM  eval  Flexion 100% (touches floor)  Extension 75% *   Right lateral flexion 100% (below knee)  Left lateral flexion 75% just above knee)  Right rotation 100%  Left rotation 75%   (Blank rows = not tested) (Key: WFL = within functional limits not formally assessed, * = concordant pain, s = stiffness/stretching sensation, NT = not tested)  Comments:    UPPER EXTREMITY ROM:   A/PROM Right eval Left eval  Shoulder flexion    Shoulder abduction    Shoulder internal rotation    Shoulder external rotation    Elbow flexion      Elbow extension      Wrist flexion      Wrist extension       (Blank rows = not tested) (Key: WFL = within functional limits not formally assessed, * = concordant pain, s = stiffness/stretching sensation, NT = not tested)  Comments: flexion/abduction B UE WFL and symmetrical    UPPER EXTREMITY MMT:   MMT Right eval Left eval  Shoulder flexion 5 5  Shoulder extension      Shoulder abduction 5 5  Shoulder extension      Shoulder internal rotation 5 4+  Shoulder external rotation 5 4+  Elbow flexion      Elbow extension      Grip strength      (Blank rows = not tested)  (Key: WFL = within  functional limits not formally assessed, * = concordant pain, s = stiffness/stretching sensation, NT = not tested)  Comments:        TODAY'S TREATMENT:                                                                                               [facility] Adult PT Treatment:                                                DATE: 01/02/23 Therapeutic Exercise: Suitcase carry 10# 3 laps Waiter's carry 6# 2 laps  Fwd/retro shuffles (pickleball simulation) 2x5 laps along // bars cues for soft impact and change of pace  Lateral shuffling (pickleball simulation) 2x5 laps along // bars [employer] 2x5 laps along // bars cues for sequencing  GTB around wrist BIL scaption 2x12 cues for posture and pacing  Post delt fly hold, unilat RTB with manual  perturbations 2x30sec   [facility] Adult PT Treatment:                                                DATE: 12/27/22 Therapeutic Exercise: 5# DB waiter carry BIL 2x112ft 10# suitcase carry BIL 2x124ft  3# staggered stance low>high press unilat performed BIL, 2x12 cues for form, concentric velocity and eccentric control  Post delt fly at cable column, 2x12 3# BIL cues for posture and form  Manual Therapy: Seated: STM and trigger point release BIL rhomboids, UT, thoracic paraspinals, deltoids. Emphasis R>L    [facility] Adult PT Treatment:                                                DATE: 12/22/22 Therapeutic Exercise: 6# farmer's carry 365ft  Seated machine row 20# 3x10 cues for posture CC paloff press 10# 2x8 BIL Bent row 10# 2x12 BIL cues for form and neutral trunk posture throughout Hip ext + diagonal GTB x10 BIL LE cues for setup, UE support at trunk GTB BIL scaption 2x10 cues for pacing and appropriate ROM Manual Therapy: Seated; STM BIL LS, UT, rhomboids, thoracic paraspinals. Trigger point release throughout, active pin and stretch BIL rhomboids and thoracic paraspinals w/ cervical flexion 2x5     PATIENT EDUCATION:  Education details:rationale for interventions Person educated: Patient Education method: Explanation, Demonstration, Tactile cues, Verbal cues, handout Education comprehension: verbalized understanding/agreement   HOME EXERCISE PROGRAM: Access Code: [email or web address] URL: [email or web address] Date: 12/11/2022 (provided with blue band on 12/27/22 to progress) Prepared by: Lulu Riding  Exercises - Seated Cervical Retraction  - 1 x daily - 7 x weekly - 2 sets - 12 reps - Standing High Row with Resistance  - 1 x daily - 7 x weekly - 2 sets - 10  reps - Seated Hamstring Stretch  - 1-2 x daily - 7 x weekly - 2 sets - 2 reps - 30 hold - Standing Hamstring Stretch with Step  - 1-2 x daily - 7 x weekly - 2 sets - 2 reps - 30 hold - Shoulder PNF D2 with Resistance   - 1 x daily - 7 x weekly - 2 sets - 15 reps - Standing Single Arm Shoulder PNF D1 Extension with Anchored Resistance  - 1 x daily - 7 x weekly - 2 sets - 15 reps - Scapular retraction with ER (MONEY)  - 1 x daily - 7 x weekly - 2 sets - 15 reps - Side Stepping with Resistance at Feet  - 1 x daily - 7 x weekly - 2 sets - 10 reps   ASSESSMENT:   CLINICAL IMPRESSION: 01/02/2023 Pt arrives w/o pain, states she continues to do well overall. Today focusing on progression to more sport specific activity and dynamic postural stability as above, pt tolerates quite well. Also gave education on gradual progression of sport specific activity, appropriate warmup/cooldown, etc. Use of drills in session to simulate sport exercise. No adverse events, pt tolerates well without issue, primary report of muscular fatigue. Discussed with pt, may consider discharge in next couple of sessions pending trajectory with progression back to higher level sport tasks. Pt departs today's session in no acute distress, all voiced questions/concerns addressed appropriately from PT perspective.     Eval - Patient is a very pleasant 67 y.o. woman who was seen today for physical therapy evaluation and treatment for chronic neck/back pain. Pain has been ongoing for several years with fluctuations, pt remains fairly active although she notes she has modified/limited activities to avoid exacerbations. Imaging as above, red flag screening reassuring. On exam pt demos concordant tenderness of L suboccipitals/LS with corresponding limitations in cervical mobility, lumbar mobility limitations in L sidebending and extension with reproduction of concordant low back pain. Pt tolerates exam well and demos good GH strength although L RC musculature appears slightly weaker than R. Significant time spent w/ pt discussion/education re: activity participation, PT impairments/prognosis/POC. Recommend skilled PT to address aforementioned deficits with aim of  maximizing participation in recreational exercise for improved overall health and improve tolerance to daily activities. No adverse events, pt departs today's session in no acute distress, all voiced questions/concerns addressed appropriately from PT perspective.      OBJECTIVE IMPAIRMENTS: decreased activity tolerance, decreased endurance, decreased ROM, decreased strength, hypomobility, increased muscle spasms, and pain.    ACTIVITY LIMITATIONS: lifting, bending, sitting, standing, stairs, and locomotion level   PARTICIPATION LIMITATIONS: community activity   PERSONAL FACTORS: Age, Time since onset of injury/illness/exacerbation, and 1-2 comorbidities: anxiety/depression, Afib  are also affecting patient's functional outcome.    REHAB POTENTIAL: Good   CLINICAL DECISION MAKING: Stable/uncomplicated   EVALUATION COMPLEXITY: Low     GOALS: Goals reviewed with patient? No   SHORT TERM GOALS: Target date: 12/21/2022   Pt will demonstrate appropriate understanding and performance of initially prescribed HEP in order to facilitate improved independence with management of symptoms.  Baseline: HEP to be established 12/22/22: Pt endorses good adherence to home exercises Goal status: MET    LONG TERM GOALS: Target date: 01/18/2023   Pt will score 67 on FOTO in order to demonstrate improved perception of function due to symptoms. Baseline: 63 Goal status: INITIAL   2. Pt will demonstrate at least 60 degrees of active cervical rotation ROM in order to demonstrate  improved environmental awareness and safety with driving.  Baseline: see ROM chart above Goal status: INITIAL   3. Pt will demonstrate grossly symmetrical GH ER/IR MMT for improved symmetry of UE strength and improved tolerance to functional movements.  Baseline: see MMT chart above Goal status: INITIAL    4. Pt will demonstrate improved symmetry of lumbar ROM in order to promote improved tolerance to multiplanar movements for  participation in recreational activities. Baseline: see ROM chart above Goal status: INITIAL    5. Pt will demonstrate independence w/ final prescribed HEP in order to facilitate improved self efficacy with management of symptoms.             Baseline: HEP TBD            Goal status: INITIAL   6. Pt will endorse greater than or equal to 50% reduction in pain severity over preceding week in order to facilitate improved tolerance to daily and recreational activities.             Baseline: 0-8/10 over past week            Goal status: INITIAL     PLAN:   PT FREQUENCY: 2x/week   PT DURATION: 8 weeks   PLANNED INTERVENTIONS: Therapeutic exercises, Therapeutic activity, Neuromuscular re-education, Balance training, Gait training, Patient/Family education, Self Care, Joint mobilization, Stair training, DME instructions, Aquatic Therapy, Dry Needling, Electrical stimulation, Spinal mobilization, Cryotherapy, Moist heat, Taping, Manual therapy, and Re-evaluation   PLAN FOR NEXT SESSION: look at multiplanar movements with emphasis on core rotational stability given pt report of wanting to get back to golfing, pickleball. Manual PRN as indicated        Ashley Murrain PT, DPT 01/02/2023 4:31 PM    Addendum for discharge 03/02/23:   PHYSICAL THERAPY DISCHARGE SUMMARY  Visits from Start of Care: 7  Current functional level related to goals / functional outcomes: Unable to assess   Remaining deficits: Unable to assess   Education / Equipment: Unable to assess   Patient unable to agree to discharge due to lack of follow up.  Patient goals were  unable to be assessed . Patient is being discharged due to not returning since the last visit.    Ashley Murrain PT, DPT 03/02/2023 11:19 AM

## 2023-01-02 ENCOUNTER — Encounter: Payer: Self-pay | Admitting: Physical Therapy

## 2023-01-02 ENCOUNTER — Ambulatory Visit: Payer: Medicare HMO | Attending: Internal Medicine | Admitting: Physical Therapy

## 2023-01-02 ENCOUNTER — Ambulatory Visit (HOSPITAL_COMMUNITY): Payer: Medicare HMO | Admitting: Licensed Clinical Social Worker

## 2023-01-02 DIAGNOSIS — M6281 Muscle weakness (generalized): Secondary | ICD-10-CM | POA: Diagnosis not present

## 2023-01-02 DIAGNOSIS — M542 Cervicalgia: Secondary | ICD-10-CM | POA: Diagnosis not present

## 2023-01-02 DIAGNOSIS — M5459 Other low back pain: Secondary | ICD-10-CM

## 2023-01-03 ENCOUNTER — Other Ambulatory Visit: Payer: Medicare HMO

## 2023-01-03 NOTE — Progress Notes (Incomplete)
   THERAPIST PROGRESS NOTE  Session Time:   Participation Level: {BHH PARTICIPATION LEVEL:22264}  Behavioral Response: {Appearance:22683}{BHH LEVEL OF CONSCIOUSNESS:22305}{BHH MOOD:22306}  Type of Therapy: {CHL AMB BH Type of Therapy:21022741}  Treatment Goals addressed: ***  ProgressTowards Goals: {Progress Towards Goals:21014066}  Interventions: {CHL AMB BH Type of Intervention:21022753}  Summary: Kara Warren is a 67 y.o. female who presents with ***.   Suicidal/Homicidal: {BHH YES OR NO:22294}{yes/no/with/without intent/plan:22693}  Therapist Response: ***  Plan: Return again in *** weeks.  Diagnosis: No diagnosis found.  Collaboration of Care: {BH OP Collaboration of Care:21014065}  Patient/Guardian was advised Release of Information must be obtained prior to any record release in order to collaborate their care with an outside provider. Patient/Guardian was advised if they have not already done so to contact the registration department to sign all necessary forms in order for [place] to release information regarding their care.   Consent: Patient/Guardian gives verbal consent for treatment and assignment of benefits for services provided during this visit. Patient/Guardian expressed understanding and agreed to proceed.   Jobe Marker Chapman, LCSW 01/03/2023

## 2023-01-04 DIAGNOSIS — N3001 Acute cystitis with hematuria: Secondary | ICD-10-CM | POA: Diagnosis not present

## 2023-01-09 ENCOUNTER — Ambulatory Visit: Payer: Medicare HMO | Admitting: Physical Therapy

## 2023-01-10 ENCOUNTER — Encounter: Payer: Self-pay | Admitting: Cardiology

## 2023-01-10 ENCOUNTER — Other Ambulatory Visit (HOSPITAL_COMMUNITY): Payer: Self-pay

## 2023-01-10 ENCOUNTER — Other Ambulatory Visit: Payer: Medicare HMO

## 2023-01-10 DIAGNOSIS — I480 Paroxysmal atrial fibrillation: Secondary | ICD-10-CM

## 2023-01-10 MED ORDER — APIXABAN 5 MG PO TABS
5.0000 mg | ORAL_TABLET | Freq: Two times a day (BID) | ORAL | 2 refills | Status: DC
Start: 2023-01-10 — End: 2023-04-15
  Filled 2023-01-10: qty 60, 30d supply, fill #0
  Filled 2023-02-19: qty 60, 30d supply, fill #1
  Filled 2023-03-19: qty 60, 30d supply, fill #2

## 2023-01-10 NOTE — Telephone Encounter (Signed)
Prescription refill request for Eliquis received. Indication: Afib  Last office visit: 10/30/22 ([place])  Scr: 0.77 (07/18/22)  Age: 67 Weight: 64.4kg  Appropriate dose. Refill sent.

## 2023-01-11 ENCOUNTER — Other Ambulatory Visit (HOSPITAL_COMMUNITY): Payer: Self-pay

## 2023-01-11 MED ORDER — XIFAXAN 550 MG PO TABS
550.0000 mg | ORAL_TABLET | Freq: Three times a day (TID) | ORAL | 2 refills | Status: DC
  Filled 2023-01-11: qty 42, 14d supply, fill #0

## 2023-01-12 ENCOUNTER — Ambulatory Visit: Payer: Medicare HMO | Admitting: Physical Therapy

## 2023-01-20 ENCOUNTER — Other Ambulatory Visit: Payer: Self-pay

## 2023-01-22 ENCOUNTER — Other Ambulatory Visit (HOSPITAL_COMMUNITY): Payer: Self-pay

## 2023-01-22 ENCOUNTER — Ambulatory Visit: Payer: Medicare HMO | Admitting: Physical Therapy

## 2023-01-23 ENCOUNTER — Encounter (HOSPITAL_COMMUNITY): Payer: Self-pay | Admitting: Licensed Clinical Social Worker

## 2023-01-23 ENCOUNTER — Ambulatory Visit (INDEPENDENT_AMBULATORY_CARE_PROVIDER_SITE_OTHER): Payer: Medicare HMO | Admitting: Licensed Clinical Social Worker

## 2023-01-23 ENCOUNTER — Telehealth: Payer: Self-pay

## 2023-01-23 ENCOUNTER — Other Ambulatory Visit (HOSPITAL_COMMUNITY): Payer: Self-pay

## 2023-01-23 DIAGNOSIS — F4321 Adjustment disorder with depressed mood: Secondary | ICD-10-CM

## 2023-01-23 NOTE — Progress Notes (Signed)
   THERAPIST PROGRESS NOTE  Session Time: 12:30pm-1:30pm  Participation Level: Active  Behavioral Response: Well GroomedAlertEuthymic  Type of Therapy: Individual Therapy  Treatment Goals addressed: Sameena will identify patterns in relationships that are related to her level of self esteem and make choices more in line with higher levels of self esteem and self-compassion    ProgressTowards Goals: Progressing  Interventions: CBT  Summary: MARYON KEMNITZ is a 67 y.o. female who presents with Adjustment Disorder with depressed mood.   Suicidal/Homicidal: Nowithout intent/plan  Therapist Response: Abena engaged well in session with clinician. Clinician utilized CBT to reflect and summarize thoughts, feelings, and interactions. Clinician explored updates from last session, regarding visit with partner's daughter and granddaughter. Clinician noted improvement in communication and team work between Kenton and her partner. Clinician also noted increased confidence and happiness in recounting their time together. Jamariyah shared that conversation has begun again about living together, but this time it is led by her partner. Clinician explored thoughts and feelings about this. Clinician also reflected differences in this time vs last time they discussed this. Clinician utilized Pros and cons, as well as reality testing about the options and speed of movement.   Plan: Return again in 2 weeks.  Diagnosis: Adjustment disorder with depressed mood  Collaboration of Care: Patient refused AEB none required  Patient/Guardian was advised Release of Information must be obtained prior to any record release in order to collaborate their care with an outside provider. Patient/Guardian was advised if they have not already done so to contact the registration department to sign all necessary forms in order for [place] to release information regarding their care.   Consent: Patient/Guardian gives verbal consent for  treatment and assignment of benefits for services provided during this visit. Patient/Guardian expressed understanding and agreed to proceed.   Chryl Heck [place], LCSW 01/23/2023

## 2023-01-24 ENCOUNTER — Ambulatory Visit (INDEPENDENT_AMBULATORY_CARE_PROVIDER_SITE_OTHER): Payer: Medicare HMO | Admitting: Internal Medicine

## 2023-01-24 ENCOUNTER — Encounter: Payer: Self-pay | Admitting: Internal Medicine

## 2023-01-24 ENCOUNTER — Other Ambulatory Visit (HOSPITAL_COMMUNITY): Payer: Self-pay

## 2023-01-24 ENCOUNTER — Other Ambulatory Visit (HOSPITAL_COMMUNITY)
Admission: RE | Admit: 2023-01-24 | Discharge: 2023-01-24 | Disposition: A | Discharge status: Home / Self Care | Payer: Medicare HMO | Source: Ambulatory Visit | Attending: Internal Medicine | Admitting: Internal Medicine

## 2023-01-24 VITALS — BP 124/84 | HR 53 | Temp 97.7°F | Ht 65.5 in | Wt 140.8 lb

## 2023-01-24 DIAGNOSIS — F330 Major depressive disorder, recurrent, mild: Secondary | ICD-10-CM

## 2023-01-24 DIAGNOSIS — Z124 Encounter for screening for malignant neoplasm of cervix: Secondary | ICD-10-CM | POA: Diagnosis not present

## 2023-01-24 DIAGNOSIS — E782 Mixed hyperlipidemia: Secondary | ICD-10-CM

## 2023-01-24 DIAGNOSIS — Z1151 Encounter for screening for human papillomavirus (HPV): Secondary | ICD-10-CM | POA: Diagnosis not present

## 2023-01-24 DIAGNOSIS — I10 Essential (primary) hypertension: Secondary | ICD-10-CM

## 2023-01-24 DIAGNOSIS — Z0000 Encounter for general adult medical examination without abnormal findings: Secondary | ICD-10-CM

## 2023-01-24 DIAGNOSIS — Z01419 Encounter for gynecological examination (general) (routine) without abnormal findings: Secondary | ICD-10-CM | POA: Insufficient documentation

## 2023-01-24 DIAGNOSIS — I480 Paroxysmal atrial fibrillation: Secondary | ICD-10-CM

## 2023-01-24 DIAGNOSIS — J302 Other seasonal allergic rhinitis: Secondary | ICD-10-CM | POA: Diagnosis not present

## 2023-01-24 LAB — COMPREHENSIVE METABOLIC PANEL
ALT: 23 U/L (ref 0–35)
AST: 25 U/L (ref 0–37)
Albumin: 4.5 g/dL (ref 3.5–5.2)
Alkaline Phosphatase: 52 U/L (ref 39–117)
BUN: 14 mg/dL (ref 6–23)
CO2: 30 mEq/L (ref 19–32)
Calcium: 9.6 mg/dL (ref 8.4–10.5)
Chloride: 99 mEq/L (ref 96–112)
Creatinine, Ser: 0.81 mg/dL (ref 0.40–1.20)
GFR: 74.94 mL/min (ref >60.00)
Glucose, Bld: 92 mg/dL (ref 70–99)
Potassium: 4.3 mEq/L (ref 3.5–5.1)
Sodium: 137 mEq/L (ref 135–145)
Total Bilirubin: 0.6 mg/dL (ref 0.2–1.2)
Total Protein: 7 g/dL (ref 6.0–8.3)

## 2023-01-24 LAB — LIPID PANEL
Cholesterol: 197 mg/dL (ref 0–200)
HDL: 83.9 mg/dL (ref >39.00)
LDL Cholesterol: 100 mg/dL — ABNORMAL HIGH (ref 0–99)
NonHDL: 113.16
Total CHOL/HDL Ratio: 2
Triglycerides: 64 mg/dL (ref 0.0–149.0)
VLDL: 12.8 mg/dL (ref 0.0–40.0)

## 2023-01-24 LAB — VITAMIN B12: Vitamin B-12: 747 pg/mL (ref 211–911)

## 2023-01-24 LAB — CBC WITH DIFFERENTIAL/PLATELET
Basophils Absolute: 0 10*3/uL (ref 0.0–0.1)
Basophils Relative: 1.1 % (ref 0.0–3.0)
Eosinophils Absolute: 0.1 10*3/uL (ref 0.0–0.7)
Eosinophils Relative: 1.4 % (ref 0.0–5.0)
HCT: 39.5 % (ref 36.0–46.0)
Hemoglobin: 13.3 g/dL (ref 12.0–15.0)
Lymphocytes Relative: 40.8 % (ref 12.0–46.0)
Lymphs Abs: 1.4 10*3/uL (ref 0.7–4.0)
MCHC: 33.7 g/dL (ref 30.0–36.0)
MCV: 99.9 fl (ref 78.0–100.0)
Monocytes Absolute: 0.3 10*3/uL (ref 0.1–1.0)
Monocytes Relative: 8.3 % (ref 3.0–12.0)
Neutro Abs: 1.7 10*3/uL (ref 1.4–7.7)
Neutrophils Relative %: 48.4 % (ref 43.0–77.0)
Platelets: 339 10*3/uL (ref 150.0–400.0)
RBC: 3.96 Mil/uL (ref 3.87–5.11)
RDW: 12.9 % (ref 11.5–15.5)
WBC: 3.5 10*3/uL — ABNORMAL LOW (ref 4.0–10.5)

## 2023-01-24 LAB — TSH: TSH: 0.9 u[IU]/mL (ref 0.35–5.50)

## 2023-01-24 LAB — VITAMIN D 25 HYDROXY (VIT D DEFICIENCY, FRACTURES): VITD: 43.05 ng/mL (ref 30.00–100.00)

## 2023-01-24 MED ORDER — CITALOPRAM HYDROBROMIDE 20 MG PO TABS
20.0000 mg | ORAL_TABLET | Freq: Every day | ORAL | 1 refills | Status: DC
Start: 2023-01-24 — End: 2023-07-17
  Filled 2023-01-24: qty 90, 90d supply, fill #0
  Filled 2023-04-25: qty 90, 90d supply, fill #1

## 2023-01-24 MED ORDER — AZELASTINE HCL 0.1 % NA SOLN
2.0000 | Freq: Two times a day (BID) | NASAL | 12 refills | Status: DC
Start: 2023-01-24 — End: 2024-02-26
  Filled 2023-01-24: qty 30, 30d supply, fill #0
  Filled 2023-04-15 – 2023-07-17 (×2): qty 30, 30d supply, fill #1
  Filled 2023-12-02: qty 30, 30d supply, fill #2
  Filled 2023-12-31: qty 30, 30d supply, fill #3

## 2023-01-24 NOTE — Patient Instructions (Signed)
-  Nice seeing you today!!  -Lab work today; will notify you once results are available.  -Consider updating your COVID vaccine.  -Schedule follow up in 1 year or sooner as needed.

## 2023-01-24 NOTE — Progress Notes (Signed)
Established Patient Office Visit     CC/Reason for Visit: Annual preventive exam and subsequent Medicare wellness visit  HPI: Kara Warren is a 67 y.o. female who is coming in today for the above mentioned reasons. Past Medical History is significant for: Depression, hypertension and A-fib.  She also has some bulging lumbar disks has completed PT and is scheduled for an epidural injection.  She is otherwise doing well.  She has routine eye and dental care, no perceived hearing difficulties.  She exercises routinely.  All immunizations are up-to-date with exception of COVID.  She had a mammogram in August 2023, had a colonoscopy with Dr. Loreta Ave this year and was advised to 5-year follow-up.  She is overdue for Pap smear.   Past Medical/Surgical History: Past Medical History:  Diagnosis Date   Abdominal pain, left upper quadrant    Acute bronchospasm    Acute neck pain 07/02/2019   Acute sinusitis, unspecified 05/20/2009   Centricity Description: SINUSITIS - ACUTE-NOS Qualifier: Diagnosis of  By: Beverely Low MD, Natalia Leatherwood   Centricity Description: ACUTE SINUSITIS, UNSPECIFIED Qualifier: Diagnosis of  By: Thurmond Butts MD, Eugene   Centricity Description: SINUSITIS, ACUTE Qualifier: Diagnosis of  By: Orson Aloe MD, Tinnie Gens     Anxiety    Atypical chest pain 03/05/2013   Body mass index (BMI) of 23.0-23.9 in adult    Cervicalgia    Colon polyp    Costochondral junction syndrome    Cough    DEPRESSION 08/25/2008   Qualifier: Diagnosis of  By: Yetta Barre CMA, Chemira     Diarrhea    Diverticulosis 07/27/2012   Dorsalgia    Dysrhythmia    hx a-fib   Elevated blood pressure reading without diagnosis of hypertension    Endometrial polyp 05/2007   benign   Essential hypertension 09/20/2020   Family history of osteoporosis 12/28/2011   Mother pt had normal Dexa 11/2009    Fatigue    GERD (gastroesophageal reflux disease)    HIP PAIN, RIGHT 04/12/2009   Qualifier: Diagnosis of  By: Darrick Penna MD, KARL      History of hiatal hernia    HYPERLIPIDEMIA 08/25/2008   Qualifier: Diagnosis of  By: Beverely Low MD, Natalia Leatherwood     IBS (irritable bowel syndrome)    Left hip pain 07/30/2013   Left wrist pain 07/30/2013   Lumbar strain, initial encounter 07/28/2019   LUNG NODULE 11/19/2009   Neg CT     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 Please review for accuracy.   Otalgia, unspecified ear    Pain in throat    Palpitations    Paroxysmal atrial fibrillation    Peroneal tendinitis of right lower extremity 03/12/2014   Plantar wart 04/02/2014   Pneumonia    RHINITIS 06/21/2009   Qualifier: Diagnosis of  By: Beverely Low MD, Natalia Leatherwood     Right ankle injury, subsequent encounter 01/11/2018   Right foot pain 08/09/2011   Sinusitis, chronic    Skene's gland abscess  2009   patient unaware   Sprain of unspecified ligament of right ankle, subsequent encounter    TRANSAMINASES, SERUM, ELEVATED 12/07/2009   Qualifier: Diagnosis of  By: Peggyann Juba FNP, Melissa S    Unspecified cataract    Urinary tract infection, site not specified    Vitamin D deficiency     Past Surgical History:  Procedure Laterality Date   ATRIAL FIBRILLATION ABLATION N/A 08/01/2022   Procedure: ATRIAL FIBRILLATION ABLATION;  Surgeon: Regan Lemming, MD;  Location: [facility];  Service: Cardiovascular;  Laterality: N/A;   BROW LIFT Bilateral 12/29/2021   Procedure: BLEPHAROPLASTY;  Surgeon: Janne Napoleon, MD;  Location: [facility] OR;  Service:  Plastics;  Laterality: Bilateral;   COLONOSCOPY     x several   DILATION AND CURETTAGE OF UTERUS  05/2007   GYNECOLOGIC CRYOSURGERY  1980   HYSTEROSCOPY WITH D & C N/A 03/30/2017   Procedure: DILATATION & CURETTAGE/HYSTEROSCOPY WITH ULTRASOUND GUIDANCE;  Surgeon: Jerene Bears, MD;  Location: [facility];  Service: Gynecology;  Laterality: N/A;   UPPER GI ENDOSCOPY     uterine ablation  2007    Social History:  reports that she quit smoking about 44 years ago. Her smoking use included cigarettes. She has a 0.30 pack-year smoking history. She has never used smokeless tobacco. She reports current alcohol use of about 7.0 standard drinks of alcohol per week. She reports that she does not use drugs.  Allergies: Allergies  Allergen Reactions   Iohexol     Pt developed itching, redness, and hives after steroid injeciton (included lidocaine, kenalog, and Iohexol). Unsure which one caused the reaction. Pt will need 13 hr prep prior to any future Steroid injections. AV, RN   Lidocaine     Pt developed itching, redness, and hives after steroid injeciton (included lidocaine, kenalog, and Iohexol). Unsure which one caused the reaction. Pt will need 13 hr prep prior to any future Steroid injections. AV, RN   Augmentin [Amoxicillin-Pot Clavulanate] Diarrhea    Family History:  Family History  Problem Relation Age of Onset   Colon polyps Father    Ulcerative colitis Father    CAD Father        Stent placement at age 80   Glaucoma Father    Hypertension Father    Colitis Father    Breast cancer Paternal Grandmother    Colon cancer Maternal Grandfather    Colon polyps Mother    Osteoporosis Mother    Thyroid disease Mother        hypo   Glaucoma Mother    Diverticulosis Mother    Sudden death Neg Hx    Hyperlipidemia Neg Hx    Heart attack Neg Hx    Diabetes Neg Hx      Current Outpatient Medications:    apixaban (ELIQUIS) 5 MG TABS tablet, Take 1 tablet (5 mg total) by mouth 2 (two)  times daily., Disp: 60 tablet, Rfl: 2   azelastine (ASTELIN) 0.1 % nasal spray, Place 2 sprays into both nostrils 2 (two) times daily. Use in each nostril as directed, Disp: 30 mL, Rfl: 12   Calcium Carbonate-Vitamin D (CALTRATE 600+D PO), Take 1 tablet by mouth daily., Disp: , Rfl:    fluticasone (FLONASE) 50 MCG/ACT nasal spray, Place 1-2 sprays into both nostrils daily., Disp: 16 g, Rfl: 3   ipratropium (ATROVENT) 0.03 % nasal spray, Place 2 sprays into both nostrils every 12 (twelve) hours. As needed., Disp: 30 mL, Rfl: 0  levocetirizine (XYZAL) 5 MG tablet, Take 5 mg by mouth every evening., Disp: , Rfl:    Multiple Vitamins-Minerals (MULTI FOR HER 50+) TABS, Take 1 tablet by mouth daily., Disp: , Rfl:    omeprazole (PRILOSEC) 20 MG capsule, Take 1 capsule (20 mg total) by mouth in the morning 30 minutes before morning meal, Disp: 90 capsule, Rfl: 1   Probiotic Product (PROBIOTIC PO), Take 1 capsule by mouth daily. Ultra Flora, Disp: , Rfl:    vitamin C (ASCORBIC ACID) 500 MG tablet, Take 500 mg by mouth daily., Disp: , Rfl:    citalopram (CELEXA) 20 MG tablet, Take 1 tablet (20 mg total) by mouth daily., Disp: 90 tablet, Rfl: 1  Review of Systems:  Negative unless indicated in HPI.   Physical Exam: Vitals:   01/24/23 0749  BP: 124/84  Pulse: (!) 53  Temp: 97.7 F (36.5 C)  TempSrc: Oral  SpO2: 99%  Weight: 140 lb 12.8 oz (63.9 kg)  Height: 5' 5.5" (1.664 m)    Body mass index is 23.07 kg/m.   Physical Exam Vitals reviewed.  Constitutional:      General: She is not in acute distress.    Appearance: Normal appearance. She is not ill-appearing, toxic-appearing or diaphoretic.  HENT:     Head: Normocephalic.     Right Ear: Tympanic membrane, ear canal and external ear normal. There is no impacted cerumen.     Left Ear: Tympanic membrane, ear canal and external ear normal. There is no impacted cerumen.     Nose: Nose normal.     Mouth/Throat:     Mouth: Mucous membranes  are moist.     Pharynx: Oropharynx is clear. No oropharyngeal exudate or posterior oropharyngeal erythema.  Eyes:     General: No scleral icterus.       Right eye: No discharge.        Left eye: No discharge.     Conjunctiva/sclera: Conjunctivae normal.     Pupils: Pupils are equal, round, and reactive to light.  Neck:     Vascular: No carotid bruit.  Cardiovascular:     Rate and Rhythm: Normal rate and regular rhythm.     Pulses: Normal pulses.     Heart sounds: Normal heart sounds.  Pulmonary:     Effort: Pulmonary effort is normal. No respiratory distress.     Breath sounds: Normal breath sounds.  Abdominal:     General: Abdomen is flat. Bowel sounds are normal.     Palpations: Abdomen is soft.  Musculoskeletal:        General: Normal range of motion.     Cervical back: Normal range of motion.  Skin:    General: Skin is warm and dry.  Neurological:     General: No focal deficit present.     Mental Status: She is alert and oriented to person, place, and time. Mental status is at baseline.  Psychiatric:        Mood and Affect: Mood normal.        Behavior: Behavior normal.        Thought Content: Thought content normal.        Judgment: Judgment normal.      Subsequent Medicare wellness visit   1. Risk factors, based on past  M,S,F - Cardiac Risk Factors include: advanced age (>79men, >70 women)   2.  Physical activities: Dietary issues and exercise activities discussed:  Current Exercise Habits: Home exercise routine, Type of exercise: walking, Time (  Minutes): 60, Frequency (Times/Week): 7, Weekly Exercise (Minutes/Week): 420, Intensity: Moderate   3.  Depression/mood:  Flowsheet Row Office Visit from 01/24/2023 in [facility] Total Score 0        4.  ADL's:    01/23/2023    4:37 PM  In your present state of health, do you have any difficulty performing the following activities:  Hearing? 0  Vision? 0  Difficulty  concentrating or making decisions? 0  Walking or climbing stairs? 0  Dressing or bathing? 0  Doing errands, shopping? 0  Preparing Food and eating ? N  Using the Toilet? N  In the past six months, have you accidently leaked urine? N  Do you have problems with loss of bowel control? N  Managing your Medications? N  Managing your Finances? N  Housekeeping or managing your Housekeeping? N     5.  Fall risk:     03/29/2022   10:22 AM 04/19/2022    1:29 PM 06/27/2022    3:10 PM 08/01/2022   11:24 AM 01/23/2023    4:39 PM  Fall Risk  Falls in the past year?  0 0  0  Was there an injury with Fall?  0 0  0  Fall Risk Category Calculator  0 0  0  Fall Risk Category (Retired)  Low Low    (RETIRED) Patient Fall Risk Level Low fall risk Low fall risk  Low fall risk   Patient at Risk for Falls Due to  No Fall Risks No Fall Risks    Fall risk Follow up  Falls evaluation completed Falls evaluation completed  Falls evaluation completed     6.  Home safety: No problems identified   7.  Height weight, and visual acuity: height and weight as above, vision/hearing: Vision Screening   Right eye Left eye Both eyes  Without correction     With correction     8.  Counseling: Counseling given: Not Answered Tobacco comments: Former smoker 08/29/22    9. Lab orders based on risk factors: Laboratory update will be reviewed   10. Cognitive assessment:        01/23/2023    4:40 PM  6CIT Screen  What Year? 0 points  What month? 0 points  What time? 0 points  Count back from 20 0 points  Months in reverse 0 points  Repeat phrase 0 points  Total Score 0 points     11. Screening: Patient provided with a written and personalized 5-10 year screening schedule in the AVS. Health Maintenance  Topic Date Due   Hepatitis C Screening: USPSTF Recommendation to screen - Ages 66-79 yo.  Never done   COVID-19 Vaccine (7 - 2023-24 season) 07/26/2023*   Flu Shot  05/03/2023    Mammogram  05/27/2023   Medicare Annual Wellness Visit  01/24/2024   DTaP/Tdap/Td vaccine (4 - Td or Tdap) 10/07/2028   Colon Cancer Screening  01/22/2033   Pneumonia Vaccine  Completed   DEXA scan (bone density measurement)  Completed   Zoster (Shingles) Vaccine  Completed   HPV Vaccine  Aged Out  *Topic was postponed. The date shown is not the original due date.    12. Provider List Update: Patient Care Team    Relationship Specialty Notifications Start End  Philip Aspen, Limmie Patricia, MD PCP - General Internal Medicine  06/30/21   Kathleene Hazel, MD PCP - Cardiology Cardiology Admissions 08/20/19  Regan Lemming, MD PCP - Electrophysiology Cardiology  04/12/22      13. Advance Directives: Does Patient Have a Medical Advance Directive?: Yes Type of Advance Directive: Healthcare Power of Attorney, Living will, Out of facility DNR (pink MOST or yellow form) Does patient want to make changes to medical advance directive?: No - Patient declined Copy of Healthcare Power of Attorney in Chart?: No - copy requested  14. Opioids: Patient is not on any opioid prescriptions and has no risk factors for a substance use disorder.   15.   Goals      Oral Health Maintained     Evidence-based guidance:  Assess and address barriers to oral health or dental care; consider attitude, knowledge, child's age, development, available support or role-modeling.  Identify oral health needs by review of risk screen.  Encourage meticulous routine oral care that includes wiping, brushing (with an electric toothbrush if available), flossing and preventive care.  Encourage appropriate amount of fluoridated toothpaste (?osmear? until age 56, pea-sized amount ages 51 and older).  Promote age-appropriate use of xylitol-containing products throughout the day such as toothpaste, mouthwash, candies, mints or chewing gum.  Anticipate fluoride supplementation either parent administered and/or  professionally applied.  Encourage breastfeeding mothers to continue up to age 70 months.  Provide anticipatory guidance regarding interplay between oral health and disease.  Encourage use of dental mouthguards when participating in contact sports.  Assist patient or parent to obtain dental care.   Notes:          I have personally reviewed and noted the following in the patient's chart:   Medical and social history Use of alcohol, tobacco or illicit drugs  Current medications and supplements Functional ability and status Nutritional status Physical activity Advanced directives List of other physicians Hospitalizations, surgeries, and ER visits in previous 12 months Vitals Screenings to include cognitive, depression, and falls Referrals and appointments  In addition, I have reviewed and discussed with patient certain preventive protocols, quality metrics, and best practice recommendations. A written personalized care plan for preventive services as well as general preventive health recommendations were provided to patient.  Impression and Plan:  Medicare annual wellness visit, subsequent  AF (paroxysmal atrial fibrillation) - Plan: TSH, Vitamin B12, Vitamin D, 25-hydroxy  Essential hypertension - Plan: CBC with Differential/Platelet, Comprehensive metabolic panel  Mixed hyperlipidemia - Plan: Lipid panel  Seasonal allergies - Plan: azelastine (ASTELIN) 0.1 % nasal spray  Mild episode of recurrent major depressive disorder - Plan: citalopram (CELEXA) 20 MG tablet  -Recommend routine eye and dental care. -Healthy lifestyle discussed in detail. -Labs to be updated today. -Prostate cancer screening: N/A Health Maintenance  Topic Date Due   Hepatitis C Screening: USPSTF Recommendation to screen - Ages 4-79 yo.  Never done   COVID-19 Vaccine (7 - 2023-24 season) 07/26/2023*   Flu Shot  05/03/2023   Mammogram  05/27/2023   Medicare Annual Wellness Visit  01/24/2024    DTaP/Tdap/Td vaccine (4 - Td or Tdap) 10/07/2028   Colon Cancer Screening  01/22/2033   Pneumonia Vaccine  Completed   DEXA scan (bone density measurement)  Completed   Zoster (Shingles) Vaccine  Completed   HPV Vaccine  Aged Out  *Topic was postponed. The date shown is not the original due date.    -Advised to update COVID-vaccine. -Pap smear done in office today.     Chaya Jan, MD [facility]

## 2023-01-25 ENCOUNTER — Encounter: Payer: Self-pay | Admitting: Cardiology

## 2023-01-26 ENCOUNTER — Other Ambulatory Visit: Payer: Self-pay

## 2023-01-26 ENCOUNTER — Other Ambulatory Visit (HOSPITAL_COMMUNITY): Payer: Self-pay

## 2023-01-26 LAB — CYTOLOGY - PAP
Comment: NEGATIVE
Diagnosis: NEGATIVE
High risk HPV: NEGATIVE

## 2023-01-29 ENCOUNTER — Telehealth: Payer: Self-pay

## 2023-01-29 NOTE — Telephone Encounter (Signed)
Called pt to go over her 13 hr prep for upcoming Steroid injection at [facility]. The patient reported she was scared to take 50 mg of prednisone x3 doses due to her history of afib. I relayed this information to Dr. Mosetta Putt and he stated the patient could take 50 mg of benadryl 1 hour prior to her appt time and she did not have to take the full 13 hr prep. Called the patient and left a voicemail explaining this to the patient and left my direct contact information if she has any questions.

## 2023-01-31 NOTE — Discharge Instructions (Addendum)
 Post Procedure Spinal Discharge Instruction Sheet  You may resume a regular diet and any medications that you routinely take (including pain medications) unless otherwise noted by MD.  No driving day of procedure.  Light activity throughout the rest of the day.  Do not do any strenuous work, exercise, bending or lifting.  The day following the procedure, you can resume normal physical activity but you should refrain from exercising or physical therapy for at least three days thereafter.  You may apply ice to the injection site, 20 minutes on, 20 minutes off, as needed. Do not apply ice directly to skin.    Common Side Effects:  Headaches- take your usual medications as directed by your physician.  Increase your fluid intake.  Caffeinated beverages may be helpful.  Lie flat in bed until your headache resolves.  Restlessness or inability to sleep- you may have trouble sleeping for the next few days.  Ask your referring physician if you need any medication for sleep.  Facial flushing or redness- should subside within a few days.  Increased pain- a temporary increase in pain a day or two following your procedure is not unusual.  Take your pain medication as prescribed by your referring physician.  Leg cramps  Please contact our office at [phone] for the following symptoms: Fever greater than 100 degrees. Headaches unresolved with medication after 2-3 days. Increased swelling, pain, or redness at injection site.  You may resume your Eliquis  24 hours after injection.  Thank you for visiting [facility] today.

## 2023-02-01 ENCOUNTER — Ambulatory Visit
Admission: RE | Admit: 2023-02-01 | Discharge: 2023-02-01 | Disposition: A | Discharge status: Home / Self Care | Payer: Medicare HMO | Source: Ambulatory Visit | Attending: Family Medicine | Admitting: Family Medicine

## 2023-02-01 DIAGNOSIS — M47816 Spondylosis without myelopathy or radiculopathy, lumbar region: Secondary | ICD-10-CM | POA: Diagnosis not present

## 2023-02-01 DIAGNOSIS — M5416 Radiculopathy, lumbar region: Secondary | ICD-10-CM

## 2023-02-01 DIAGNOSIS — M545 Low back pain, unspecified: Secondary | ICD-10-CM | POA: Diagnosis not present

## 2023-02-01 DIAGNOSIS — M5126 Other intervertebral disc displacement, lumbar region: Secondary | ICD-10-CM | POA: Diagnosis not present

## 2023-02-01 MED ORDER — METHYLPREDNISOLONE ACETATE 40 MG/ML INJ SUSP (RADIOLOG
80.0000 mg | Freq: Once | INTRAMUSCULAR | Status: AC
  Administered 2023-02-01: 80 mg via EPIDURAL

## 2023-02-01 MED ORDER — IOPAMIDOL (ISOVUE-M 200) INJECTION 41%
1.0000 mL | Freq: Once | INTRAMUSCULAR | Status: AC
  Administered 2023-02-01: 1 mL via EPIDURAL

## 2023-02-07 ENCOUNTER — Other Ambulatory Visit: Payer: Medicare HMO

## 2023-02-13 ENCOUNTER — Ambulatory Visit (INDEPENDENT_AMBULATORY_CARE_PROVIDER_SITE_OTHER): Payer: Medicare HMO | Admitting: Licensed Clinical Social Worker

## 2023-02-13 ENCOUNTER — Encounter (HOSPITAL_COMMUNITY): Payer: Self-pay | Admitting: Licensed Clinical Social Worker

## 2023-02-13 DIAGNOSIS — F4321 Adjustment disorder with depressed mood: Secondary | ICD-10-CM | POA: Diagnosis not present

## 2023-02-13 NOTE — Progress Notes (Signed)
   THERAPIST PROGRESS NOTE  Session Time: 12:30pm-1:25pm  Participation Level: Active  Behavioral Response: Well GroomedAlertEuthymic  Type of Therapy: Individual Therapy  Treatment Goals addressed: [name] will identify patterns in relationships that are related to her level of self esteem and make choices more in line with higher levels of self esteem and self-compassion    ProgressTowards Goals: Progressing  Interventions: CBT  Summary: Kara Warren is a 67 y.o. female who presents with Adjustment Disorder with depressed mood.   Suicidal/Homicidal: Nowithout intent/plan  Therapist Response: Gearlene engaged well in individual session with clinician. Clinician utilized CBT to process thoughts, feelings, and interactions with others since last session. Syvannah provided updates on her relationship and noted plan to start the financial paperwork that will be required before house hunting with partner. Clinician discussed the challenges with adjusting to living with her partner, giving up her home of 17 years, and moving forward with this relationship. Clinician discussed the role of grief in this change, as well as the back and forth nature of the relationship up until recently. Clinician identified the importance of making thoughtful decisions and knowing that she can back out if she is not ready.   Plan: Return again in 2 weeks.  Diagnosis: Adjustment disorder with depressed mood  Collaboration of Care: Patient refused AEB none required  Patient/Guardian was advised Release of Information must be obtained prior to any record release in order to collaborate their care with an outside provider. Patient/Guardian was advised if they have not already done so to contact the registration department to sign all necessary forms in order for [place] to release information regarding their care.   Consent: Patient/Guardian gives verbal consent for treatment and assignment of benefits for services provided  during this visit. Patient/Guardian expressed understanding and agreed to proceed.   Chryl Heck [place], LCSW 02/13/2023

## 2023-03-06 ENCOUNTER — Ambulatory Visit (INDEPENDENT_AMBULATORY_CARE_PROVIDER_SITE_OTHER): Payer: Medicare HMO | Admitting: Licensed Clinical Social Worker

## 2023-03-06 DIAGNOSIS — F4321 Adjustment disorder with depressed mood: Secondary | ICD-10-CM

## 2023-03-11 IMAGING — CT CT RENAL STONE PROTOCOL
2 of 4 series · 16 of 46 positions shown, 18 images · non-contrast
Comparison: May 2020

CLINICAL DATA: Flank pain, kidney stone suspected hematuria and
left flank pain



[Series 2: stone full · axial · 0.62mm/px · z∈[+794,+1199]mm · 13 of 89 slices shown, 15 images]
[im 4/89  soft-tissue]
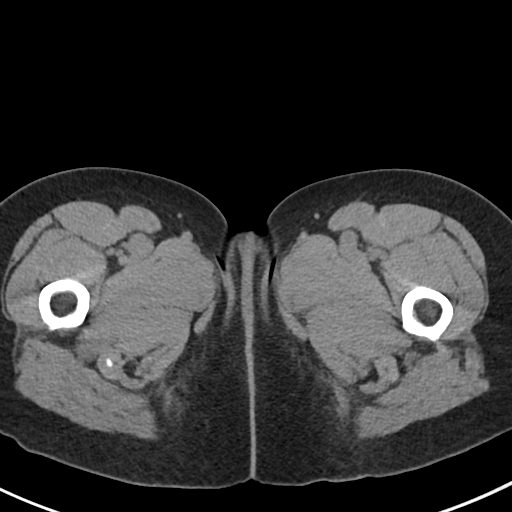
[im 4/89  bone]
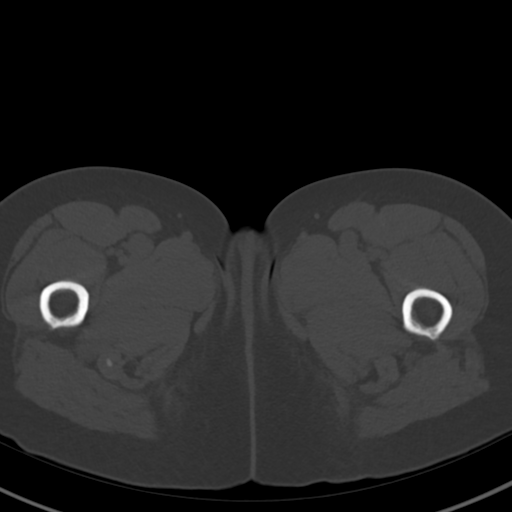
[im 12/89  soft-tissue]
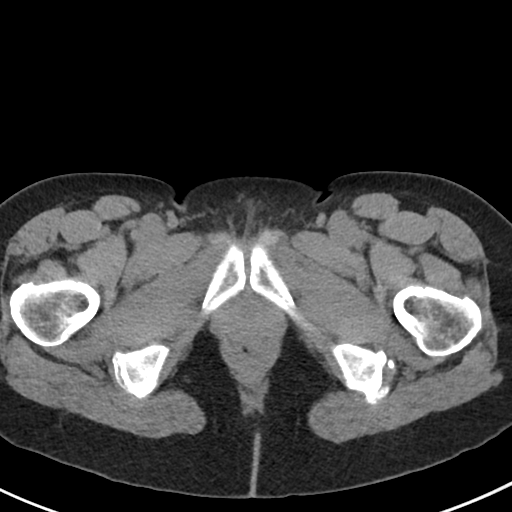
[im 19/89  soft-tissue]
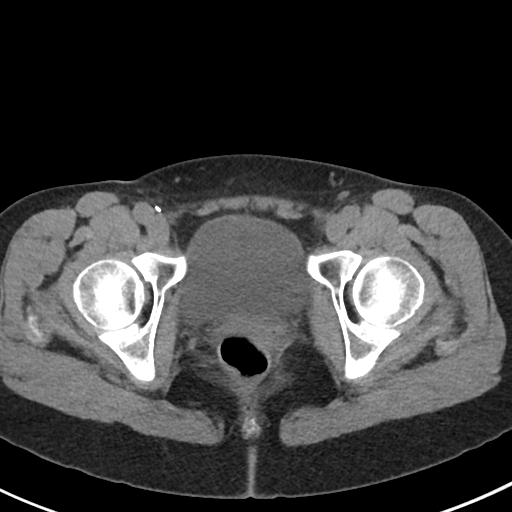
[im 26/89  soft-tissue]
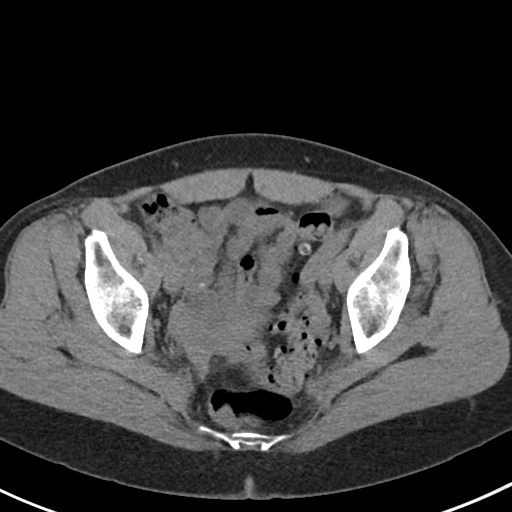
[im 30/89  soft-tissue]
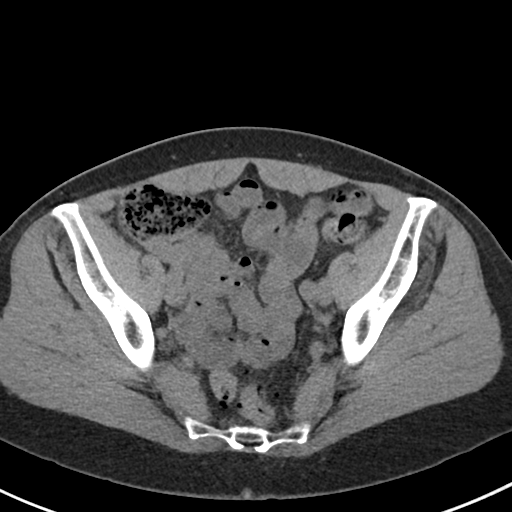
[im 37/89  soft-tissue]
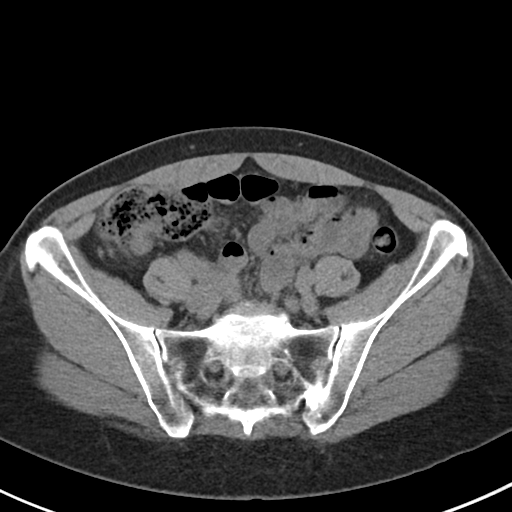
[im 45/89  soft-tissue]
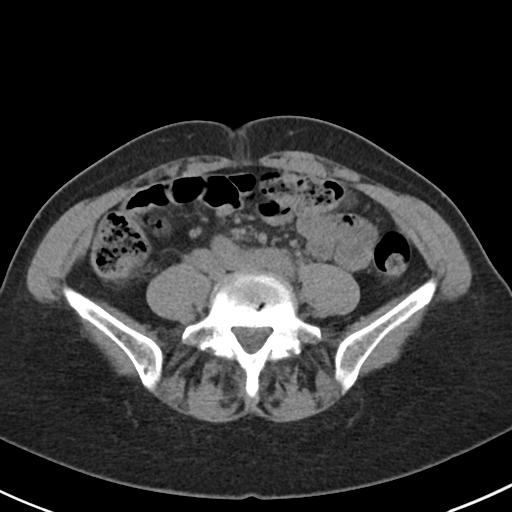
[im 52/89  soft-tissue]
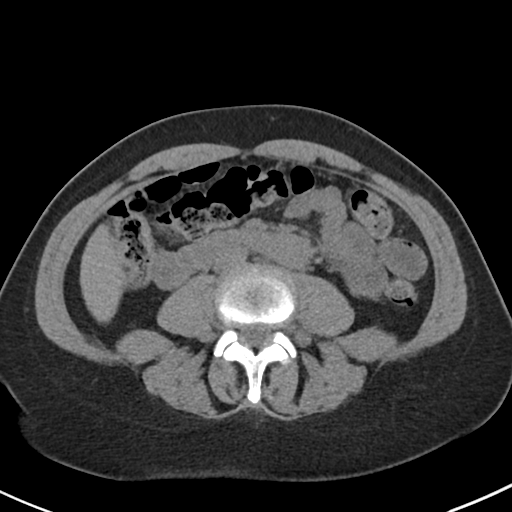
[im 59/89  soft-tissue]
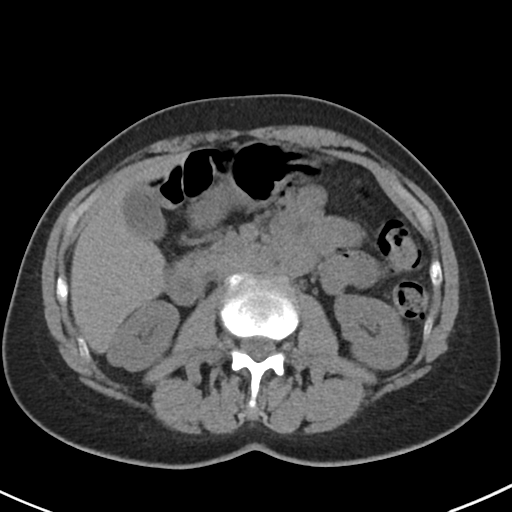
[im 59/89  bone]
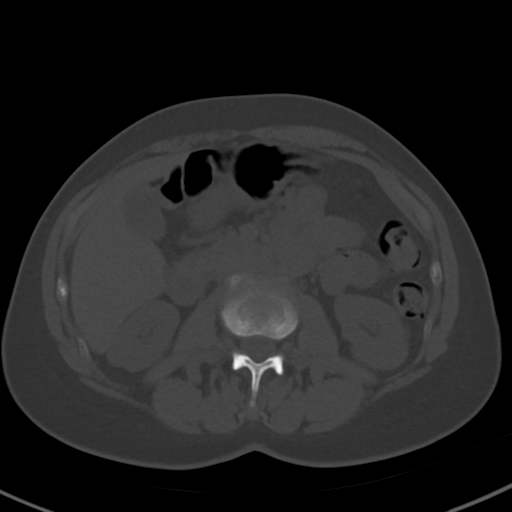
[im 63/89  soft-tissue]
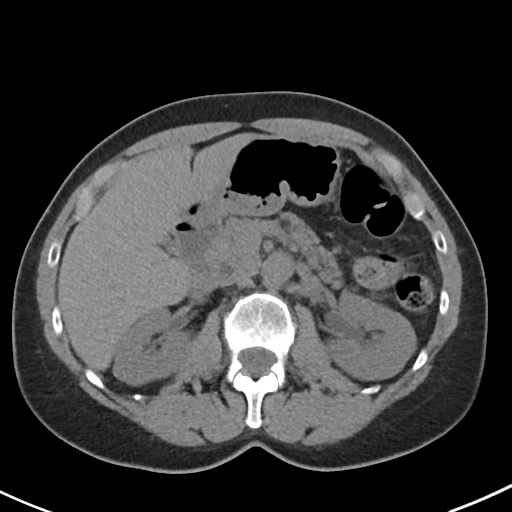
[im 70/89  soft-tissue]
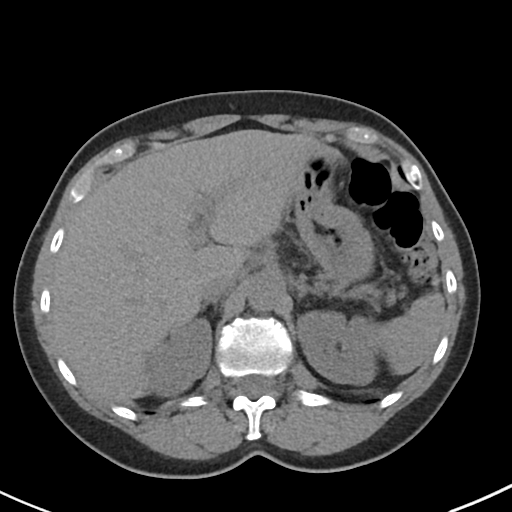
[im 78/89  soft-tissue]
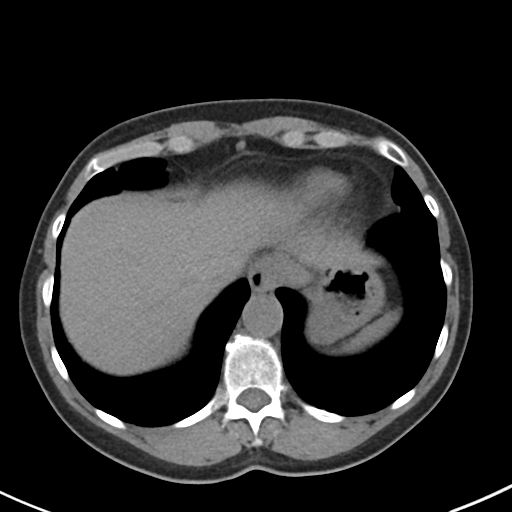
[im 85/89  soft-tissue]
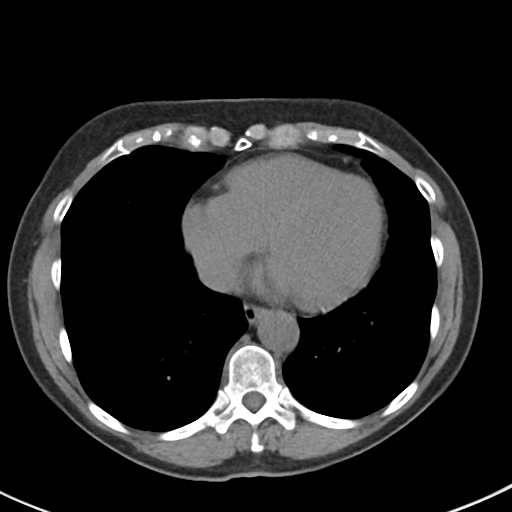

[Series 5: coronal · coronal · 0.68mm/px · 3 of 88 slices shown]
[im 30/88  soft-tissue]
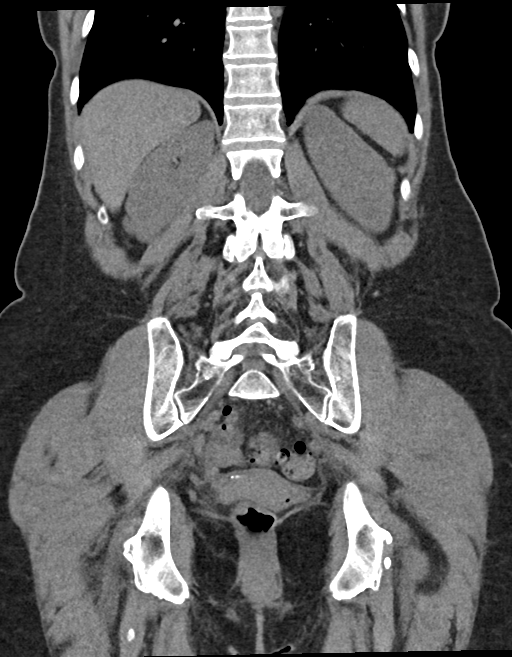
[im 39/88  soft-tissue]
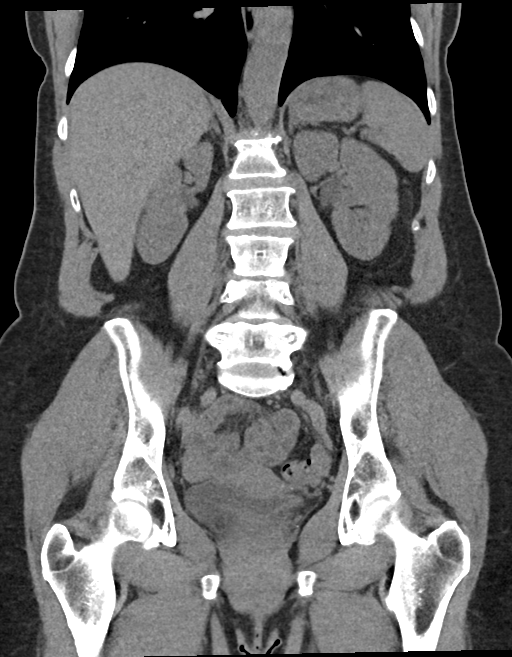
[im 49/88  soft-tissue]
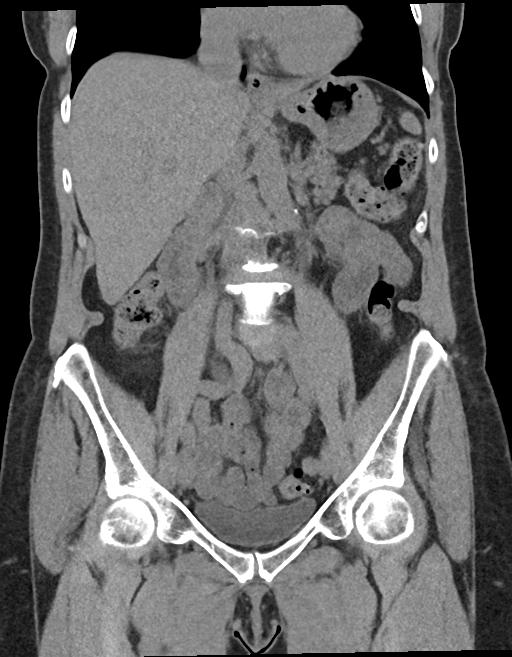

[16 of 46 positions shown; findings below may reference images not displayed]

FINDINGS: Lower chest: No acute abnormality.

Hepatobiliary: No focal liver abnormality is seen. No gallstones,
gallbladder wall thickening, or biliary dilatation.

Pancreas: Unremarkable.

Spleen: Unremarkable.

Adrenals/Urinary Tract: Adrenals are unremarkable. No renal calculi
or hydronephrosis. Bladder is unremarkable.

Stomach/Bowel: Stomach is within normal limits. Bowel is normal in
caliber. Sigmoid diverticulosis. Normal appendix.

Vascular/Lymphatic: Trace calcified plaque along the abdominal
aorta. No enlarged nodes identified.

Reproductive: No pelvic mass.

Other: No free fluid.  Abdominal wall is unremarkable.

Musculoskeletal: No acute osseous abnormality.
IMPRESSION: No acute abnormality.  Specifically, no urinary tract calculi.

## 2023-03-12 ENCOUNTER — Encounter (HOSPITAL_COMMUNITY): Payer: Self-pay | Admitting: Licensed Clinical Social Worker

## 2023-03-12 NOTE — Progress Notes (Signed)
   THERAPIST PROGRESS NOTE  Session Time: 1:30pm-2:25pm  Participation Level: Active  Behavioral Response: Well GroomedAlertEuthymic  Type of Therapy: Individual Therapy  Treatment Goals addressed: [name] will identify patterns in relationships that are related to her level of self esteem and make choices more in line with higher levels of self esteem and self-compassion    ProgressTowards Goals: Progressing  Interventions: CBT  Summary: Kara Warren is a 67 y.o. female who presents with Adjustment Disorder with depressed mood  Suicidal/Homicidal: Nowithout intent/plan  Therapist Response: [name] engaged well in individual session with clinician. Clinician utilized CBT to process thoughts, feelings, and interactions. Clinician explored updates in relationship and plans to move in together. Clinician processed some changes, indicating some growth of her partner emotionally, particularly his actual readiness and initiation of tasks related to moving in together. Clinician explored interactions with partner's daughter over the past week and noted improvement in the sense that daughter has accepted [name] into the family and they are building a relationship outside of her partner. [name] reports overall improvement, with some slight moments of irritability and frustration or anxiety.   Plan: Return again in 2 weeks.  Diagnosis: Adjustment disorder with depressed mood  Collaboration of Care: Patient refused AEB none required  Patient/Guardian was advised Release of Information must be obtained prior to any record release in order to collaborate their care with an outside provider. Patient/Guardian was advised if they have not already done so to contact the registration department to sign all necessary forms in order for [place] to release information regarding their care.   Consent: Patient/Guardian gives verbal consent for treatment and assignment of benefits for services provided during this  visit. Patient/Guardian expressed understanding and agreed to proceed.   Chryl Heck [place], LCSW 03/12/2023

## 2023-03-20 DIAGNOSIS — R69 Illness, unspecified: Secondary | ICD-10-CM | POA: Diagnosis not present

## 2023-03-27 ENCOUNTER — Ambulatory Visit (HOSPITAL_COMMUNITY): Payer: Medicare HMO | Admitting: Licensed Clinical Social Worker

## 2023-04-10 ENCOUNTER — Other Ambulatory Visit (HOSPITAL_COMMUNITY): Payer: Self-pay

## 2023-04-10 ENCOUNTER — Other Ambulatory Visit: Payer: Self-pay | Admitting: Internal Medicine

## 2023-04-10 MED ORDER — OMEPRAZOLE 20 MG PO CPDR
20.0000 mg | DELAYED_RELEASE_CAPSULE | Freq: Every morning | ORAL | 1 refills | Status: DC
  Filled 2023-04-10: qty 90, 90d supply, fill #0
  Filled 2023-07-17: qty 90, 90d supply, fill #1

## 2023-04-13 ENCOUNTER — Ambulatory Visit: Payer: Medicare HMO | Admitting: Physician Assistant

## 2023-04-15 ENCOUNTER — Other Ambulatory Visit: Payer: Self-pay | Admitting: Cardiology

## 2023-04-15 DIAGNOSIS — I480 Paroxysmal atrial fibrillation: Secondary | ICD-10-CM

## 2023-04-15 NOTE — Progress Notes (Signed)
  Electrophysiology Office Note:   Date:  04/16/2023  ID:  Kara Warren, DOB 1956-09-16, MRN [id number]  Primary Cardiologist: Verne Carrow, MD Electrophysiologist: Regan Lemming, MD      History of Present Illness:   Kara Warren is a 67 y.o. female with h/o HTN, HLD, and PAF s/p ablation 08/01/2022 seen today for routine electrophysiology followup.  Since last being seen in our clinic the patient reports doing well.  she denies chest pain, palpitations, dyspnea, PND, orthopnea, nausea, vomiting, dizziness, syncope, edema, weight gain, or early satiety.   Review of systems complete and found to be negative unless listed in HPI.   Studies Reviewed:    EKG is ordered today. Personal review as below.  EKG Interpretation Date/Time:  Monday April 16 2023 09:30:13 EDT Ventricular Rate:  55 PR Interval:  142 QRS Duration:  92 QT Interval:  466 QTC Calculation: 445 R Axis:   63  Text Interpretation: Sinus bradycardia When compared with ECG of 16-Apr-2023 09:29, No significant change was found Confirmed by Maxine Glenn [phone] on 04/16/2023 9:49:11 AM    AF history Ablation 08/01/2022 Flecainide started 03/2022 -> stopped 04/2023 (had only been taking once daily)  Risk Assessment/Calculations:    CHA2DS2-VASc Score = 3   This indicates a 3.2% annual risk of stroke. The patient's score is based upon: CHF History: 0 HTN History: 1 Diabetes History: 0 Stroke History: 0 Vascular Disease History: 0 Age Score: 1 Gender Score: 1             Physical Exam:   VS:  BP 128/70   Pulse (!) 55   Ht 5' 5.5" (1.664 m)   Wt 141 lb (64 kg)   LMP 10/02/2006 (Approximate)   SpO2 99%   BMI 23.11 kg/m    Wt Readings from Last 3 Encounters:  04/16/23 141 lb (64 kg)  01/24/23 140 lb 12.8 oz (63.9 kg)  10/30/22 142 lb (64.4 kg)     GEN: Well nourished, well developed in no acute distress NECK: No JVD; No carotid bruits CARDIAC: Regular rate and rhythm, no  murmurs, rubs, gallops RESPIRATORY:  Clear to auscultation without rales, wheezing or rhonchi  ABDOMEN: Soft, non-tender, non-distended EXTREMITIES:  No edema; No deformity   ASSESSMENT AND PLAN:    Paroxysmal atrial fibrillation EKG today shows sinus bradycardia at 55 bpm Continue diltiazem prn Stop flecainide Continue Eliquis for CHA2DS2VASc of at least 3 and guidelines do not support the cessation of OAC.   Follow up with Dr. Elberta Fortis in 6 months  Signed, Graciella Freer, PA-C

## 2023-04-16 ENCOUNTER — Encounter: Payer: Self-pay | Admitting: Student

## 2023-04-16 ENCOUNTER — Other Ambulatory Visit (HOSPITAL_BASED_OUTPATIENT_CLINIC_OR_DEPARTMENT_OTHER): Payer: Self-pay | Admitting: Internal Medicine

## 2023-04-16 ENCOUNTER — Ambulatory Visit: Payer: Medicare HMO | Attending: Physician Assistant | Admitting: Student

## 2023-04-16 ENCOUNTER — Other Ambulatory Visit (HOSPITAL_COMMUNITY): Payer: Self-pay

## 2023-04-16 VITALS — BP 128/70 | HR 55 | Ht 65.5 in | Wt 141.0 lb

## 2023-04-16 DIAGNOSIS — I10 Essential (primary) hypertension: Secondary | ICD-10-CM

## 2023-04-16 DIAGNOSIS — Z1231 Encounter for screening mammogram for malignant neoplasm of breast: Secondary | ICD-10-CM

## 2023-04-16 DIAGNOSIS — I480 Paroxysmal atrial fibrillation: Secondary | ICD-10-CM | POA: Diagnosis not present

## 2023-04-16 MED ORDER — APIXABAN 5 MG PO TABS
5.0000 mg | ORAL_TABLET | Freq: Two times a day (BID) | ORAL | 2 refills | Status: DC
Start: 2023-04-16 — End: 2023-07-17
  Filled 2023-04-16: qty 60, 30d supply, fill #0
  Filled 2023-05-21: qty 60, 30d supply, fill #1
  Filled 2023-06-18: qty 60, 30d supply, fill #2

## 2023-04-16 NOTE — Patient Instructions (Signed)
Medication Instructions:  1.Stop flecainide *If you need a refill on your cardiac medications before your next appointment, please call your pharmacy*  Lab Work: None ordered If you have labs (blood work) drawn today and your tests are completely normal, you will receive your results only by: MyChart Message (if you have MyChart) OR A paper copy in the mail If you have any lab test that is abnormal or we need to change your treatment, we will call you to review the results.  Follow-Up: At [facility], you and your health needs are our priority.  As part of our continuing mission to provide you with exceptional heart care, we have created designated Provider Care Teams.  These Care Teams include your primary Cardiologist (physician) and Advanced Practice Providers (APPs -  Physician Assistants and Nurse Practitioners) who all work together to provide you with the care you need, when you need it.  Your next appointment:   6 month(s)  Provider:   Loman Brooklyn, MD

## 2023-04-16 NOTE — Telephone Encounter (Signed)
Prescription refill request for Eliquis received. Indication:afib Last office visit:1/24 Scr:0.81  4/24 Age: 67 Weight:63.9  kg  Prescription refilled

## 2023-04-17 ENCOUNTER — Ambulatory Visit (INDEPENDENT_AMBULATORY_CARE_PROVIDER_SITE_OTHER): Payer: Medicare HMO | Admitting: Licensed Clinical Social Worker

## 2023-04-17 DIAGNOSIS — F4321 Adjustment disorder with depressed mood: Secondary | ICD-10-CM | POA: Diagnosis not present

## 2023-04-18 ENCOUNTER — Encounter (HOSPITAL_COMMUNITY): Payer: Self-pay | Admitting: Licensed Clinical Social Worker

## 2023-04-18 NOTE — Progress Notes (Signed)
   THERAPIST PROGRESS NOTE  Session Time: 1:30pm-2:25pm  Participation Level: Active  Behavioral Response: Well GroomedAlertEuthymic  Type of Therapy: Individual Therapy  Treatment Goals addressed: Kara Warren will identify patterns in relationships that are related to her level of self esteem and make choices more in line with higher levels of self esteem and self-compassion    ProgressTowards Goals: Progressing  Interventions: CBT  Summary: Kara Warren is a 67 y.o. female who presents with Adjustment Disorder with depressed mood.   Suicidal/Homicidal: Nowithout intent/plan  Therapist Response: Kara Warren engaged well in session with clinician. Clinician utilized CBT to process thoughts, feelings, and interactions. Kara Warren shared updates in relationship, plans to move in together, house hunting, and other improvements. Clinician reflected the excitement about the progress she is making with her partner. Clinician also identified the increase in relaxed attitude about moving, partnering, and blending households. Clinician identified improvement in their communication and teamwork, particularly in house hunting and in completing tasks. Kara Warren shared that she feels very happy in this relationship and she is looking forward to an upcoming trip to meet partner's brothers in [place] this summer. Clinician explored Kara Warren's friendships with women in her life and noted that she is struggling with one friend in particular. Clinician reflected the mixed feelings with ending friendships and noted the importance of focusing on what she feels, needs, and expects of her friends.   Plan: Return again in 2-4 weeks.  Diagnosis: Adjustment disorder with depressed mood  Collaboration of Care: Patient refused AEB none required  Patient/Guardian was advised Release of Information must be obtained prior to any record release in order to collaborate their care with an outside provider. Patient/Guardian was advised if they have  not already done so to contact the registration department to sign all necessary forms in order for [place] to release information regarding their care.   Consent: Patient/Guardian gives verbal consent for treatment and assignment of benefits for services provided during this visit. Patient/Guardian expressed understanding and agreed to proceed.   Kara Warren [place], LCSW 04/18/2023

## 2023-04-30 ENCOUNTER — Ambulatory Visit: Payer: Medicare HMO | Admitting: Physician Assistant

## 2023-05-01 ENCOUNTER — Other Ambulatory Visit (HOSPITAL_COMMUNITY): Payer: Self-pay

## 2023-05-01 ENCOUNTER — Telehealth: Payer: Medicare HMO | Admitting: Family Medicine

## 2023-05-01 DIAGNOSIS — J0100 Acute maxillary sinusitis, unspecified: Secondary | ICD-10-CM

## 2023-05-01 MED ORDER — AZITHROMYCIN 250 MG PO TABS
ORAL_TABLET | ORAL | 0 refills | Status: AC
Start: 2023-05-01 — End: 2023-05-06
  Filled 2023-05-01: qty 6, 5d supply, fill #0

## 2023-05-01 NOTE — Patient Instructions (Addendum)
Kara Warren, thank you for joining Freddy Finner, NP for today's virtual visit.  While this provider is not your primary care provider (PCP), if your PCP is located in our provider database this encounter information will be shared with them immediately following your visit.   A [facility] MyChart account gives you access to today's visit and all your visits, tests, and labs performed at [facility] " click here if you don't have a [facility] MyChart account or go to [email or web address]  Consent: (Patient) Kara Warren provided verbal consent for this virtual visit at the beginning of the encounter.  Current Medications:  Current Outpatient Medications:    azithromycin (ZITHROMAX) 250 MG tablet, Take 2 tablets on day 1, then 1 tablet daily on days 2 through 5, Disp: 6 tablet, Rfl: 0   apixaban (ELIQUIS) 5 MG TABS tablet, Take 1 tablet (5 mg total) by mouth 2 (two) times daily., Disp: 60 tablet, Rfl: 2   azelastine (ASTELIN) 0.1 % nasal spray, Place 2 sprays into both nostrils 2 (two) times daily as directed, Disp: 30 mL, Rfl: 12   Calcium Carbonate-Vitamin D (CALTRATE 600+D PO), Take 1 tablet by mouth daily., Disp: , Rfl:    citalopram (CELEXA) 20 MG tablet, Take 1 tablet (20 mg total) by mouth daily., Disp: 90 tablet, Rfl: 1   fluticasone (FLONASE) 50 MCG/ACT nasal spray, Place 1-2 sprays into both nostrils daily. (Patient taking differently: Place 1-2 sprays into both nostrils as needed for allergies or rhinitis.), Disp: 16 g, Rfl: 3   ipratropium (ATROVENT) 0.03 % nasal spray, Place 2 sprays into both nostrils every 12 (twelve) hours. As needed., Disp: 30 mL, Rfl: 0   levocetirizine (XYZAL) 5 MG tablet, Take 5 mg by mouth every evening., Disp: , Rfl:    Multiple Vitamins-Minerals (MULTI FOR HER 50+) TABS, Take 1 tablet by mouth daily., Disp: , Rfl:    omeprazole (PRILOSEC) 20 MG capsule, Take 1 capsule (20 mg total) by mouth in the morning 30 minutes before  morning meal, Disp: 90 capsule, Rfl: 1   Probiotic Product (PROBIOTIC PO), Take 1 capsule by mouth daily. Ultra Flora, Disp: , Rfl:    vitamin C (ASCORBIC ACID) 500 MG tablet, Take 500 mg by mouth daily., Disp: , Rfl:    Medications ordered in this encounter:  Meds ordered this encounter  Medications   azithromycin (ZITHROMAX) 250 MG tablet    Sig: Take 2 tablets on day 1, then 1 tablet daily on days 2 through 5    Dispense:  6 tablet    Refill:  0    Order Specific Question:   Supervising Provider    Answer:   Merrilee Jansky [[id number]]     *If you need refills on other medications prior to your next appointment, please contact your pharmacy*  Follow-Up: Call back or seek an in-person evaluation if the symptoms worsen or if the condition fails to improve as anticipated.  [facility] [phone]  Other Instructions  URI recommendations- covid test in next 24 hours again- before vacation Delay Start of Rx if out of town over the weekend if not improved  - Increased rest - Increasing Fluids - Acetaminophen / ibuprofen as needed for fever/pain.  - Salt water gargling, chloraseptic spray and throat lozenges - Mucinex if mucus is present and increasing.  - Saline nasal spray if congestion or if nasal passages feel dry. - Humidifying the air.  -Add flonase to Astelin spray  If you have been instructed to have an in-person evaluation today at a local Urgent Care facility, please use the link below. It will take you to a list of all of our available [facility], including address, phone number and hours of operation. Please do not delay care.  [facility]  If you or a family member do not have a primary care provider, use the link below to schedule a visit and establish care. When you choose a [facility] primary care physician or advanced practice provider, you gain a long-term partner in health. Find a Primary Care Provider  Learn more  about [facility]'s in-office and virtual care options: [place] [facility] - Get Care Now

## 2023-05-01 NOTE — Progress Notes (Signed)
Virtual Visit Consent   HEMMA CANEL, you are scheduled for a virtual visit with a [facility] provider today. Just as with appointments in the office, your consent must be obtained to participate. Your consent will be active for this visit and any virtual visit you may have with one of our providers in the next 365 days. If you have a MyChart account, a copy of this consent can be sent to you electronically.  As this is a virtual visit, video technology does not allow for your provider to perform a traditional examination. This may limit your provider's ability to fully assess your condition. If your provider identifies any concerns that need to be evaluated in person or the need to arrange testing (such as labs, EKG, etc.), we will make arrangements to do so. Although advances in technology are sophisticated, we cannot ensure that it will always work on either your end or our end. If the connection with a video visit is poor, the visit may have to be switched to a telephone visit. With either a video or telephone visit, we are not always able to ensure that we have a secure connection.  By engaging in this virtual visit, you consent to the provision of healthcare and authorize for your insurance to be billed (if applicable) for the services provided during this visit. Depending on your insurance coverage, you may receive a charge related to this service.  I need to obtain your verbal consent now. Are you willing to proceed with your visit today? Kara Warren has provided verbal consent on 05/01/2023 for a virtual visit (video or telephone). Freddy Finner, NP  Date: 05/01/2023 2:23 PM  Virtual Visit via Video Note   I, Freddy Finner, connected with  Kara Warren  ([id number], Apr 25, 1956) on 05/01/23 at  2:30 PM EDT by a video-enabled telemedicine application and verified that I am speaking with the correct person using two identifiers.  Location: Patient: Virtual Visit Location  Patient: Home Provider: Virtual Visit Location Provider: Home Office   I discussed the limitations of evaluation and management by telemedicine and the availability of in person appointments. The patient expressed understanding and agreed to proceed.    History of Present Illness: Kara Warren is a 67 y.o. who identifies as a female who was assigned female at birth, and is being seen today for sinus congestion  Onset was Sunday afternoon- felt like an allergy attack- sneezing alot Associated symptoms are congestion in the face, and sinus pressure in her head when bending forward. Temp is 99.  Modifying factors are allergy meds and Astelin spray  Denies chest pain, shortness of breath, fevers, chills, no coughing  Going to be traveling this week- out of state and would like to have coverage   Exposure to sick contacts- unknown COVID test: negative  Vaccines: yes boosters up to date for COVID   Problems:  Patient Active Problem List   Diagnosis Date Noted   Acquired thrombophilia (HCC) 09/26/2021   Colonoscopy causing post-procedural bleeding 09/20/2020   Essential hypertension 09/20/2020   PAF (paroxysmal atrial fibrillation) (HCC) 09/20/2020   Plantar wart 04/02/2014   Peroneal tendinitis of right lower extremity 03/12/2014   Diverticulosis 07/27/2012   Sinusitis, chronic    Colon polyp    Menopause    Migraine    NONSPCIFC ABN FINDING RAD & OTH EXAM LUNG FIELD 11/19/2009   HIP PAIN, RIGHT 04/12/2009   Hyperlipidemia 08/25/2008   Depression 08/25/2008  Allergies:  Allergies  Allergen Reactions   Iohexol     Pt developed itching, redness, and hives after steroid injeciton (included lidocaine, kenalog, and Iohexol). Unsure which one caused the reaction. Pt will need 13 hr prep prior to any future Steroid injections. AV, RN   Lidocaine     Pt developed itching, redness, and hives after steroid injeciton (included lidocaine, kenalog, and Iohexol). Unsure which one  caused the reaction. Pt will need 13 hr prep prior to any future Steroid injections. AV, RN   Augmentin [Amoxicillin-Pot Clavulanate] Diarrhea   Medications:  Current Outpatient Medications:    apixaban (ELIQUIS) 5 MG TABS tablet, Take 1 tablet (5 mg total) by mouth 2 (two) times daily., Disp: 60 tablet, Rfl: 2   azelastine (ASTELIN) 0.1 % nasal spray, Place 2 sprays into both nostrils 2 (two) times daily as directed, Disp: 30 mL, Rfl: 12   Calcium Carbonate-Vitamin D (CALTRATE 600+D PO), Take 1 tablet by mouth daily., Disp: , Rfl:    citalopram (CELEXA) 20 MG tablet, Take 1 tablet (20 mg total) by mouth daily., Disp: 90 tablet, Rfl: 1   fluticasone (FLONASE) 50 MCG/ACT nasal spray, Place 1-2 sprays into both nostrils daily. (Patient taking differently: Place 1-2 sprays into both nostrils as needed for allergies or rhinitis.), Disp: 16 g, Rfl: 3   ipratropium (ATROVENT) 0.03 % nasal spray, Place 2 sprays into both nostrils every 12 (twelve) hours. As needed., Disp: 30 mL, Rfl: 0   levocetirizine (XYZAL) 5 MG tablet, Take 5 mg by mouth every evening., Disp: , Rfl:    Multiple Vitamins-Minerals (MULTI FOR HER 50+) TABS, Take 1 tablet by mouth daily., Disp: , Rfl:    omeprazole (PRILOSEC) 20 MG capsule, Take 1 capsule (20 mg total) by mouth in the morning 30 minutes before morning meal, Disp: 90 capsule, Rfl: 1   Probiotic Product (PROBIOTIC PO), Take 1 capsule by mouth daily. Ultra Flora, Disp: , Rfl:    vitamin C (ASCORBIC ACID) 500 MG tablet, Take 500 mg by mouth daily., Disp: , Rfl:   Observations/Objective: Patient is well-developed, well-nourished in no acute distress.  Resting comfortably  at home.  Head is normocephalic, atraumatic.  No labored breathing.  Speech is clear and coherent with logical content.  Patient is alert and oriented at baseline.    Assessment and Plan:  1. Acute non-recurrent maxillary sinusitis  - azithromycin (ZITHROMAX) 250 MG tablet; Take 2 tablets on day  1, then 1 tablet daily on days 2 through 5  Dispense: 6 tablet; Refill: 0  URI recommendations- covid test in next 24 hours again- before vacation Delay Start of Rx if out of town over the weekend if not improved  - Increased rest - Increasing Fluids - Acetaminophen / ibuprofen as needed for fever/pain.  - Salt water gargling, chloraseptic spray and throat lozenges - Mucinex if mucus is present and increasing.  - Saline nasal spray if congestion or if nasal passages feel dry. - Humidifying the air.  -Add flonase to Astelin spray    Reviewed side effects, risks and benefits of medication.    Patient acknowledged agreement and understanding of the plan.   Past Medical, Surgical, Social History, Allergies, and Medications have been Reviewed.   Follow Up Instructions: I discussed the assessment and treatment plan with the patient. The patient was provided an opportunity to ask questions and all were answered. The patient agreed with the plan and demonstrated an understanding of the instructions.  A copy of instructions  were sent to the patient via MyChart unless otherwise noted below.    The patient was advised to call back or seek an in-person evaluation if the symptoms worsen or if the condition fails to improve as anticipated.  Time:  I spent 10 minutes with the patient via telehealth technology discussing the above problems/concerns.    Freddy Finner, NP

## 2023-05-09 ENCOUNTER — Encounter: Payer: Self-pay | Admitting: Internal Medicine

## 2023-05-15 ENCOUNTER — Ambulatory Visit (INDEPENDENT_AMBULATORY_CARE_PROVIDER_SITE_OTHER): Payer: Medicare HMO | Admitting: Licensed Clinical Social Worker

## 2023-05-15 DIAGNOSIS — F4321 Adjustment disorder with depressed mood: Secondary | ICD-10-CM

## 2023-05-17 DIAGNOSIS — R140 Abdominal distension (gaseous): Secondary | ICD-10-CM | POA: Diagnosis not present

## 2023-05-17 DIAGNOSIS — K580 Irritable bowel syndrome with diarrhea: Secondary | ICD-10-CM | POA: Diagnosis not present

## 2023-05-17 DIAGNOSIS — Z86010 Personal history of colonic polyps: Secondary | ICD-10-CM | POA: Diagnosis not present

## 2023-05-17 DIAGNOSIS — K219 Gastro-esophageal reflux disease without esophagitis: Secondary | ICD-10-CM | POA: Diagnosis not present

## 2023-05-21 ENCOUNTER — Other Ambulatory Visit (HOSPITAL_COMMUNITY): Payer: Self-pay

## 2023-05-22 DIAGNOSIS — R69 Illness, unspecified: Secondary | ICD-10-CM | POA: Diagnosis not present

## 2023-05-29 ENCOUNTER — Encounter (HOSPITAL_COMMUNITY): Payer: Self-pay | Admitting: Licensed Clinical Social Worker

## 2023-05-29 NOTE — Progress Notes (Signed)
   THERAPIST PROGRESS NOTE  Session Time: 12:30pm-1:25pm  Participation Level: Active  Behavioral Response: Well GroomedAlertEuthymic  Type of Therapy: Individual Therapy  Treatment Goals addressed:  Constanza will identify patterns in relationships that are related to her level of self esteem and make choices more in line with higher levels of self esteem and self-compassion    ProgressTowards Goals: Progressing  Interventions: CBT  Summary: [name] is a 67 y.o. female who presents with Adjustment Disorder with depressed mood.   Suicidal/Homicidal: Nowithout intent/plan  Therapist Response: Deana engaged well in individual session with clinician. Clinician utilized CBT to process thoughts, feelings, and behaviors. Clinician explored updates in relationship and noted increase in engagement from Catheryne's partner in regards to moving in together. She shared updates she has made in her home, as well as recent conversations with financial planners and realtors. Clinician discussed the change in Venetta's attitude about the whole thing, which has helped her feel confident in the relationship. Clinician discussed travels to visit partner's family, which went very well, better than she'd expected. She shared that this is what helped her partner get more serious about moving in.   Plan: Return again in 2 weeks.  Diagnosis: Adjustment disorder with depressed mood  Collaboration of Care: Patient refused AEB none required  Patient/Guardian was advised Release of Information must be obtained prior to any record release in order to collaborate their care with an outside provider. Patient/Guardian was advised if they have not already done so to contact the registration department to sign all necessary forms in order for [place] to release information regarding their care.   Consent: Patient/Guardian gives verbal consent for treatment and assignment of benefits for services provided during this visit.  Patient/Guardian expressed understanding and agreed to proceed.   Chryl Heck [place], LCSW 05/29/2023

## 2023-06-02 DIAGNOSIS — Z0100 Encounter for examination of eyes and vision without abnormal findings: Secondary | ICD-10-CM | POA: Diagnosis not present

## 2023-06-02 DIAGNOSIS — H524 Presbyopia: Secondary | ICD-10-CM | POA: Diagnosis not present

## 2023-06-05 ENCOUNTER — Inpatient Hospital Stay (HOSPITAL_BASED_OUTPATIENT_CLINIC_OR_DEPARTMENT_OTHER): Admission: RE | Admit: 2023-06-05 | Payer: Medicare HMO | Source: Ambulatory Visit | Admitting: Radiology

## 2023-06-05 ENCOUNTER — Encounter (HOSPITAL_COMMUNITY): Payer: Self-pay | Admitting: Licensed Clinical Social Worker

## 2023-06-05 ENCOUNTER — Ambulatory Visit (INDEPENDENT_AMBULATORY_CARE_PROVIDER_SITE_OTHER): Payer: Medicare HMO | Admitting: Licensed Clinical Social Worker

## 2023-06-05 DIAGNOSIS — F4321 Adjustment disorder with depressed mood: Secondary | ICD-10-CM

## 2023-06-05 NOTE — Progress Notes (Signed)
   THERAPIST PROGRESS NOTE  Session Time: 2:30pm-3:25pm  Participation Level: Active  Behavioral Response: Well GroomedAlertEuthymic  Type of Therapy: Individual Therapy  Treatment Goals addressed: [name] will identify patterns in relationships that are related to her level of self esteem and make choices more in line with higher levels of self esteem and self-compassion      ProgressTowards Goals: Progressing  Interventions: Motivational Interviewing  Summary: Kara Warren is a 67 y.o. female who presents with Adjustment disorder with depressed mood.   Suicidal/Homicidal: Nowithout intent/plan  Therapist Response: Kara Warren engaged well in individual session with clinician. Clinician utilized MI OARS to reflect and summarize thoughts and feelings. Clinician processed updates in relationship, including plans to move in together. Kara Warren shared that her partner is becoming more intentional about sharing space with her, working well with her to plan and sort through things, and in looking at houses together. Kara Warren processed the experience of seeing herself through her partner's eyes, which has improved her self esteem, allowed her to feel safe in the relationship, and given her a partner in life. She shared this has been great and she also reports that she is using her communication skills to ensure that her point is understood.   Plan: Return again in 2 weeks.  Diagnosis: Adjustment disorder with depressed mood  Collaboration of Care: Patient refused AEB none required  Patient/Guardian was advised Release of Information must be obtained prior to any record release in order to collaborate their care with an outside provider. Patient/Guardian was advised if they have not already done so to contact the registration department to sign all necessary forms in order for [place] to release information regarding their care.   Consent: Patient/Guardian gives verbal consent for treatment and assignment of  benefits for services provided during this visit. Patient/Guardian expressed understanding and agreed to proceed.   Chryl Heck [place], LCSW 06/05/2023

## 2023-06-08 DIAGNOSIS — H269 Unspecified cataract: Secondary | ICD-10-CM | POA: Diagnosis not present

## 2023-06-08 DIAGNOSIS — M8580 Other specified disorders of bone density and structure, unspecified site: Secondary | ICD-10-CM | POA: Diagnosis not present

## 2023-06-08 DIAGNOSIS — F325 Major depressive disorder, single episode, in full remission: Secondary | ICD-10-CM | POA: Diagnosis not present

## 2023-06-08 DIAGNOSIS — Z87891 Personal history of nicotine dependence: Secondary | ICD-10-CM | POA: Diagnosis not present

## 2023-06-08 DIAGNOSIS — Z8249 Family history of ischemic heart disease and other diseases of the circulatory system: Secondary | ICD-10-CM | POA: Diagnosis not present

## 2023-06-08 DIAGNOSIS — J302 Other seasonal allergic rhinitis: Secondary | ICD-10-CM | POA: Diagnosis not present

## 2023-06-08 DIAGNOSIS — I4891 Unspecified atrial fibrillation: Secondary | ICD-10-CM | POA: Diagnosis not present

## 2023-06-08 DIAGNOSIS — Z7901 Long term (current) use of anticoagulants: Secondary | ICD-10-CM | POA: Diagnosis not present

## 2023-06-08 DIAGNOSIS — D6869 Other thrombophilia: Secondary | ICD-10-CM | POA: Diagnosis not present

## 2023-06-08 DIAGNOSIS — Z008 Encounter for other general examination: Secondary | ICD-10-CM | POA: Diagnosis not present

## 2023-06-08 DIAGNOSIS — Z880 Allergy status to penicillin: Secondary | ICD-10-CM | POA: Diagnosis not present

## 2023-06-08 DIAGNOSIS — K589 Irritable bowel syndrome without diarrhea: Secondary | ICD-10-CM | POA: Diagnosis not present

## 2023-06-18 ENCOUNTER — Other Ambulatory Visit (HOSPITAL_COMMUNITY): Payer: Self-pay

## 2023-06-19 ENCOUNTER — Other Ambulatory Visit (HOSPITAL_COMMUNITY): Payer: Self-pay

## 2023-06-19 ENCOUNTER — Encounter: Payer: Self-pay | Admitting: Family Medicine

## 2023-06-19 ENCOUNTER — Telehealth (INDEPENDENT_AMBULATORY_CARE_PROVIDER_SITE_OTHER): Payer: Medicare HMO | Admitting: Family Medicine

## 2023-06-19 ENCOUNTER — Ambulatory Visit (HOSPITAL_COMMUNITY): Payer: Medicare HMO | Admitting: Licensed Clinical Social Worker

## 2023-06-19 VITALS — Ht 65.5 in

## 2023-06-19 DIAGNOSIS — I480 Paroxysmal atrial fibrillation: Secondary | ICD-10-CM | POA: Diagnosis not present

## 2023-06-19 DIAGNOSIS — J069 Acute upper respiratory infection, unspecified: Secondary | ICD-10-CM | POA: Diagnosis not present

## 2023-06-19 DIAGNOSIS — Z20822 Contact with and (suspected) exposure to covid-19: Secondary | ICD-10-CM

## 2023-06-19 MED ORDER — BENZONATATE 100 MG PO CAPS
200.0000 mg | ORAL_CAPSULE | Freq: Two times a day (BID) | ORAL | 0 refills | Status: AC | PRN
Start: 2023-06-19 — End: 2023-06-29
  Filled 2023-06-19 – 2023-06-20 (×2): qty 30, 8d supply, fill #0

## 2023-06-19 MED ORDER — NIRMATRELVIR/RITONAVIR (PAXLOVID)TABLET
3.0000 | ORAL_TABLET | Freq: Two times a day (BID) | ORAL | 0 refills | Status: AC
Start: 2023-06-19 — End: 2023-06-25
  Filled 2023-06-19 – 2023-06-20 (×2): qty 30, 5d supply, fill #0

## 2023-06-19 NOTE — Progress Notes (Signed)
Virtual Visit via Video Note I connected with Kara Warren on 06/19/2023 by a video enabled telemedicine application and verified that I am speaking with the correct person using two identifiers. Location patient: home Location provider:work office Persons participating in the virtual visit: patient, provider  I discussed the limitations of evaluation and management by telemedicine and the availability of in person appointments. The patient expressed understanding and agreed to proceed.  Chief Complaint  Patient presents with   Sore Throat        chest congestion   HPI: Kara Warren is a 67 yo female with a PMHx of PAF, HTN,HLD, and depression c/o respiratory symptoms following covid exposure.  She states she was exposed to covid from her boyfriend who developed cold symptoms on 9/12. She states his symptoms worsened on 9/13 and he tested positive for covid on 9/14. She states she developed sx including sore throat, headaches, nasal congestion, rhinorrhea,dry cough (non-productive), and fatigue which developed the night of 9/16.  She denies fever, chills, body aches, SOB, wheezing, CP, abd pain, n/v/d, urinary symptoms, or skin rash. She states that she has tested negative for covid this morning and again this afternoon. Hx of seasonal allergies on Flonase nasal spray and Astelin nasal spray.   Atrial fib on Eliquis 5 mg twice daily, status post ablation. She states that as far as she knows, she is still in sinus rhythm but was instructed to stay on Eliquis "for the rest" of her life. She follows with cardiology regularly.  ROS: See pertinent positives and negatives per HPI.  Past Medical History:  Diagnosis Date   Abdominal pain, left upper quadrant    Acute bronchospasm    Acute neck pain 07/02/2019   Acute sinusitis, unspecified 05/20/2009   Centricity Description: SINUSITIS - ACUTE-NOS Qualifier: Diagnosis of  By: Beverely Low MD, Natalia Leatherwood   Centricity Description: ACUTE  SINUSITIS, UNSPECIFIED Qualifier: Diagnosis of  By: Thurmond Butts MD, Dennard Nip   Centricity Description: SINUSITIS, ACUTE Qualifier: Diagnosis of  By: Orson Aloe MD, Tinnie Gens     Anxiety    Atypical chest pain 03/05/2013   Body mass index (BMI) of 23.0-23.9 in adult    Cervicalgia    Colon polyp    Costochondral junction syndrome    Cough    DEPRESSION 08/25/2008   Qualifier: Diagnosis of  By: Yetta Barre CMA, Chemira     Diarrhea    Diverticulosis 07/27/2012   Dorsalgia    Dysrhythmia    hx a-fib   Elevated blood pressure reading without diagnosis of hypertension    Endometrial polyp 05/2007   benign   Essential hypertension 09/20/2020   Family history of osteoporosis 12/28/2011   Mother pt had normal Dexa 11/2009    Fatigue    GERD (gastroesophageal reflux disease)    HIP PAIN, RIGHT 04/12/2009   Qualifier: Diagnosis of  By: Darrick Penna MD, KARL     History of hiatal hernia    HYPERLIPIDEMIA 08/25/2008   Qualifier: Diagnosis of  By: Beverely Low MD, Natalia Leatherwood     IBS (irritable bowel syndrome)    Left hip pain 07/30/2013   Left wrist pain 07/30/2013   Lumbar strain, initial encounter 07/28/2019   LUNG NODULE 11/19/2009   Neg CT     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 Please review for accuracy.   Otalgia, unspecified ear    Pain in throat    Palpitations    Paroxysmal atrial fibrillation (HCC)    Peroneal tendinitis of right lower extremity 03/12/2014   Plantar wart 04/02/2014   Pneumonia    RHINITIS 06/21/2009    Qualifier: Diagnosis of  By: Beverely Low MD, Natalia Leatherwood     Right ankle injury, subsequent encounter 01/11/2018   Right foot pain 08/09/2011   Sinusitis, chronic    Skene's gland abscess 2009   patient unaware   Sprain of unspecified ligament of right ankle, subsequent encounter    TRANSAMINASES, SERUM, ELEVATED 12/07/2009   Qualifier: Diagnosis of  By: Peggyann Juba FNP, Melissa S    Unspecified cataract    Urinary tract infection, site not specified    Vitamin D deficiency     Past Surgical History:  Procedure Laterality Date   ATRIAL FIBRILLATION ABLATION N/A 08/01/2022   Procedure: ATRIAL FIBRILLATION ABLATION;  Surgeon: Regan Lemming, MD;  Location: [facility];  Service: Cardiovascular;  Laterality: N/A;   BROW LIFT Bilateral 12/29/2021   Procedure: BLEPHAROPLASTY;  Surgeon: Janne Napoleon, MD;  Location: [facility];  Service: Plastics;  Laterality: Bilateral;   COLONOSCOPY     x several   DILATION AND CURETTAGE OF UTERUS  05/2007   GYNECOLOGIC CRYOSURGERY  1980   HYSTEROSCOPY WITH D & C N/A 03/30/2017   Procedure: DILATATION & CURETTAGE/HYSTEROSCOPY WITH ULTRASOUND GUIDANCE;  Surgeon: Jerene Bears, MD;  Location: [facility];  Service: Gynecology;  Laterality: N/A;   UPPER GI ENDOSCOPY     uterine ablation  2007    Family History  Problem Relation Age of Onset   Colon polyps Father    Ulcerative colitis Father    CAD Father        Stent placement at age 107   Glaucoma Father    Hypertension Father    Colitis Father    Breast cancer Paternal Grandmother    Colon cancer Maternal Grandfather    Colon polyps Mother    Osteoporosis Mother    Thyroid disease Mother        hypo   Glaucoma Mother    Diverticulosis Mother    Sudden death Neg Hx    Hyperlipidemia Neg Hx    Heart attack Neg Hx    Diabetes Neg Hx     Social History   Socioeconomic History   Marital status: Significant Other    Spouse name: Not on file   Number of children: 0   Years of education: Not  on file   Highest education level: Not on file  Occupational History   Occupation: Charity fundraiser    Employer: [employer]  Tobacco Use   Smoking status: Former    Current packs/day: 0.00    Average packs/day: 0.1 packs/day for 3.0 years (0.3 ttl pk-yrs)    Types: Cigarettes    Start date: 10/03/1975    Quit date: 10/02/1978    Years since quitting: 44.7   Smokeless tobacco: Never   Tobacco comments:    Former smoker 08/29/22  Vaping Use   Vaping status: Never Used  Substance and Sexual Activity   Alcohol use:  Yes    Alcohol/week: 7.0 standard drinks of alcohol    Types: 7 Glasses of wine per week    Comment: per week   Drug use: No   Sexual activity: Not Currently    Birth control/protection: Post-menopausal  Other Topics Concern   Not on file  Social History Narrative   Not on file   Social Determinants of Health   Financial Resource Strain: Low Risk  (01/23/2023)   Overall Financial Resource Strain (CARDIA)    Difficulty of Paying Living Expenses: Not very hard  Food Insecurity: No Food Insecurity (01/23/2023)   Hunger Vital Sign    Worried About Running Out of Food in the Last Year: Never true    Ran Out of Food in the Last Year: Never true  Transportation Needs: No Transportation Needs (01/23/2023)   PRAPARE - Administrator, Civil Service (Medical): No    Lack of Transportation (Non-Medical): No  Physical Activity: Sufficiently Active (01/23/2023)   Exercise Vital Sign    Days of Exercise per Week: 7 days    Minutes of Exercise per Session: 60 min  Stress: No Stress Concern Present (01/23/2023)   [employer] of Occupational Health - Occupational Stress Questionnaire    Feeling of Stress : Not at all  Social Connections: Moderately Integrated (01/23/2023)   Social Connection and Isolation Panel [NHANES]    Frequency of Communication with Friends and Family: More than three times a week    Frequency of Social Gatherings with Friends and Family: Three times a week     Attends Religious Services: 1 to 4 times per year    Active Member of Clubs or Organizations: Yes    Attends Banker Meetings: 1 to 4 times per year    Marital Status: Divorced  Intimate Partner Violence: Not At Risk (01/23/2023)   Humiliation, Afraid, Rape, and Kick questionnaire    Fear of Current or Ex-Partner: No    Emotionally Abused: No    Physically Abused: No    Sexually Abused: No     Current Outpatient Medications:    apixaban (ELIQUIS) 5 MG TABS tablet, Take 1 tablet (5 mg total) by mouth 2 (two) times daily., Disp: 60 tablet, Rfl: 2   azelastine (ASTELIN) 0.1 % nasal spray, Place 2 sprays into both nostrils 2 (two) times daily as directed, Disp: 30 mL, Rfl: 12   benzonatate (TESSALON) 100 MG capsule, Take 2 capsules (200 mg total) by mouth 2 (two) times daily as needed for up to 10 days., Disp: 30 capsule, Rfl: 0   Calcium Carbonate-Vitamin D (CALTRATE 600+D PO), Take 1 tablet by mouth daily., Disp: , Rfl:    citalopram (CELEXA) 20 MG tablet, Take 1 tablet (20 mg total) by mouth daily., Disp: 90 tablet, Rfl: 1   fluticasone (FLONASE) 50 MCG/ACT nasal spray, Place 1-2 sprays into both nostrils daily. (Patient taking differently: Place 1-2 sprays into both nostrils as needed for allergies or rhinitis.), Disp: 16 g, Rfl: 3   ipratropium (ATROVENT) 0.03 % nasal spray, Place 2 sprays into both nostrils every 12 (twelve) hours. As needed., Disp: 30 mL, Rfl: 0   levocetirizine (XYZAL) 5 MG tablet, Take 5 mg by mouth every evening., Disp: , Rfl:    Multiple Vitamins-Minerals (MULTI FOR HER 50+) TABS, Take 1 tablet by mouth daily., Disp: , Rfl:    nirmatrelvir/ritonavir (PAXLOVID) 20 x 150 MG & 10 x 100MG  TABS, Take 3 tablets by mouth 2 (two) times daily for  5 days. (Take nirmatrelvir 150 mg two tablets twice daily for 5 days and ritonavir 100 mg one tablet twice daily for 5 days) Patient GFR is 79 in 01/2023., Disp: 30 tablet, Rfl: 0   omeprazole (PRILOSEC) 20 MG capsule,  Take 1 capsule (20 mg total) by mouth in the morning 30 minutes before morning meal, Disp: 90 capsule, Rfl: 1   Probiotic Product (PROBIOTIC PO), Take 1 capsule by mouth daily. Ultra Flora, Disp: , Rfl:    vitamin C (ASCORBIC ACID) 500 MG tablet, Take 500 mg by mouth daily., Disp: , Rfl:   EXAM:  VITALS per patient if applicable:Ht 5' 5.5" (1.664 m)   LMP 10/02/2006 (Approximate)   BMI 23.11 kg/m   GENERAL: alert, oriented, appears well and in no acute distress  HEENT: atraumatic, conjunctiva clear, no obvious abnormalities on inspection of external nose and ears  NECK: normal movements of the head and neck  LUNGS: on inspection no signs of respiratory distress, breathing rate appears normal, no obvious gross SOB, gasping or wheezing  CV: no obvious cyanosis  MS: moves all visible extremities without noticeable abnormality  PSYCH/NEURO: pleasant and cooperative, no obvious depression or anxiety, speech and thought processing grossly intact  ASSESSMENT AND PLAN:  Discussed the following assessment and plan:  URI, acute Hx suggest a viral etiology, seasonal allergies also in the differential Dx's. Recommend symptomatic treatment. Monitor for new symptoms. Tylenol 500 mg 3-4 times per day if needed. Continue Flonase nasal spray and Astelin nasal spray as needed for nasal congestion and rhinorrhea. Sent prescription for benzonatate in case cough gets worse. Instructed about warning signs.  -     nirmatrelvir/ritonavir; Take 3 tablets by mouth 2 (two) times daily for 5 days. (Take nirmatrelvir 150 mg two tablets twice daily for 5 days and ritonavir 100 mg one tablet twice daily for 5 days) Patient GFR is 79 in 01/2023.  Dispense: 30 tablet; Refill: 0 -     Benzonatate; Take 2 capsules (200 mg total) by mouth 2 (two) times daily as needed for up to 10 days.  Dispense: 30 capsule; Refill: 0  Close exposure to COVID-19 virus So far she has had negative COVID 19 home test. Recommend  re-testing tomorrow. If positive she can go a head and fill Rx for Paxlovid. Some side effects discussed. We discussed symptoms and recommendations in regard to quarantine.  -     nirmatrelvir/ritonavir; Take 3 tablets by mouth 2 (two) times daily for 5 days. (Take nirmatrelvir 150 mg two tablets twice daily for 5 days and ritonavir 100 mg one tablet twice daily for 5 days) Patient GFR is 79 in 01/2023.  Dispense: 30 tablet; Refill: 0  PAF (paroxysmal atrial fibrillation) (HCC) S/P ablation in 07/2022. It seem like Paxlovid could increase Eliquis activity due to its strong CYP3A4 activity. I told her she may need to decrease dose of Eliquis by 50% if she started taking Paxlovid. I sent a message to cardiologist's office, Maxine Glenn.  We discussed possible serious and likely etiologies, options for evaluation and workup, limitations of telemedicine visit vs in person visit, treatment, treatment risks and precautions. The patient was advised to call back or seek an in-person evaluation if the symptoms worsen or if the condition fails to improve as anticipated. I discussed the assessment and treatment plan with the patient. The patient was provided an opportunity to ask questions and all were answered. The patient agreed with the plan and demonstrated an understanding of the instructions.  Return if symptoms worsen or fail to improve.  I,Rachel Rivera,acting as a scribe for Dorla Guizar [place], MD.,have documented all relevant documentation on the behalf of Deago Burruss [place], MD,as directed by  Akil Hoos [place], MD while in the presence of Kieley Akter [place], MD.  I, Nikiya Starn [place], MD, have reviewed all documentation for this visit. The documentation on 06/19/23 for the exam, diagnosis, procedures, and orders are all accurate and complete.  Asta Corbridge [place], MD

## 2023-06-20 ENCOUNTER — Encounter: Payer: Self-pay | Admitting: Cardiology

## 2023-06-20 ENCOUNTER — Telehealth: Payer: Self-pay | Admitting: Cardiology

## 2023-06-20 ENCOUNTER — Other Ambulatory Visit (HOSPITAL_COMMUNITY): Payer: Self-pay

## 2023-06-20 NOTE — Telephone Encounter (Signed)
Pt advised and agreeable to plan.

## 2023-06-20 NOTE — Telephone Encounter (Signed)
See today's telephone note.

## 2023-06-20 NOTE — Telephone Encounter (Signed)
Pt is requesting a callback regarding her MyChart message she sent this morning "Hi, I have COVID and have been prescribed Paxlovid. Is there a reason to decrease my Eliquis?" Please advise.

## 2023-06-20 NOTE — Telephone Encounter (Signed)
Yes pt will need to decrease Eliquis while on Paxlovid due to drug interaction. She should take 1/2 tablet (2.5mg ) of Eliquis twice daily for the 5 days that she is on Paxlovid, then resume normal dosing after.  Paxlovid also interacts with her Flonase. She should not use Flonase while on Paxlovid.

## 2023-06-21 ENCOUNTER — Ambulatory Visit (HOSPITAL_BASED_OUTPATIENT_CLINIC_OR_DEPARTMENT_OTHER): Payer: Medicare HMO | Admitting: Radiology

## 2023-06-21 ENCOUNTER — Encounter: Payer: Self-pay | Admitting: Internal Medicine

## 2023-06-30 ENCOUNTER — Encounter: Payer: Self-pay | Admitting: Internal Medicine

## 2023-07-02 ENCOUNTER — Ambulatory Visit: Payer: Medicare HMO

## 2023-07-05 ENCOUNTER — Encounter: Payer: Self-pay | Admitting: Cardiovascular Disease

## 2023-07-09 ENCOUNTER — Ambulatory Visit (INDEPENDENT_AMBULATORY_CARE_PROVIDER_SITE_OTHER): Payer: Medicare HMO

## 2023-07-09 DIAGNOSIS — Z23 Encounter for immunization: Secondary | ICD-10-CM

## 2023-07-11 ENCOUNTER — Ambulatory Visit
Admission: RE | Admit: 2023-07-11 | Discharge: 2023-07-11 | Disposition: A | Discharge status: Home / Self Care | Payer: Medicare HMO | Source: Ambulatory Visit | Attending: Family Medicine | Admitting: Family Medicine

## 2023-07-11 ENCOUNTER — Ambulatory Visit (INDEPENDENT_AMBULATORY_CARE_PROVIDER_SITE_OTHER): Payer: Medicare HMO | Admitting: Family Medicine

## 2023-07-11 ENCOUNTER — Other Ambulatory Visit: Payer: Self-pay

## 2023-07-11 ENCOUNTER — Other Ambulatory Visit (HOSPITAL_COMMUNITY): Payer: Self-pay

## 2023-07-11 VITALS — BP 152/81 | Ht 65.0 in | Wt 132.0 lb

## 2023-07-11 DIAGNOSIS — S39012A Strain of muscle, fascia and tendon of lower back, initial encounter: Secondary | ICD-10-CM | POA: Diagnosis not present

## 2023-07-11 DIAGNOSIS — M545 Low back pain, unspecified: Secondary | ICD-10-CM | POA: Diagnosis not present

## 2023-07-11 MED ORDER — METHYLPREDNISOLONE ACETATE 40 MG/ML IJ SUSP
80.0000 mg | Freq: Once | INTRAMUSCULAR | Status: AC
  Administered 2023-07-11: 80 mg via INTRAMUSCULAR

## 2023-07-11 MED ORDER — TIZANIDINE HCL 4 MG PO TABS
4.0000 mg | ORAL_TABLET | Freq: Three times a day (TID) | ORAL | 1 refills | Status: DC | PRN
  Filled 2023-07-11: qty 60, 20d supply, fill #0

## 2023-07-11 NOTE — Patient Instructions (Signed)
Get x-rays after you leave today. You were given an injection today. Assuming the x-rays are normal, you have a lumbar strain. Ok to take tylenol for baseline pain relief (1-2 extra strength tabs 3x/day) Tizanidine as needed for muscle spasms (no driving on this medicine if it makes you sleepy). Topical capsaicin, biofreeze, or aspercreme may help. Heat and epsom salt baths for comfort. Stay as active as possible. Easy stretching but don't force it beyond a mild discomfort. Follow up with me in 2 weeks.

## 2023-07-12 ENCOUNTER — Encounter: Payer: Self-pay | Admitting: Family Medicine

## 2023-07-12 NOTE — Progress Notes (Signed)
PCP: Philip Aspen, Limmie Patricia, MD  Subjective:   HPI: Patient is a 67 y.o. female here for low back pain.  Patient reports having worsening left low back pain since this morning. She went to get up and felt a sharp pain here. No radiation into legs. No nubmness/tingling. No bowel/bladder dysfunction. Took 2 ibuprofen.  Past Medical History:  Diagnosis Date   Abdominal pain, left upper quadrant    Acute bronchospasm    Acute neck pain 07/02/2019   Acute sinusitis, unspecified 05/20/2009   Centricity Description: SINUSITIS - ACUTE-NOS Qualifier: Diagnosis of  By: Beverely Low MD, Natalia Leatherwood   Centricity Description: ACUTE SINUSITIS, UNSPECIFIED Qualifier: Diagnosis of  By: Thurmond Butts MD, Eugene   Centricity Description: SINUSITIS, ACUTE Qualifier: Diagnosis of  By: Orson Aloe MD, Tinnie Gens     Anxiety    Atypical chest pain 03/05/2013   Body mass index (BMI) of 23.0-23.9 in adult    Cervicalgia    Colon polyp    Costochondral junction syndrome    Cough    DEPRESSION 08/25/2008   Qualifier: Diagnosis of  By: Yetta Barre CMA, Chemira     Diarrhea    Diverticulosis 07/27/2012   Dorsalgia    Dysrhythmia    hx a-fib   Elevated blood pressure reading without diagnosis of hypertension    Endometrial polyp 05/2007   benign   Essential hypertension 09/20/2020   Family history of osteoporosis 12/28/2011   Mother pt had normal Dexa 11/2009    Fatigue    GERD (gastroesophageal reflux disease)    HIP PAIN, RIGHT 04/12/2009   Qualifier: Diagnosis of  By: Darrick Penna MD, KARL     History of hiatal hernia    HYPERLIPIDEMIA 08/25/2008   Qualifier: Diagnosis of  By: Beverely Low MD, Natalia Leatherwood     IBS (irritable bowel syndrome)    Left hip pain 07/30/2013   Left wrist pain 07/30/2013   Lumbar strain, initial encounter 07/28/2019   LUNG NODULE 11/19/2009   Neg CT     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 Please review for accuracy.   Otalgia, unspecified ear    Pain in throat    Palpitations    Paroxysmal atrial fibrillation (HCC)    Peroneal tendinitis of right lower extremity 03/12/2014   Plantar wart 04/02/2014   Pneumonia    RHINITIS 06/21/2009   Qualifier: Diagnosis of  By: Beverely Low MD, Natalia Leatherwood     Right ankle injury, subsequent encounter 01/11/2018   Right foot pain 08/09/2011   Sinusitis, chronic    Skene's gland abscess 2009   patient unaware   Sprain of unspecified ligament of right ankle, subsequent encounter    TRANSAMINASES, SERUM, ELEVATED 12/07/2009   Qualifier: Diagnosis of  By: Peggyann Juba FNP, Melissa S    Unspecified cataract    Urinary tract infection, site not specified    Vitamin D deficiency     Current Outpatient Medications on File Prior to Visit  Medication Sig Dispense Refill   apixaban (ELIQUIS) 5  MG TABS tablet Take 1 tablet (5 mg total) by mouth 2 (two) times daily. 60 tablet 2   azelastine (ASTELIN) 0.1 % nasal spray Place 2 sprays into both nostrils 2 (two) times daily as directed 30 mL 12   Calcium Carbonate-Vitamin D (CALTRATE 600+D PO) Take 1 tablet by mouth daily.     citalopram (CELEXA) 20 MG tablet Take 1 tablet (20 mg total) by mouth daily. 90 tablet 1   fluticasone (FLONASE) 50 MCG/ACT nasal spray Place 1-2 sprays into both nostrils daily. (Patient taking differently: Place 1-2 sprays into both nostrils as needed for allergies or rhinitis.) 16 g 3   ipratropium (ATROVENT) 0.03 % nasal spray Place 2 sprays into both nostrils every 12 (twelve) hours. As needed. 30 mL 0    levocetirizine (XYZAL) 5 MG tablet Take 5 mg by mouth every evening.     Multiple Vitamins-Minerals (MULTI FOR HER 50+) TABS Take 1 tablet by mouth daily.     omeprazole (PRILOSEC) 20 MG capsule Take 1 capsule (20 mg total) by mouth in the morning 30 minutes before morning meal 90 capsule 1   Probiotic Product (PROBIOTIC PO) Take 1 capsule by mouth daily. Ultra Flora     vitamin C (ASCORBIC ACID) 500 MG tablet Take 500 mg by mouth daily.     No current facility-administered medications on file prior to visit.    Past Surgical History:  Procedure Laterality Date   ATRIAL FIBRILLATION ABLATION N/A 08/01/2022   Procedure: ATRIAL FIBRILLATION ABLATION;  Surgeon: Regan Lemming, MD;  Location: [facility];  Service: Cardiovascular;  Laterality: N/A;   BROW LIFT Bilateral 12/29/2021   Procedure: BLEPHAROPLASTY;  Surgeon: Janne Napoleon, MD;  Location: [facility];  Service: Plastics;  Laterality: Bilateral;   COLONOSCOPY     x several   DILATION AND CURETTAGE OF UTERUS  05/2007   GYNECOLOGIC CRYOSURGERY  1980   HYSTEROSCOPY WITH D & C N/A 03/30/2017   Procedure: DILATATION & CURETTAGE/HYSTEROSCOPY WITH ULTRASOUND GUIDANCE;  Surgeon: Jerene Bears, MD;  Location: [facility];  Service: Gynecology;  Laterality: N/A;   UPPER GI ENDOSCOPY     uterine ablation  2007    Allergies  Allergen Reactions   Iohexol     Pt developed itching, redness, and hives after steroid injeciton (included lidocaine, kenalog, and Iohexol). Unsure which one caused the reaction. Pt will need 13 hr prep prior to any future Steroid injections. AV, RN   Lidocaine     Pt developed itching, redness, and hives after steroid injeciton (included lidocaine, kenalog, and Iohexol). Unsure which one caused the reaction. Pt will need 13 hr prep prior to any future Steroid injections. AV, RN   Augmentin [Amoxicillin-Pot Clavulanate] Diarrhea    BP (!) 152/81   Ht 5\' 5"  (1.651 m)   Wt 132 lb (59.9 kg)   LMP 10/02/2006  (Approximate)   BMI 21.97 kg/m      08/30/2020    9:03 AM 06/08/2021    9:34 AM 08/22/2021   10:20 AM 11/09/2021   10:19 AM  [facility] Adult Exercise  Frequency of aerobic exercise (# of days/week) 7 7 7 6   Average time in minutes 60 60 60 60  Frequency of strengthening activities (# of days/week) 3 3 2 3         No data to display              Objective:  Physical Exam:  Gen: NAD, comfortable in exam room  Back: No gross deformity, scoliosis. TTP left lumbar paraspinal region and in midline though no bony stepoffs. Flexion to 80 degrees.  Extension 5 degrees and painful. Strength LEs 5/5 all muscle groups.   2+ MSRs in patellar and achilles tendons, equal bilaterally. Negative SLRs. Sensation intact to light touch bilaterally.   Assessment & Plan:  1. Low back pain - likely lumbar strain though she does have midline tenderness.  Will go ahead with x-rays.  IM depomedrol injection given today.  Limited in what she can take due to eliquis.  Tizanidine as needed.  Heat and epsom salt baths, topical medications.  Home stretches reviewed f/u in 2 weeks.

## 2023-07-17 ENCOUNTER — Other Ambulatory Visit: Payer: Self-pay

## 2023-07-17 ENCOUNTER — Other Ambulatory Visit: Payer: Self-pay | Admitting: Cardiology

## 2023-07-17 ENCOUNTER — Other Ambulatory Visit: Payer: Self-pay | Admitting: Internal Medicine

## 2023-07-17 ENCOUNTER — Other Ambulatory Visit (HOSPITAL_COMMUNITY): Payer: Self-pay

## 2023-07-17 DIAGNOSIS — F330 Major depressive disorder, recurrent, mild: Secondary | ICD-10-CM

## 2023-07-17 DIAGNOSIS — I480 Paroxysmal atrial fibrillation: Secondary | ICD-10-CM

## 2023-07-17 MED ORDER — APIXABAN 5 MG PO TABS
5.0000 mg | ORAL_TABLET | Freq: Two times a day (BID) | ORAL | 5 refills | Status: DC
Start: 2023-07-17 — End: 2024-01-20
  Filled 2023-07-17: qty 60, 30d supply, fill #0
  Filled 2023-08-20: qty 60, 30d supply, fill #1
  Filled 2023-09-21: qty 60, 30d supply, fill #2
  Filled 2023-10-24: qty 60, 30d supply, fill #3
  Filled 2023-11-26: qty 60, 30d supply, fill #4
  Filled 2023-12-26: qty 60, 30d supply, fill #5

## 2023-07-17 MED ORDER — CITALOPRAM HYDROBROMIDE 20 MG PO TABS
20.0000 mg | ORAL_TABLET | Freq: Every day | ORAL | 1 refills | Status: DC
Start: 2023-07-17 — End: 2023-12-20
  Filled 2023-07-17: qty 90, 90d supply, fill #0
  Filled 2023-10-05: qty 90, 90d supply, fill #1

## 2023-07-17 NOTE — Telephone Encounter (Signed)
Prescription refill request for Eliquis received. Indication:afib Last office visit:7/24 Scr:0.81  4/24 Age: 67 Weight:59.9  kg  Prescription refilled

## 2023-07-18 ENCOUNTER — Other Ambulatory Visit: Payer: Self-pay

## 2023-07-18 ENCOUNTER — Other Ambulatory Visit (HOSPITAL_COMMUNITY): Payer: Self-pay

## 2023-07-18 DIAGNOSIS — S39012A Strain of muscle, fascia and tendon of lower back, initial encounter: Secondary | ICD-10-CM

## 2023-07-19 ENCOUNTER — Ambulatory Visit (HOSPITAL_BASED_OUTPATIENT_CLINIC_OR_DEPARTMENT_OTHER)
Admission: RE | Admit: 2023-07-19 | Discharge: 2023-07-19 | Disposition: A | Discharge status: Home / Self Care | Payer: Medicare HMO | Source: Ambulatory Visit | Attending: Internal Medicine | Admitting: Internal Medicine

## 2023-07-19 ENCOUNTER — Other Ambulatory Visit: Payer: Medicare HMO

## 2023-07-19 DIAGNOSIS — Z1231 Encounter for screening mammogram for malignant neoplasm of breast: Secondary | ICD-10-CM | POA: Diagnosis not present

## 2023-07-20 ENCOUNTER — Encounter: Payer: Self-pay | Admitting: Family Medicine

## 2023-07-23 ENCOUNTER — Ambulatory Visit: Payer: Medicare HMO | Admitting: Family Medicine

## 2023-07-24 ENCOUNTER — Encounter (HOSPITAL_COMMUNITY): Payer: Self-pay | Admitting: Licensed Clinical Social Worker

## 2023-07-24 ENCOUNTER — Ambulatory Visit (INDEPENDENT_AMBULATORY_CARE_PROVIDER_SITE_OTHER): Payer: Medicare HMO | Admitting: Licensed Clinical Social Worker

## 2023-07-24 DIAGNOSIS — F4321 Adjustment disorder with depressed mood: Secondary | ICD-10-CM

## 2023-07-24 NOTE — Progress Notes (Signed)
   THERAPIST PROGRESS NOTE  Session Time: 12:30pm-1:30pm  Participation Level: Active  Behavioral Response: Well GroomedAlertDepressed and Irritable  Type of Therapy: Individual Therapy  Treatment Goals addressed: Evagelia will identify patterns in relationships that are related to her level of self esteem and make choices more in line with higher levels of self esteem and self-compassion      ProgressTowards Goals: Progressing  Interventions: Motivational Interviewing  Summary: DEDEE SELLIN is a 67 y.o. female who presents with Adjustment D/O with depressed mood.   Suicidal/Homicidal: Nowithout intent/plan  Therapist Response: Gennett engaged well in individual session with clinician. Clinician utilized MI OARS to reflect and summarize thoughts and feelings. Clinician explored updates in relationship and noted a couple of incidents over the past month that have been challenging between [place] and her partner. Clinician explored Lemya's responses to partner's temper/inability to commit. Clinician validated and reflected the mistrust that is building up. Clinician explored option that partner is a "yes man" and may say yes and not follow through as a way to appease her, even when he is not interested in doing the thing. Clinician noted that moving in together is now on hold. Clinician validated this decision and encouraged Bailea to focus on her own mental health and stability. Clinician provided resources for referrals for partner.   Plan: Return again in 2-3 weeks.  Diagnosis: Adjustment disorder with depressed mood  Collaboration of Care: Patient refused AEB none required  Patient/Guardian was advised Release of Information must be obtained prior to any record release in order to collaborate their care with an outside provider. Patient/Guardian was advised if they have not already done so to contact the registration department to sign all necessary forms in order for [place] to release  information regarding their care.   Consent: Patient/Guardian gives verbal consent for treatment and assignment of benefits for services provided during this visit. Patient/Guardian expressed understanding and agreed to proceed.   Chryl Heck [place], LCSW 07/24/2023

## 2023-07-25 ENCOUNTER — Encounter: Payer: Self-pay | Admitting: Family Medicine

## 2023-07-26 ENCOUNTER — Telehealth: Payer: Self-pay

## 2023-07-26 ENCOUNTER — Encounter: Payer: Self-pay | Admitting: Family Medicine

## 2023-07-26 NOTE — Telephone Encounter (Signed)
Pt has had x-ray, given an IM steroid injection, done PT, tried muscle relaxers and is still having pain. Would like to proceed with MRI at this time to evaluate for changes in the bulging disc seen on previous imaging or possible new disc issue. Per Dr. Pearletha Forge - pt will continue PT/HEP will we work on insurance authorization for MRI.

## 2023-07-30 ENCOUNTER — Other Ambulatory Visit: Payer: Medicare HMO

## 2023-08-14 ENCOUNTER — Ambulatory Visit (HOSPITAL_COMMUNITY): Payer: Medicare HMO | Admitting: Licensed Clinical Social Worker

## 2023-08-14 ENCOUNTER — Encounter (HOSPITAL_COMMUNITY): Payer: Self-pay | Admitting: Licensed Clinical Social Worker

## 2023-08-14 DIAGNOSIS — F4321 Adjustment disorder with depressed mood: Secondary | ICD-10-CM | POA: Diagnosis not present

## 2023-08-14 NOTE — Progress Notes (Signed)
   THERAPIST PROGRESS NOTE  Session Time: 12:30pm-1:30pm  Participation Level: Active  Behavioral Response: NeatAlertEuthymic  Type of Therapy: Individual Therapy  Treatment Goals addressed: Israh will identify patterns in relationships that are related to her level of self esteem and make choices more in line with higher levels of self esteem and self-compassion    ProgressTowards Goals: Progressing  Interventions: Motivational Interviewing  Summary: ZERELDA PTACEK is a 67 y.o. female who presents with Adjustment Disorder with depressed mood.   Suicidal/Homicidal: Nowithout intent/plan  Therapist Response: Anyce engaged well in individual in person session with clinician. Clinician utilized MI OARS to process and reflect thoughts and feelings. Jearline shared that her boyfriend has decided to put his house on the market and move into her house. Clinician explored how this feels for Lestine and what this will mean for her own adjustment. Clinician identified "sharing space" as an area for growth. Zameria shared she has not lived with another person since 1992, so this will be significant. Clinician also identified the importance on continuing to focus on communication, planning ahead for challenges in order to set them up for success. Tawny shared excitement and also some nervousness about this big change, but noted that it will likely be a bigger change for him, since he is moving, selling his house, and moving into her space.   Plan: Return again in 3 weeks.  Diagnosis: Adjustment disorder with depressed mood  Collaboration of Care: Patient refused AEB none required  Patient/Guardian was advised Release of Information must be obtained prior to any record release in order to collaborate their care with an outside provider. Patient/Guardian was advised if they have not already done so to contact the registration department to sign all necessary forms in order for [place] to release information  regarding their care.   Consent: Patient/Guardian gives verbal consent for treatment and assignment of benefits for services provided during this visit. Patient/Guardian expressed understanding and agreed to proceed.   Chryl Heck [place], LCSW 08/14/2023

## 2023-08-20 ENCOUNTER — Other Ambulatory Visit (HOSPITAL_COMMUNITY): Payer: Self-pay

## 2023-09-04 ENCOUNTER — Ambulatory Visit (INDEPENDENT_AMBULATORY_CARE_PROVIDER_SITE_OTHER): Payer: Medicare HMO | Admitting: Licensed Clinical Social Worker

## 2023-09-04 ENCOUNTER — Encounter (HOSPITAL_COMMUNITY): Payer: Self-pay | Admitting: Licensed Clinical Social Worker

## 2023-09-04 DIAGNOSIS — F4321 Adjustment disorder with depressed mood: Secondary | ICD-10-CM

## 2023-09-04 NOTE — Progress Notes (Signed)
   THERAPIST PROGRESS NOTE  Session Time: 12:30pm-1:25pm  Participation Level: Active  Behavioral Response: Well GroomedAlertEuthymic and Irritable  Type of Therapy: Individual Therapy  Treatment Goals addressed: Piya will identify patterns in relationships that are related to her level of self esteem and make choices more in line with higher levels of self esteem and self-compassion    ProgressTowards Goals: Progressing  Interventions: Motivational Interviewing  Summary: SHANDRELL TRAVERS is a 66 y.o. female who presents with Adjustment Disorder with depressed mood.   Suicidal/Homicidal: Nowithout intent/plan  Therapist Response: Aytana engaged well in individual and person session with clinician.  Clinician utilized motivational interviewing to process thoughts and feelings.  Clinician explored updates with partner and plan to move in together.  She also shared continuing frustration with partner's pace and stubbornness regarding the sale of his home.  Clinician explored locus of control with gel, noting that she is not able to be in control of this man and all she can do is support him and waits for him to be ready for the change.  Clinician reflected frustration and different pacing from her partner.  Clinician explored family holiday over the recent times and noted that there has been improvement in the relationship between [place] and her partner's child.  Chandler shared how special this felt to her as she has not been a part of a big family before.  Plan: Return again in 2 weeks.  Diagnosis: Adjustment disorder with depressed mood  Collaboration of Care: Patient refused AEB none required  Patient/Guardian was advised Release of Information must be obtained prior to any record release in order to collaborate their care with an outside provider. Patient/Guardian was advised if they have not already done so to contact the registration department to sign all necessary forms in order for [place] to  release information regarding their care.   Consent: Patient/Guardian gives verbal consent for treatment and assignment of benefits for services provided during this visit. Patient/Guardian expressed understanding and agreed to proceed.   Chryl Heck [place], LCSW 09/04/2023

## 2023-09-06 ENCOUNTER — Ambulatory Visit: Payer: Medicare HMO | Admitting: Family Medicine

## 2023-09-06 VITALS — BP 114/84 | Ht 65.0 in | Wt 132.0 lb

## 2023-09-06 DIAGNOSIS — M545 Low back pain, unspecified: Secondary | ICD-10-CM | POA: Diagnosis not present

## 2023-09-07 ENCOUNTER — Encounter: Payer: Self-pay | Admitting: Family Medicine

## 2023-09-07 NOTE — Progress Notes (Signed)
PCP: Philip Aspen, Limmie Patricia, MD  Subjective:   HPI: Patient is a 67 y.o. female here for low back pain.  10/9: Patient reports having worsening left low back pain since this morning. She went to get up and felt a sharp pain here. No radiation into legs. No nubmness/tingling. No bowel/bladder dysfunction. Took 2 ibuprofen.  12/5: Patient reports her low back pain continues. Not much change compared to last visit. Still staying in low back but has good days and bad days. No numbness/tingling. No bowel/bladder dysfunction. Also having lateral left knee pain without acute injury.  Past Medical History:  Diagnosis Date   Abdominal pain, left upper quadrant    Acute bronchospasm    Acute neck pain 07/02/2019   Acute sinusitis, unspecified 05/20/2009   Centricity Description: SINUSITIS - ACUTE-NOS Qualifier: Diagnosis of  By: Beverely Low MD, Natalia Leatherwood   Centricity Description: ACUTE SINUSITIS, UNSPECIFIED Qualifier: Diagnosis of  By: Thurmond Butts MD, Eugene   Centricity Description: SINUSITIS, ACUTE Qualifier: Diagnosis of  By: Orson Aloe MD, Tinnie Gens     Anxiety    Atypical chest pain 03/05/2013   Body mass index (BMI) of 23.0-23.9 in adult    Cervicalgia    Colon polyp    Costochondral junction syndrome    Cough    DEPRESSION 08/25/2008   Qualifier: Diagnosis of  By: Yetta Barre CMA, Chemira     Diarrhea    Diverticulosis 07/27/2012   Dorsalgia    Dysrhythmia    hx a-fib   Elevated blood pressure reading without diagnosis of hypertension    Endometrial polyp 05/2007   benign   Essential hypertension 09/20/2020   Family history of osteoporosis 12/28/2011   Mother pt had normal Dexa 11/2009    Fatigue    GERD (gastroesophageal reflux disease)    HIP PAIN, RIGHT 04/12/2009   Qualifier: Diagnosis of  By: Darrick Penna MD, KARL     History of hiatal hernia    HYPERLIPIDEMIA 08/25/2008   Qualifier: Diagnosis of  By: Beverely Low MD, Natalia Leatherwood     IBS (irritable bowel syndrome)    Left hip pain  07/30/2013   Left wrist pain 07/30/2013   Lumbar strain, initial encounter 07/28/2019   LUNG NODULE 11/19/2009   Neg CT     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 Please review for accuracy.   Otalgia, unspecified ear    Pain in throat    Palpitations    Paroxysmal atrial fibrillation (HCC)    Peroneal tendinitis of right lower extremity 03/12/2014   Plantar wart 04/02/2014   Pneumonia    RHINITIS 06/21/2009   Qualifier: Diagnosis of  By: Beverely Low MD, Natalia Leatherwood     Right ankle injury, subsequent encounter 01/11/2018   Right foot pain 08/09/2011   Sinusitis, chronic    Skene's gland abscess 2009   patient unaware   Sprain of unspecified ligament of right ankle, subsequent encounter    TRANSAMINASES, SERUM, ELEVATED 12/07/2009   Qualifier: Diagnosis of  By: Peggyann Juba FNP, Efraim Kaufmann  S    Unspecified cataract    Urinary tract infection, site not specified    Vitamin D deficiency     Current Outpatient Medications on File Prior to Visit  Medication Sig Dispense Refill   apixaban (ELIQUIS) 5 MG TABS tablet Take 1 tablet (5 mg total) by mouth 2 (two) times daily. 60 tablet 5   azelastine (ASTELIN) 0.1 % nasal spray Place 2 sprays into both nostrils 2 (two) times daily as directed 30 mL 12   Calcium Carbonate-Vitamin D (CALTRATE 600+D PO) Take 1 tablet by mouth daily.     citalopram (CELEXA) 20 MG tablet Take 1 tablet (20 mg total) by mouth daily. 90 tablet 1   fluticasone (FLONASE) 50 MCG/ACT nasal spray Place  1-2 sprays into both nostrils daily. (Patient taking differently: Place 1-2 sprays into both nostrils as needed for allergies or rhinitis.) 16 g 3   ipratropium (ATROVENT) 0.03 % nasal spray Place 2 sprays into both nostrils every 12 (twelve) hours. As needed. 30 mL 0   levocetirizine (XYZAL) 5 MG tablet Take 5 mg by mouth every evening.     Multiple Vitamins-Minerals (MULTI FOR HER 50+) TABS Take 1 tablet by mouth daily.     omeprazole (PRILOSEC) 20 MG capsule Take 1 capsule (20 mg total) by mouth in the morning 30 minutes before morning meal 90 capsule 1   Probiotic Product (PROBIOTIC PO) Take 1 capsule by mouth daily. Ultra Flora     tiZANidine (ZANAFLEX) 4 MG tablet Take 1 tablet (4 mg total) by mouth every 8 (eight) hours as needed. 60 tablet 1   vitamin C (ASCORBIC ACID) 500 MG tablet Take 500 mg by mouth daily.     No current facility-administered medications on file prior to visit.    Past Surgical History:  Procedure Laterality Date   ATRIAL FIBRILLATION ABLATION N/A 08/01/2022   Procedure: ATRIAL FIBRILLATION ABLATION;  Surgeon: Regan Lemming, MD;  Location: [facility];  Service: Cardiovascular;  Laterality: N/A;   BROW LIFT Bilateral 12/29/2021   Procedure: BLEPHAROPLASTY;  Surgeon: Janne Napoleon, MD;  Location: [facility] OR;  Service: Plastics;  Laterality: Bilateral;   COLONOSCOPY     x several   DILATION AND CURETTAGE OF UTERUS  05/2007   GYNECOLOGIC CRYOSURGERY  1980   HYSTEROSCOPY WITH D & C N/A 03/30/2017   Procedure: DILATATION & CURETTAGE/HYSTEROSCOPY WITH ULTRASOUND GUIDANCE;  Surgeon: Jerene Bears, MD;  Location: [facility];  Service: Gynecology;  Laterality: N/A;   UPPER GI ENDOSCOPY     uterine ablation  2007    Allergies  Allergen Reactions   Iohexol     Pt developed itching, redness, and hives after steroid injeciton (included lidocaine, kenalog, and Iohexol). Unsure which one caused the reaction. Pt will need 13 hr prep prior to any future Steroid  injections. AV, RN   Lidocaine     Pt developed itching, redness, and hives after steroid injeciton (included lidocaine, kenalog, and Iohexol). Unsure which one caused the reaction. Pt will need 13 hr prep prior to any future Steroid injections. AV, RN   Augmentin [Amoxicillin-Pot Clavulanate] Diarrhea    BP 114/84   Ht 5\' 5"  (1.651 m)   Wt 132 lb (59.9 kg)   LMP 10/02/2006 (Approximate)   BMI 21.97 kg/m      08/30/2020    9:03 AM 06/08/2021    9:34 AM 08/22/2021   10:20 AM 11/09/2021   10:19 AM  [facility] Adult Exercise  Frequency  of aerobic exercise (# of days/week) 7 7 7 6   Average time in minutes 60 60 60 60  Frequency of strengthening activities (# of days/week) 3 3 2 3         No data to display              Objective:  Physical Exam:  Gen: NAD, comfortable in exam room  Back: No gross deformity, scoliosis. Mild left paraspinal lumbar tenderness.  No midline/bony tenderness. FROM. Strength LEs 5/5 all muscle groups.    Left knee: No gross deformity, ecchymoses, swelling. Mild TTP distal IT band. FROM with normal strength. Negative ant/post drawers. Negative valgus/varus testing. Negative lachman. Negative mcmurrays, apleys. Positive noble.  Mild tightness with ober. NV intact distally.   Assessment & Plan:  1. Low back pain - not improving with continued home exercises, s/p physical therapy, depomedrol, tizanidine.  On Eliquis so no oral nsaids.  Will proceed with MRI to assess, consider epidural steroid injections or neurosurgery referral (she is able to come off the eliquis for short period).

## 2023-09-17 ENCOUNTER — Encounter: Payer: Self-pay | Admitting: Family Medicine

## 2023-09-17 NOTE — Addendum Note (Signed)
Addended by: Annita Brod on: 09/17/2023 01:08 PM   Modules accepted: Orders

## 2023-09-18 ENCOUNTER — Ambulatory Visit (INDEPENDENT_AMBULATORY_CARE_PROVIDER_SITE_OTHER): Payer: Medicare HMO | Admitting: Licensed Clinical Social Worker

## 2023-09-18 DIAGNOSIS — F4321 Adjustment disorder with depressed mood: Secondary | ICD-10-CM

## 2023-09-21 ENCOUNTER — Other Ambulatory Visit (HOSPITAL_COMMUNITY): Payer: Self-pay

## 2023-09-21 ENCOUNTER — Telehealth (HOSPITAL_BASED_OUTPATIENT_CLINIC_OR_DEPARTMENT_OTHER): Payer: Self-pay | Admitting: Family Medicine

## 2023-09-23 ENCOUNTER — Encounter (HOSPITAL_COMMUNITY): Payer: Self-pay | Admitting: Licensed Clinical Social Worker

## 2023-09-23 NOTE — Progress Notes (Signed)
   THERAPIST PROGRESS NOTE  Session Time: 12:30pm-1:25pm  Participation Level: Active  Behavioral Response: Well GroomedAlertDepressed  Type of Therapy: Individual Therapy  Treatment Goals addressed: Hanna will identify patterns in relationships that are related to her level of self esteem and make choices more in line with higher levels of self esteem and self-compassion    ProgressTowards Goals: Progressing  Interventions: Motivational Interviewing  Summary: MAJERLE DAUTERMAN is a 67 y.o. female who presents with Adjustment Disorder with depressed mood.   Suicidal/Homicidal: Nowithout intent/plan  Therapist Response: Navie engaged well in individual session with clinician.  Clinician utilized motivational interviewing OARRS to process thoughts and feelings.  Clinician explored relationship challenges with partner and a lot of stress over the past 2 weeks.  Kahley shared updates and frustration about relationship and partner's inability to commit.  Clinician reflected the pain and heart break in Derenda's voice.  Clinician discussed the grief process over losing a relationship as well as the loss of family and friends associated with partner.  Kyoko shared more pain over losing the daughter and granddaughter, as well as sibling relationships with partners' in-laws.  Clinician explored Coleen's willingness to return to the relationship if partner made more changes.  Naiesha shared that she no longer has the energy to deal with this anymore.  Plan: Return again in 2 weeks.  Diagnosis: Adjustment disorder with depressed mood  Collaboration of Care: Patient refused AEB none required  Patient/Guardian was advised Release of Information must be obtained prior to any record release in order to collaborate their care with an outside provider. Patient/Guardian was advised if they have not already done so to contact the registration department to sign all necessary forms in order for [place] to release information  regarding their care.   Consent: Patient/Guardian gives verbal consent for treatment and assignment of benefits for services provided during this visit. Patient/Guardian expressed understanding and agreed to proceed.   Chryl Heck [place], LCSW 09/23/2023

## 2023-09-27 ENCOUNTER — Ambulatory Visit (HOSPITAL_BASED_OUTPATIENT_CLINIC_OR_DEPARTMENT_OTHER): Payer: Medicare HMO

## 2023-10-02 ENCOUNTER — Ambulatory Visit (HOSPITAL_COMMUNITY): Payer: Medicare HMO | Admitting: Licensed Clinical Social Worker

## 2023-10-04 ENCOUNTER — Ambulatory Visit (HOSPITAL_COMMUNITY): Payer: Medicare HMO | Admitting: Licensed Clinical Social Worker

## 2023-10-04 DIAGNOSIS — F4321 Adjustment disorder with depressed mood: Secondary | ICD-10-CM

## 2023-10-05 ENCOUNTER — Encounter (HOSPITAL_COMMUNITY): Payer: Self-pay | Admitting: Licensed Clinical Social Worker

## 2023-10-05 NOTE — Progress Notes (Addendum)
 Virtual Visit via Video Note  I connected with Kara Warren on 10/04/23 at  2:30 PM EST by a video enabled telemedicine application and verified that I am speaking with the correct person using two identifiers.  Location: Patient: home Provider: home office   I discussed the limitations of evaluation and management by telemedicine and the availability of in person appointments. The patient expressed understanding and agreed to proceed.     I discussed the assessment and treatment plan with the patient. The patient was provided an opportunity to ask questions and all were answered. The patient agreed with the plan and demonstrated an understanding of the instructions.   The patient was advised to call back or seek an in-person evaluation if the symptoms worsen or if the condition fails to improve as anticipated.  I provided 45 minutes of non-face-to-face time during this encounter.   Kara JONELLE Rosser, LCSW   THERAPIST PROGRESS NOTE  Session Time: 2:30pm-3:15pm  Participation Level: Active  Behavioral Response: Well GroomedAlertAnxious and Euthymic  Type of Therapy: Individual Therapy  Treatment Goals addressed: Kara Warren will identify patterns in relationships that are related to her level of self esteem and make choices more in line with higher levels of self esteem and self-compassion    ProgressTowards Goals: Progressing  Interventions: CBT  Summary: Kara Warren is a 68 y.o. female who presents with Adjustment Disorder with depressed mood.   Suicidal/Homicidal: Nowithout intent/plan  Therapist Response: Kara Warren engaged well in individual virtual session with facilities manager.  Clinician utilized CBT to process thoughts feelings and interactions.  Clinician explored updates with partner through the holidays, status of their relationship, and plans for the future.  Clinician discussed Kara Warren's decision to return to the relationship following a sincere apology and improvement so  far.  Clinician processed Kara Warren's decision making and and explored ways to be supportive and to make the relationship more healthy.  Kara Warren shared interest in couples counseling clinician provided psychoeducation about options.  Clinician identified the importance of partner going into his own counseling relationship rather than just couples counseling.  Kara Warren shared joyful holiday experiences with partner's daughter.  Kara Warren shared the importance of family and her willingness to work on the relationship to maintain her role as Kara Warren to partner's granddaughter.  Plan: Return again in 1-2 weeks.  Diagnosis: Adjustment disorder with depressed mood  Collaboration of Care: Patient refused AEB none required  Patient/Guardian was advised Release of Information must be obtained prior to any record release in order to collaborate their care with an outside provider. Patient/Guardian was advised if they have not already done so to contact the registration department to sign all necessary forms in order for us  to release information regarding their care.   Consent: Patient/Guardian gives verbal consent for treatment and assignment of benefits for services provided during this visit. Patient/Guardian expressed understanding and agreed to proceed.   Kara JONELLE [place], LCSW 10/05/2023

## 2023-10-09 ENCOUNTER — Ambulatory Visit (HOSPITAL_COMMUNITY): Payer: Medicare HMO | Admitting: Licensed Clinical Social Worker

## 2023-10-10 ENCOUNTER — Other Ambulatory Visit: Payer: Medicare HMO

## 2023-10-11 ENCOUNTER — Encounter: Payer: Self-pay | Admitting: Internal Medicine

## 2023-10-12 ENCOUNTER — Ambulatory Visit (HOSPITAL_BASED_OUTPATIENT_CLINIC_OR_DEPARTMENT_OTHER)
Admission: RE | Admit: 2023-10-12 | Discharge: 2023-10-12 | Disposition: A | Discharge status: Home / Self Care | Payer: Medicare HMO | Source: Ambulatory Visit | Attending: Family Medicine | Admitting: Family Medicine

## 2023-10-12 DIAGNOSIS — M5136 Other intervertebral disc degeneration, lumbar region with discogenic back pain only: Secondary | ICD-10-CM | POA: Diagnosis not present

## 2023-10-12 DIAGNOSIS — M5116 Intervertebral disc disorders with radiculopathy, lumbar region: Secondary | ICD-10-CM | POA: Diagnosis not present

## 2023-10-12 DIAGNOSIS — X58XXXA Exposure to other specified factors, initial encounter: Secondary | ICD-10-CM | POA: Diagnosis not present

## 2023-10-12 DIAGNOSIS — M419 Scoliosis, unspecified: Secondary | ICD-10-CM | POA: Diagnosis not present

## 2023-10-12 DIAGNOSIS — M4726 Other spondylosis with radiculopathy, lumbar region: Secondary | ICD-10-CM | POA: Insufficient documentation

## 2023-10-12 DIAGNOSIS — M4186 Other forms of scoliosis, lumbar region: Secondary | ICD-10-CM | POA: Diagnosis not present

## 2023-10-12 DIAGNOSIS — M47816 Spondylosis without myelopathy or radiculopathy, lumbar region: Secondary | ICD-10-CM | POA: Diagnosis not present

## 2023-10-12 DIAGNOSIS — M47817 Spondylosis without myelopathy or radiculopathy, lumbosacral region: Secondary | ICD-10-CM | POA: Diagnosis not present

## 2023-10-12 DIAGNOSIS — S39012A Strain of muscle, fascia and tendon of lower back, initial encounter: Secondary | ICD-10-CM | POA: Insufficient documentation

## 2023-10-15 ENCOUNTER — Other Ambulatory Visit (HOSPITAL_COMMUNITY): Payer: Self-pay

## 2023-10-15 ENCOUNTER — Encounter: Payer: Self-pay | Admitting: Pulmonary Disease

## 2023-10-15 ENCOUNTER — Ambulatory Visit: Payer: Medicare HMO | Attending: Pulmonary Disease | Admitting: Pulmonary Disease

## 2023-10-15 VITALS — BP 122/84 | HR 69 | Ht 65.0 in | Wt 139.2 lb

## 2023-10-15 DIAGNOSIS — I480 Paroxysmal atrial fibrillation: Secondary | ICD-10-CM | POA: Diagnosis not present

## 2023-10-15 DIAGNOSIS — D6869 Other thrombophilia: Secondary | ICD-10-CM | POA: Diagnosis not present

## 2023-10-15 LAB — BASIC METABOLIC PANEL
BUN/Creatinine Ratio: 18 (ref 12–28)
BUN: 14 mg/dL (ref 8–27)
CO2: 29 mmol/L (ref 20–29)
Calcium: 10.2 mg/dL (ref 8.7–10.3)
Chloride: 97 mmol/L (ref 96–106)
Creatinine, Ser: 0.76 mg/dL (ref 0.57–1.00)
Glucose: 100 mg/dL — ABNORMAL HIGH (ref 70–99)
Potassium: 4.9 mmol/L (ref 3.5–5.2)
Sodium: 135 mmol/L (ref 134–144)
eGFR: 85 mL/min/{1.73_m2} (ref >59)

## 2023-10-15 LAB — CBC
Hematocrit: 41.5 % (ref 34.0–46.6)
Hemoglobin: 14.2 g/dL (ref 11.1–15.9)
MCH: 33 pg (ref 26.6–33.0)
MCHC: 34.2 g/dL (ref 31.5–35.7)
MCV: 97 fL (ref 79–97)
Platelets: 319 10*3/uL (ref 150–450)
RBC: 4.3 x10E6/uL (ref 3.77–5.28)
RDW: 12.3 % (ref 11.7–15.4)
WBC: 5.2 10*3/uL (ref 3.4–10.8)

## 2023-10-15 MED ORDER — DILTIAZEM HCL 30 MG PO TABS
30.0000 mg | ORAL_TABLET | Freq: Two times a day (BID) | ORAL | 3 refills | Status: AC | PRN
  Filled 2023-10-15: qty 180, 90d supply, fill #0

## 2023-10-15 NOTE — Progress Notes (Signed)
  Electrophysiology Office Note:   Date:  10/15/2023  ID:  Kara Warren, DOB 01-30-1956, MRN [id number]  Primary Cardiologist: Lonni Cash, MD Primary Heart Failure: None Electrophysiologist: Will Gladis Norton, MD      History of Present Illness:   Kara Warren is a 68 y.o. female with h/o PAF s/p ablation 08/01/2022, HTN, & HLD seen today for routine electrophysiology followup.   Since last being seen in our clinic the patient reports doing very well. She has not had any known AF.  She asks about what to do if she has recurrent AF.   She denies chest pain, palpitations, dyspnea, PND, orthopnea, nausea, vomiting, dizziness, syncope, edema, weight gain, or early satiety.   Review of systems complete and found to be negative unless listed in HPI.   EP Information / Studies Reviewed:    EKG is ordered today. Personal review as below.  EKG Interpretation Date/Time:  Monday October 15 2023 09:11:05 EST Ventricular Rate:  61 PR Interval:  132 QRS Duration:  98 QT Interval:  426 QTC Calculation: 428 R Axis:   3  Text Interpretation: Normal sinus rhythm Nonspecific ST abnormality Confirmed by Aniceto Jarvis ([id number]) on 10/15/2023 9:15:56 AM   Studies:  ECHO 04/2022 > LVEF 60-65%, no RWMA, LA moderately to severely dilated, RA mildly dilated   Arrhythmia / AAD AF s/p Ablation 08/01/2022 Flecainide  started 03/2022 -> stopped 04/2023 (had only been taking once daily)    Risk Assessment/Calculations:    CHA2DS2-VASc Score = 3   This indicates a 3.2% annual risk of stroke. The patient's score is based upon: CHF History: 0 HTN History: 1 Diabetes History: 0 Stroke History: 0 Vascular Disease History: 0 Age Score: 1 Gender Score: 1             Physical Exam:   VS:  BP 122/84   Pulse 69   Ht 5' 5 (1.651 m)   Wt 139 lb 3.2 oz (63.1 kg)   LMP 10/02/2006 (Approximate)   SpO2 95%   BMI 23.16 kg/m    Wt Readings from Last 3 Encounters:  10/15/23 139 lb 3.2  oz (63.1 kg)  09/06/23 132 lb (59.9 kg)  07/11/23 132 lb (59.9 kg)     GEN: Well nourished, well developed in no acute distress NECK: No JVD; No carotid bruits CARDIAC: Regular rate and rhythm, no murmurs, rubs, gallops RESPIRATORY:  Clear to auscultation without rales, wheezing or rhonchi  ABDOMEN: Soft, non-tender, non-distended EXTREMITIES:  No edema; No deformity   ASSESSMENT AND PLAN:    Paroxysmal Atrial Fibrillation  CHA2DS2-VASc 3. S/p ablation 08/02/23 -continue OAC for stroke prophylaxis given scoring above / guidelines do not support stopping -no symptom burden currently  -pt monitors with an Apple Watch, no AF burden -EKG with NSR -labs today > BMP, CBC -refilled PRN cardizem  at pt's request to have in the event she had recurrent burden.  Discussed calling if new symptoms   Secondary Hypercoagulable State  -continue eliquis  5mg  BID, dose reviewed and appropriate by age/wt   Follow up with Dr. Norton in 6 months  Signed, Jarvis Aniceto, MSN, APRN, NP-C, AGACNP-BC [facility] - Electrophysiology  10/15/2023, 12:34 PM

## 2023-10-15 NOTE — Patient Instructions (Signed)
 Medication Instructions:  Start diltiazem  (Cardizem ) 30 mg twice daily as needed for afib symptoms *If you need a refill on your cardiac medications before your next appointment, please call your pharmacy*  Lab Work: BMP, CBC-TODAY If you have labs (blood work) drawn today and your tests are completely normal, you will receive your results only by: MyChart Message (if you have MyChart) OR A paper copy in the mail If you have any lab test that is abnormal or we need to change your treatment, we will call you to review the results.  Follow-Up: At [facility], you and your health needs are our priority.  As part of our continuing mission to provide you with exceptional heart care, we have created designated Provider Care Teams.  These Care Teams include your primary Cardiologist (physician) and Advanced Practice Providers (APPs -  Physician Assistants and Nurse Practitioners) who all work together to provide you with the care you need, when you need it.  Your next appointment:   6 month(s)  Provider:   Daphne Barrack, NP

## 2023-10-16 ENCOUNTER — Ambulatory Visit: Payer: Medicare HMO | Admitting: Cardiology

## 2023-10-16 ENCOUNTER — Other Ambulatory Visit (HOSPITAL_COMMUNITY): Payer: Self-pay

## 2023-10-16 ENCOUNTER — Telehealth (INDEPENDENT_AMBULATORY_CARE_PROVIDER_SITE_OTHER): Payer: Medicare HMO | Admitting: Family Medicine

## 2023-10-16 DIAGNOSIS — J019 Acute sinusitis, unspecified: Secondary | ICD-10-CM

## 2023-10-16 MED ORDER — AZITHROMYCIN 250 MG PO TABS
ORAL_TABLET | ORAL | 0 refills | Status: AC
  Filled 2023-10-16: qty 6, 5d supply, fill #0

## 2023-10-16 NOTE — Progress Notes (Signed)
 Subjective:    Patient ID: Kara Warren, female    DOB: 06-05-1956, 68 y.o.   MRN: [id number]  HPI Virtual Visit via Video Note  I connected with the patient on 10/16/23 at  2:30 PM EST by a video enabled telemedicine application and verified that I am speaking with the correct person using two identifiers.  Location patient: home Location provider:work or home office Persons participating in the virtual visit: patient, provider  I discussed the limitations of evaluation and management by telemedicine and the availability of in person appointments. The patient expressed understanding and agreed to proceed.   HPI: Here for 8 days of sinus congestion, PND, and facial pressure. No fever or ST or cough. Taking Mucinex  and using saline nasal sprays.    ROS: See pertinent positives and negatives per HPI.  Past Medical History:  Diagnosis Date   Abdominal pain, left upper quadrant    Acute bronchospasm    Acute neck pain 07/02/2019   Acute sinusitis, unspecified 05/20/2009   Centricity Description: SINUSITIS - ACUTE-NOS Qualifier: Diagnosis of  By: Mahlon MD, Comer   Centricity Description: ACUTE SINUSITIS, UNSPECIFIED Qualifier: Diagnosis of  By: Desiderio MD, Eugene   Centricity Description: SINUSITIS, ACUTE Qualifier: Diagnosis of  By: Charlott MD, Reyes     Anxiety    Atypical chest pain 03/05/2013   Body mass index (BMI) of 23.0-23.9 in adult    Cervicalgia    Colon polyp    Costochondral junction syndrome    Cough    DEPRESSION 08/25/2008   Qualifier: Diagnosis of  By: Joshua CMA, Chemira     Diarrhea    Diverticulosis 07/27/2012   Dorsalgia    Dysrhythmia    hx a-fib   Elevated blood pressure reading without diagnosis of hypertension    Endometrial polyp 05/2007   benign   Essential hypertension 09/20/2020   Family history of osteoporosis 12/28/2011   Mother pt had normal Dexa 11/2009    Fatigue    GERD (gastroesophageal reflux disease)    HIP PAIN, RIGHT  04/12/2009   Qualifier: Diagnosis of  By: HARVEY MD, KARL     History of hiatal hernia    HYPERLIPIDEMIA 08/25/2008   Qualifier: Diagnosis of  By: Mahlon MD, Comer     IBS (irritable bowel syndrome)    Left hip pain 07/30/2013   Left wrist pain 07/30/2013   Lumbar strain, initial encounter 07/28/2019   LUNG NODULE 11/19/2009   Neg CT     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 Please review for accuracy.   Otalgia, unspecified ear    Pain in throat    Palpitations    Paroxysmal atrial fibrillation (HCC)    Peroneal tendinitis of right lower extremity 03/12/2014   Plantar wart 04/02/2014   Pneumonia    RHINITIS 06/21/2009   Qualifier: Diagnosis of  By: Mahlon MD, Katherine     Right ankle injury, subsequent encounter 01/11/2018   Right foot pain 08/09/2011  Sinusitis, chronic    Skene's gland abscess 2009   patient unaware   Sprain of unspecified ligament of right ankle, subsequent encounter    TRANSAMINASES, SERUM, ELEVATED 12/07/2009   Qualifier: Diagnosis of  By: Daryl FNP, Melissa S    Unspecified cataract    Urinary tract infection, site not specified    Vitamin D  deficiency     Past Surgical History:  Procedure Laterality Date   ATRIAL FIBRILLATION ABLATION N/A 08/01/2022   Procedure: ATRIAL FIBRILLATION ABLATION;  Surgeon: Inocencio Soyla Lunger, MD;  [facility]: [facility];  Service: Cardiovascular;  Laterality: N/A;   BROW LIFT Bilateral 12/29/2021   Procedure:  BLEPHAROPLASTY;  Surgeon: Marene Sieving, MD;  Location: [facility] OR;  Service: Plastics;  Laterality: Bilateral;   COLONOSCOPY     x several   DILATION AND CURETTAGE OF UTERUS  05/2007   GYNECOLOGIC CRYOSURGERY  1980   HYSTEROSCOPY WITH D & C N/A 03/30/2017   Procedure: DILATATION & CURETTAGE/HYSTEROSCOPY WITH ULTRASOUND GUIDANCE;  Surgeon: Cleotilde Ronal RAMAN, MD;  Location: [facility];  Service: Gynecology;  Laterality: N/A;   UPPER GI ENDOSCOPY     uterine ablation  2007    Family History  Problem Relation Age of Onset   Colon polyps Father    Ulcerative colitis Father    CAD Father        Stent placement at age 77   Glaucoma Father    Hypertension Father    Colitis Father    Breast cancer Paternal Grandmother    Colon cancer Maternal Grandfather    Colon polyps Mother    Osteoporosis Mother    Thyroid  disease Mother        hypo   Glaucoma Mother    Diverticulosis Mother    Sudden death Neg Hx    Hyperlipidemia Neg Hx    Heart attack Neg Hx    Diabetes Neg Hx      Current Outpatient Medications:    apixaban  (ELIQUIS ) 5 MG TABS tablet, Take 1 tablet (5 mg total) by mouth 2 (two) times daily., Disp: 60 tablet, Rfl: 5   azelastine  (ASTELIN ) 0.1 % nasal spray, Place 2 sprays into both nostrils 2 (two) times daily as directed, Disp: 30 mL, Rfl: 12   azithromycin  (ZITHROMAX  Z-PAK) 250 MG tablet, As directed, Disp: 6 each, Rfl: 0   Calcium Carbonate-Vitamin D  (CALTRATE 600+D PO), Take 1 tablet by mouth daily., Disp: , Rfl:    citalopram  (CELEXA ) 20 MG tablet, Take 1 tablet (20 mg total) by mouth daily., Disp: 90 tablet, Rfl: 1   diltiazem  (CARDIZEM ) 30 MG tablet, Take 1 tablet (30 mg total) by mouth 2 (two) times daily as needed (for afib symptoms)., Disp: 180 tablet, Rfl: 3   fluticasone  (FLONASE ) 50 MCG/ACT nasal spray, Place 1-2 sprays into both nostrils daily. (Patient taking differently: Place 1-2 sprays into both nostrils as needed for allergies or rhinitis.), Disp: 16 g, Rfl: 3    ipratropium (ATROVENT ) 0.03 % nasal spray, Place 2 sprays into both nostrils every 12 (twelve) hours. As needed., Disp: 30 mL, Rfl: 0   levocetirizine (XYZAL) 5 MG tablet, Take 5 mg by mouth every evening., Disp: , Rfl:    Multiple Vitamins-Minerals (MULTI FOR HER 50+) TABS, Take 1 tablet by mouth daily., Disp: , Rfl:    Probiotic Product (PROBIOTIC PO), Take 1 capsule by mouth daily. Ultra Flora, Disp: , Rfl:    vitamin C (ASCORBIC ACID) 500 MG tablet, Take 500 mg by mouth  daily., Disp: , Rfl:   EXAM:  VITALS per patient if applicable:  GENERAL: alert, oriented, appears well and in no acute distress  HEENT: atraumatic, conjunttiva clear, no obvious abnormalities on inspection of external nose and ears  NECK: normal movements of the head and neck  LUNGS: on inspection no signs of respiratory distress, breathing rate appears normal, no obvious gross SOB, gasping or wheezing  CV: no obvious cyanosis  MS: moves all visible extremities without noticeable abnormality  PSYCH/NEURO: pleasant and cooperative, no obvious depression or anxiety, speech and thought processing grossly intact  ASSESSMENT AND PLAN: Sinusitis, treat with a Zpack. Kara Olmsted, MD  Discussed the following assessment and plan:  No diagnosis found.     I discussed the assessment and treatment plan with the patient. The patient was provided an opportunity to ask questions and all were answered. The patient agreed with the plan and demonstrated an understanding of the instructions.   The patient was advised to call back or seek an in-person evaluation if the symptoms worsen or if the condition fails to improve as anticipated.      Review of Systems     Objective:   Physical Exam        Assessment & Plan:

## 2023-10-17 ENCOUNTER — Other Ambulatory Visit (HOSPITAL_COMMUNITY): Payer: Self-pay

## 2023-10-22 ENCOUNTER — Encounter: Payer: Self-pay | Admitting: Family Medicine

## 2023-10-23 ENCOUNTER — Telehealth: Payer: Medicare HMO | Admitting: Internal Medicine

## 2023-10-23 ENCOUNTER — Ambulatory Visit (INDEPENDENT_AMBULATORY_CARE_PROVIDER_SITE_OTHER): Payer: Medicare HMO | Admitting: Licensed Clinical Social Worker

## 2023-10-23 ENCOUNTER — Other Ambulatory Visit: Payer: Self-pay

## 2023-10-23 ENCOUNTER — Encounter: Payer: Self-pay | Admitting: Family Medicine

## 2023-10-23 DIAGNOSIS — F4321 Adjustment disorder with depressed mood: Secondary | ICD-10-CM | POA: Diagnosis not present

## 2023-10-23 DIAGNOSIS — M5416 Radiculopathy, lumbar region: Secondary | ICD-10-CM

## 2023-10-23 NOTE — Progress Notes (Signed)
Called and left voicemail - will send mychart message

## 2023-10-24 ENCOUNTER — Other Ambulatory Visit: Payer: Self-pay | Admitting: Family Medicine

## 2023-10-24 ENCOUNTER — Other Ambulatory Visit (HOSPITAL_COMMUNITY): Payer: Self-pay

## 2023-10-24 DIAGNOSIS — M5416 Radiculopathy, lumbar region: Secondary | ICD-10-CM

## 2023-10-29 ENCOUNTER — Telehealth: Payer: Self-pay | Admitting: *Deleted

## 2023-10-29 ENCOUNTER — Encounter (HOSPITAL_COMMUNITY): Payer: Self-pay | Admitting: Licensed Clinical Social Worker

## 2023-10-29 NOTE — Telephone Encounter (Signed)
   Pre-operative Risk Assessment    Patient Name: Kara Warren  DOB: 17-May-1956 MRN: [id number]   Date of last office visit: 10/15/2023 Date of next office visit: None   Request for Surgical Clearance    Procedure:   Lumbar Epi  Date of Surgery:  Clearance TBD                                 Surgeon:  [facility] Surgeon's Group or Practice Name:  [facility] Phone number:  [phone] Fax number:  [phone]   Type of Clearance Requested:   - Medical  - Pharmacy:  Hold Apixaban (Eliquis) 2 days prior   Type of Anesthesia:  Not Indicated   Additional requests/questions:    Signed, Emmit Pomfret   10/29/2023, 11:29 AM

## 2023-10-29 NOTE — Progress Notes (Signed)
   THERAPIST PROGRESS NOTE  Session Time: 12:30pm-1:25pm  Participation Level: Active  Behavioral Response: NeatAlertEuthymic  Type of Therapy: Individual Therapy  Treatment Goals addressed: Everly will identify patterns in relationships that are related to her level of self esteem and make choices more in line with higher levels of self esteem and self-compassion    ProgressTowards Goals: Progressing  Interventions: CBT  Summary: [name] is a 68 y.o. female who presents with Adjustment Disorder, with depressed mood.   Suicidal/Homicidal: Nowithout intent/plan  Therapist Response: Doryce engaged well in individual and person session with clinician.  Clinician utilized CBT to process thoughts feelings and interactions.  Clinician explored boundary setting and decision making in her relationship.  Ethleen shared improvement in communication with her partner and increased ability to take a step back and allow partner to make his own decisions at his pace.  Clinician identified improvement in Anwen's overall attitude and noted reduction in taking things personally when partner moves at a slower pace.  Skya shared that she is feeling better, having her own space and not being stressed about what her partner will do regarding cohabitating, selling his house, and interacting with his family.  Plan: Return again in 2 weeks.  Diagnosis: Adjustment disorder with depressed mood  Collaboration of Care: Patient refused AEB none required  Patient/Guardian was advised Release of Information must be obtained prior to any record release in order to collaborate their care with an outside provider. Patient/Guardian was advised if they have not already done so to contact the registration department to sign all necessary forms in order for [place] to release information regarding their care.   Consent: Patient/Guardian gives verbal consent for treatment and assignment of benefits for services provided during  this visit. Patient/Guardian expressed understanding and agreed to proceed.   Chryl Heck [place], LCSW 10/29/2023

## 2023-10-30 ENCOUNTER — Other Ambulatory Visit (HOSPITAL_COMMUNITY): Payer: Self-pay

## 2023-10-30 ENCOUNTER — Ambulatory Visit
Admission: RE | Admit: 2023-10-30 | Discharge: 2023-10-30 | Disposition: A | Discharge status: Home / Self Care | Payer: Medicare HMO | Source: Ambulatory Visit | Attending: Family Medicine | Admitting: Family Medicine

## 2023-10-30 VITALS — BP 131/85 | HR 57 | Temp 98.2°F | Resp 16

## 2023-10-30 DIAGNOSIS — R21 Rash and other nonspecific skin eruption: Secondary | ICD-10-CM | POA: Diagnosis not present

## 2023-10-30 DIAGNOSIS — J329 Chronic sinusitis, unspecified: Secondary | ICD-10-CM | POA: Diagnosis not present

## 2023-10-30 MED ORDER — CEFDINIR 300 MG PO CAPS
300.0000 mg | ORAL_CAPSULE | Freq: Two times a day (BID) | ORAL | 0 refills | Status: DC
  Filled 2023-10-30: qty 20, 10d supply, fill #0

## 2023-10-30 MED ORDER — HYDROXYZINE HCL 25 MG PO TABS
12.5000 mg | ORAL_TABLET | Freq: Three times a day (TID) | ORAL | 0 refills | Status: DC | PRN
  Filled 2023-10-30: qty 30, 10d supply, fill #0

## 2023-10-30 MED ORDER — PREDNISONE 20 MG PO TABS
ORAL_TABLET | ORAL | 0 refills | Status: DC
  Filled 2023-10-30: qty 10, 5d supply, fill #0

## 2023-10-30 NOTE — Discharge Instructions (Signed)
Will use prednisone for the chronic sinus infection and flare up. Will also use cefdinir for this. Use hydroxyzine as needed for itching. If you feel any heart racing, palpitations then go ahead and stop the prednisone and rely on hydroxyzine for the rash. Avoid any new exposures.

## 2023-10-30 NOTE — Telephone Encounter (Signed)
Will route to PharmD for rec's re: holding anticoagulation. Tereso Newcomer, PA-C    10/30/2023 12:53 PM

## 2023-10-30 NOTE — ED Triage Notes (Signed)
Pt reports mild itching rash on on back, torso and legs x 3 days. Benadryl cream gives some relief. Denies new meds, food, detergents, soaps, lotions.   Pt reports she still feels having nasal congestion x 3 weeks. Pt finished Zpak 2 weeks ago.

## 2023-10-30 NOTE — ED Provider Notes (Signed)
[facility]  Note:  This document was prepared using Conservation officer, historic buildings and may include unintentional dictation errors.  MRN: [id number] DOB: Aug 16, 1956  Subjective:   Kara Warren is a 68 y.o. female presenting for 3-week history of persistent sinus congestion, sinus drainage, sinus irritation.  Has a history of chronic sinus infections, chronic sinusitis.  She underwent a course of Z-Pak 2 weeks ago and finished it about 10 days ago.  Has previously taken azithromycin without any issues.  However, in the past 3 days she has developed spots over her torso, thighs.  No spots on the hands, face, swelling, difficulty with her breathing.  Denies eating any new foods, starting new medications, exposure to poisonous plants, new hygiene products, new cleaning products or detergents.   No current facility-administered medications for this encounter.  Current Outpatient Medications:    diphenhydrAMINE-zinc acetate (BENADRYL) cream, Apply 1 Application topically 3 (three) times daily as needed for itching., Disp: , Rfl:    apixaban (ELIQUIS) 5 MG TABS tablet, Take 1 tablet (5 mg total) by mouth 2 (two) times daily., Disp: 60 tablet, Rfl: 5   azelastine (ASTELIN) 0.1 % nasal spray, Place 2 sprays into both nostrils 2 (two) times daily as directed, Disp: 30 mL, Rfl: 12   Calcium Carbonate-Vitamin D (CALTRATE 600+D PO), Take 1 tablet by mouth daily., Disp: , Rfl:    citalopram (CELEXA) 20 MG tablet, Take 1 tablet (20 mg total) by mouth daily., Disp: 90 tablet, Rfl: 1   diltiazem (CARDIZEM) 30 MG tablet, Take 1 tablet (30 mg total) by mouth 2 (two) times daily as needed (for afib symptoms)., Disp: 180 tablet, Rfl: 3   fluticasone (FLONASE) 50 MCG/ACT nasal spray, Place 1-2 sprays into both nostrils daily. (Patient taking differently: Place 1-2 sprays into both nostrils as needed for allergies or rhinitis.), Disp: 16 g, Rfl: 3   ipratropium (ATROVENT) 0.03 % nasal  spray, Place 2 sprays into both nostrils every 12 (twelve) hours. As needed., Disp: 30 mL, Rfl: 0   levocetirizine (XYZAL) 5 MG tablet, Take 5 mg by mouth every evening., Disp: , Rfl:    Multiple Vitamins-Minerals (MULTI FOR HER 50+) TABS, Take 1 tablet by mouth daily., Disp: , Rfl:    Probiotic Product (PROBIOTIC PO), Take 1 capsule by mouth daily. Ultra Flora, Disp: , Rfl:    vitamin C (ASCORBIC ACID) 500 MG tablet, Take 500 mg by mouth daily., Disp: , Rfl:    Allergies  Allergen Reactions   Iohexol     Pt developed itching, redness, and hives after steroid injeciton (included lidocaine, kenalog, and Iohexol). Unsure which one caused the reaction. Pt will need 13 hr prep prior to any future Steroid injections. AV, RN   Lidocaine     Pt developed itching, redness, and hives after steroid injeciton (included lidocaine, kenalog, and Iohexol). Unsure which one caused the reaction. Pt will need 13 hr prep prior to any future Steroid injections. AV, RN   Augmentin [Amoxicillin-Pot Clavulanate] Diarrhea    Past Medical History:  Diagnosis Date   Abdominal pain, left upper quadrant    Acute bronchospasm    Acute neck pain 07/02/2019   Acute sinusitis, unspecified 05/20/2009   Centricity Description: SINUSITIS - ACUTE-NOS Qualifier: Diagnosis of  By: Beverely Low MD, Natalia Leatherwood   Centricity Description: ACUTE SINUSITIS, UNSPECIFIED Qualifier: Diagnosis of  By: Thurmond Butts MD, Dennard Nip   Centricity Description: SINUSITIS, ACUTE Qualifier: Diagnosis of  By: Orson Aloe MD, Tinnie Gens  Anxiety    Atypical chest pain 03/05/2013   Body mass index (BMI) of 23.0-23.9 in adult    Cervicalgia    Colon polyp    Costochondral junction syndrome    Cough    DEPRESSION 08/25/2008   Qualifier: Diagnosis of  By: Yetta Barre CMA, Chemira     Diarrhea    Diverticulosis 07/27/2012   Dorsalgia    Dysrhythmia    hx a-fib   Elevated blood pressure reading without diagnosis of hypertension    Endometrial polyp 05/2007   benign    Essential hypertension 09/20/2020   Family history of osteoporosis 12/28/2011   Mother pt had normal Dexa 11/2009    Fatigue    GERD (gastroesophageal reflux disease)    HIP PAIN, RIGHT 04/12/2009   Qualifier: Diagnosis of  By: Darrick Penna MD, KARL     History of hiatal hernia    HYPERLIPIDEMIA 08/25/2008   Qualifier: Diagnosis of  By: Beverely Low MD, Natalia Leatherwood     IBS (irritable bowel syndrome)    Left hip pain 07/30/2013   Left wrist pain 07/30/2013   Lumbar strain, initial encounter 07/28/2019   LUNG NODULE 11/19/2009   Neg CT     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 Please review for accuracy.   Otalgia, unspecified ear    Pain in throat    Palpitations    Paroxysmal atrial fibrillation (HCC)    Peroneal tendinitis of right lower extremity 03/12/2014   Plantar wart 04/02/2014   Pneumonia    RHINITIS 06/21/2009   Qualifier: Diagnosis of  By: Beverely Low MD, Natalia Leatherwood     Right ankle injury, subsequent encounter 01/11/2018   Right foot pain 08/09/2011   Sinusitis, chronic    Skene's gland abscess 2009   patient unaware   Sprain of unspecified ligament of right ankle, subsequent encounter    TRANSAMINASES, SERUM, ELEVATED 12/07/2009   Qualifier: Diagnosis of  By: Peggyann Juba FNP, Melissa S    Unspecified cataract    Urinary tract infection, site not specified    Vitamin D deficiency      Past Surgical History:  Procedure Laterality Date   ATRIAL FIBRILLATION ABLATION N/A 08/01/2022   Procedure: ATRIAL  FIBRILLATION ABLATION;  Surgeon: Regan Lemming, MD;  Location: [facility];  Service: Cardiovascular;  Laterality: N/A;   BROW LIFT Bilateral 12/29/2021   Procedure: BLEPHAROPLASTY;  Surgeon: Janne Napoleon, MD;  Location: [facility];  Service: Plastics;  Laterality: Bilateral;   COLONOSCOPY     x several   DILATION AND CURETTAGE OF UTERUS  05/2007   GYNECOLOGIC CRYOSURGERY  1980   HYSTEROSCOPY WITH D & C N/A 03/30/2017   Procedure: DILATATION & CURETTAGE/HYSTEROSCOPY WITH ULTRASOUND GUIDANCE;  Surgeon: Jerene Bears, MD;  Location: [facility];  Service: Gynecology;  Laterality: N/A;   UPPER GI ENDOSCOPY     uterine ablation  2007    Family History  Problem Relation Age of Onset   Colon polyps Father  Ulcerative colitis Father    CAD Father        Stent placement at age 51   Glaucoma Father    Hypertension Father    Colitis Father    Breast cancer Paternal Grandmother    Colon cancer Maternal Grandfather    Colon polyps Mother    Osteoporosis Mother    Thyroid disease Mother        hypo   Glaucoma Mother    Diverticulosis Mother    Sudden death Neg Hx    Hyperlipidemia Neg Hx    Heart attack Neg Hx    Diabetes Neg Hx     Social History   Tobacco Use   Smoking status: Former    Current packs/day: 0.00    Average packs/day: 0.1 packs/day for 3.0 years (0.3 ttl pk-yrs)    Types: Cigarettes    Start date: 10/03/1975    Quit date: 10/02/1978    Years since quitting: 45.1   Smokeless tobacco: Never   Tobacco comments:    Former smoker 08/29/22  Vaping Use   Vaping status: Never Used  Substance Use Topics   Alcohol use: Yes    Alcohol/week: 7.0 standard drinks of alcohol    Types: 7 Glasses of wine per week    Comment: per week   Drug use: No    ROS   Objective:   Vitals: BP 131/85 (BP Location: Left Arm)   Pulse (!) 57   Temp 98.2 F (36.8 C) (Oral)   Resp 16   LMP 10/02/2006 (Approximate)   SpO2 97%   Physical Exam Constitutional:       General: She is not in acute distress.    Appearance: Normal appearance. She is well-developed and normal weight. She is not ill-appearing, toxic-appearing or diaphoretic.  HENT:     Head: Normocephalic and atraumatic.     Right Ear: Tympanic membrane, ear canal and external ear normal. No drainage or tenderness. No middle ear effusion. There is no impacted cerumen. Tympanic membrane is not erythematous or bulging.     Left Ear: Tympanic membrane, ear canal and external ear normal. No drainage or tenderness.  No middle ear effusion. There is no impacted cerumen. Tympanic membrane is not erythematous or bulging.     Nose: Congestion present. No rhinorrhea.     Mouth/Throat:     Mouth: Mucous membranes are moist. No oral lesions.     Pharynx: No pharyngeal swelling, oropharyngeal exudate, posterior oropharyngeal erythema or uvula swelling.     Tonsils: No tonsillar exudate or tonsillar abscesses.     Comments: Post-nasal drainage overlying pharynx.  Eyes:     General: No scleral icterus.       Right eye: No discharge.        Left eye: No discharge.     Extraocular Movements: Extraocular movements intact.     Right eye: Normal extraocular motion.     Left eye: Normal extraocular motion.     Conjunctiva/sclera: Conjunctivae normal.  Cardiovascular:     Rate and Rhythm: Normal rate and regular rhythm.     Heart sounds: Normal heart sounds. No murmur heard.    No friction rub. No gallop.  Pulmonary:     Effort: Pulmonary effort is normal. No respiratory distress.     Breath sounds: No stridor. No wheezing, rhonchi or rales.  Chest:     Chest wall: No tenderness.  Musculoskeletal:     Cervical back: Normal range of motion and neck supple.  Lymphadenopathy:     Cervical: No cervical adenopathy.  Skin:    General: Skin is warm and dry.     Findings: Rash (scattered pruritic macular lesions over the torso, thighs sparing the face, hands) present.  Neurological:     General: No focal  deficit present.     Mental Status: She is alert and oriented to person, place, and time.  Psychiatric:        Mood and Affect: Mood normal.        Behavior: Behavior normal.     Assessment and Plan :   PDMP not reviewed this encounter.  1. Chronic sinusitis, unspecified location   2. Rash and nonspecific skin eruption    Recommended cefdinir and prednisone for acute on chronic sinusitis.  I suspect that this will also help a reactive, inflammatory rash.  Discussed the possibility of an allergic reaction but as she has taken azithromycin in the past we will hold off on adding it to her medication list of allergies.  Monitor for atrial fibrillation as prednisone can provoke this.  Counseled patient on potential for adverse effects with medications prescribed/recommended today, ER and return-to-clinic precautions discussed, patient verbalized understanding.    Wallis Bamberg, [place] 10/30/23 1610

## 2023-11-01 NOTE — Telephone Encounter (Signed)
Patient with diagnosis of afib on Eliquis for anticoagulation.    Procedure: Lumbar Epi Date of procedure: TBD   CHA2DS2-VASc Score = 3   This indicates a 3.2% annual risk of stroke. The patient's score is based upon: CHF History: 0 HTN History: 1 Diabetes History: 0 Stroke History: 0 Vascular Disease History: 0 Age Score: 1 Gender Score: 1      CrCl 71 ml/min Platelet count 319  Per office protocol, patient can hold Eliquis for 3 days prior to procedure.    **This guidance is not considered finalized until pre-operative APP has relayed final recommendations.**

## 2023-11-01 NOTE — Telephone Encounter (Signed)
Kara Warren 68 year old female is requesting preoperative cardiac evaluation for lumbar epidural injection.  She was recently seen in clinic by you on 10/15/2023.  Would you be able to comment on risk for her upcoming procedure?  -Thank you for your help.  Please direct your response to CV DIV preop pool.  Thomasene Ripple. Balthazar Dooly NP-C     11/01/2023, 8:41 AM [facility] [address] Office [phone] Fax [phone]

## 2023-11-07 NOTE — Telephone Encounter (Signed)
   Patient Name: Kara Warren  DOB: 1955/12/08 MRN: [id number]  Primary Cardiologist: Lonni Cash, MD  Chart reviewed as part of pre-operative protocol coverage. Given past medical history and time since last visit, based on ACC/AHA guidelines, Kara Warren is at acceptable risk for the planned procedure without further cardiovascular testing.   The patient was advised that if she develops new symptoms prior to surgery to contact our office to arrange for a follow-up visit, and she verbalized understanding.  Please advise patient to hold Eliquis  3 days prior to procedure and restart postprocedure when surgically safe and hemostasis is achieved.  I will route this recommendation to the requesting party via Epic fax function and remove from pre-op pool.  Please call with questions.  Wyn Raddle, Jackee Shove, NP 11/07/2023, 6:56 AM

## 2023-11-08 DIAGNOSIS — M5416 Radiculopathy, lumbar region: Secondary | ICD-10-CM | POA: Diagnosis not present

## 2023-11-12 NOTE — Discharge Instructions (Signed)

## 2023-11-13 ENCOUNTER — Ambulatory Visit
Admission: RE | Admit: 2023-11-13 | Discharge: 2023-11-13 | Disposition: A | Discharge status: Home / Self Care | Payer: Medicare HMO | Source: Ambulatory Visit | Attending: Family Medicine | Admitting: Family Medicine

## 2023-11-13 ENCOUNTER — Ambulatory Visit (INDEPENDENT_AMBULATORY_CARE_PROVIDER_SITE_OTHER): Payer: Medicare HMO | Admitting: Licensed Clinical Social Worker

## 2023-11-13 DIAGNOSIS — F4321 Adjustment disorder with depressed mood: Secondary | ICD-10-CM

## 2023-11-13 DIAGNOSIS — M4727 Other spondylosis with radiculopathy, lumbosacral region: Secondary | ICD-10-CM | POA: Diagnosis not present

## 2023-11-13 DIAGNOSIS — M5416 Radiculopathy, lumbar region: Secondary | ICD-10-CM

## 2023-11-13 MED ORDER — METHYLPREDNISOLONE ACETATE 40 MG/ML INJ SUSP (RADIOLOG
80.0000 mg | Freq: Once | INTRAMUSCULAR | Status: AC
  Administered 2023-11-13: 80 mg via EPIDURAL

## 2023-11-13 MED ORDER — IOPAMIDOL (ISOVUE-M 200) INJECTION 41%
1.0000 mL | Freq: Once | INTRAMUSCULAR | Status: AC
  Administered 2023-11-13: 1 mL via EPIDURAL

## 2023-11-15 DIAGNOSIS — Z8249 Family history of ischemic heart disease and other diseases of the circulatory system: Secondary | ICD-10-CM | POA: Diagnosis not present

## 2023-11-15 DIAGNOSIS — Z7901 Long term (current) use of anticoagulants: Secondary | ICD-10-CM | POA: Diagnosis not present

## 2023-11-15 DIAGNOSIS — I4891 Unspecified atrial fibrillation: Secondary | ICD-10-CM | POA: Diagnosis not present

## 2023-11-15 DIAGNOSIS — M8580 Other specified disorders of bone density and structure, unspecified site: Secondary | ICD-10-CM | POA: Diagnosis not present

## 2023-11-15 DIAGNOSIS — F4322 Adjustment disorder with anxiety: Secondary | ICD-10-CM | POA: Diagnosis not present

## 2023-11-15 DIAGNOSIS — K589 Irritable bowel syndrome without diarrhea: Secondary | ICD-10-CM | POA: Diagnosis not present

## 2023-11-15 DIAGNOSIS — K219 Gastro-esophageal reflux disease without esophagitis: Secondary | ICD-10-CM | POA: Diagnosis not present

## 2023-11-15 DIAGNOSIS — Z881 Allergy status to other antibiotic agents status: Secondary | ICD-10-CM | POA: Diagnosis not present

## 2023-11-15 DIAGNOSIS — N182 Chronic kidney disease, stage 2 (mild): Secondary | ICD-10-CM | POA: Diagnosis not present

## 2023-11-15 DIAGNOSIS — F325 Major depressive disorder, single episode, in full remission: Secondary | ICD-10-CM | POA: Diagnosis not present

## 2023-11-15 DIAGNOSIS — M1990 Unspecified osteoarthritis, unspecified site: Secondary | ICD-10-CM | POA: Diagnosis not present

## 2023-11-15 DIAGNOSIS — J302 Other seasonal allergic rhinitis: Secondary | ICD-10-CM | POA: Diagnosis not present

## 2023-11-16 ENCOUNTER — Encounter (HOSPITAL_COMMUNITY): Payer: Self-pay | Admitting: Licensed Clinical Social Worker

## 2023-11-16 NOTE — Progress Notes (Signed)
   THERAPIST PROGRESS NOTE  Session Time: 2:30pm-3:25pm  Participation Level: Active  Behavioral Response: Well GroomedAlertEuthymic  Type of Therapy: Individual Therapy  Treatment Goals addressed:  Hiromi will identify patterns in relationships that are related to her level of self esteem and make choices more in line with higher levels of self esteem and self-compassion   ProgressTowards Goals: Progressing  Interventions: CBT  Summary: SHENEIKA WALSTAD is a 68 y.o. female who presents with Adjustment disorder with depressed mood.   Suicidal/Homicidal: Nowithout intent/plan  Therapist Response: Dannell engaged well in individual in person session with clinician. Clinician utilized CBT to process thoughts, feelings, and interactions. Clinician explored boundary setting with friends, which has been important for coping in her relationship. Chase shared an incident that occurred with a friend who responded negatively to news about her relationship with partner. Clinician identified the importance of making her own decisions based on her experience, wants, needs, and judgement. Clinician also processed the emotional injury that can occur when people are not supportive. Clinician explored boundary setting. Clinician also reflected the importance of self-care and self-compassion, as well as being independent.   Plan: Return again in 2 weeks.  Diagnosis: Adjustment disorder with depressed mood  Collaboration of Care: Patient refused AEB none required  Patient/Guardian was advised Release of Information must be obtained prior to any record release in order to collaborate their care with an outside provider. Patient/Guardian was advised if they have not already done so to contact the registration department to sign all necessary forms in order for [place] to release information regarding their care.   Consent: Patient/Guardian gives verbal consent for treatment and assignment of benefits for services  provided during this visit. Patient/Guardian expressed understanding and agreed to proceed.   Chryl Heck [place], LCSW 11/16/2023

## 2023-11-19 DIAGNOSIS — H2513 Age-related nuclear cataract, bilateral: Secondary | ICD-10-CM | POA: Diagnosis not present

## 2023-11-19 DIAGNOSIS — H25043 Posterior subcapsular polar age-related cataract, bilateral: Secondary | ICD-10-CM | POA: Diagnosis not present

## 2023-11-19 DIAGNOSIS — H52203 Unspecified astigmatism, bilateral: Secondary | ICD-10-CM | POA: Diagnosis not present

## 2023-11-19 DIAGNOSIS — H5213 Myopia, bilateral: Secondary | ICD-10-CM | POA: Diagnosis not present

## 2023-11-19 DIAGNOSIS — H25013 Cortical age-related cataract, bilateral: Secondary | ICD-10-CM | POA: Diagnosis not present

## 2023-11-26 ENCOUNTER — Other Ambulatory Visit (HOSPITAL_COMMUNITY): Payer: Self-pay

## 2023-11-27 ENCOUNTER — Ambulatory Visit (INDEPENDENT_AMBULATORY_CARE_PROVIDER_SITE_OTHER): Payer: Medicare HMO | Admitting: Licensed Clinical Social Worker

## 2023-11-27 ENCOUNTER — Encounter: Payer: Self-pay | Admitting: Family Medicine

## 2023-11-27 DIAGNOSIS — F4321 Adjustment disorder with depressed mood: Secondary | ICD-10-CM

## 2023-11-28 ENCOUNTER — Encounter (HOSPITAL_COMMUNITY): Payer: Self-pay | Admitting: Licensed Clinical Social Worker

## 2023-11-28 NOTE — Progress Notes (Signed)
   THERAPIST PROGRESS NOTE  Session Time: 12:30pm-1:30pm  Participation Level: Active  Behavioral Response: Well GroomedAlertDepressed and Irritable  Type of Therapy: Individual Therapy  Treatment Goals addressed: Kara Warren will identify patterns in relationships that are related to her level of self esteem and make choices more in line with higher levels of self esteem and self-compassion   ProgressTowards Goals: Progressing  Interventions: CBT  Summary: Kara Warren is a 68 y.o. female who presents with Adjustment Disorder with depressed mood.   Suicidal/Homicidal: Nowithout intent/plan  Therapist Response: Kara Warren engaged well in individual and person session with clinician.  Clinician utilized CBT to process thoughts feelings and interactions.  Clinician discussed relationship with partner, noting ongoing resentments, frustrations, and concerns about lack of motivation to change.  Clinician reflected frustration, and reminded Kara Warren that men in their 62s struggle with change a lot, especially men who spent the majority of their lives married.  Kara Warren opened up about her true feelings regarding moving in together.  Kara Warren identified a shift in her willingness to go along with her partner's [employer] attitude.  Clinician reflected the importance of boundaries and open communication about her expectations of him and that the relationship.  Plan: Return again in 2 weeks.  Diagnosis: Adjustment disorder with depressed mood  Collaboration of Care: Patient refused AEB none required  Patient/Guardian was advised Release of Information must be obtained prior to any record release in order to collaborate their care with an outside provider. Patient/Guardian was advised if they have not already done so to contact the registration department to sign all necessary forms in order for [place] to release information regarding their care.   Consent: Patient/Guardian gives verbal consent for treatment and  assignment of benefits for services provided during this visit. Patient/Guardian expressed understanding and agreed to proceed.   Chryl Heck [place], LCSW 11/28/2023

## 2023-12-06 ENCOUNTER — Ambulatory Visit (HOSPITAL_COMMUNITY): Payer: Medicare HMO | Admitting: Licensed Clinical Social Worker

## 2023-12-06 ENCOUNTER — Encounter (HOSPITAL_COMMUNITY): Payer: Self-pay | Admitting: Licensed Clinical Social Worker

## 2023-12-06 DIAGNOSIS — F4321 Adjustment disorder with depressed mood: Secondary | ICD-10-CM

## 2023-12-06 NOTE — Progress Notes (Signed)
 Virtual Visit via Video Note  I connected with Kara Warren on 12/06/23 at 11:00 AM EST by a video enabled telemedicine application and verified that I am speaking with the correct person using two identifiers.  Location: Patient: home Provider: home office   I discussed the limitations of evaluation and management by telemedicine and the availability of in person appointments. The patient expressed understanding and agreed to proceed.   I discussed the assessment and treatment plan with the patient. The patient was provided an opportunity to ask questions and all were answered. The patient agreed with the plan and demonstrated an understanding of the instructions.   The patient was advised to call back or seek an in-person evaluation if the symptoms worsen or if the condition fails to improve as anticipated.  I provided 55 minutes of non-face-to-face time during this encounter.   Kara Melter, LCSW   THERAPIST PROGRESS NOTE  Session Time: 11:00am-11:55am  Participation Level: Active  Behavioral Response: CasualAlertDepressed  Type of Therapy: Individual Therapy  Treatment Goals addressed: Kara Warren will identify patterns in relationships that are related to her level of self esteem and make choices more in line with higher levels of self esteem and self-compassion   ProgressTowards Goals: Progressing  Interventions: CBT  Summary: Kara Warren is a 68 y.o. female who presents with Adjustment disorder with depressed mood.   Suicidal/Homicidal: Nowithout intent/plan  Therapist Response: Kara Warren engaged well in individual virtual session with Facilities manager.  Clinician utilized CBT to process thoughts feelings and interactions.  Clinician explored updates in relationship with partner and noted recent break-up.  Kara Warren shared the story, noting her role in making decisions about the relationship and the frustration with her ex-partner and his behavior.  Clinician provided time  and space for Kara Warren to share her feelings.  Clinician identified significant change in attitude in this relationship, noting increase in self-esteem, decrease in putting up with nonsense, and more assertive boundary setting.  Clinician noted that this was not successful in this relationship due to her partner's unwillingness to change.  However, clinician identified significant wins where Kara Warren spoke up for herself and was able to make her own decisions based on what she wanted, not what he wanted.  Clinician provided information about grief and noted that this will be a process that requires time.  Clinician also encouraged Kara Warren to reach out to her support system, namely her girlfriends for support, laughter, and joy.  Plan: Return again in 1 weeks.  Diagnosis: Adjustment disorder with depressed mood  Collaboration of Care: Patient refused AEB none required  Patient/Guardian was advised Release of Information must be obtained prior to any record release in order to collaborate their care with an outside provider. Patient/Guardian was advised if they have not already done so to contact the registration department to sign all necessary forms in order for [place] to release information regarding their care.   Consent: Patient/Guardian gives verbal consent for treatment and assignment of benefits for services provided during this visit. Patient/Guardian expressed understanding and agreed to proceed.   Kara Heck [place], LCSW 12/06/2023

## 2023-12-11 ENCOUNTER — Ambulatory Visit (INDEPENDENT_AMBULATORY_CARE_PROVIDER_SITE_OTHER): Payer: Medicare HMO | Admitting: Licensed Clinical Social Worker

## 2023-12-11 ENCOUNTER — Encounter (HOSPITAL_COMMUNITY): Payer: Self-pay

## 2023-12-11 DIAGNOSIS — F4321 Adjustment disorder with depressed mood: Secondary | ICD-10-CM | POA: Diagnosis not present

## 2023-12-11 NOTE — Progress Notes (Signed)
   THERAPIST PROGRESS NOTE  Session Time: 12:30pm-1:25pm  Participation Level: Active  Behavioral Response: Well GroomedAlertEuthymic  Type of Therapy: Individual Therapy  Treatment Goals addressed:  Ilyana will identify patterns in relationships that are related to her level of self esteem and make choices more in line with higher levels of self esteem and self-compassion   ProgressTowards Goals: Progressing  Interventions: Motivational Interviewing  Summary: GORDON CARLSON is a 68 y.o. female who presents with Adjustment Disorder with depressed mood.   Suicidal/Homicidal: Nowithout intent/plan  Therapist Response: Christain engaged well in individual session in office with clinician.  Clinician utilized motivational interviewing to process thoughts and feelings about recent break-up.  Clinician reflected frustration, sadness, and also anger about the way she has been treated by this man for the past few years.  Clinician identified many ways in which we put on blinders to avoid increased discomfort in relationships.  Clinician identified multiple experiences similar to this last break-up where feelings were hurt and behaviors were forgiven.  Clinician reflected the sadness of losing relationships with ex-boyfriend's family.  Clinician explored ways that Kearia can move forward and focus on her own wants and needs.  Plan: Return again in 2 weeks.  Diagnosis: Adjustment disorder with depressed mood  Collaboration of Care: Patient refused AEB none required  Patient/Guardian was advised Release of Information must be obtained prior to any record release in order to collaborate their care with an outside provider. Patient/Guardian was advised if they have not already done so to contact the registration department to sign all necessary forms in order for [place] to release information regarding their care.   Consent: Patient/Guardian gives verbal consent for treatment and assignment of benefits for  services provided during this visit. Patient/Guardian expressed understanding and agreed to proceed.   Chryl Heck [place], LCSW 12/11/2023

## 2023-12-14 ENCOUNTER — Telehealth: Payer: Self-pay | Admitting: *Deleted

## 2023-12-14 DIAGNOSIS — Z1321 Encounter for screening for nutritional disorder: Secondary | ICD-10-CM

## 2023-12-14 DIAGNOSIS — Z1329 Encounter for screening for other suspected endocrine disorder: Secondary | ICD-10-CM

## 2023-12-14 DIAGNOSIS — E782 Mixed hyperlipidemia: Secondary | ICD-10-CM

## 2023-12-14 DIAGNOSIS — R5383 Other fatigue: Secondary | ICD-10-CM

## 2023-12-14 DIAGNOSIS — Z0000 Encounter for general adult medical examination without abnormal findings: Secondary | ICD-10-CM

## 2023-12-14 DIAGNOSIS — I10 Essential (primary) hypertension: Secondary | ICD-10-CM

## 2023-12-14 NOTE — Telephone Encounter (Signed)
 Copied from CRM [id number]. Topic: Clinical - Request for Lab/Test Order >> Dec 14, 2023  9:12 AM Marica Otter wrote: Reason for CRM: Patient would like to have her fasting physical labs done prior to her appointment with Dr. Ardyth Harps on 6/25, patient had to reschedule physical appointment and no available mornings. Lab appointment scheduled for 6/18, if any issues please reach out to  patient, thanks.  [place] [phone]

## 2023-12-17 ENCOUNTER — Encounter: Payer: Medicare HMO | Admitting: Internal Medicine

## 2023-12-17 NOTE — Telephone Encounter (Signed)
 Labs ordered.

## 2023-12-18 ENCOUNTER — Encounter (HOSPITAL_COMMUNITY): Payer: Self-pay | Admitting: Licensed Clinical Social Worker

## 2023-12-20 ENCOUNTER — Other Ambulatory Visit: Payer: Self-pay | Admitting: Internal Medicine

## 2023-12-20 ENCOUNTER — Other Ambulatory Visit (HOSPITAL_COMMUNITY): Payer: Self-pay

## 2023-12-20 DIAGNOSIS — F330 Major depressive disorder, recurrent, mild: Secondary | ICD-10-CM

## 2023-12-20 MED ORDER — CITALOPRAM HYDROBROMIDE 20 MG PO TABS
20.0000 mg | ORAL_TABLET | Freq: Every day | ORAL | 0 refills | Status: DC
Start: 2023-12-20 — End: 2024-02-26
  Filled 2023-12-20: qty 90, 90d supply, fill #0

## 2023-12-27 ENCOUNTER — Other Ambulatory Visit (HOSPITAL_COMMUNITY): Payer: Self-pay

## 2023-12-28 ENCOUNTER — Other Ambulatory Visit (HOSPITAL_COMMUNITY): Payer: Self-pay

## 2023-12-28 MED ORDER — AMOXICILLIN 500 MG PO CAPS
500.0000 mg | ORAL_CAPSULE | Freq: Three times a day (TID) | ORAL | 1 refills | Status: DC
  Filled 2023-12-28: qty 21, 7d supply, fill #0
  Filled 2023-12-31 – 2024-01-01 (×2): qty 21, 7d supply, fill #1

## 2023-12-31 ENCOUNTER — Other Ambulatory Visit (HOSPITAL_COMMUNITY): Payer: Self-pay

## 2024-01-01 ENCOUNTER — Encounter (HOSPITAL_COMMUNITY): Payer: Self-pay | Admitting: Licensed Clinical Social Worker

## 2024-01-01 ENCOUNTER — Ambulatory Visit (INDEPENDENT_AMBULATORY_CARE_PROVIDER_SITE_OTHER): Payer: Medicare HMO | Admitting: Licensed Clinical Social Worker

## 2024-01-01 DIAGNOSIS — F4321 Adjustment disorder with depressed mood: Secondary | ICD-10-CM

## 2024-01-01 NOTE — Progress Notes (Signed)
   THERAPIST PROGRESS NOTE  Session Time: 1:30pm-2:25pm  Participation Level: Active  Behavioral Response: Well GroomedAlertEuthymic  Type of Therapy: Individual Therapy  Treatment Goals addressed: Kara Warren will identify patterns in relationships that are related to her level of self esteem and make choices more in line with higher levels of self esteem and self-compassion     ProgressTowards Goals: Progressing  Interventions: Motivational Interviewing  Summary: Kara Warren is a 68 y.o. female who presents with Adjustment Disorder with depressed mood.   Suicidal/Homicidal: Nowithout intent/plan  Therapist Response: Jatziri engaged well in individual in person session with clinician. Clinician utilized MI OARS to reflect and summarize thoughts and feelings. Clinician explored updates in mood and interactions, socialization with friends and family. Mazelle identified ups and downs regarding grief over relationship ending. Clinician processed the grief and normalized this experience, noting that the ups and downs are to be expected. Clinician validated hurt feelings, as well as some anger, and some relief that she can get back to herself. Clinician explored Ladrea's plans to return to previous activities, as well as plans to try new things, travel, and spend time with friends and neighbors. Clinician processed the importance of building and maintaining her village.   Plan: Return again in 2 weeks.  Diagnosis: Adjustment disorder with depressed mood  Collaboration of Care: Patient refused AEB none required  Patient/Guardian was advised Release of Information must be obtained prior to any record release in order to collaborate their care with an outside provider. Patient/Guardian was advised if they have not already done so to contact the registration department to sign all necessary forms in order for [place] to release information regarding their care.   Consent: Patient/Guardian gives verbal  consent for treatment and assignment of benefits for services provided during this visit. Patient/Guardian expressed understanding and agreed to proceed.   Chryl Heck [place], LCSW 01/01/2024

## 2024-01-07 ENCOUNTER — Other Ambulatory Visit (HOSPITAL_COMMUNITY): Payer: Self-pay

## 2024-01-07 MED ORDER — METHYLPREDNISOLONE 4 MG PO TBPK
ORAL_TABLET | ORAL | 0 refills | Status: AC
  Filled 2024-01-07: qty 21, 6d supply, fill #0

## 2024-01-09 DIAGNOSIS — M5416 Radiculopathy, lumbar region: Secondary | ICD-10-CM | POA: Diagnosis not present

## 2024-01-15 ENCOUNTER — Ambulatory Visit (INDEPENDENT_AMBULATORY_CARE_PROVIDER_SITE_OTHER): Admitting: Licensed Clinical Social Worker

## 2024-01-15 DIAGNOSIS — F4321 Adjustment disorder with depressed mood: Secondary | ICD-10-CM | POA: Diagnosis not present

## 2024-01-16 ENCOUNTER — Encounter (HOSPITAL_COMMUNITY): Payer: Self-pay | Admitting: Licensed Clinical Social Worker

## 2024-01-16 NOTE — Progress Notes (Signed)
   THERAPIST PROGRESS NOTE  Session Time: 12:30pm-1:25pm  Participation Level: Active  Behavioral Response: Well GroomedAlertSad but not depressed  Type of Therapy: Individual Therapy  Treatment Goals addressed:  Kara Warren will identify patterns in relationships that are related to her level of self esteem and make choices more in line with higher levels of self esteem and self-compassion   ProgressTowards Goals: Progressing  Interventions: CBT  Summary: Kara Warren is a 68 y.o. female who presents with Adjustment disorder with depressed mood.   Suicidal/Homicidal: Nowithout intent/plan  Therapist Response: Kaleya engaged well in individual and person session with clinician.  Clinician utilized CBT to process thoughts feelings and interactions.  Clinician explored recent communication and interaction with ex-boyfriend.  Clinician explored the difference in Kara Warren's attitude and confidence while sharing details of this interaction.  Clinician identified a more clear view of Kara Warren's values, and the differences she sees with ex-boyfriend.  Clinician explored boundary setting with friends.  Clinician validated Kara Warren's interest in limiting interactions with those people associated with the ex-boyfriend.  Clinician reminded Kara Warren that she can change her mind in the future if and when she is ready.  Kara Warren shared that her confidence is up and she is overall feeling quite well.  Plan: Return again in 2 weeks.  Diagnosis: Adjustment disorder with depressed mood  Collaboration of Care: Patient refused AEB none required  Patient/Guardian was advised Release of Information must be obtained prior to any record release in order to collaborate their care with an outside provider. Patient/Guardian was advised if they have not already done so to contact the registration department to sign all necessary forms in order for us  to release information regarding their care.   Consent: Patient/Guardian gives verbal  consent for treatment and assignment of benefits for services provided during this visit. Patient/Guardian expressed understanding and agreed to proceed.   Kara Warren [place], LCSW 01/16/2024

## 2024-01-18 DIAGNOSIS — Z0100 Encounter for examination of eyes and vision without abnormal findings: Secondary | ICD-10-CM | POA: Diagnosis not present

## 2024-01-18 DIAGNOSIS — H524 Presbyopia: Secondary | ICD-10-CM | POA: Diagnosis not present

## 2024-01-20 ENCOUNTER — Other Ambulatory Visit: Payer: Self-pay | Admitting: Cardiology

## 2024-01-20 DIAGNOSIS — I480 Paroxysmal atrial fibrillation: Secondary | ICD-10-CM

## 2024-01-21 ENCOUNTER — Other Ambulatory Visit (HOSPITAL_COMMUNITY): Payer: Self-pay

## 2024-01-21 MED ORDER — APIXABAN 5 MG PO TABS
5.0000 mg | ORAL_TABLET | Freq: Two times a day (BID) | ORAL | 5 refills | Status: DC
Start: 2024-01-21 — End: 2024-08-11
  Filled 2024-01-21: qty 60, 30d supply, fill #0
  Filled 2024-02-26: qty 60, 30d supply, fill #1
  Filled 2024-04-02: qty 60, 30d supply, fill #2
  Filled 2024-05-05: qty 60, 30d supply, fill #3
  Filled 2024-06-13: qty 60, 30d supply, fill #4
  Filled 2024-07-13: qty 60, 30d supply, fill #5

## 2024-01-21 NOTE — Telephone Encounter (Signed)
 Prescription refill request for Eliquis  received. Indication:afib Last office visit:1/25 Scr:0.76  1/25 Age: 68 Weight:63.1  kg  Prescription refilled

## 2024-01-22 DIAGNOSIS — L814 Other melanin hyperpigmentation: Secondary | ICD-10-CM | POA: Diagnosis not present

## 2024-01-22 DIAGNOSIS — L821 Other seborrheic keratosis: Secondary | ICD-10-CM | POA: Diagnosis not present

## 2024-01-22 DIAGNOSIS — D225 Melanocytic nevi of trunk: Secondary | ICD-10-CM | POA: Diagnosis not present

## 2024-01-29 ENCOUNTER — Encounter (HOSPITAL_COMMUNITY): Payer: Self-pay | Admitting: Licensed Clinical Social Worker

## 2024-01-29 ENCOUNTER — Ambulatory Visit (INDEPENDENT_AMBULATORY_CARE_PROVIDER_SITE_OTHER): Admitting: Licensed Clinical Social Worker

## 2024-01-29 DIAGNOSIS — F4321 Adjustment disorder with depressed mood: Secondary | ICD-10-CM

## 2024-01-29 NOTE — Progress Notes (Signed)
   THERAPIST PROGRESS NOTE  Session Time: 12:30-1:25pm  Participation Level: Active  Behavioral Response: Well GroomedAlertEuthymic  Type of Therapy: Individual Therapy  Treatment Goals addressed: Berneita will identify patterns in relationships that are related to her level of self esteem and make choices more in line with higher levels of self esteem and self-compassion   ProgressTowards Goals: Progressing  Interventions: Motivational Interviewing  Summary: Kara Warren is a 68 y.o. female who presents with Adjustment Disorder with depressed mood.   Suicidal/Homicidal: Nowithout intent/plan  Therapist Response: Kara Warren engaged well in individual in person session. Clinician utilized MI OARS to reflect and summarize thoughts, feelings, and interactions. Clinician explored socialization and recent urge to change social scene. Clinician processed options of where to hang out, what to do, and what types of people she would like to get to know. Kara Warren identified interest in athletics, active lifestyle, and reduction of alcohol in her activities. Clinician identified the importance of taking her own path and working towards her goal of making more positive choices for herself. Kara Warren also reflected on a recent conversation with ex-partner, which quickly became negative and hurtful, so Kara Warren hung up the phone. She shared a sense of empowerment and no shame in taking control over her space. Clinician validated this choice and noted a new sense of control and self esteem.   Plan: Return again in 2 weeks.  Diagnosis: Adjustment disorder with depressed mood  Collaboration of Care: Patient refused AEB none required  Patient/Guardian was advised Release of Information must be obtained prior to any record release in order to collaborate their care with an outside provider. Patient/Guardian was advised if they have not already done so to contact the registration department to sign all necessary forms in  order for us  to release information regarding their care.   Consent: Patient/Guardian gives verbal consent for treatment and assignment of benefits for services provided during this visit. Patient/Guardian expressed understanding and agreed to proceed.   Kara Warren [place], LCSW 01/29/2024

## 2024-02-12 ENCOUNTER — Ambulatory Visit (INDEPENDENT_AMBULATORY_CARE_PROVIDER_SITE_OTHER): Admitting: Licensed Clinical Social Worker

## 2024-02-12 ENCOUNTER — Encounter (HOSPITAL_COMMUNITY): Payer: Self-pay | Admitting: Licensed Clinical Social Worker

## 2024-02-12 DIAGNOSIS — F4321 Adjustment disorder with depressed mood: Secondary | ICD-10-CM | POA: Diagnosis not present

## 2024-02-12 NOTE — Progress Notes (Signed)
   THERAPIST PROGRESS NOTE  Session Time: 12:30pm-1:25pm  Participation Level: Active  Behavioral Response: Well GroomedAlertEuthymic  Type of Therapy: Individual Therapy  Treatment Goals addressed: Shanedra will identify patterns in relationships that are related to her level of self esteem and make choices more in line with higher levels of self esteem and self-compassion   ProgressTowards Goals: Progressing  Interventions: Motivational Interviewing  Summary: JUSTENE HINCHCLIFFE is a 68 y.o. female who presents with Adjustment Disorder with depressed mood.   Suicidal/Homicidal: Nowithout intent/plan  Therapist Response: Shalea engaged well in individual in person session with clinician. Clinician utilized MI OARS to reflect and summarize thoughts and feelings. Clinician explored updates in adjustment to being single in her home again. Clinician identified increase in socialization, spending more time with friends and family, and returning to previous hobbies and activities. Clinician reflected a lightness in Doratha's attitude, noting that a lot of stress has dissolved since ending the previous relationship. Clinician discussed updates in dating and processed a new friendship that has come about over the past few weeks. Clinician reflected joy and comfort, as well as an approach of honesty and clear expectations. Clinician reflected the change in Adalynd's "like it or not" attitude, which is different from the "people pleaser" that was present in past relationships.   Plan: Return again in 2-3 weeks.  Diagnosis: Adjustment disorder with depressed mood  Collaboration of Care: Patient refused AEB none required  Patient/Guardian was advised Release of Information must be obtained prior to any record release in order to collaborate their care with an outside provider. Patient/Guardian was advised if they have not already done so to contact the registration department to sign all necessary forms in order  for us  to release information regarding their care.   Consent: Patient/Guardian gives verbal consent for treatment and assignment of benefits for services provided during this visit. Patient/Guardian expressed understanding and agreed to proceed.   Merleen Stare [place], LCSW 02/12/2024

## 2024-02-24 ENCOUNTER — Telehealth: Admitting: Family Medicine

## 2024-02-24 DIAGNOSIS — N390 Urinary tract infection, site not specified: Secondary | ICD-10-CM

## 2024-02-24 MED ORDER — NITROFURANTOIN MONOHYD MACRO 100 MG PO CAPS: 100.0000 mg | ORAL_CAPSULE | Freq: Two times a day (BID) | ORAL | 0 refills | Status: AC

## 2024-02-24 NOTE — Patient Instructions (Signed)

## 2024-02-24 NOTE — Progress Notes (Signed)
 Virtual Visit Consent   Kara Warren, you are scheduled for a virtual visit with a [facility] provider today. Just as with appointments in the office, your consent must be obtained to participate. Your consent will be active for this visit and any virtual visit you may have with one of our providers in the next 365 days. If you have a MyChart account, a copy of this consent can be sent to you electronically.  As this is a virtual visit, video technology does not allow for your provider to perform a traditional examination. This may limit your provider's ability to fully assess your condition. If your provider identifies any concerns that need to be evaluated in person or the need to arrange testing (such as labs, EKG, etc.), we will make arrangements to do so. Although advances in technology are sophisticated, we cannot ensure that it will always work on either your end or our end. If the connection with a video visit is poor, the visit may have to be switched to a telephone visit. With either a video or telephone visit, we are not always able to ensure that we have a secure connection.  By engaging in this virtual visit, you consent to the provision of healthcare and authorize for your insurance to be billed (if applicable) for the services provided during this visit. Depending on your insurance coverage, you may receive a charge related to this service.  I need to obtain your verbal consent now. Are you willing to proceed with your visit today? Trinda A Gao has provided verbal consent on 02/24/2024 for a virtual visit (video or telephone). Albertha Huger, FNP  Date: 02/24/2024 3:22 PM   Virtual Visit via Video Note   I, Albertha Huger, connected with  Kara Warren  ([id number], 1956/02/02) on 02/24/24 at  3:15 PM EDT by a video-enabled telemedicine application and verified that I am speaking with the correct person using two identifiers.  Location: Patient: Virtual Visit Location Patient:  Home Provider: Virtual Visit Location Provider: Home Office   I discussed the limitations of evaluation and management by telemedicine and the availability of in person appointments. The patient expressed understanding and agreed to proceed.    History of Present Illness: Kara Warren is a 68 y.o. who identifies as a female who was assigned female at birth, and is being seen today for burning, frequency and pressure with urination. Sx for 2 days.   HPI: HPI  Problems:  Patient Active Problem List   Diagnosis Date Noted   Acquired thrombophilia (HCC) 09/26/2021   Colonoscopy causing post-procedural bleeding 09/20/2020   Essential hypertension 09/20/2020   PAF (paroxysmal atrial fibrillation) (HCC) 09/20/2020   Plantar wart 04/02/2014   Peroneal tendinitis of right lower extremity 03/12/2014   Diverticulosis 07/27/2012   Sinusitis, chronic    Colon polyp    Menopause    Migraine    NONSPCIFC ABN FINDING RAD & OTH EXAM LUNG FIELD 11/19/2009   HIP PAIN, RIGHT 04/12/2009   Hyperlipidemia 08/25/2008   Depression 08/25/2008    Allergies:  Allergies  Allergen Reactions   Iohexol      Pt developed itching, redness, and hives after steroid injeciton (included lidocaine , kenalog , and Iohexol ). Unsure which one caused the reaction. Pt took 50 mg of benadryl prior to injection and did not have breakthrough reaction. Okay per Dr Laurie Poplar. AV, RN 11/12/23   Lidocaine      Pt developed itching, redness, and hives after steroid injeciton (included lidocaine , kenalog , and Iohexol ).  Unsure which one caused the reaction. Pt will need 13 hr prep prior to any future Steroid injections. AV, RN   Augmentin  [Amoxicillin -Pot Clavulanate] Diarrhea   Medications:  Current Outpatient Medications:    nitrofurantoin, macrocrystal-monohydrate, (MACROBID) 100 MG capsule, Take 1 capsule (100 mg total) by mouth 2 (two) times daily for 7 days., Disp: 14 capsule, Rfl: 0   amoxicillin  (AMOXIL ) 500 MG capsule, Take  2 capsules to start, then 1 capsule by mouth 3 (three) times daily until gone., Disp: 21 capsule, Rfl: 1   apixaban  (ELIQUIS ) 5 MG TABS tablet, Take 1 tablet (5 mg total) by mouth 2 (two) times daily., Disp: 60 tablet, Rfl: 5   azelastine  (ASTELIN ) 0.1 % nasal spray, Place 2 sprays into both nostrils 2 (two) times daily as directed, Disp: 30 mL, Rfl: 12   Calcium Carbonate-Vitamin D  (CALTRATE 600+D PO), Take 1 tablet by mouth daily., Disp: , Rfl:    cefdinir  (OMNICEF ) 300 MG capsule, Take 1 capsule (300 mg total) by mouth 2 (two) times daily., Disp: 20 capsule, Rfl: 0   citalopram  (CELEXA ) 20 MG tablet, Take 1 tablet (20 mg total) by mouth daily., Disp: 90 tablet, Rfl: 0   diltiazem  (CARDIZEM ) 30 MG tablet, Take 1 tablet (30 mg total) by mouth 2 (two) times daily as needed (for afib symptoms)., Disp: 180 tablet, Rfl: 3   diphenhydrAMINE-zinc acetate (BENADRYL) cream, Apply 1 Application topically 3 (three) times daily as needed for itching., Disp: , Rfl:    fluticasone  (FLONASE ) 50 MCG/ACT nasal spray, Place 1-2 sprays into both nostrils daily. (Patient taking differently: Place 1-2 sprays into both nostrils as needed for allergies or rhinitis.), Disp: 16 g, Rfl: 3   hydrOXYzine  (ATARAX ) 25 MG tablet, Take 1/2-1 tablet (12.5-25 mg total) by mouth every 8 (eight) hours as needed for itching., Disp: 30 tablet, Rfl: 0   ipratropium (ATROVENT ) 0.03 % nasal spray, Place 2 sprays into both nostrils every 12 (twelve) hours. As needed., Disp: 30 mL, Rfl: 0   levocetirizine (XYZAL) 5 MG tablet, Take 5 mg by mouth every evening., Disp: , Rfl:    Multiple Vitamins-Minerals (MULTI FOR HER 50+) TABS, Take 1 tablet by mouth daily., Disp: , Rfl:    predniSONE  (DELTASONE ) 20 MG tablet, Take 2 tablets daily with breakfast., Disp: 10 tablet, Rfl: 0   Probiotic Product (PROBIOTIC PO), Take 1 capsule by mouth daily. Ultra Flora, Disp: , Rfl:    vitamin C (ASCORBIC ACID) 500 MG tablet, Take 500 mg by mouth daily., Disp: ,  Rfl:   Observations/Objective: Patient is well-developed, well-nourished in no acute distress.  Resting comfortably  at home.  Head is normocephalic, atraumatic.  No labored breathing.  Speech is clear and coherent with logical content.  Patient is alert and oriented at baseline.    Assessment and Plan: 1. Urinary tract infection without hematuria, site unspecified (Primary)  Increase fluids, preventative measures discussed, UC if sx worsen.   Follow Up Instructions: I discussed the assessment and treatment plan with the patient. The patient was provided an opportunity to ask questions and all were answered. The patient agreed with the plan and demonstrated an understanding of the instructions.  A copy of instructions were sent to the patient via MyChart unless otherwise noted below.    The patient was advised to call back or seek an in-person evaluation if the symptoms worsen or if the condition fails to improve as anticipated.    Kaliana Albino, FNP

## 2024-02-26 ENCOUNTER — Other Ambulatory Visit: Payer: Self-pay | Admitting: Internal Medicine

## 2024-02-26 ENCOUNTER — Other Ambulatory Visit (HOSPITAL_COMMUNITY): Payer: Self-pay

## 2024-02-26 ENCOUNTER — Ambulatory Visit (INDEPENDENT_AMBULATORY_CARE_PROVIDER_SITE_OTHER): Admitting: Licensed Clinical Social Worker

## 2024-02-26 ENCOUNTER — Encounter (HOSPITAL_COMMUNITY): Payer: Self-pay | Admitting: Licensed Clinical Social Worker

## 2024-02-26 DIAGNOSIS — F4321 Adjustment disorder with depressed mood: Secondary | ICD-10-CM

## 2024-02-26 DIAGNOSIS — F330 Major depressive disorder, recurrent, mild: Secondary | ICD-10-CM

## 2024-02-26 DIAGNOSIS — J302 Other seasonal allergic rhinitis: Secondary | ICD-10-CM

## 2024-02-26 NOTE — Progress Notes (Signed)
   THERAPIST PROGRESS NOTE  Session Time: 12:30pm-1:25pm  Participation Level: Active  Behavioral Response: Well GroomedAlertEuthymic  Type of Therapy: Individual Therapy  Treatment Goals addressed:  [name] will identify patterns in relationships that are related to her level of self esteem and make choices more in line with higher levels of self esteem and self-compassion   ProgressTowards Goals: Progressing  Interventions: CBT  Summary: Kara Warren is a 68 y.o. female who presents with Adjustment disorder with depressed mood; grief.   Suicidal/Homicidal: Nowithout intent/plan  Therapist Response: Annalisia engaged well in individual in person session with clinician. Clinician utilized CBT to process triggers, thoughts, feelings, and behaviors. Clinician processed the ongoing reminders of grief following breakup with ex-boyfriend. Clinician normalized this sense of grief and encouraged [name] to give herself permission to feel her feelings without guilt or frustration with herself. Clinician processed the recent interaction with ex and noted that the love is still present. Clinician discussed new friendship with a man she met through friends. [name] shared a lot of positivity with this person and noted feeling comfortable, being herself, and she is allowing him to take the lead on the courtship. Clinician reflected the happiness and also identified that this is the early stage of "wooing", so the she is seeing the best behavior and most attentiveness. However, [name] shared that the communication and respect is legitimate, and she is really enjoying getting to know him.    Plan: Return again in 2-3 weeks.  Diagnosis: Adjustment disorder with depressed mood  Grief  Collaboration of Care: Patient refused AEB none required  Patient/Guardian was advised Release of Information must be obtained prior to any record release in order to collaborate their care with an outside provider.  Patient/Guardian was advised if they have not already done so to contact the registration department to sign all necessary forms in order for us  to release information regarding their care.   Consent: Patient/Guardian gives verbal consent for treatment and assignment of benefits for services provided during this visit. Patient/Guardian expressed understanding and agreed to proceed.   [name], LCSW 02/26/2024

## 2024-02-27 ENCOUNTER — Other Ambulatory Visit (HOSPITAL_COMMUNITY): Payer: Self-pay

## 2024-02-27 ENCOUNTER — Other Ambulatory Visit: Payer: Self-pay

## 2024-02-27 MED ORDER — CITALOPRAM HYDROBROMIDE 20 MG PO TABS
20.0000 mg | ORAL_TABLET | Freq: Every day | ORAL | 0 refills | Status: DC
  Filled 2024-02-27: qty 90, 90d supply, fill #0

## 2024-02-27 MED ORDER — AZELASTINE HCL 0.1 % NA SOLN
2.0000 | Freq: Two times a day (BID) | NASAL | 12 refills | Status: AC
  Filled 2024-02-27: qty 30, 30d supply, fill #0
  Filled 2024-06-13: qty 30, 30d supply, fill #1
  Filled 2024-10-09: qty 30, 30d supply, fill #2

## 2024-03-18 ENCOUNTER — Ambulatory Visit (HOSPITAL_COMMUNITY): Admitting: Licensed Clinical Social Worker

## 2024-03-18 DIAGNOSIS — F4321 Adjustment disorder with depressed mood: Secondary | ICD-10-CM

## 2024-03-19 ENCOUNTER — Other Ambulatory Visit (INDEPENDENT_AMBULATORY_CARE_PROVIDER_SITE_OTHER)

## 2024-03-19 DIAGNOSIS — E782 Mixed hyperlipidemia: Secondary | ICD-10-CM

## 2024-03-19 DIAGNOSIS — R5383 Other fatigue: Secondary | ICD-10-CM | POA: Diagnosis not present

## 2024-03-19 DIAGNOSIS — Z1321 Encounter for screening for nutritional disorder: Secondary | ICD-10-CM

## 2024-03-19 DIAGNOSIS — Z1329 Encounter for screening for other suspected endocrine disorder: Secondary | ICD-10-CM | POA: Diagnosis not present

## 2024-03-19 DIAGNOSIS — I10 Essential (primary) hypertension: Secondary | ICD-10-CM | POA: Diagnosis not present

## 2024-03-19 LAB — COMPREHENSIVE METABOLIC PANEL WITH GFR
ALT: 20 U/L (ref 0–35)
AST: 24 U/L (ref 0–37)
Albumin: 4.6 g/dL (ref 3.5–5.2)
Alkaline Phosphatase: 53 U/L (ref 39–117)
BUN: 14 mg/dL (ref 6–23)
CO2: 31 meq/L (ref 19–32)
Calcium: 9.6 mg/dL (ref 8.4–10.5)
Chloride: 99 meq/L (ref 96–112)
Creatinine, Ser: 0.72 mg/dL (ref 0.40–1.20)
GFR: 85.62 mL/min (ref >60.00)
Glucose, Bld: 90 mg/dL (ref 70–99)
Potassium: 4.1 meq/L (ref 3.5–5.1)
Sodium: 135 meq/L (ref 135–145)
Total Bilirubin: 0.7 mg/dL (ref 0.2–1.2)
Total Protein: 7.3 g/dL (ref 6.0–8.3)

## 2024-03-19 LAB — LIPID PANEL
Cholesterol: 199 mg/dL (ref 0–200)
HDL: 84.2 mg/dL (ref >39.00)
LDL Cholesterol: 101 mg/dL — ABNORMAL HIGH (ref 0–99)
NonHDL: 114.69
Total CHOL/HDL Ratio: 2
Triglycerides: 68 mg/dL (ref 0.0–149.0)
VLDL: 13.6 mg/dL (ref 0.0–40.0)

## 2024-03-19 LAB — VITAMIN D 25 HYDROXY (VIT D DEFICIENCY, FRACTURES): VITD: 45.67 ng/mL (ref 30.00–100.00)

## 2024-03-19 LAB — CBC WITH DIFFERENTIAL/PLATELET
Basophils Absolute: 0 10*3/uL (ref 0.0–0.1)
Basophils Relative: 0.6 % (ref 0.0–3.0)
Eosinophils Absolute: 0.1 10*3/uL (ref 0.0–0.7)
Eosinophils Relative: 1.2 % (ref 0.0–5.0)
HCT: 40 % (ref 36.0–46.0)
Hemoglobin: 13.4 g/dL (ref 12.0–15.0)
Lymphocytes Relative: 29.6 % (ref 12.0–46.0)
Lymphs Abs: 1.6 10*3/uL (ref 0.7–4.0)
MCHC: 33.5 g/dL (ref 30.0–36.0)
MCV: 99.1 fl (ref 78.0–100.0)
Monocytes Absolute: 0.3 10*3/uL (ref 0.1–1.0)
Monocytes Relative: 6 % (ref 3.0–12.0)
Neutro Abs: 3.3 10*3/uL (ref 1.4–7.7)
Neutrophils Relative %: 62.6 % (ref 43.0–77.0)
Platelets: 327 10*3/uL (ref 150.0–400.0)
RBC: 4.04 Mil/uL (ref 3.87–5.11)
RDW: 12.8 % (ref 11.5–15.5)
WBC: 5.3 10*3/uL (ref 4.0–10.5)

## 2024-03-19 LAB — VITAMIN B12: Vitamin B-12: 899 pg/mL (ref 211–911)

## 2024-03-19 LAB — TSH: TSH: 0.8 u[IU]/mL (ref 0.35–5.50)

## 2024-03-24 ENCOUNTER — Encounter (HOSPITAL_COMMUNITY): Payer: Self-pay | Admitting: Licensed Clinical Social Worker

## 2024-03-24 NOTE — Progress Notes (Signed)
   THERAPIST PROGRESS NOTE  Session Time: 11:00am-11:55am  Participation Level: Active  Behavioral Response: Well GroomedAlertAnxious  Type of Therapy: Individual Therapy  Treatment Goals addressed: Thai will identify patterns in relationships that are related to her level of self esteem and make choices more in line with higher levels of self esteem and self-compassion   ProgressTowards Goals: Progressing  Interventions: Assertiveness Training  Summary: HARSHINI TRENT is a 68 y.o. female who presents with Adjustment Disorder with depressed mood.   Suicidal/Homicidal: Nowithout intent/plan  Therapist Response: Quinteria engaged well in individual and person session with clinician.  Clinician utilized assertiveness training in counseling today.  Clinician explored and reviewed recent incidents with the man she was seeing.  Clinician processed thoughts and feelings about his behavior toward her, and her behavior in response.  Clinician explored comfort, as well as fear different situations with him.  Clinician discussed Hailea's ability to stand up for herself and to make her own decisions based on her comfort.  Clinician noted that Moriyah gave many chances for this man to act appropriately.  However, eventually she stepped away, realizing this was an unsafe situation.  Plan: Return again in 2 weeks.  Diagnosis: Adjustment disorder with depressed mood  Collaboration of Care: None required  Patient/Guardian was advised Release of Information must be obtained prior to any record release in order to collaborate their care with an outside provider. Patient/Guardian was advised if they have not already done so to contact the registration department to sign all necessary forms in order for us  to release information regarding their care.   Consent: Patient/Guardian gives verbal consent for treatment and assignment of benefits for services provided during this visit. Patient/Guardian expressed  understanding and agreed to proceed.   [name] [place], LCSW 03/24/2024

## 2024-03-25 ENCOUNTER — Encounter: Payer: Self-pay | Admitting: Internal Medicine

## 2024-03-25 ENCOUNTER — Ambulatory Visit: Payer: Self-pay | Admitting: Internal Medicine

## 2024-03-26 ENCOUNTER — Ambulatory Visit: Admitting: Internal Medicine

## 2024-03-26 ENCOUNTER — Encounter: Payer: Self-pay | Admitting: Internal Medicine

## 2024-03-26 VITALS — BP 110/70 | HR 60 | Temp 98.0°F | Ht 64.5 in | Wt 136.7 lb

## 2024-03-26 DIAGNOSIS — I480 Paroxysmal atrial fibrillation: Secondary | ICD-10-CM | POA: Diagnosis not present

## 2024-03-26 DIAGNOSIS — E782 Mixed hyperlipidemia: Secondary | ICD-10-CM

## 2024-03-26 DIAGNOSIS — Z0000 Encounter for general adult medical examination without abnormal findings: Secondary | ICD-10-CM

## 2024-03-26 DIAGNOSIS — I10 Essential (primary) hypertension: Secondary | ICD-10-CM

## 2024-03-26 NOTE — Progress Notes (Signed)
 Medicare Wellness question were answered 03/25/2024.

## 2024-03-26 NOTE — Progress Notes (Signed)
 Established Patient Office Visit     CC/Reason for Visit: Annual preventive exam and subsequent Medicare wellness  HPI: Kara Warren is a 68 y.o. female who is coming in today for the above mentioned reasons. Past Medical History is significant for: Depression, hypertension, A-fib.  Has been doing well without acute concerns or complaints.  Has routine eye and dental care.  All immunizations and cancer screenings are up-to-date.   Past Medical/Surgical History: Past Medical History:  Diagnosis Date   Abdominal pain, left upper quadrant    Acute bronchospasm    Acute neck pain 07/02/2019   Acute sinusitis, unspecified 05/20/2009   Centricity Description: SINUSITIS - ACUTE-NOS Qualifier: Diagnosis of  By: Mahlon MD, Comer   Centricity Description: ACUTE SINUSITIS, UNSPECIFIED Qualifier: Diagnosis of  By: Desiderio MD, Eugene   Centricity Description: SINUSITIS, ACUTE Qualifier: Diagnosis of  By: Charlott MD, Reyes     Anxiety    Atypical chest pain 03/05/2013   Body mass index (BMI) of 23.0-23.9 in adult    Cervicalgia    Colon polyp    Costochondral junction syndrome    Cough    DEPRESSION 08/25/2008   Qualifier: Diagnosis of  By: Joshua CMA, Chemira     Diarrhea    Diverticulosis 07/27/2012   Dorsalgia    Dysrhythmia    hx a-fib   Elevated blood pressure reading without diagnosis of hypertension    Endometrial polyp 05/2007   benign   Essential hypertension 09/20/2020   Family history of osteoporosis 12/28/2011   Mother pt had normal Dexa 11/2009    Fatigue    GERD (gastroesophageal reflux disease)    HIP PAIN, RIGHT 04/12/2009   Qualifier: Diagnosis of  By: HARVEY MD, KARL     History of hiatal hernia    HYPERLIPIDEMIA 08/25/2008   Qualifier: Diagnosis of  By: Mahlon MD, Comer     IBS (irritable bowel syndrome)    Left hip pain 07/30/2013   Left wrist pain 07/30/2013   Lumbar strain, initial encounter 07/28/2019   LUNG NODULE 11/19/2009   Neg CT      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 Please review for accuracy.   Otalgia, unspecified ear    Pain in throat    Palpitations    Paroxysmal atrial fibrillation (HCC)    Peroneal tendinitis of right lower extremity 03/12/2014   Plantar wart 04/02/2014   Pneumonia    RHINITIS 06/21/2009   Qualifier: Diagnosis of  By: Mahlon MD, Comer     Right ankle injury, subsequent encounter 01/11/2018   Right foot pain 08/09/2011   Sinusitis, chronic    Skene's gland abscess 2009   patient unaware   Sprain of unspecified ligament of right ankle, subsequent encounter    TRANSAMINASES, SERUM, ELEVATED 12/07/2009   Qualifier: Diagnosis of  By: Daryl FNP, Melissa S    Unspecified cataract    Urinary tract infection, site not specified    Vitamin D  deficiency  Past Surgical History:  Procedure Laterality Date   ATRIAL FIBRILLATION ABLATION N/A 08/01/2022   Procedure: ATRIAL FIBRILLATION ABLATION;  Surgeon: Inocencio Soyla Lunger, MD;  Location: [facility];  Service: Cardiovascular;  Laterality: N/A;   BROW LIFT Bilateral 12/29/2021   Procedure: BLEPHAROPLASTY;  Surgeon: Marene Sieving, MD;  Location: [facility] OR;  Service: Plastics;  Laterality: Bilateral;   COLONOSCOPY     x several   DILATION AND CURETTAGE OF UTERUS  05/2007   GYNECOLOGIC CRYOSURGERY  1980   HYSTEROSCOPY WITH D & C N/A 03/30/2017   Procedure: DILATATION & CURETTAGE/HYSTEROSCOPY WITH ULTRASOUND GUIDANCE;  Surgeon: Cleotilde Ronal RAMAN, MD;   Location: [facility];  Service: Gynecology;  Laterality: N/A;   UPPER GI ENDOSCOPY     uterine ablation  2007    Social History:  reports that she quit smoking about 45 years ago. Her smoking use included cigarettes. She started smoking about 48 years ago. She has a 0.3 pack-year smoking history. She has never used smokeless tobacco. She reports current alcohol use of about 7.0 standard drinks of alcohol per week. She reports that she does not use drugs.  Allergies: Allergies  Allergen Reactions   Iohexol      Pt developed itching, redness, and hives after steroid injeciton (included lidocaine , kenalog , and Iohexol ). Unsure which one caused the reaction. Pt took 50 mg of benadryl prior to injection and did not have breakthrough reaction. Okay per Dr Derrill. AV, RN 11/12/23   Lidocaine      Pt developed itching, redness, and hives after steroid injeciton (included lidocaine , kenalog , and Iohexol ). Unsure which one caused the reaction. Pt will need 13 hr prep prior to any future Steroid injections. AV, RN   Augmentin  [Amoxicillin -Pot Clavulanate] Diarrhea    Family History:  Family History  Problem Relation Age of Onset   Colon polyps Father    Ulcerative colitis Father    CAD Father        Stent placement at age 74   Glaucoma Father    Hypertension Father    Colitis Father    Breast cancer Paternal Grandmother    Colon cancer Maternal Grandfather    Colon polyps Mother    Osteoporosis Mother    Thyroid  disease Mother        hypo   Glaucoma Mother    Diverticulosis Mother    Sudden death Neg Hx    Hyperlipidemia Neg Hx    Heart attack Neg Hx    Diabetes Neg Hx      Current Outpatient Medications:    apixaban  (ELIQUIS ) 5 MG TABS tablet, Take 1 tablet (5 mg total) by mouth 2 (two) times daily., Disp: 60 tablet, Rfl: 5   azelastine  (ASTELIN ) 0.1 % nasal spray, Place 2 sprays into both nostrils 2 (two) times daily as directed, Disp: 30 mL, Rfl: 12   Calcium Carbonate-Vitamin D   (CALTRATE 600+D PO), Take 1 tablet by mouth daily., Disp: , Rfl:    citalopram  (CELEXA ) 20 MG tablet, Take 1 tablet (20 mg total) by mouth daily. **need office visit**, Disp: 90 tablet, Rfl: 0   diltiazem  (CARDIZEM ) 30 MG tablet, Take 1 tablet (30 mg total) by mouth 2 (two) times daily as needed (for afib symptoms)., Disp: 180 tablet, Rfl: 3   ipratropium (ATROVENT ) 0.03 % nasal spray, Place 2 sprays into both nostrils every 12 (twelve) hours. As needed., Disp: 30 mL, Rfl: 0   levocetirizine (XYZAL) 5 MG tablet, Take 5 mg by mouth every evening., Disp: ,  Rfl:    Multiple Vitamins-Minerals (MULTI FOR HER 50+) TABS, Take 1 tablet by mouth daily., Disp: , Rfl:    Probiotic Product (PROBIOTIC PO), Take 1 capsule by mouth daily. Ultra Flora, Disp: , Rfl:    vitamin C (ASCORBIC ACID) 500 MG tablet, Take 500 mg by mouth daily., Disp: , Rfl:    cefdinir  (OMNICEF ) 300 MG capsule, Take 1 capsule (300 mg total) by mouth 2 (two) times daily., Disp: 20 capsule, Rfl: 0   diphenhydrAMINE-zinc acetate (BENADRYL) cream, Apply 1 Application topically 3 (three) times daily as needed for itching., Disp: , Rfl:    fluticasone  (FLONASE ) 50 MCG/ACT nasal spray, Place 1-2 sprays into both nostrils daily. (Patient taking differently: Place 1-2 sprays into both nostrils as needed for allergies or rhinitis.), Disp: 16 g, Rfl: 3   hydrOXYzine  (ATARAX ) 25 MG tablet, Take 1/2-1 tablet (12.5-25 mg total) by mouth every 8 (eight) hours as needed for itching., Disp: 30 tablet, Rfl: 0   predniSONE  (DELTASONE ) 20 MG tablet, Take 2 tablets daily with breakfast., Disp: 10 tablet, Rfl: 0  Review of Systems:  Negative unless indicated in HPI.   Physical Exam: Vitals:   03/26/24 1256  BP: 110/70  Pulse: 60  Temp: 98 F (36.7 C)  TempSrc: Oral  SpO2: 99%  Weight: 136 lb 11.2 oz (62 kg)  Height: 5' 4.5 (1.638 m)    Body mass index is 23.1 kg/m.   Physical Exam Vitals reviewed.  Constitutional:      General: She is not  in acute distress.    Appearance: Normal appearance. She is not ill-appearing, toxic-appearing or diaphoretic.  HENT:     Head: Normocephalic.     Right Ear: Tympanic membrane, ear canal and external ear normal. There is no impacted cerumen.     Left Ear: Tympanic membrane, ear canal and external ear normal. There is no impacted cerumen.     Nose: Nose normal.     Mouth/Throat:     Mouth: Mucous membranes are moist.     Pharynx: Oropharynx is clear. No oropharyngeal exudate or posterior oropharyngeal erythema.   Eyes:     General: No scleral icterus.       Right eye: No discharge.        Left eye: No discharge.     Conjunctiva/sclera: Conjunctivae normal.     Pupils: Pupils are equal, round, and reactive to light.   Neck:     Vascular: No carotid bruit.   Cardiovascular:     Rate and Rhythm: Normal rate and regular rhythm.     Pulses: Normal pulses.     Heart sounds: Normal heart sounds.  Pulmonary:     Effort: Pulmonary effort is normal. No respiratory distress.     Breath sounds: Normal breath sounds.  Abdominal:     General: Abdomen is flat. Bowel sounds are normal.     Palpations: Abdomen is soft.   Musculoskeletal:        General: Normal range of motion.     Cervical back: Normal range of motion.   Skin:    General: Skin is warm and dry.   Neurological:     General: No focal deficit present.     Mental Status: She is alert and oriented to person, place, and time. Mental status is at baseline.   Psychiatric:        Mood and Affect: Mood normal.        Behavior: Behavior normal.  Thought Content: Thought content normal.        Judgment: Judgment normal.    Subsequent Medicare wellness visit   1. Risk factors, based on past  M,S,F - Cardiac Risk Factors include: advanced age (>53men, >48 women)   2.  Physical activities: Dietary issues and exercise activities discussed:      3.  Depression/mood:  Flowsheet Row Office Visit from 01/24/2023 in [facility] Total Score 0     4.  ADL's:    03/25/2024    4:57 PM  In your present state of health, do you have any difficulty performing the following activities:  Hearing? 0  Vision? 0  Difficulty concentrating or making decisions? 0  Walking or climbing stairs? 0  Dressing or bathing? 0  Doing errands, shopping? 0  Preparing Food and eating ? N  Using the Toilet? N  In the past six months, have you accidently leaked urine? N  Do you have problems with loss of bowel control? N  Managing your Medications? N  Managing your Finances? N  Housekeeping or managing your Housekeeping? N     5.  Fall risk:     04/19/2022    1:29 PM 06/27/2022    3:10 PM 08/01/2022   11:24 AM 01/23/2023    4:39 PM 03/25/2024    4:59 PM  Fall Risk  Falls in the past year? 0 0  0 0  Was there an injury with Fall? 0 0  0 0  Fall Risk Category Calculator 0 0  0 0  Fall Risk Category (Retired) Low  Low      (RETIRED) Patient Fall Risk Level Low fall risk   Low fall risk     Patient at Risk for Falls Due to No Fall Risks No Fall Risks     Fall risk Follow up Falls evaluation completed  Falls evaluation completed   Falls evaluation completed Falls evaluation completed     Data saved with a previous flowsheet row definition     6.  Home safety: No problems identified   7.  Height weight, and visual acuity: height and weight as above, vision/hearing: Vision Screening   Right eye Left eye Both eyes  Without correction     With correction 20/25 20/25 20/25      8.  Counseling: Counseling given: Not Answered Tobacco comments: Former smoker 08/29/22    9. Lab orders based on risk factors: Laboratory update will be reviewed   10. Cognitive assessment:        03/25/2024    5:01 PM 01/23/2023    4:40 PM  6CIT Screen  What Year? 0 points 0 points  What month? 0 points 0 points  What time? 0 points 0 points  Count back from 20 0 points 0 points  Months in  reverse 0 points 0 points  Repeat phrase 0 points 0 points  Total Score 0 points 0 points     11. Screening: Patient provided with a written and personalized 5-10 year screening schedule in the AVS. Health Maintenance  Topic Date Due   Hepatitis C Screening  Never done   COVID-19 Vaccine (7 - 2024-25 season) 06/03/2023   Flu Shot  05/02/2024   Mammogram  07/18/2024   Medicare Annual Wellness Visit  03/26/2025   DTaP/Tdap/Td vaccine (4 - Td or Tdap) 10/07/2028   Colon Cancer Screening  01/22/2033   Pneumococcal Vaccine for age over 61  Completed  DEXA scan (bone density measurement)  Completed   Zoster (Shingles) Vaccine  Completed   Hepatitis B Vaccine  Aged Out   HPV Vaccine  Aged Out   Meningitis B Vaccine  Aged Out    64. Provider List Update: Patient Care Team    Relationship Specialty Notifications Start End  Theophilus Andrews, Tully GRADE, MD PCP - General Internal Medicine  06/30/21   Verlin Lonni BIRCH, MD PCP - Cardiology Cardiology Admissions 08/20/19   Inocencio Soyla Lunger, MD PCP - Electrophysiology Cardiology  04/12/22      13. Advance Directives: Does Patient Have a Medical Advance Directive?: Yes Type of Advance Directive: Healthcare Power of Attorney, Living will, Out of facility DNR (pink MOST or yellow form) Does patient want to make changes to medical advance directive?: No - Patient declined Copy of Healthcare Power of Attorney in Chart?: No - copy requested  14. Opioids: Patient is not on any opioid prescriptions and has no risk factors for a substance use disorder.   15.   Goals      Oral Health Maintained     Evidence-based guidance:  Assess and address barriers to oral health or dental care; consider attitude, knowledge, child's age, development, available support or role-modeling.  Identify oral health needs by review of risk screen.  Encourage meticulous routine oral care that includes wiping, brushing (with an electric toothbrush if  available), flossing and preventive care.  Encourage appropriate amount of fluoridated toothpaste (?osmear? until age 10, pea-sized amount ages 57 and older).  Promote age-appropriate use of xylitol-containing products throughout the day such as toothpaste, mouthwash, candies, mints or chewing gum.  Anticipate fluoride  supplementation either parent administered and/or professionally applied.  Encourage breastfeeding mothers to continue up to age 48 months.  Provide anticipatory guidance regarding interplay between oral health and disease.  Encourage use of dental mouthguards when participating in contact sports.  Assist patient or parent to obtain dental care.   Notes:      Protect My Health        - schedule and keep appointment for annual check-up    Why is this important?   Screening tests can find diseases early when they are easier to treat.  Your doctor or nurse will talk with you about which tests are important for you.  Getting shots for common diseases like the flu and shingles will help prevent them.     Notes: Patient planning on a vacation!          I have personally reviewed and noted the following in the patient's chart:   Medical and social history Use of alcohol, tobacco or illicit drugs  Current medications and supplements Functional ability and status Nutritional status Physical activity Advanced directives List of other physicians Hospitalizations, surgeries, and ER visits in previous 12 months Vitals Screenings to include cognitive, depression, and falls Referrals and appointments  In addition, I have reviewed and discussed with patient certain preventive protocols, quality metrics, and best practice recommendations. A written personalized care plan for preventive services as well as general preventive health recommendations were provided to patient.   Impression and Plan:  Medicare annual wellness visit, subsequent  Essential hypertension  Mixed  hyperlipidemia  PAF (paroxysmal atrial fibrillation) (HCC)   -Recommend routine eye and dental care. -Healthy lifestyle discussed in detail. -Labs to be updated today. -Prostate cancer screening: N/A Health Maintenance  Topic Date Due   Hepatitis C Screening  Never done   COVID-19 Vaccine (7 - 2024-25 season)  06/03/2023   Flu Shot  05/02/2024   Mammogram  07/18/2024   Medicare Annual Wellness Visit  03/26/2025   DTaP/Tdap/Td vaccine (4 - Td or Tdap) 10/07/2028   Colon Cancer Screening  01/22/2033   Pneumococcal Vaccine for age over 8  Completed   DEXA scan (bone density measurement)  Completed   Zoster (Shingles) Vaccine  Completed   Hepatitis B Vaccine  Aged Out   HPV Vaccine  Aged Out   Meningitis B Vaccine  Aged Out       Kristene Liberati Theophilus Andrews, MD [facility]

## 2024-04-01 ENCOUNTER — Ambulatory Visit (HOSPITAL_COMMUNITY): Admitting: Licensed Clinical Social Worker

## 2024-04-02 ENCOUNTER — Other Ambulatory Visit: Payer: Self-pay

## 2024-04-02 ENCOUNTER — Other Ambulatory Visit (HOSPITAL_COMMUNITY): Payer: Self-pay

## 2024-04-02 ENCOUNTER — Other Ambulatory Visit: Payer: Self-pay | Admitting: Internal Medicine

## 2024-04-02 DIAGNOSIS — F330 Major depressive disorder, recurrent, mild: Secondary | ICD-10-CM

## 2024-04-02 MED ORDER — CITALOPRAM HYDROBROMIDE 20 MG PO TABS
20.0000 mg | ORAL_TABLET | Freq: Every day | ORAL | 1 refills | Status: DC
  Filled 2024-04-02 – 2024-05-08 (×2): qty 90, 90d supply, fill #0
  Filled 2024-08-02: qty 90, 90d supply, fill #1

## 2024-04-03 ENCOUNTER — Ambulatory Visit (HOSPITAL_COMMUNITY): Admitting: Licensed Clinical Social Worker

## 2024-04-03 ENCOUNTER — Other Ambulatory Visit (HOSPITAL_COMMUNITY): Payer: Self-pay

## 2024-04-29 ENCOUNTER — Encounter (HOSPITAL_COMMUNITY): Payer: Self-pay | Admitting: Licensed Clinical Social Worker

## 2024-04-29 ENCOUNTER — Ambulatory Visit (INDEPENDENT_AMBULATORY_CARE_PROVIDER_SITE_OTHER): Admitting: Licensed Clinical Social Worker

## 2024-04-29 DIAGNOSIS — F4321 Adjustment disorder with depressed mood: Secondary | ICD-10-CM

## 2024-04-29 NOTE — Progress Notes (Signed)
   THERAPIST PROGRESS NOTE  Session Time: 12:30pm-1:25pm  Participation Level: Active  Behavioral Response: Well GroomedAlertEuthymic  Type of Therapy: Individual Therapy  Treatment Goals addressed: [name] will identify patterns in relationships that are related to her level of self esteem and make choices more in line with higher levels of self esteem and self-compassion   ProgressTowards Goals: Progressing  Interventions: Motivational Interviewing  Summary: Kara Warren is a 68 y.o. female who presents with Adjustment Disorder with depressed mood.   Suicidal/Homicidal: Nowithout intent/plan  Therapist Response: Aerilynn engaged well in individual in person session with clinician. Clinician utilized MI OARS to reflect and summarize thoughts, feelings, and interactions. Clinician processed recent birthday and her decision to throw herself a bday party with her new friends. Clinician reflected the process as well as the actual event, which went well. [name] shared that she has been working on her confidence level, which is reportedly much higher over the past several months. [name] processed ways to address a challenging friendship with clinician. Clinician provided feedback and offered ways to give a gentle confrontation using the compliment sandwich.   Plan: Return again in 3 weeks.  Diagnosis: Adjustment disorder with depressed mood  Collaboration of Care: Patient refused AEB none required  Patient/Guardian was advised Release of Information must be obtained prior to any record release in order to collaborate their care with an outside provider. Patient/Guardian was advised if they have not already done so to contact the registration department to sign all necessary forms in order for us  to release information regarding their care.   Consent: Patient/Guardian gives verbal consent for treatment and assignment of benefits for services provided during this visit. Patient/Guardian expressed  understanding and agreed to proceed.   [name] [place], LCSW 04/29/2024

## 2024-05-05 ENCOUNTER — Other Ambulatory Visit (HOSPITAL_COMMUNITY): Payer: Self-pay

## 2024-05-08 ENCOUNTER — Other Ambulatory Visit (HOSPITAL_COMMUNITY): Payer: Self-pay

## 2024-05-09 ENCOUNTER — Other Ambulatory Visit (HOSPITAL_COMMUNITY): Payer: Self-pay

## 2024-05-16 ENCOUNTER — Encounter: Payer: Self-pay | Admitting: Family Medicine

## 2024-05-21 ENCOUNTER — Other Ambulatory Visit: Payer: Self-pay

## 2024-05-21 DIAGNOSIS — M5416 Radiculopathy, lumbar region: Secondary | ICD-10-CM

## 2024-05-21 DIAGNOSIS — M5412 Radiculopathy, cervical region: Secondary | ICD-10-CM

## 2024-05-21 NOTE — Telephone Encounter (Signed)
 Yes please

## 2024-05-22 ENCOUNTER — Encounter (HOSPITAL_COMMUNITY): Payer: Self-pay

## 2024-05-22 ENCOUNTER — Ambulatory Visit (HOSPITAL_COMMUNITY): Admitting: Licensed Clinical Social Worker

## 2024-05-26 ENCOUNTER — Telehealth: Payer: Self-pay

## 2024-05-26 ENCOUNTER — Other Ambulatory Visit: Payer: Self-pay | Admitting: Family Medicine

## 2024-05-26 DIAGNOSIS — M5412 Radiculopathy, cervical region: Secondary | ICD-10-CM

## 2024-05-26 DIAGNOSIS — M5416 Radiculopathy, lumbar region: Secondary | ICD-10-CM

## 2024-05-26 NOTE — Telephone Encounter (Signed)
   Pre-operative Risk Assessment    Patient Name: Kara Warren  DOB: [birth year] MRN: [id number]   Date of last office visit: 10/15/23 DAPHNE BARRACK, NP Date of next office visit: NONE   Request for Surgical Clearance    Procedure:  LUMBAR / CAMCAL EPIDURAL  Date of Surgery:  Clearance TBD                                Surgeon:  NOT INDICATED Surgeon's Group or Practice Name:  [facility] Phone number:  [phone] Fax number:  [phone]   Type of Clearance Requested:   - Medical  - Pharmacy:  Hold Apixaban  (Eliquis ) 4 DOSES BEFORE   Type of Anesthesia:  Not Indicated   Additional requests/questions:    Signed, Lucie DELENA Ku   05/26/2024, 5:39 PM

## 2024-05-28 ENCOUNTER — Telehealth: Payer: Self-pay

## 2024-05-28 DIAGNOSIS — M5416 Radiculopathy, lumbar region: Secondary | ICD-10-CM | POA: Insufficient documentation

## 2024-05-28 NOTE — Telephone Encounter (Signed)
   Name: Kara Warren  DOB: June 25, 1956  MRN: [id number]  Primary Cardiologist: Lonni Cash, MD   Preoperative team, please contact this patient and set up a phone call appointment for further preoperative risk assessment. Please obtain consent and complete medication review. Thank you for your help.  I confirm that guidance regarding antiplatelet and oral anticoagulation therapy has been completed and, if necessary, noted below.  Per office protocol, patient can hold Eliquis  for 3 days prior to procedure.   I also confirmed the patient resides in the state of [place] . As per [facility] Medical Board telemedicine laws, the patient must reside in the state in which the provider is licensed.   Wyn Raddle, Jackee Shove, NP 05/28/2024, 9:16 AM [facility]

## 2024-05-28 NOTE — Telephone Encounter (Signed)
 Patient with diagnosis of A Fib on Eliquis  for anticoagulation.    Procedure: LUMBAR / CAMCAL EPIDURAL  Date of procedure: TBD   CHA2DS2-VASc Score = 3  This indicates a 3.2% annual risk of stroke. The patient's score is based upon: CHF History: 0 HTN History: 1 Diabetes History: 0 Stroke History: 0 Vascular Disease History: 0 Age Score: 1 Gender Score: 1     CrCl 72 ml/min Platelet count 327K   Per office protocol, patient can hold Eliquis  for 3 days prior to procedure.    **This guidance is not considered finalized until pre-operative APP has relayed final recommendations.**

## 2024-05-28 NOTE — Telephone Encounter (Signed)
 Med Rec and Consent done     Patient Consent for Virtual Visit        Kara Warren has provided verbal consent on 05/28/2024 for a virtual visit (video or telephone).   CONSENT FOR VIRTUAL VISIT FOR:  Kara Warren  By participating in this virtual visit I agree to the following:  I hereby voluntarily request, consent and authorize [facility] and its employed or contracted physicians, physician assistants, nurse practitioners or other licensed health care professionals (the Practitioner), to provide me with telemedicine health care services (the "Services) as deemed necessary by the treating Practitioner. I acknowledge and consent to receive the Services by the Practitioner via telemedicine. I understand that the telemedicine visit will involve communicating with the Practitioner through live audiovisual communication technology and the disclosure of certain medical information by electronic transmission. I acknowledge that I have been given the opportunity to request an in-person assessment or other available alternative prior to the telemedicine visit and am voluntarily participating in the telemedicine visit.  I understand that I have the right to withhold or withdraw my consent to the use of telemedicine in the course of my care at any time, without affecting my right to future care or treatment, and that the Practitioner or I may terminate the telemedicine visit at any time. I understand that I have the right to inspect all information obtained and/or recorded in the course of the telemedicine visit and may receive copies of available information for a reasonable fee.  I understand that some of the potential risks of receiving the Services via telemedicine include:  Delay or interruption in medical evaluation due to technological equipment failure or disruption; Information transmitted may not be sufficient (e.g. poor resolution of images) to allow for appropriate medical  decision making by the Practitioner; and/or  In rare instances, security protocols could fail, causing a breach of personal health information.  Furthermore, I acknowledge that it is my responsibility to provide information about my medical history, conditions and care that is complete and accurate to the best of my ability. I acknowledge that Practitioner's advice, recommendations, and/or decision may be based on factors not within their control, such as incomplete or inaccurate data provided by me or distortions of diagnostic images or specimens that may result from electronic transmissions. I understand that the practice of medicine is not an exact science and that Practitioner makes no warranties or guarantees regarding treatment outcomes. I acknowledge that a copy of this consent can be made available to me via my patient portal [facility] MyChart), or I can request a printed copy by calling the office of  [facility].    I understand that my insurance will be billed for this visit.   I have read or had this consent read to me. I understand the contents of this consent, which adequately explains the benefits and risks of the Services being provided via telemedicine.  I have been provided ample opportunity to ask questions regarding this consent and the Services and have had my questions answered to my satisfaction. I give my informed consent for the services to be provided through the use of telemedicine in my medical care

## 2024-05-28 NOTE — Telephone Encounter (Signed)
 S/W pt and scheduled TELE preop appt 06/05/24. Med Rec and Consent done

## 2024-06-03 ENCOUNTER — Ambulatory Visit (HOSPITAL_COMMUNITY): Admitting: Licensed Clinical Social Worker

## 2024-06-03 ENCOUNTER — Encounter (HOSPITAL_COMMUNITY): Payer: Self-pay | Admitting: Licensed Clinical Social Worker

## 2024-06-03 DIAGNOSIS — F4321 Adjustment disorder with depressed mood: Secondary | ICD-10-CM

## 2024-06-03 NOTE — Progress Notes (Signed)
   THERAPIST PROGRESS NOTE  Session Time: 12:30pm-1:25pm  Participation Level: Active  Behavioral Response: Well GroomedAlertEuthymic  Type of Therapy: Individual Therapy  Treatment Goals addressed: Kara Warren will identify patterns in relationships that are related to her level of self esteem and make choices more in line with higher levels of self esteem and self-compassion   ProgressTowards Goals: Progressing  Interventions: Motivational Interviewing  Summary: Kara Warren is a 68 y.o. female who presents with Adjustment Disorder with depressed mood.   Suicidal/Homicidal: Nowithout intent/plan  Therapist Response: Kara Warren engaged well in individual in person session with clinician. Clinician utilized MI OARS to reflect and summarize thoughts, feelings, and interactions. Kara Warren shared updates about her newly realized independence, more activities, and new friendships. Clinician reflected the joy and excitement about a new friend in the neighborhood that seems to be quite vivacious and tenacious, values that Kara Warren. Clinician identified options for taking day trips if she does feel able to leave for a long weekend or overnight. Clinician also discussed concerns about mother's health decline. Clinician processed thoughts and feelings about the inevitable. Clinician provided time and space for Kara Warren to share her feelings about this.   Plan: Return again in 4 weeks.  Diagnosis: Adjustment disorder with depressed mood  Collaboration of Care: Patient refused AEB none required  Patient/Guardian was advised Release of Information must be obtained prior to any record release in order to collaborate their care with an outside provider. Patient/Guardian was advised if they have not already done so to contact the registration department to sign all necessary forms in order for us  to release information regarding their care.   Consent: Patient/Guardian gives verbal consent for treatment and  assignment of benefits for services provided during this visit. Patient/Guardian expressed understanding and agreed to proceed.   Kara SAUNDERS [place], LCSW 06/03/2024

## 2024-06-05 ENCOUNTER — Ambulatory Visit: Attending: Cardiology | Admitting: Student

## 2024-06-05 ENCOUNTER — Other Ambulatory Visit: Payer: Self-pay | Admitting: *Deleted

## 2024-06-05 DIAGNOSIS — Z01810 Encounter for preprocedural cardiovascular examination: Secondary | ICD-10-CM | POA: Diagnosis not present

## 2024-06-05 DIAGNOSIS — M25519 Pain in unspecified shoulder: Secondary | ICD-10-CM

## 2024-06-05 DIAGNOSIS — M542 Cervicalgia: Secondary | ICD-10-CM

## 2024-06-05 NOTE — Progress Notes (Signed)
 Virtual Visit via Telephone Note   Because of Darlette A Warren's co-morbid illnesses, she is at least at moderate risk for complications without adequate follow up.  This format is felt to be most appropriate for this patient at this time.  The patient did not have access to video technology/had technical difficulties with video requiring transitioning to audio format only (telephone).  All issues noted in this document were discussed and addressed.  No physical exam could be performed with this format.  Please refer to the patient's chart for her consent to telehealth for [facility].  Evaluation Performed:  Preoperative cardiovascular risk assessment _____________   Date:  06/05/2024   Patient ID:  Kara Warren, DOB 24-Jun-1956, MRN [id number] Patient Location:  Home Provider location:   Office  Primary Care Provider:  Theophilus Andrews, Tully GRADE, MD Primary Cardiologist:  Lonni Cash, MD  Chief Complaint / Patient Profile   68 y.o. y/o female with a h/o PAF s/p A-fib ablation October 2023 on anticoagulation, hypertension, hyperlipidemia, migraines who is pending lumbar epidural by [facility] and presents today for telephonic preoperative cardiovascular risk assessment.  History of Present Illness    Kara Warren is a 68 y.o. female who presents via audio/video conferencing for a telehealth visit today.  Pt was last seen in cardiology clinic on 10/15/2023 by Daphne Barrack, NP.  At that time JAQUAY MORNEAULT was stable from a cardiac standpoint.  The patient is now pending procedure as outlined above. Since her last visit, she is doing well. Patient denies shortness of breath, dyspnea on exertion, lower extremity edema, orthopnea or PND. No chest pain, pressure, or tightness. No palpitations.  She is not experiencing lightheadedness, dizziness, presyncope or syncope. She is very active. She walks for exercise, goes to pilates class 3 days a week, and plays  pickle ball.   Past Medical History    Past Medical History:  Diagnosis Date   Abdominal pain, left upper quadrant    Acute bronchospasm    Acute neck pain 07/02/2019   Acute sinusitis, unspecified 05/20/2009   Centricity Description: SINUSITIS - ACUTE-NOS Qualifier: Diagnosis of  By: Mahlon MD, Comer   Centricity Description: ACUTE SINUSITIS, UNSPECIFIED Qualifier: Diagnosis of  By: Desiderio MD, Eugene   Centricity Description: SINUSITIS, ACUTE Qualifier: Diagnosis of  By: Charlott MD, Reyes     Anxiety    Atypical chest pain 03/05/2013   Body mass index (BMI) of 23.0-23.9 in adult    Cervicalgia    Colon polyp    Costochondral junction syndrome    Cough    DEPRESSION 08/25/2008   Qualifier: Diagnosis of  By: Joshua CMA, Chemira     Diarrhea    Diverticulosis 07/27/2012   Dorsalgia    Dysrhythmia    hx a-fib   Elevated blood pressure reading without diagnosis of hypertension    Endometrial polyp 05/2007   benign   Essential hypertension 09/20/2020   Family history of osteoporosis 12/28/2011   Mother pt had normal Dexa 11/2009    Fatigue    GERD (gastroesophageal reflux disease)    HIP PAIN, RIGHT 04/12/2009   Qualifier: Diagnosis of  By: HARVEY MD, KARL     History of hiatal hernia    HYPERLIPIDEMIA 08/25/2008   Qualifier: Diagnosis of  By: Mahlon MD, Comer     IBS (irritable bowel syndrome)    Left hip pain 07/30/2013   Left wrist pain 07/30/2013   Lumbar strain, initial encounter 07/28/2019  LUNG NODULE 11/19/2009   Neg CT     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 Please review for accuracy.   Otalgia, unspecified ear    Pain in throat    Palpitations    Paroxysmal atrial fibrillation (HCC)    Peroneal tendinitis of right lower extremity 03/12/2014   Plantar wart 04/02/2014   Pneumonia    RHINITIS 06/21/2009   Qualifier: Diagnosis of  By: Mahlon MD, Comer     Right ankle injury, subsequent encounter 01/11/2018   Right foot pain 08/09/2011   Sinusitis, chronic    Skene's gland abscess 2009   patient unaware   Sprain of unspecified ligament of right ankle, subsequent encounter    TRANSAMINASES, SERUM, ELEVATED 12/07/2009   Qualifier: Diagnosis of  By: Daryl FNP, Melissa S    Unspecified cataract    Urinary tract infection, site not specified    Vitamin D  deficiency    Past Surgical History:  Procedure Laterality Date   ATRIAL FIBRILLATION ABLATION N/A 08/01/2022   Procedure: ATRIAL FIBRILLATION ABLATION;  Surgeon: Inocencio Soyla Lunger, MD;  [facility]: [facility];  Service: Cardiovascular;  Laterality: N/A;   BROW LIFT Bilateral 12/29/2021   Procedure: BLEPHAROPLASTY;  Surgeon: Marene Sieving, MD;  Location: [facility] OR;  Service: Plastics;  Laterality: Bilateral;   COLONOSCOPY     x several   DILATION AND CURETTAGE OF UTERUS  05/2007   GYNECOLOGIC CRYOSURGERY  1980   HYSTEROSCOPY WITH D & C N/A 03/30/2017   Procedure: DILATATION & CURETTAGE/HYSTEROSCOPY WITH ULTRASOUND GUIDANCE;  Surgeon: Cleotilde Ronal RAMAN, MD;  Location: [facility] OR[facility];  Service: Gynecology;  Laterality: N/A;   UPPER GI ENDOSCOPY     uterine ablation  2007    Allergies  Allergies  Allergen Reactions   Iohexol      Pt developed itching, redness, and hives after steroid injeciton (included lidocaine , kenalog , and Iohexol ). Unsure which one caused the reaction. Pt took 50 mg of benadryl prior to injection and did not have breakthrough reaction. Okay per Dr Derrill. AV, RN 11/12/23    Lidocaine      Pt developed itching, redness, and hives after steroid injeciton (included lidocaine , kenalog , and Iohexol ). Unsure which one caused the reaction. Pt will need 13 hr prep prior to any future Steroid injections. AV, RN   Augmentin  [Amoxicillin -Pot Clavulanate] Diarrhea    Home Medications    Prior to Admission medications   Medication Sig Start Date End Date Taking? Authorizing Provider  apixaban  (ELIQUIS ) 5 MG TABS tablet Take 1 tablet (5 mg total) by mouth 2 (two) times daily. 01/21/24   Camnitz, Soyla Lunger, MD  azelastine  (ASTELIN ) 0.1 % nasal spray Place 2 sprays into both nostrils 2 (two) times daily as directed 02/27/24   Theophilus Andrews, Tully GRADE, MD  Calcium Carbonate-Vitamin D  (CALTRATE 600+D PO) Take 1 tablet by mouth daily.    [provider]  citalopram  (CELEXA ) 20 MG tablet Take 1 tablet (20 mg total) by mouth daily.  04/02/24   Theophilus Andrews, Tully GRADE, MD  diltiazem  (CARDIZEM ) 30 MG tablet Take 1 tablet (30 mg total) by mouth 2 (two) times daily as needed (for afib symptoms). 10/15/23   Aniceto Daphne CROME, NP  ipratropium (ATROVENT ) 0.03 % nasal spray Place 2 sprays into both nostrils every 12 (twelve) hours. As needed. 06/23/22   Crain, Whitney L, PA  levocetirizine (XYZAL) 5 MG tablet Take 5 mg by mouth every evening.    [provider]  Multiple Vitamins-Minerals (MULTI FOR HER 50+) TABS Take 1 tablet by mouth daily.    [provider]  Probiotic Product (PROBIOTIC PO) Take 1 capsule by mouth daily. Ultra Flora    [provider]  vitamin C (ASCORBIC ACID) 500 MG tablet Take 500 mg by mouth daily.    [provider]    Physical Exam    Vital Signs:  ZAINEB NOWACZYK does not have vital signs available for review today.  Given telephonic nature of communication, physical exam is limited. AAOx3. NAD. Normal affect.  Speech and respirations are unlabored.   Assessment & Plan    Primary Cardiologist: Lonni Cash, MD  Preoperative cardiovascular risk assessment.  Lumbar epidural by [facility] imaging.  Chart reviewed as part of pre-operative protocol coverage. According to the RCRI, patient has a 0.4% risk of MACE. Patient reports activity equivalent to >4.0 METS (walks, pilates 3 days a week, plays pickle ball).   Given past medical history and time since last visit, based on ACC/AHA guidelines, CARTINA BROUSSEAU would be at acceptable risk for the planned procedure without further cardiovascular testing.   Patient was advised that if she develops new symptoms prior to surgery to contact our office to arrange a follow-up appointment.  she verbalized understanding.  Per Pharm D, patient may hold Eliquis  for 3 days prior to procedure.    I will route this recommendation to the requesting party via Epic fax function.  Please call with questions.  Time:   Today, I have spent 5 minutes with the patient with telehealth technology discussing medical history, symptoms, and management plan.     Barnie Hila, NP  06/05/2024, 8:14 AM

## 2024-06-11 ENCOUNTER — Ambulatory Visit: Admitting: Family Medicine

## 2024-06-11 ENCOUNTER — Encounter: Payer: Self-pay | Admitting: Family Medicine

## 2024-06-11 VITALS — BP 102/72 | Ht 64.5 in | Wt 138.0 lb

## 2024-06-11 DIAGNOSIS — S29012A Strain of muscle and tendon of back wall of thorax, initial encounter: Secondary | ICD-10-CM | POA: Diagnosis not present

## 2024-06-11 NOTE — Progress Notes (Signed)
 PCP: Theophilus Andrews, Tully GRADE, MD  Subjective:   HPI: Patient is a 68 y.o. female here for right shoulder pain.  Patient returns with different pain posterior right shoulder medial to scapula. Had been playing pickleball but has been resting for over a week. Tried heat and tylenol  which help. Is starting physical therapy tomorrow. Has ESI schedule for the cervical spine in about 2 weeks, lumbar spine 2 weeks after that.  Past Medical History:  Diagnosis Date   Abdominal pain, left upper quadrant    Acute bronchospasm    Acute neck pain 07/02/2019   Acute sinusitis, unspecified 05/20/2009   Centricity Description: SINUSITIS - ACUTE-NOS Qualifier: Diagnosis of  By: Mahlon MD, Comer   Centricity Description: ACUTE SINUSITIS, UNSPECIFIED Qualifier: Diagnosis of  By: Desiderio MD, Eugene   Centricity Description: SINUSITIS, ACUTE Qualifier: Diagnosis of  By: Charlott MD, Reyes     Anxiety    Atypical chest pain 03/05/2013   Body mass index (BMI) of 23.0-23.9 in adult    Cervicalgia    Colon polyp    Costochondral junction syndrome    Cough    DEPRESSION 08/25/2008   Qualifier: Diagnosis of  By: Joshua CMA, Chemira     Diarrhea    Diverticulosis 07/27/2012   Dorsalgia    Dysrhythmia    hx a-fib   Elevated blood pressure reading without diagnosis of hypertension    Endometrial polyp 05/2007   benign   Essential hypertension 09/20/2020   Family history of osteoporosis 12/28/2011   Mother pt had normal Dexa 11/2009    Fatigue    GERD (gastroesophageal reflux disease)    HIP PAIN, RIGHT 04/12/2009   Qualifier: Diagnosis of  By: HARVEY MD, KARL     History of hiatal hernia    HYPERLIPIDEMIA 08/25/2008   Qualifier: Diagnosis of  By: Mahlon MD, Comer     IBS (irritable bowel syndrome)    Left hip pain 07/30/2013   Left wrist pain 07/30/2013   Lumbar strain, initial encounter 07/28/2019   LUNG NODULE 11/19/2009   Neg CT     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 Please review for accuracy.   Otalgia, unspecified ear    Pain in throat    Palpitations    Paroxysmal atrial fibrillation (HCC)    Peroneal tendinitis of right lower extremity 03/12/2014   Plantar wart 04/02/2014   Pneumonia    RHINITIS 06/21/2009   Qualifier: Diagnosis of  By: Mahlon MD, Comer     Right ankle injury, subsequent encounter 01/11/2018   Right foot pain 08/09/2011   Sinusitis, chronic    Skene's gland abscess 2009   patient unaware   Sprain of unspecified ligament of right ankle, subsequent encounter    TRANSAMINASES, SERUM, ELEVATED 12/07/2009   Qualifier: Diagnosis of  By: Daryl FNP, Melissa S    Unspecified cataract    Urinary tract infection, site not specified    Vitamin D  deficiency     Current  Outpatient Medications on File Prior to Visit  Medication Sig Dispense Refill   apixaban  (ELIQUIS ) 5 MG TABS tablet Take 1 tablet (5 mg total) by mouth 2 (two) times daily. 60 tablet 5   azelastine  (ASTELIN ) 0.1 % nasal spray Place 2 sprays into both nostrils 2 (two) times daily as directed 30 mL 12   Calcium Carbonate-Vitamin D  (CALTRATE 600+D PO) Take 1 tablet by mouth daily.     citalopram  (CELEXA ) 20 MG tablet Take 1 tablet (20 mg total) by mouth daily. 90 tablet 1   diltiazem  (CARDIZEM ) 30 MG tablet Take 1 tablet (30 mg total) by mouth 2 (two) times daily as needed (for afib symptoms). 180 tablet 3   ipratropium (ATROVENT ) 0.03 % nasal spray Place 2 sprays into both nostrils every 12  (twelve) hours. As needed. 30 mL 0   levocetirizine (XYZAL) 5 MG tablet Take 5 mg by mouth every evening.     Multiple Vitamins-Minerals (MULTI FOR HER 50+) TABS Take 1 tablet by mouth daily.     Probiotic Product (PROBIOTIC PO) Take 1 capsule by mouth daily. Ultra Flora     vitamin C (ASCORBIC ACID) 500 MG tablet Take 500 mg by mouth daily.     No current facility-administered medications on file prior to visit.    Past Surgical History:  Procedure Laterality Date   ATRIAL FIBRILLATION ABLATION N/A 08/01/2022   Procedure: ATRIAL FIBRILLATION ABLATION;  Surgeon: Inocencio Soyla Lunger, MD;  Location: [facility];  Service: Cardiovascular;  Laterality: N/A;   BROW LIFT Bilateral 12/29/2021   Procedure: BLEPHAROPLASTY;  Surgeon: Marene Sieving, MD;  Location: [facility] OR;  Service: Plastics;  Laterality: Bilateral;   COLONOSCOPY     x several   DILATION AND CURETTAGE OF UTERUS  05/2007   GYNECOLOGIC CRYOSURGERY  1980   HYSTEROSCOPY WITH D & C N/A 03/30/2017   Procedure: DILATATION & CURETTAGE/HYSTEROSCOPY WITH ULTRASOUND GUIDANCE;  Surgeon: Cleotilde Ronal RAMAN, MD;  Location: [facility] OR[facility];  Service: Gynecology;  Laterality: N/A;   UPPER GI ENDOSCOPY     uterine ablation  2007    Allergies  Allergen Reactions   Iohexol      Pt developed itching, redness, and hives after steroid injeciton (included lidocaine , kenalog , and Iohexol ). Unsure which one caused the reaction. Pt took 50 mg of benadryl prior to injection and did not have breakthrough reaction. Okay per Dr Derrill. AV, RN 11/12/23   Lidocaine      Pt developed itching, redness, and hives after steroid injeciton (included lidocaine , kenalog , and Iohexol ). Unsure which one caused the reaction. Pt will need 13 hr prep prior to any future Steroid injections. AV, RN   Augmentin  [Amoxicillin -Pot Clavulanate] Diarrhea    BP 102/72   Ht 5' 4.5 (1.638 m)   Wt 138 lb (62.6 kg)   LMP 10/02/2006 (Approximate)   BMI 23.32 kg/m      08/30/2020     9:03 AM 06/08/2021    9:34 AM 08/22/2021   10:20 AM 11/09/2021   10:19 AM  [facility]  Frequency of aerobic exercise (# of days/week) 7 7 7 6   Average time in minutes 60 60 60 60  Frequency of strengthening activities (# of days/week) 3 3 2 3         No data to display              Objective:  Physical Exam:  Gen: NAD, comfortable in exam room  Right shoulder: No swelling, ecchymoses.  No  gross deformity.  No winging. TTP right rhomboids medial to scapula with spasm. FROM. Negative Hawkins, Neers. Negative Yergasons. Strength 5/5 with empty can and resisted internal/external rotation. Negative apprehension. NV intact distally.   Assessment & Plan:  1. Right shoulder pain - current pain due to strain of rhomboids on the right side.  Starting physical therapy tomorrow.  Heat, tylenol  as needed.  Home exercises.  Follow up in 5-6 weeks.

## 2024-06-11 NOTE — Patient Instructions (Addendum)
 You have strained your right rhomboids. Focus on this in physical therapy and do home exercises/stretches daily. Continue tylenol  as needed. Heat 15 minutes at a time 3-4 times a day. Follow up with me in 5-6 weeks.

## 2024-06-12 DIAGNOSIS — M25519 Pain in unspecified shoulder: Secondary | ICD-10-CM | POA: Diagnosis not present

## 2024-06-12 DIAGNOSIS — M542 Cervicalgia: Secondary | ICD-10-CM | POA: Diagnosis not present

## 2024-06-13 ENCOUNTER — Other Ambulatory Visit: Payer: Self-pay

## 2024-06-16 ENCOUNTER — Other Ambulatory Visit (HOSPITAL_COMMUNITY): Payer: Self-pay

## 2024-06-17 ENCOUNTER — Ambulatory Visit (HOSPITAL_COMMUNITY): Admitting: Licensed Clinical Social Worker

## 2024-06-18 ENCOUNTER — Inpatient Hospital Stay: Admission: RE | Admit: 2024-06-18 | Source: Ambulatory Visit

## 2024-06-18 DIAGNOSIS — M25519 Pain in unspecified shoulder: Secondary | ICD-10-CM | POA: Diagnosis not present

## 2024-06-18 DIAGNOSIS — M542 Cervicalgia: Secondary | ICD-10-CM | POA: Diagnosis not present

## 2024-06-20 ENCOUNTER — Telehealth: Payer: Self-pay

## 2024-06-20 NOTE — Telephone Encounter (Signed)
 Phone call to patient to review instructions for 13 hr prep for epidural injection with CT contrast on 06/23/24 at 0800. Pt only took Benadryl 50mg  1 hour prior to appt time with last injection due to afib history and fear of using the prednisone . This was approved last time by Dr. Derrill. Pt was reminded to take Benadryl 50mg  PO 1 hour prior to appt.

## 2024-06-23 ENCOUNTER — Ambulatory Visit
Admission: RE | Admit: 2024-06-23 | Discharge: 2024-06-23 | Disposition: A | Discharge status: Home / Self Care | Source: Ambulatory Visit | Attending: Family Medicine | Admitting: Family Medicine

## 2024-06-23 DIAGNOSIS — M5412 Radiculopathy, cervical region: Secondary | ICD-10-CM

## 2024-06-23 DIAGNOSIS — M47812 Spondylosis without myelopathy or radiculopathy, cervical region: Secondary | ICD-10-CM | POA: Diagnosis not present

## 2024-06-23 MED ORDER — IOPAMIDOL (ISOVUE-M 300) INJECTION 61%
15.0000 mL | Freq: Once | INTRAMUSCULAR | Status: AC | PRN
  Administered 2024-06-23: 15 mL via INTRATHECAL

## 2024-06-23 MED ORDER — TRIAMCINOLONE ACETONIDE 40 MG/ML IJ SUSP (RADIOLOGY)
60.0000 mg | Freq: Once | INTRAMUSCULAR | Status: AC
  Administered 2024-06-23: 60 mg via EPIDURAL

## 2024-06-23 NOTE — Discharge Instructions (Addendum)
 Post Procedure Spinal Discharge Instruction Sheet  You may resume a regular diet and any medications that you routinely take (including pain medications) unless otherwise noted by MD.  No driving day of procedure.  Light activity throughout the rest of the day.  Do not do any strenuous work, exercise, bending or lifting.  The day following the procedure, you can resume normal physical activity but you should refrain from exercising or physical therapy for at least three days thereafter.  You may apply ice to the injection site, 20 minutes on, 20 minutes off, as needed. Do not apply ice directly to skin.    Common Side Effects:  Headaches- take your usual medications as directed by your physician.  Increase your fluid intake.  Caffeinated beverages may be helpful.  Lie flat in bed until your headache resolves.  Restlessness or inability to sleep- you may have trouble sleeping for the next few days.  Ask your referring physician if you need any medication for sleep.  Facial flushing or redness- should subside within a few days.  Increased pain- a temporary increase in pain a day or two following your procedure is not unusual.  Take your pain medication as prescribed by your referring physician.  Leg cramps  Please contact our office at [phone] for the following symptoms: Fever greater than 100 degrees. Headaches unresolved with medication after 2-3 days. Increased swelling, pain, or redness at injection site.   Thank you for visiting [facility] today.   **YOU MAY RESUME YOUR ELIQUIS  IN 24 HOURS**

## 2024-07-01 ENCOUNTER — Other Ambulatory Visit: Payer: Self-pay

## 2024-07-01 ENCOUNTER — Encounter: Payer: Self-pay | Admitting: Family Medicine

## 2024-07-01 ENCOUNTER — Ambulatory Visit (HOSPITAL_COMMUNITY): Admitting: Licensed Clinical Social Worker

## 2024-07-01 DIAGNOSIS — R0789 Other chest pain: Secondary | ICD-10-CM

## 2024-07-01 NOTE — Progress Notes (Signed)
 Received adverse determination from insurance company for her cervical epidural injection.  She has done well with these in the past.  Reason for adverse determination stated shots only needed if patient not a candidate for surgery and doctor is aware of her prolonged steroid use.  However patient has seen neurosurgeon Dr. Malcolm and she does not want to have surgery, Dr. Malcolm stated she is not a candidate at this time (message 05/20/24).  I am aware of her steroid usage and have been the ordering provider for these injections.

## 2024-07-02 ENCOUNTER — Other Ambulatory Visit: Payer: Self-pay | Admitting: Family Medicine

## 2024-07-02 ENCOUNTER — Ambulatory Visit
Admission: RE | Admit: 2024-07-02 | Discharge: 2024-07-02 | Disposition: A | Source: Ambulatory Visit | Attending: Family Medicine | Admitting: Family Medicine

## 2024-07-02 DIAGNOSIS — R0789 Other chest pain: Secondary | ICD-10-CM

## 2024-07-02 DIAGNOSIS — R0781 Pleurodynia: Secondary | ICD-10-CM | POA: Diagnosis not present

## 2024-07-04 ENCOUNTER — Other Ambulatory Visit

## 2024-07-04 ENCOUNTER — Other Ambulatory Visit (HOSPITAL_COMMUNITY): Payer: Self-pay

## 2024-07-04 MED ORDER — FLUZONE HIGH-DOSE 0.5 ML IM SUSY
0.5000 mL | PREFILLED_SYRINGE | Freq: Once | INTRAMUSCULAR | 0 refills | Status: AC
  Filled 2024-07-04: qty 0.5, 1d supply, fill #0

## 2024-07-07 ENCOUNTER — Ambulatory Visit: Payer: Self-pay | Admitting: Family Medicine

## 2024-07-07 NOTE — Discharge Instructions (Signed)

## 2024-07-08 ENCOUNTER — Inpatient Hospital Stay
Admission: RE | Admit: 2024-07-08 | Discharge: 2024-07-08 | Disposition: A | Discharge status: Home / Self Care | Source: Ambulatory Visit | Attending: Family Medicine | Admitting: Family Medicine

## 2024-07-12 DIAGNOSIS — M25519 Pain in unspecified shoulder: Secondary | ICD-10-CM | POA: Diagnosis not present

## 2024-07-13 ENCOUNTER — Other Ambulatory Visit (HOSPITAL_COMMUNITY): Payer: Self-pay

## 2024-07-22 ENCOUNTER — Ambulatory Visit (HOSPITAL_COMMUNITY): Admitting: Licensed Clinical Social Worker

## 2024-07-23 NOTE — Discharge Instructions (Signed)

## 2024-07-24 ENCOUNTER — Ambulatory Visit
Admission: RE | Admit: 2024-07-24 | Discharge: 2024-07-24 | Disposition: A | Discharge status: Home / Self Care | Source: Ambulatory Visit | Attending: Family Medicine | Admitting: Family Medicine

## 2024-07-24 DIAGNOSIS — M5126 Other intervertebral disc displacement, lumbar region: Secondary | ICD-10-CM | POA: Diagnosis not present

## 2024-07-24 DIAGNOSIS — M5416 Radiculopathy, lumbar region: Secondary | ICD-10-CM

## 2024-07-24 MED ORDER — METHYLPREDNISOLONE ACETATE 40 MG/ML INJ SUSP (RADIOLOG
80.0000 mg | Freq: Once | INTRAMUSCULAR | Status: AC
  Administered 2024-07-24: 80 mg via EPIDURAL

## 2024-07-24 MED ORDER — IOPAMIDOL (ISOVUE-M 200) INJECTION 41%
1.0000 mL | Freq: Once | INTRAMUSCULAR | Status: AC
  Administered 2024-07-24: 1 mL via EPIDURAL

## 2024-07-30 ENCOUNTER — Ambulatory Visit (INDEPENDENT_AMBULATORY_CARE_PROVIDER_SITE_OTHER): Admitting: Licensed Clinical Social Worker

## 2024-07-30 ENCOUNTER — Encounter (HOSPITAL_COMMUNITY): Payer: Self-pay | Admitting: Licensed Clinical Social Worker

## 2024-07-30 DIAGNOSIS — F4321 Adjustment disorder with depressed mood: Secondary | ICD-10-CM

## 2024-07-30 NOTE — Progress Notes (Signed)
 Virtual Visit via Video Note  I connected with [name] on 07/30/24 at  4:30 PM EDT by a video enabled telemedicine application and verified that I am speaking with the correct person using two identifiers.  Location: Patient: home Provider: home office   I discussed the limitations of evaluation and management by telemedicine and the availability of in person appointments. The patient expressed understanding and agreed to proceed.     I discussed the assessment and treatment plan with the patient. The patient was provided an opportunity to ask questions and all were answered. The patient agreed with the plan and demonstrated an understanding of the instructions.   The patient was advised to call back or seek an in-person evaluation if the symptoms worsen or if the condition fails to improve as anticipated.  I provided 55 minutes of non-face-to-face time during this encounter.   [name], LCSW   THERAPIST PROGRESS NOTE  Session Time: 4:30pm-5:25pm  Participation Level: Active  Behavioral Response: Well GroomedAlertEuthymic  Type of Therapy: Individual Therapy  Treatment Goals addressed: Makinze will identify patterns in relationships that are related to her level of self esteem and make choices more in line with higher levels of self esteem and self-compassion   ProgressTowards Goals: Progressing  Interventions: Supportive  Summary: DARLIN STENSETH is a 68 y.o. female who presents with Adjustment Disorder with depressed mood.   Suicidal/Homicidal: Nowithout intent/plan  Therapist Response: Porshea engaged well in individual virtual session with facilities manager. Clinician utilized Supportive therapy to process interactions, plans, and thought processes. Clinician explored activities and goals for her next phase of life. Shaynna shared preparation to end her caregiving era and step into something new. Clinician identified changes in attitude about her responsibility  for others. Clinician validated this change and encouraged Levaeh to practice self-compassion and to let go of guilt. Clinician identified the likelihood that she was raised with guilt and that this guilt has kept her from setting boundaries in the past, ending relationships, changing decisions, etc. Clinician noted the importance of making her own decisions based on what she wants to do and not what others expect of her. Aundra shared that she has been really focused on having healthy boundaries with her friends and family.   Plan: Return again in 6 weeks.  Diagnosis: Adjustment disorder with depressed mood  Collaboration of Care: Patient refused AEB none required  Patient/Guardian was advised Release of Information must be obtained prior to any record release in order to collaborate their care with an outside provider. Patient/Guardian was advised if they have not already done so to contact the registration department to sign all necessary forms in order for us  to release information regarding their care.   Consent: Patient/Guardian gives verbal consent for treatment and assignment of benefits for services provided during this visit. Patient/Guardian expressed understanding and agreed to proceed.   Harlene JONELLE [place], LCSW 07/30/2024

## 2024-08-11 ENCOUNTER — Other Ambulatory Visit: Payer: Self-pay | Admitting: Cardiology

## 2024-08-11 DIAGNOSIS — I480 Paroxysmal atrial fibrillation: Secondary | ICD-10-CM

## 2024-08-12 ENCOUNTER — Other Ambulatory Visit (HOSPITAL_COMMUNITY): Payer: Self-pay

## 2024-08-12 MED ORDER — APIXABAN 5 MG PO TABS
5.0000 mg | ORAL_TABLET | Freq: Two times a day (BID) | ORAL | 5 refills | Status: AC
  Filled 2024-08-12: qty 60, 30d supply, fill #0
  Filled 2024-09-09: qty 60, 30d supply, fill #1
  Filled 2024-10-09: qty 60, 30d supply, fill #2

## 2024-08-12 NOTE — Telephone Encounter (Signed)
 Prescription refill request for Eliquis  received. Indication:afib Last office visit:9/25 Scr:0.72  6/25 Age: 68 Weight:62.6  kg  Prescription refilled

## 2024-09-02 ENCOUNTER — Ambulatory Visit (HOSPITAL_COMMUNITY): Admitting: Licensed Clinical Social Worker

## 2024-09-02 DIAGNOSIS — F4321 Adjustment disorder with depressed mood: Secondary | ICD-10-CM

## 2024-09-08 ENCOUNTER — Encounter (HOSPITAL_COMMUNITY): Payer: Self-pay | Admitting: Licensed Clinical Social Worker

## 2024-09-08 NOTE — Progress Notes (Signed)
   THERAPIST PROGRESS NOTE  Session Time: 11:00am-11:55am  Participation Level: Active  Behavioral Response: Well GroomedAlertEuthymic  Type of Therapy: Individual Therapy  Treatment Goals addressed: Viveca will identify patterns in relationships that are related to her level of self esteem and make choices more in line with higher levels of self esteem and self-compassion   ProgressTowards Goals: Progressing  Interventions: Other: grief counseling  Summary: ADDALYNN KUMARI is a 68 y.o. female who presents with Adjustment disorder with depressed mood and grief.   Suicidal/Homicidal: Nowithout intent/plan  Therapist Response: Defne engaged well in individual in person session with clinician. Clinician utilized grief counseling to process the recent death of her mother. Clinician identified grief theories and the nature of grief as a back and forth between grieving and recovering. Clinician explored Nuri's experience with managing the details of mother's estate, belongings, planning the funeral, etc. Clinician reflected the strength and motivation to do what she felt would be mother's wishes. Clinician identified the importance of her own self-confidence in this experience, as she had to essentially make all the decisions, calls, and write the obituary herself. Clinician also normalized the secondary sense of relief that mother is not in pain anymore and that Kambre will be able to really care for herself now.   Plan: Return again in 2-4 weeks.  Diagnosis: Adjustment disorder with depressed mood  Grief  Collaboration of Care: Patient refused AEB none required  Patient/Guardian was advised Release of Information must be obtained prior to any record release in order to collaborate their care with an outside provider. Patient/Guardian was advised if they have not already done so to contact the registration department to sign all necessary forms in order for us  to release information regarding  their care.   Consent: Patient/Guardian gives verbal consent for treatment and assignment of benefits for services provided during this visit. Patient/Guardian expressed understanding and agreed to proceed.   [name] [place], LCSW 09/08/2024

## 2024-09-09 ENCOUNTER — Other Ambulatory Visit: Payer: Self-pay | Admitting: Internal Medicine

## 2024-09-09 ENCOUNTER — Other Ambulatory Visit: Payer: Self-pay

## 2024-09-09 DIAGNOSIS — F330 Major depressive disorder, recurrent, mild: Secondary | ICD-10-CM

## 2024-09-10 ENCOUNTER — Other Ambulatory Visit (HOSPITAL_COMMUNITY): Payer: Self-pay

## 2024-09-10 ENCOUNTER — Other Ambulatory Visit: Payer: Self-pay | Admitting: Medical Genetics

## 2024-09-10 MED ORDER — CITALOPRAM HYDROBROMIDE 20 MG PO TABS
20.0000 mg | ORAL_TABLET | Freq: Every day | ORAL | 1 refills | Status: AC
  Filled 2024-09-10 – 2024-10-19 (×2): qty 90, 90d supply, fill #0

## 2024-09-11 NOTE — Progress Notes (Signed)
 Kara Warren                                          MRN: [id number]   09/11/2024   The [facility] Quality Team Specialist reviewed this patient medical record for the purposes of chart review for care gap closure. The following were reviewed: abstraction for care gap closure-controlling blood pressure.    [facility] Quality Team

## 2024-09-16 ENCOUNTER — Other Ambulatory Visit (HOSPITAL_BASED_OUTPATIENT_CLINIC_OR_DEPARTMENT_OTHER): Payer: Self-pay | Admitting: Internal Medicine

## 2024-09-16 ENCOUNTER — Encounter (HOSPITAL_BASED_OUTPATIENT_CLINIC_OR_DEPARTMENT_OTHER): Payer: Self-pay | Admitting: Radiology

## 2024-09-16 ENCOUNTER — Inpatient Hospital Stay (HOSPITAL_BASED_OUTPATIENT_CLINIC_OR_DEPARTMENT_OTHER): Admission: RE | Admit: 2024-09-16 | Discharge: 2024-09-16

## 2024-09-16 DIAGNOSIS — Z1231 Encounter for screening mammogram for malignant neoplasm of breast: Secondary | ICD-10-CM | POA: Insufficient documentation

## 2024-10-07 ENCOUNTER — Encounter (HOSPITAL_COMMUNITY): Payer: Self-pay | Admitting: Licensed Clinical Social Worker

## 2024-10-07 ENCOUNTER — Encounter (HOSPITAL_COMMUNITY): Payer: Self-pay

## 2024-10-07 ENCOUNTER — Ambulatory Visit (HOSPITAL_COMMUNITY): Admitting: Licensed Clinical Social Worker

## 2024-10-07 DIAGNOSIS — F4321 Adjustment disorder with depressed mood: Secondary | ICD-10-CM | POA: Diagnosis not present

## 2024-10-07 NOTE — Progress Notes (Signed)
" ° °  THERAPIST PROGRESS NOTE  Session Time: 11:00am-11:55am  Participation Level: Active  Behavioral Response: Well GroomedAlertEuthymic  Type of Therapy: Individual Therapy  Treatment Goals addressed:  Active     Depression CCP Problem  1 Self esteem     Hester will identify patterns in relationships that are related to her level of self esteem and make choices more in line with higher levels of self esteem and self-compassion    REVIEW PLEASE SKILLS (TREAT PHYSICAL ILLNESS, BALANCE EATING, AVOID MOOD-ALTERING SUBSTANCES, BALANCE SLEEP AND GET EXERCISE) WITH Kara Warren     Start:  03/21/22         REVIEW PLEASANT ACTIVITIES LIST WITH Kara Warren AND SELECT 3 ACTIVITIES TO PRACTICE WEEKLY FOR THE NEXT 4 WEEKS     Start:  03/21/22            ProgressTowards Goals: Progressing  Interventions: Motivational Interviewing  Summary: Kara Warren is a 69 y.o. female who presents with Adjustment Disorder with depressed mood.   Suicidal/Homicidal: Nowithout intent/plan  Therapist Response: Kara Warren engaged well in individual in person session with clinician. Clinician utilized MI OARS to reflect and summarize thoughts, feelings, and interactions. Clinician explored interactions and noted that [name] has reconnected with her ex-partner, who seems to be making different, more serious steps to having a stable relationship. Clinician reviewed past hx of problems in this relationship and encouraged [name] to be a more empowered partner, more specific with her wants and needs, and with a higher sense of self esteem in order to maintain her expectations.   Plan: Return again in 2-3 weeks.  Diagnosis: Adjustment disorder with depressed mood  Collaboration of Care: Patient refused AEB none required  Patient/Guardian was advised Release of Information must be obtained prior to any record release in order to collaborate their care with an outside provider. Patient/Guardian was advised if they have not already  done so to contact the registration department to sign all necessary forms in order for us  to release information regarding their care.   Consent: Patient/Guardian gives verbal consent for treatment and assignment of benefits for services provided during this visit. Patient/Guardian expressed understanding and agreed to proceed.   [name] [place], LCSW 10/07/2024  "

## 2024-10-09 ENCOUNTER — Other Ambulatory Visit (HOSPITAL_COMMUNITY): Payer: Self-pay

## 2024-10-14 ENCOUNTER — Encounter: Payer: Self-pay | Admitting: Cardiology

## 2024-10-19 ENCOUNTER — Ambulatory Visit
Admission: EM | Admit: 2024-10-19 | Discharge: 2024-10-19 | Disposition: A | Discharge status: Home / Self Care | Attending: Family Medicine | Admitting: Family Medicine

## 2024-10-19 DIAGNOSIS — M79644 Pain in right finger(s): Secondary | ICD-10-CM

## 2024-10-19 DIAGNOSIS — S61216A Laceration without foreign body of right little finger without damage to nail, initial encounter: Secondary | ICD-10-CM | POA: Diagnosis not present

## 2024-10-19 NOTE — ED Provider Notes (Signed)
 " [facility]  Note:  This document was prepared using Conservation officer, historic buildings and may include unintentional dictation errors.  MRN: [id number] DOB: 12/27/1955  Subjective:   Kara Warren is a 69 y.o. female presenting for right pinky finger laceration over the finger pad distally yesterday evening.  The wound is approximately 18 hours.  She is concerned because the area has not stopped bleeding.  She is on Eliquis  for paroxysmal atrial fibrillation.  Tetanus is up-to-date.  Current Outpatient Medications  Medication Instructions   ascorbic acid (VITAMIN C) 500 mg, Daily   azelastine  (ASTELIN ) 0.1 % nasal spray Place 2 sprays into both nostrils 2 (two) times daily as directed   Calcium Carbonate-Vitamin D  (CALTRATE 600+D PO) 1 tablet, Daily   citalopram  (CELEXA ) 20 mg, Oral, Daily   diltiazem  (CARDIZEM ) 30 mg, Oral, 2 times daily PRN   Eliquis  5 mg, Oral, 2 times daily   ipratropium (ATROVENT ) 0.03 % nasal spray 2 sprays, Each Nare, Every 12 hours, As needed.   levocetirizine (XYZAL) 5 mg, Every evening   Multiple Vitamins-Minerals (MULTI FOR HER 50+) TABS 1 tablet, Daily   Probiotic Product (PROBIOTIC PO) 1 capsule, Daily    Allergies[1]  Past Medical History:  Diagnosis Date   Abdominal pain, left upper quadrant    Acute bronchospasm    Acute neck pain 07/02/2019   Acute sinusitis, unspecified 05/20/2009   Centricity Description: SINUSITIS - ACUTE-NOS Qualifier: Diagnosis of  By: Mahlon MD, Comer   Centricity Description: ACUTE SINUSITIS, UNSPECIFIED Qualifier: Diagnosis of  By: Desiderio MD, Eugene   Centricity Description: SINUSITIS, ACUTE Qualifier: Diagnosis of  By: Charlott MD, Reyes     Anxiety    Atypical chest pain 03/05/2013   Body mass index (BMI) of 23.0-23.9 in adult    Cervicalgia    Colon polyp    Costochondral junction syndrome    Cough    DEPRESSION 08/25/2008   Qualifier: Diagnosis of  By: Joshua CMA, Chemira      Diarrhea    Diverticulosis 07/27/2012   Dorsalgia    Dysrhythmia    hx a-fib   Elevated blood pressure reading without diagnosis of hypertension    Endometrial polyp 05/2007   benign   Essential hypertension 09/20/2020   Family history of osteoporosis 12/28/2011   Mother pt had normal Dexa 11/2009    Fatigue    GERD (gastroesophageal reflux disease)    HIP PAIN, RIGHT 04/12/2009   Qualifier: Diagnosis of  By: HARVEY MD, KARL     History of hiatal hernia    HYPERLIPIDEMIA 08/25/2008   Qualifier: Diagnosis of  By: Mahlon MD, Comer     IBS (irritable bowel syndrome)    Left hip pain 07/30/2013   Left wrist pain 07/30/2013   Lumbar strain, initial encounter 07/28/2019   LUNG NODULE 11/19/2009   Neg CT     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 Please review for accuracy.   Otalgia,  unspecified ear    Pain in throat    Palpitations    Paroxysmal atrial fibrillation (HCC)    Peroneal tendinitis of right lower extremity 03/12/2014   Plantar wart 04/02/2014   Pneumonia    RHINITIS 06/21/2009   Qualifier: Diagnosis of  By: Mahlon MD, Comer     Right ankle injury, subsequent encounter 01/11/2018   Right foot pain 08/09/2011   Sinusitis, chronic    Skene's gland abscess 2009   patient unaware   Sprain of unspecified ligament of right ankle, subsequent encounter    TRANSAMINASES, SERUM, ELEVATED 12/07/2009   Qualifier: Diagnosis of  By: Daryl FNP, Melissa S    Unspecified cataract    Urinary  tract infection, site not specified    Vitamin D  deficiency      Past Surgical History:  Procedure Laterality Date   ATRIAL FIBRILLATION ABLATION N/A 08/01/2022   Procedure: ATRIAL FIBRILLATION ABLATION;  Surgeon: Inocencio Soyla Lunger, MD;  Location: [facility];  Service: Cardiovascular;  Laterality: N/A;   BROW LIFT Bilateral 12/29/2021   Procedure: BLEPHAROPLASTY;  Surgeon: Marene Sieving, MD;  Location: [facility] OR;  Service: Plastics;  Laterality: Bilateral;   COLONOSCOPY     x several   DILATION AND CURETTAGE OF UTERUS  05/2007   GYNECOLOGIC CRYOSURGERY  1980   HYSTEROSCOPY WITH D & C N/A 03/30/2017   Procedure: DILATATION & CURETTAGE/HYSTEROSCOPY WITH ULTRASOUND GUIDANCE;  Surgeon: Cleotilde Ronal RAMAN, MD;  Location: [facility];  Service: Gynecology;  Laterality: N/A;   UPPER GI ENDOSCOPY     uterine ablation  2007    Family History  Problem Relation Age of Onset   Colon polyps Father    Ulcerative colitis Father    CAD Father        Stent placement at age 34   Glaucoma Father    Hypertension Father    Colitis Father    Breast cancer Paternal Grandmother    Colon cancer Maternal Grandfather    Colon polyps Mother    Osteoporosis Mother    Thyroid  disease Mother        hypo   Glaucoma Mother    Diverticulosis Mother    Sudden death Neg Hx    Hyperlipidemia Neg Hx    Heart attack Neg Hx    Diabetes Neg Hx     Social History   Occupational History   Occupation: Teacher, Adult Education: [employer]  Tobacco Use   Smoking status: Former    Current packs/day: 0.00    Average packs/day: 0.1 packs/day for 3.0 years (0.3 ttl pk-yrs)    Types: Cigarettes    Start date: 10/03/1975    Quit date: 10/02/1978    Years since quitting: 46.0   Smokeless tobacco: Never   Tobacco comments:    Former smoker 08/29/22  Vaping Use   Vaping status: Never Used  Substance and Sexual Activity   Alcohol use: Yes    Alcohol/week: 7.0 standard drinks of alcohol    Types: 7 Glasses of wine per week     Comment: per week   Drug use: No   Sexual activity: Not Currently    Birth control/protection: Post-menopausal     ROS   Objective:   Vitals: BP 131/81 (BP Location: Left Arm)   Pulse (!) 58   Temp 97.9 F (36.6 C) (Oral)   Resp 16   LMP 10/02/2006   SpO2 97%   Physical Exam Constitutional:      General:  She is not in acute distress.    Appearance: Normal appearance. She is well-developed. She is not ill-appearing, toxic-appearing or diaphoretic.  HENT:     Head: Normocephalic and atraumatic.     Nose: Nose normal.     Mouth/Throat:     Mouth: Mucous membranes are moist.  Eyes:     General: No scleral icterus.       Right eye: No discharge.        Left eye: No discharge.     Extraocular Movements: Extraocular movements intact.  Cardiovascular:     Rate and Rhythm: Normal rate.  Pulmonary:     Effort: Pulmonary effort is normal.  Musculoskeletal:       Hands:  Skin:    General: Skin is warm and dry.  Neurological:     General: No focal deficit present.     Mental Status: She is alert and oriented to person, place, and time.  Psychiatric:        Mood and Affect: Mood normal.        Behavior: Behavior normal.    PROCEDURE NOTE: laceration repair Verbal consent obtained from patient.  Local anesthesia with 1cc Lidocaine  1% without epinephrine .  Wound explored for tendon, ligament damage. Wound scrubbed with soap and water  and rinsed. Wound closed with #2 5-0 Prolene (simple interrupted) sutures.  Wound cleansed and dressed.    Assessment and Plan :   PDMP not reviewed this encounter.  1. Finger pain, right   2. Laceration of right little finger without foreign body without damage to nail, initial encounter      Laceration repaired successfully. Wound care reviewed. Recommended Tylenol  and/or ibuprofen  for pain control. Return-to-clinic precautions discussed, patient verbalized understanding. Otherwise, follow up in 10 days for suture removal. Counseled  patient on potential for adverse effects with medications prescribed/recommended today, ER and return-to-clinic precautions discussed, patient verbalized understanding.     [1]  Allergies Allergen Reactions   Amoxicillin  Diarrhea   Clavulanic Acid Diarrhea   Iohexol      Pt developed itching, redness, and hives after steroid injeciton (included lidocaine , kenalog , and Iohexol ). Unsure which one caused the reaction. Pt took 50 mg of benadryl prior to injection and did not have breakthrough reaction. Okay per Dr Derrill. AV, RN 11/12/23   Lidocaine      Pt developed itching, redness, and hives after steroid injeciton (included lidocaine , kenalog , and Iohexol ). Unsure which one caused the reaction. Pt will need 13 hr prep prior to any future Steroid injections. AV, RN   Augmentin  [Amoxicillin -Pot Clavulanate] Diarrhea     Christopher [place], PA-C 10/22/24 0900  "

## 2024-10-19 NOTE — ED Triage Notes (Signed)
 Pt reports she cut the right pinky finger with a piece of aluminum piece of a container in the kitchen. Pt reports she put pressure and when she tried to removed the bandaid started bleeding again. Pt concern as she takes Eliquis .

## 2024-10-19 NOTE — Discharge Instructions (Addendum)
 WOUND CARE Please return in 10 days to have your stitches/staples removed or sooner if you have concerns.  Keep area clean and dry.   Tomorrow remove bandage and wash wound gently with mild soap and warm water . Reapply a new bandage after cleaning wound, if bleeding.  Continue daily cleansing with soap and water  until stitches/staples are removed.  Do not apply any ointments or creams to the wound while stitches/staples are in place, as this may cause delayed healing.  Notify the office if you experience any of the following signs of infection: Swelling, redness, pus drainage, streaking, fever >101.0 F  Notify the office if you experience excessive bleeding that does not stop after 15-20 minutes of constant, firm pressure.

## 2024-10-20 ENCOUNTER — Encounter: Payer: Self-pay | Admitting: Internal Medicine

## 2024-10-20 ENCOUNTER — Other Ambulatory Visit: Payer: Self-pay

## 2024-10-20 ENCOUNTER — Other Ambulatory Visit (HOSPITAL_COMMUNITY): Payer: Self-pay

## 2024-10-23 ENCOUNTER — Other Ambulatory Visit

## 2024-10-23 DIAGNOSIS — Z006 Encounter for examination for normal comparison and control in clinical research program: Secondary | ICD-10-CM

## 2024-10-28 ENCOUNTER — Ambulatory Visit (INDEPENDENT_AMBULATORY_CARE_PROVIDER_SITE_OTHER): Admitting: Licensed Clinical Social Worker

## 2024-10-28 DIAGNOSIS — F4321 Adjustment disorder with depressed mood: Secondary | ICD-10-CM | POA: Diagnosis not present

## 2024-10-29 ENCOUNTER — Encounter (HOSPITAL_COMMUNITY): Payer: Self-pay | Admitting: Licensed Clinical Social Worker

## 2024-10-29 NOTE — Progress Notes (Signed)
 Virtual Visit via Video Note  I connected with Kara Warren on 10/28/24 at 11:00 AM EST by a video enabled telemedicine application and verified that I am speaking with the correct person using two identifiers.  Location: Patient: home Provider: home office   I discussed the limitations of evaluation and management by telemedicine and the availability of in person appointments. The patient expressed understanding and agreed to proceed.    I discussed the assessment and treatment plan with the patient. The patient was provided an opportunity to ask questions and all were answered. The patient agreed with the plan and demonstrated an understanding of the instructions.   The patient was advised to call back or seek an in-person evaluation if the symptoms worsen or if the condition fails to improve as anticipated.  I provided 55 minutes of non-face-to-face time during this encounter.   [name], LCSW   THERAPIST PROGRESS NOTE  Session Time: 11:00am-11:55am  Participation Level: Active  Behavioral Response: Well GroomedAlertDysphoric  Type of Therapy: Individual Therapy  Treatment Goals addressed:  Active     Depression CCP Problem  1 Self esteem     Kara Warren will identify patterns in relationships that are related to her level of self esteem and make choices more in line with higher levels of self esteem and self-compassion    REVIEW PLEASE SKILLS (TREAT PHYSICAL ILLNESS, BALANCE EATING, AVOID MOOD-ALTERING SUBSTANCES, BALANCE SLEEP AND GET EXERCISE) WITH Kara Warren     Start:  03/21/22         REVIEW PLEASANT ACTIVITIES LIST WITH Kara Warren AND SELECT 3 ACTIVITIES TO PRACTICE WEEKLY FOR THE NEXT 4 WEEKS     Start:  03/21/22            ProgressTowards Goals: Progressing  Interventions: Solution Focused and Supportive  Summary: Kara Warren is a 69 y.o. female who presents with Adjustment Disorder with depressed mood, grief.   Suicidal/Homicidal: Nowithout  intent/plan  Therapist Response: Kara Warren engaged well in individual virtual session with facilities manager. Clinician utilized Solution Focused therapy and supportive counseling to process grief and preparation for mother's internment. Clinician discussed the challenges of deciding who was invited and who should not be invited. Clinician validated her concerns and reminded Kara Warren that she gets to decide and she does not have to be concerned about the feelings of others when it comes to this choice. Clinician processed relationship with partner and noted that he has been very supportive. Clinician reviewed grief process and encouraged Kara Warren to allow the feelings to come and go with ease and no judgement.   Plan: Return again in 2-4 weeks.  Diagnosis: Adjustment disorder with depressed mood  Grief  Collaboration of Care: Patient refused AEB none required  Patient/Guardian was advised Release of Information must be obtained prior to any record release in order to collaborate their care with an outside provider. Patient/Guardian was advised if they have not already done so to contact the registration department to sign all necessary forms in order for us  to release information regarding their care.   Consent: Patient/Guardian gives verbal consent for treatment and assignment of benefits for services provided during this visit. Patient/Guardian expressed understanding and agreed to proceed.   Harlene JONELLE [place], LCSW 10/29/2024

## 2024-10-31 LAB — GENECONNECT MOLECULAR SCREEN: Genetic Analysis Overall Interpretation: NEGATIVE

## 2024-11-18 ENCOUNTER — Ambulatory Visit: Admitting: Pulmonary Disease

## 2024-11-25 ENCOUNTER — Ambulatory Visit (HOSPITAL_COMMUNITY): Admitting: Licensed Clinical Social Worker
